# Patient Record
Sex: Female | Born: 1950
Health system: Southern US, Community
[De-identification: ages and names within clinical notes are randomized; demographics above are authoritative.]

## PROBLEM LIST (undated history)

## (undated) DIAGNOSIS — I4891 Unspecified atrial fibrillation: Secondary | ICD-10-CM

## (undated) DIAGNOSIS — I639 Cerebral infarction, unspecified: Secondary | ICD-10-CM

## (undated) DIAGNOSIS — R011 Cardiac murmur, unspecified: Secondary | ICD-10-CM

## (undated) DIAGNOSIS — E538 Deficiency of other specified B group vitamins: Secondary | ICD-10-CM

## (undated) DIAGNOSIS — M1711 Unilateral primary osteoarthritis, right knee: Secondary | ICD-10-CM

## (undated) DIAGNOSIS — E785 Hyperlipidemia, unspecified: Secondary | ICD-10-CM

## (undated) DIAGNOSIS — Z860100 Personal history of colon polyps, unspecified: Secondary | ICD-10-CM

## (undated) DIAGNOSIS — E611 Iron deficiency: Secondary | ICD-10-CM

## (undated) DIAGNOSIS — R42 Dizziness and giddiness: Secondary | ICD-10-CM

## (undated) DIAGNOSIS — D649 Anemia, unspecified: Secondary | ICD-10-CM

## (undated) DIAGNOSIS — K219 Gastro-esophageal reflux disease without esophagitis: Secondary | ICD-10-CM

## (undated) DIAGNOSIS — I10 Essential (primary) hypertension: Secondary | ICD-10-CM

## (undated) DIAGNOSIS — I6529 Occlusion and stenosis of unspecified carotid artery: Secondary | ICD-10-CM

## (undated) DIAGNOSIS — M1990 Unspecified osteoarthritis, unspecified site: Secondary | ICD-10-CM

## (undated) HISTORY — PX: TUBAL LIGATION: SHX77

## (undated) HISTORY — DX: Iron deficiency: E61.1

## (undated) HISTORY — DX: Unspecified atrial fibrillation: I48.91

## (undated) HISTORY — DX: Cerebral infarction, unspecified: I63.9

## (undated) HISTORY — PX: CAROTID ENDARTERECTOMY: SUR193

## (undated) HISTORY — DX: Hyperlipidemia, unspecified: E78.5

## (undated) HISTORY — DX: Occlusion and stenosis of unspecified carotid artery: I65.29

## (undated) HISTORY — PX: DILATION AND CURETTAGE OF UTERUS: SHX78

## (undated) HISTORY — PX: ESOPHAGEAL DILATION: SHX303

---

## 2005-01-15 ENCOUNTER — Ambulatory Visit: Payer: Self-pay | Admitting: Family Medicine

## 2005-10-10 ENCOUNTER — Ambulatory Visit: Payer: Self-pay | Admitting: Family Medicine

## 2005-10-25 ENCOUNTER — Ambulatory Visit: Payer: Self-pay | Admitting: Unknown Physician Specialty

## 2006-11-12 ENCOUNTER — Ambulatory Visit: Payer: Self-pay | Admitting: Unknown Physician Specialty

## 2007-11-13 ENCOUNTER — Ambulatory Visit: Payer: Self-pay | Admitting: Unknown Physician Specialty

## 2008-11-14 ENCOUNTER — Ambulatory Visit: Payer: Self-pay | Admitting: Internal Medicine

## 2009-02-03 ENCOUNTER — Ambulatory Visit: Payer: Self-pay | Admitting: Family Medicine

## 2010-04-25 ENCOUNTER — Ambulatory Visit: Payer: Self-pay | Admitting: Unknown Physician Specialty

## 2010-05-17 ENCOUNTER — Ambulatory Visit: Payer: Self-pay | Admitting: Unknown Physician Specialty

## 2011-08-21 ENCOUNTER — Ambulatory Visit: Payer: Self-pay | Admitting: Unknown Physician Specialty

## 2012-04-11 ENCOUNTER — Ambulatory Visit: Payer: Self-pay | Admitting: Otolaryngology

## 2012-05-02 ENCOUNTER — Ambulatory Visit: Payer: Self-pay | Admitting: Gastroenterology

## 2012-05-30 ENCOUNTER — Ambulatory Visit: Payer: Self-pay | Admitting: Gastroenterology

## 2012-08-18 ENCOUNTER — Ambulatory Visit: Payer: Self-pay | Admitting: Gastroenterology

## 2012-08-18 HISTORY — PX: UPPER GI ENDOSCOPY: SHX6162

## 2012-08-18 HISTORY — PX: COLONOSCOPY: SHX174

## 2012-08-19 LAB — PATHOLOGY REPORT

## 2012-12-31 ENCOUNTER — Emergency Department: Payer: Self-pay | Admitting: Emergency Medicine

## 2012-12-31 ENCOUNTER — Inpatient Hospital Stay (HOSPITAL_COMMUNITY): Payer: BC Managed Care – PPO

## 2012-12-31 ENCOUNTER — Encounter (HOSPITAL_COMMUNITY): Payer: Self-pay | Admitting: *Deleted

## 2012-12-31 ENCOUNTER — Inpatient Hospital Stay (HOSPITAL_COMMUNITY)
Admission: AD | Admit: 2012-12-31 | Discharge: 2013-01-02 | DRG: 014 | Disposition: A | Discharge status: Home / Self Care | Payer: BC Managed Care – PPO | Source: Other Acute Inpatient Hospital | Attending: Internal Medicine | Admitting: Internal Medicine

## 2012-12-31 DIAGNOSIS — I6930 Unspecified sequelae of cerebral infarction: Secondary | ICD-10-CM | POA: Insufficient documentation

## 2012-12-31 DIAGNOSIS — I6521 Occlusion and stenosis of right carotid artery: Secondary | ICD-10-CM

## 2012-12-31 DIAGNOSIS — I6350 Cerebral infarction due to unspecified occlusion or stenosis of unspecified cerebral artery: Secondary | ICD-10-CM | POA: Insufficient documentation

## 2012-12-31 DIAGNOSIS — I6529 Occlusion and stenosis of unspecified carotid artery: Secondary | ICD-10-CM | POA: Diagnosis present

## 2012-12-31 DIAGNOSIS — I10 Essential (primary) hypertension: Secondary | ICD-10-CM | POA: Insufficient documentation

## 2012-12-31 DIAGNOSIS — K219 Gastro-esophageal reflux disease without esophagitis: Secondary | ICD-10-CM | POA: Insufficient documentation

## 2012-12-31 DIAGNOSIS — Z823 Family history of stroke: Secondary | ICD-10-CM

## 2012-12-31 DIAGNOSIS — I639 Cerebral infarction, unspecified: Secondary | ICD-10-CM | POA: Insufficient documentation

## 2012-12-31 DIAGNOSIS — E785 Hyperlipidemia, unspecified: Secondary | ICD-10-CM | POA: Diagnosis present

## 2012-12-31 HISTORY — DX: Cerebral infarction, unspecified: I63.9

## 2012-12-31 HISTORY — DX: Cardiac murmur, unspecified: R01.1

## 2012-12-31 HISTORY — DX: Gastro-esophageal reflux disease without esophagitis: K21.9

## 2012-12-31 HISTORY — DX: Essential (primary) hypertension: I10

## 2012-12-31 HISTORY — DX: Unspecified osteoarthritis, unspecified site: M19.90

## 2012-12-31 LAB — CBC WITH DIFFERENTIAL/PLATELET
Eosinophils Absolute: 0.1 10*3/uL (ref 0.0–0.7)
Eosinophils Relative: 2 % (ref 0–5)
Hemoglobin: 12.3 g/dL (ref 12.0–15.0)
Lymphs Abs: 1.8 10*3/uL (ref 0.7–4.0)
MCH: 29.3 pg (ref 26.0–34.0)
MCV: 87.4 fL (ref 78.0–100.0)
Monocytes Relative: 7 % (ref 3–12)
Neutrophils Relative %: 64 % (ref 43–77)
RBC: 4.2 MIL/uL (ref 3.87–5.11)

## 2012-12-31 LAB — COMPREHENSIVE METABOLIC PANEL
Anion Gap: 6 — ABNORMAL LOW (ref 7–16)
Chloride: 105 mmol/L (ref 98–107)
Co2: 28 mmol/L (ref 21–32)
Creatinine: 0.79 mg/dL (ref 0.60–1.30)
EGFR (African American): >60
EGFR (Non-African Amer.): >60
Glucose: 93 mg/dL (ref 65–99)
SGPT (ALT): 19 U/L (ref 12–78)

## 2012-12-31 LAB — PROTIME-INR
INR: 1
Prothrombin Time: 13.2 secs (ref 11.5–14.7)

## 2012-12-31 LAB — CBC
HGB: 12.6 g/dL (ref 12.0–16.0)
MCHC: 33.7 g/dL (ref 32.0–36.0)
RBC: 4.28 10*6/uL (ref 3.80–5.20)

## 2012-12-31 MED ORDER — SENNOSIDES-DOCUSATE SODIUM 8.6-50 MG PO TABS: 1.0000 | ORAL_TABLET | Freq: Every evening | ORAL | Status: DC | PRN

## 2012-12-31 MED ORDER — SODIUM CHLORIDE 0.9 % IV SOLN
INTRAVENOUS | Status: DC
  Administered 2013-01-01: via INTRAVENOUS

## 2012-12-31 MED ORDER — ACETAMINOPHEN 325 MG PO TABS
650.0000 mg | ORAL_TABLET | ORAL | Status: DC | PRN
  Administered 2012-12-31: 650 mg via ORAL
  Filled 2012-12-31: qty 2

## 2012-12-31 MED ORDER — TRAMADOL HCL 50 MG PO TABS: 50.0000 mg | ORAL_TABLET | Freq: Three times a day (TID) | ORAL | Status: DC | PRN

## 2012-12-31 MED ORDER — CLOPIDOGREL BISULFATE 75 MG PO TABS
75.0000 mg | ORAL_TABLET | Freq: Every day | ORAL | Status: DC
  Administered 2013-01-01 – 2013-01-02 (×2): 75 mg via ORAL
  Filled 2012-12-31 (×4): qty 1

## 2012-12-31 MED ORDER — HYDRALAZINE HCL 20 MG/ML IJ SOLN: 10.0000 mg | INTRAMUSCULAR | Status: DC | PRN

## 2012-12-31 NOTE — Consult Note (Addendum)
Referring Physician: Toniann Fail    Chief Complaint: Intermittent left sided symptoms  HPI: Cumi Juhasz is an 62 y.o. female who reports that she awakened today normal.  When she went to the bathroom this morning after getting out of bed noted that she was numb on the left.  This lasted for a few minutes and resolved spontaneously. The patient went about her day and while at work at about 12 noon noted that she was unable to use her left hand normally.  This as well resolved after a few minutes.  At 2:30 she noted that she was dragging her left foot.  This lasted a little longer than the previous events.  She estimates that it lasted about 5 minutes but resolved as well.  The patient was unable to reach her PCP and presented to the ED at [facility] for evaluation.  Work up there included a MRI of the brain that showed an acute infarct.  Patient was transferred here for further evaluation.   Patient reports that for the past two days has had a dull frontal headache.  There has bee no associated nausea, vomiting, photophobia or phonophobia.    Date last known well: Date: 12/31/2012 Time last known well: Time: 14:30 tPA Given: No: Symptoms resolved  Past Medical History  Diagnosis Date  . Hypertension   . Heart murmur   . GERD (gastroesophageal reflux disease)   . Arthritis     right knee    Past Surgical History  Procedure Laterality Date  . Tubal ligation      Family History  Problem Relation Age of Onset  . Stroke Mother   . Heart failure Father    Social History:  reports that she has never smoked. She does not have any smokeless tobacco history on file. She reports that she does not use illicit drugs. Her alcohol history is not on file.  Allergies: No Known Allergies  Medications: Calcium, Losartan, Protonix, Vitamin D  ROS: History obtained from the patient  General ROS: negative for - chills, fatigue, fever, night sweats, weight gain or weight loss Psychological ROS:  negative for - behavioral disorder, hallucinations, memory difficulties, mood swings or suicidal ideation Ophthalmic ROS: negative for - blurry vision, double vision, eye pain or loss of vision ENT ROS: negative for - epistaxis, nasal discharge, oral lesions, sore throat, tinnitus or vertigo Allergy and Immunology ROS: negative for - hives or itchy/watery eyes Hematological and Lymphatic ROS: negative for - bleeding problems, bruising or swollen lymph nodes Endocrine ROS: negative for - galactorrhea, hair pattern changes, polydipsia/polyuria or temperature intolerance Respiratory ROS: excessive snoring Cardiovascular ROS: negative for - chest pain, dyspnea on exertion, edema or irregular heartbeat Gastrointestinal ROS: negative for - abdominal pain, diarrhea, hematemesis, nausea/vomiting or stool incontinence Genito-Urinary ROS: negative for - dysuria, hematuria, incontinence or urinary frequency/urgency Musculoskeletal ROS: joint pain Neurological ROS: as noted in HPI Dermatological ROS: negative for rash and skin lesion changes  Physical Examination: Blood pressure 151/56, pulse 72, temperature 98.6 F (37 C), temperature source Oral, resp. rate 18, SpO2 99.00%.  Neurologic Examination: Mental Status: Alert, oriented, thought content appropriate.  Speech fluent without evidence of aphasia.  Able to follow 3 step commands without difficulty. Cranial Nerves: II: Discs flat bilaterally; Visual fields grossly normal, pupils equal, round, reactive to light and accommodation III,IV, VI: ptosis not present, extra-ocular motions intact bilaterally V,VII: smile symmetric, facial light touch sensation normal bilaterally VIII: hearing normal bilaterally IX,X: gag reflex present XI: bilateral shoulder shrug XII:  midline tongue extension Motor: Right : Upper extremity   5/5    Left:     Upper extremity   5/5  Lower extremity   5/5     Lower extremity   5/5 Tone and bulk:normal tone throughout;  no atrophy noted Sensory: Pinprick and light touch intact throughout, bilaterally Deep Tendon Reflexes: 2+ in the upper extremities, absent at the knees and at the left ankle.  1+at the right ankle.   Plantars: Right: downgoing   Left: downgoing Cerebellar: normal finger-to-nose and normal heel-to-shin test Gait: normal gait and station CV: pulses palpable throughout     Laboratory Studies:  Lab work reviewed from [employer] and unremarkable.     Imaging: Head CT (12/31/2012) - No acute changes.  Hyperdensity seen in the 4th ventricle, likely calcification.  MRI of the brain (12/31/2012) - Right posterior parietal acute, nonhemorrhagic infarcts.  No evidence of hemorrhage.    Assessment: 62 y.o. female RH female with intermittent left sided symptoms today that have resolved.  MRI of the brain performed at [facility] and reviewed.  Acute infarcts noted in the right posterior parietal lobe.  With head CT there was some question raised of forth ventricular hemorrhage but after review this likely represents calcification.  Patient has a history of HTN but no other risk factors.  She is on no antiplatelet therapy at home.    Stroke Risk Factors - hypertension  Plan: 1. HgbA1c, fasting lipid panel 2. Repeat head CT on 7/17 3. PT consult, OT consult evaluation 4. Echocardiogram 5. Carotid dopplers 6. Prophylactic therapy-Antiplatelet med: Plavix - dose 75mg  daily 7. Risk factor modification 8. Telemetry monitoring 9. Frequent neuro checks 10. Ultram prn for headache   Thana Farr, MD [facility] [phone] 12/31/2012, 10:39 PM

## 2012-12-31 NOTE — H&P (Signed)
[facility] History and Physical  Rosslyn Daddario WUJ:[id number] DOB: 09-Aug-1950 DOA: 12/31/2012  Referring physician: Patient was transferred from [facility]. PCP: Default, Provider, MD   Chief Complaint: Left-sided weakness.  HPI: Ellen Baker is a 62 y.o. female history of hypertension and GERD started experiencing left lower extremity numbness around 7 AM after waking up which lasted for around half an hour and resolved. Then about 2 hours later patient started having weakness in the left upper extremity which lasted for a few minutes and resolved. Then around 2:30 PM patient started developing foot drop-like feeling in the left foot which made her drag her foot when walking. Around 4:30 PM patient had gone to [facility] and CT head done there was showing possibility of hemorrhagic in the fourth ventricle. MRI of the brain was done which was showing acute punctate infarcts involving the right posterior parietal lobe. Patient has been transferred to call for further management after discussing with neurologist as patient may need a repeat CT head to make sure there was no bleed and in case neurosurgery is necessary. Over the last 2 days patient has been having some frontal headaches but denies any visual symptoms, difficulty swallowing or speaking and presently patient is able to walk without any difficulty and has good strength in all extremities. Denies any chest pain shortness of breath nausea vomiting abdominal pain. Patient states she has frequent bowel movements for many years.  Review of Systems: As presented in the history of presenting illness, rest negative.  Past Medical History  Diagnosis Date  . Hypertension   . Heart murmur   . GERD (gastroesophageal reflux disease)   . Arthritis     right knee   Past Surgical History  Procedure Laterality Date  . Tubal ligation     Social History:  reports that she has never smoked. She does not have any  smokeless tobacco history on file. She reports that she does not use illicit drugs. Her alcohol history is not on file. Home. where does patient live-- Can do ADLs. Can patient participate in ADLs?  No Known Allergies  Family History  Problem Relation Age of Onset  . Stroke Mother   . Heart failure Father       Prior to Admission medications   Not on File   Physical Exam: Filed Vitals:   12/31/12 2117  BP: 151/56  Pulse: 72  Temp: 98.6 F (37 C)  TempSrc: Oral  Resp: 18  SpO2: 99%     General:  Well-developed and nourished.  Eyes: Anicteric no pallor.  ENT: No discharge from the ears eyes nose mouth.  Neck: No mass felt.  Cardiovascular: S1-S2 heard.   Respiratory: No rhonchi or crepitations.  Abdomen: Soft nontender bowel sounds present.  Skin: No rash.  Musculoskeletal: No edema.  Psychiatric: Appears normal.  Neurologic: Alert awake oriented to time place and person. Moves all extremities 5 x 5. No facial asymmetry. No tongue deviation. PERRLA positive.  Labs on Admission:  Basic Metabolic Panel: No results found for this basename: NA, K, CL, CO2, GLUCOSE, BUN, CREATININE, CALCIUM, MG, PHOS,  in the last 168 hours Liver Function Tests: No results found for this basename: AST, ALT, ALKPHOS, BILITOT, PROT, ALBUMIN,  in the last 168 hours No results found for this basename: LIPASE, AMYLASE,  in the last 168 hours No results found for this basename: AMMONIA,  in the last 168 hours CBC: No results found for this basename: WBC, NEUTROABS, HGB, HCT,  MCV, PLT,  in the last 168 hours Cardiac Enzymes: No results found for this basename: CKTOTAL, CKMB, CKMBINDEX, TROPONINI,  in the last 168 hours  BNP (last 3 results) No results found for this basename: PROBNP,  in the last 8760 hours CBG: No results found for this basename: GLUCAP,  in the last 168 hours  Radiological Exams on Admission: No results found.   Assessment/Plan Principal Problem:   CVA  (cerebral infarction) Active Problems:   HTN (hypertension)   GERD (gastroesophageal reflux disease)   1. CVA - I did discuss with on-call neurologist Dr. Thad Ranger. At this time patient has been placed on neurochecks swallow evaluation and 2-D echo and carotid Doppler. Repeat neurological exams per neurologist. Antiplatelet agents as per neurologist. 2. Hypertension - continue present medications. 3. GERD - continue present medications.  Repeat labs chest x-ray and EKG are pending. Monitor shows sinus rhythm. Labs done at [facility] were unremarkable.    Code Status: Full code.  Family Communication: Family at the bedside.  Disposition Plan: Admit to inpatient.    KAKRAKANDY,ARSHAD N. [facility] Pager [phone].  If 7PM-7AM, please contact night-coverage [email or web address] Password [facility] 12/31/2012, 10:12 PM

## 2013-01-01 ENCOUNTER — Inpatient Hospital Stay (HOSPITAL_COMMUNITY): Payer: BC Managed Care – PPO

## 2013-01-01 ENCOUNTER — Encounter (HOSPITAL_COMMUNITY): Payer: Self-pay | Admitting: Radiology

## 2013-01-01 DIAGNOSIS — I6350 Cerebral infarction due to unspecified occlusion or stenosis of unspecified cerebral artery: Principal | ICD-10-CM

## 2013-01-01 LAB — COMPREHENSIVE METABOLIC PANEL
AST: 17 U/L (ref 0–37)
Albumin: 3.1 g/dL — ABNORMAL LOW (ref 3.5–5.2)
Calcium: 9 mg/dL (ref 8.4–10.5)
Creatinine, Ser: 0.69 mg/dL (ref 0.50–1.10)

## 2013-01-01 LAB — LIPID PANEL
Cholesterol: 201 mg/dL — ABNORMAL HIGH (ref 0–200)
HDL: 56 mg/dL (ref >39)
LDL Cholesterol: 126 mg/dL — ABNORMAL HIGH (ref 0–99)
Total CHOL/HDL Ratio: 3.6 RATIO
Triglycerides: 94 mg/dL (ref <150)
VLDL: 19 mg/dL (ref 0–40)

## 2013-01-01 LAB — CBC WITH DIFFERENTIAL/PLATELET
Eosinophils Absolute: 0.1 10*3/uL (ref 0.0–0.7)
Eosinophils Relative: 3 % (ref 0–5)
HCT: 33.8 % — ABNORMAL LOW (ref 36.0–46.0)
Hemoglobin: 11.5 g/dL — ABNORMAL LOW (ref 12.0–15.0)
Lymphs Abs: 1.7 10*3/uL (ref 0.7–4.0)
MCH: 29.6 pg (ref 26.0–34.0)
MCV: 87.1 fL (ref 78.0–100.0)
Monocytes Absolute: 0.4 10*3/uL (ref 0.1–1.0)
Monocytes Relative: 9 % (ref 3–12)
Platelets: 191 10*3/uL (ref 150–400)
RBC: 3.88 MIL/uL (ref 3.87–5.11)

## 2013-01-01 LAB — BASIC METABOLIC PANEL
CO2: 25 mEq/L (ref 19–32)
Calcium: 8.7 mg/dL (ref 8.4–10.5)
Chloride: 107 mEq/L (ref 96–112)
Glucose, Bld: 92 mg/dL (ref 70–99)
Sodium: 141 mEq/L (ref 135–145)

## 2013-01-01 MED ORDER — SIMVASTATIN 10 MG PO TABS
10.0000 mg | ORAL_TABLET | Freq: Every day | ORAL | Status: DC
  Administered 2013-01-01: 10 mg via ORAL
  Filled 2013-01-01 (×2): qty 1

## 2013-01-01 MED ORDER — IOHEXOL 350 MG/ML SOLN
50.0000 mL | Freq: Once | INTRAVENOUS | Status: AC | PRN
  Administered 2013-01-01: 50 mL via INTRAVENOUS

## 2013-01-01 NOTE — Evaluation (Signed)
Physical Therapy Evaluation Patient Details Name: Ellen Baker MRN: [id number] DOB: 10/18/1950 Today's Date: 01/01/2013 Time: 5621-3086 PT Time Calculation (min): 29 min  PT Assessment / Plan / Recommendation History of Present Illness  62 y.o. female hx HTN and GERD started experiencing left LE numbness around 7 AM after waking up which lasted for around half an hour and resolved. Then about 2 hours later patient started having weakness in the left upper extremity which lasted for a few minutes and resolved. Then around 2:30 PM patient started developing foot drop-like feeling in the left foot which made her drag her foot when walking. Around 4:30 PM patient had gone to [facility] and CT  showing possibility of hemorrhagic in the fourth ventricle. MRI  showing acute punctate infarcts involving the right posterior parietal lobe. Transfered to [facility]. Patient reports that for the past two days has had a dull frontal headache  Clinical Impression  Presents to PT today back to baseline with minimal c/o feeling "wierd." No difficulty with mobility other than baseline arthritic knee (right) causing some antalgia with gait. Long discussion about how to develop and exercise program. Education provided on aquatics to limit pain in her knee. Education also provided on sxs of CVA and importance of diet and exercise as a lifestyle change to prevent future strokes. No further PT needs at this time.     PT Assessment  Patent does not need any further PT services    Follow Up Recommendations  No PT follow up    Does the patient have the potential to tolerate intense rehabilitation      Barriers to Discharge        Equipment Recommendations  None recommended by PT    Recommendations for Other Services     Frequency      Precautions / Restrictions Precautions Precautions: None Restrictions Weight Bearing Restrictions: No   Pertinent Vitals/Pain Denies pain      Mobility  Bed Mobility Bed Mobility: Not  assessed Transfers Transfers: Sit to Stand;Stand to Sit Sit to Stand: 7: Independent Stand to Sit: 7: Independent Ambulation/Gait Ambulation/Gait Assistance: 7: Independent Ambulation Distance (Feet): 600 Feet Assistive device: None Gait Pattern: Antalgic General Gait Details: left knee arthritis causing some antalgia Stairs: Yes Stairs Assistance: 6: Modified independent (Device/Increase time) Stair Management Technique: One rail Right Number of Stairs: 2 Modified Rankin (Stroke Patients Only) Pre-Morbid Rankin Score: No symptoms Modified Rankin: No significant disability        PT Goals(Current goals can be found in the care plan section) Acute Rehab PT Goals PT Goal Formulation: No goals set, d/c therapy  Visit Information  Last PT Received On: 01/01/13 History of Present Illness: 62 y.o. female hx HTN and GERD started experiencing left LE numbness around 7 AM after waking up which lasted for around half an hour and resolved. Then about 2 hours later patient started having weakness in the left upper extremity which lasted for a few minutes and resolved. Then around 2:30 PM patient started developing foot drop-like feeling in the left foot which made her drag her foot when walking. Around 4:30 PM patient had gone to [facility] and CT  showing possibility of hemorrhagic in the fourth ventricle. MRI  showing acute punctate infarcts involving the right posterior parietal lobe. Transfered to [facility]. Patient reports that for the past two days has had a dull frontal headache       Prior Functioning  Home Living Family/patient expects to be discharged to:: Private residence Living Arrangements:  Alone Available Help at Discharge: Family;Available PRN/intermittently Type of Home: House Home Access: Stairs to enter [employer] of Steps: 2 Entrance Stairs-Rails: Right Home Layout: One level Home Equipment: None Prior Function Level of Independence:  Independent Communication Communication: No difficulties    Cognition  Cognition Arousal/Alertness: Awake/alert Behavior During Therapy: WFL for tasks assessed/performed Overall Cognitive Status: Within Functional Limits for tasks assessed    Extremity/Trunk Assessment Upper Extremity Assessment Upper Extremity Assessment: Overall WFL for tasks assessed Lower Extremity Assessment Lower Extremity Assessment: Overall WFL for tasks assessed;LLE deficits/detail LLE Deficits / Details: arthritic knee LLE: Unable to fully assess due to pain   Balance Standardized Balance Assessment Standardized Balance Assessment: Dynamic Gait Index Dynamic Gait Index Level Surface: Normal Change in Gait Speed: Mild Impairment Gait with Horizontal Head Turns: Normal Gait with Vertical Head Turns: Normal Gait and Pivot Turn: Normal Step Over Obstacle: Mild Impairment Step Around Obstacles: Normal Steps: Mild Impairment Total Score: 21  End of Session PT - End of Session Equipment Utilized During Treatment: Gait belt Activity Tolerance: Patient tolerated treatment well Patient left: in chair;with call bell/phone within reach Nurse Communication: Mobility status  GP     [facility] HELEN 01/01/2013, 9:38 AM

## 2013-01-01 NOTE — Progress Notes (Signed)
Echocardiogram 2D Echocardiogram has been performed.  Ellen Baker 01/01/2013, 1:26 PM

## 2013-01-01 NOTE — Progress Notes (Signed)
CT called to report results. Paged Dr. Imogene Burn with no response.

## 2013-01-01 NOTE — Progress Notes (Signed)
SLP Cancellation Note  Patient Details Name: Abbeygail Igoe MRN: [id number] DOB: [birth year]   Cancelled treatment:       Reason Eval/Treat Not Completed: Other (comment) (screen). Pt observed working with PT, participating in high level cognitive linguistic tasks without difficulty. Will defer full eval and sign off.    Harlon Ditty, MA CCC-SLP [phone]  Claudine Mouton 01/01/2013, 11:06 AM

## 2013-01-01 NOTE — Progress Notes (Signed)
VASCULAR LAB PRELIMINARY  PRELIMINARY  PRELIMINARY  PRELIMINARY  Carotid duplex  completed.    Preliminary report:  Right:  Greater than 80% internal carotid artery stenosis.  Left:  Less than 39% ICA stenosis.  Bilateral:  Vertebral artery flow is antegrade.     Joyce Leckey, RVT 01/01/2013, 12:31 PM

## 2013-01-01 NOTE — Progress Notes (Signed)
Stroke Team Progress Note  HISTORY HPI: Newell Mathison is an 62 y.o. female who reports that she awakened today normal. When she went to the bathroom this morning after getting out of bed noted that she was numb on the left. This lasted for a few minutes and resolved spontaneously. The patient went about her day and while at work at about 12 noon noted that she was unable to use her left hand normally. This as well resolved after a few minutes. At 2:30 she noted that she was dragging her left foot. This lasted a little longer than the previous events. She estimates that it lasted about 5 minutes but resolved as well. The patient was unable to reach her PCP and presented to the ED at [facility] for evaluation. Work up there included a MRI of the brain that showed an acute infarct. Patient was transferred here for further evaluation.   Patient reports that for the past two days has had a dull frontal headache. There has bee no associated nausea, vomiting, photophobia or phonophobia.   Date last known well: Date: 12/31/2012  Time last known well: Time: 14:30  tPA Given: No: Symptoms resolved   SUBJECTIVE  Patient lying in bed. Says that she is still weak, but has improved overnight.    OBJECTIVE Most recent Vital Signs: Filed Vitals:   12/31/12 2345 01/01/13 0116 01/01/13 0312 01/01/13 0527  BP: 143/52 125/50 117/60 121/55  Pulse: 60 55 55 56  Temp: 97.8 F (36.6 C)  97.9 F (36.6 C) 97.6 F (36.4 C)  TempSrc: Oral  Oral Oral  Resp: 20 20 18 18   Height:      Weight:      SpO2: 98% 98% 97% 100%   CBG (last 3)  No results found for this basename: GLUCAP,  in the last 72 hours  IV Fluid Intake:     MEDICATIONS  . clopidogrel  75 mg Oral Q breakfast  . simvastatin  10 mg Oral q1800   PRN:  acetaminophen, hydrALAZINE, senna-docusate, traMADol  Diet:  Cardiac thin liquids Activity:  Out of bed DVT Prophylaxis:  SCD  CLINICALLY SIGNIFICANT STUDIES Basic Metabolic Panel:  Recent  Labs Lab 12/31/12 2255 01/01/13 0605  NA 142 141  K 3.5 3.7  CL 106 107  CO2 29 25  GLUCOSE 108* 92  BUN 9 10  CREATININE 0.69 0.72  CALCIUM 9.0 8.7   Liver Function Tests:  Recent Labs Lab 12/31/12 2255  AST 17  ALT 12  ALKPHOS 72  BILITOT 0.6  PROT 6.8  ALBUMIN 3.1*   CBC:  Recent Labs Lab 12/31/12 2255 01/01/13 0605  WBC 6.7 4.5  NEUTROABS 4.3 2.2  HGB 12.3 11.5*  HCT 36.7 33.8*  MCV 87.4 87.1  PLT 221 191   Coagulation: No results found for this basename: LABPROT, INR,  in the last 168 hours Cardiac Enzymes:  Recent Labs Lab 12/31/12 2255  TROPONINI <0.30   Urinalysis: No results found for this basename: COLORURINE, APPERANCEUR, LABSPEC, PHURINE, GLUCOSEU, HGBUR, BILIRUBINUR, KETONESUR, PROTEINUR, UROBILINOGEN, NITRITE, LEUKOCYTESUR,  in the last 168 hours Lipid Panel    Component Value Date/Time   CHOL 201* 01/01/2013 0605   TRIG 94 01/01/2013 0605   HDL 56 01/01/2013 0605   CHOLHDL 3.6 01/01/2013 0605   VLDL 19 01/01/2013 0605   LDLCALC 126* 01/01/2013 0605   HgbA1C 5.3  Urine Drug Screen:   No results found for this basename: labopia, cocainscrnur, labbenz, amphetmu, thcu, labbarb  Alcohol Level: No results found for this basename: ETH,  in the last 168 hours  Dg Chest 2 View 01/01/2013 Minimal left basilar airspace opacity likely reflects atelectasis; lungs otherwise clear.   CT of the brain   MRI of the brain  Reviewed by Dr. Thad Ranger from [facility] showed acute infarcts of right posterior parietal lobe.  MRA of the brain    2D Echocardiogram    Carotid Doppler Right: Greater than 80% internal carotid artery stenosis. Left: Less than 39% ICA stenosis. Bilateral: Vertebral artery flow is antegrade  EKG    Therapy Recommendations no PT/OT followup  Physical Exam    Neurologic Examination:  Mental Status:  Alert, oriented, thought content appropriate. Speech fluent without evidence of aphasia. Able to follow 3 step commands without  difficulty.  Cranial Nerves:  II: Discs flat bilaterally; Visual fields grossly normal, pupils equal, round, reactive to light and accommodation  III,IV, VI: ptosis not present, extra-ocular motions intact bilaterally  V,VII: smile symmetric, facial light touch sensation normal bilaterally  VIII: hearing normal bilaterally  IX,X: gag reflex present  XI: bilateral shoulder shrug  XII: midline tongue extension  Motor:  Right : Upper extremity 5/5 Left: Upper extremity 5/5  Lower extremity 5/5 Lower extremity 5/5  Tone and bulk:normal tone throughout; no atrophy noted  Sensory: Pinprick and light touch intact throughout, bilaterally  Plantars:  Right: downgoing Left: downgoing  Cerebellar:  normal finger-to-nose and normal heel-to-shin test  Gait: normal gait and station  CV: pulses palpable throughout     ASSESSMENT Ms. Synethia Endicott is a 62 y.o. female presenting with intermittent left hemiparesis. Imaging confirms acute infarcts of right posterior parietal lobe in watershed distribution. Infarct felt to be embolic secondary to unknown source.  On no antithrombotics prior to admission. Now on clopidogrel 75 mg orally every day for secondary stroke prevention. Patient with resultant left hemiparesis. Work up underway.   Hypertension  Hyperlipidemia, LDL 126, goal < 100 in nondiabetic patients, started on zocor  Family history of stroke  Long term medication use  Right internal carotid artery stenosis, symptomatic  Hospital day # 1  TREATMENT/PLAN  Continue clopidogrel 75 mg orally every day for secondary stroke prevention.  hgb a1c pending  2D Echo pending  Vascular consult, I have called Dr. Imogene Burn.  Avoid protonix with plavix use as it decreases effectiveness. Zantac as option.  Gwendolyn Lima. Manson Passey, PAC, MBA, MHA [facility] Pager: [phone] 01/01/2013 10:00 AM  I have personally obtained a history, examined the patient, evaluated imaging results, and  formulated the assessment and plan of care. I agree with the above. Delia Heady, MD

## 2013-01-01 NOTE — Progress Notes (Signed)
[facility] PROGRESS NOTE  Ellen Baker AVW:[id number] DOB: May 02, 1951 DOA: 12/31/2012 PCP: Default, Provider, MD  Assessment/Plan: 1. CVA-R parietal infarcts-noted on MRI at [place] -with Calcification vs Hemorrhage in fourth Ventricle -for repeat CT head today  -2D ECHO/Carotid duplex pending -continue plavix -LDL 124: start low dose statin -hbaic pending  2. HTN:  -stable, losartan on hold  3. GERD -continue protonix  Code Status:FULL Family Communication: none at bedside Disposition Plan: home later today or in am   Consultants:  Neurology    HPI/Subjective: Feels ok, headache better  Objective: Filed Vitals:   12/31/12 2345 01/01/13 0116 01/01/13 0312 01/01/13 0527  BP: 143/52 125/50 117/60 121/55  Pulse: 60 55 55 56  Temp: 97.8 F (36.6 C)  97.9 F (36.6 C) 97.6 F (36.4 C)  TempSrc: Oral  Oral Oral  Resp: 20 20 18 18   Height:      Weight:      SpO2: 98% 98% 97% 100%   No intake or output data in the 24 hours ending 01/01/13 0824 Filed Weights   12/31/12 2300  Weight: 97.523 kg (215 lb)    Exam:   General:  AAOx3  Cardiovascular: S1S2/RRR, diastolic murmur  Respiratory: CTAB  Abdomen: soft, NT,. BS present  Musculoskeletal: no edema c/c  NEuro: non focal   Data Reviewed: Basic Metabolic Panel:  Recent Labs Lab 12/31/12 2255 01/01/13 0605  NA 142 141  K 3.5 3.7  CL 106 107  CO2 29 25  GLUCOSE 108* 92  BUN 9 10  CREATININE 0.69 0.72  CALCIUM 9.0 8.7   Liver Function Tests:  Recent Labs Lab 12/31/12 2255  AST 17  ALT 12  ALKPHOS 72  BILITOT 0.6  PROT 6.8  ALBUMIN 3.1*   No results found for this basename: LIPASE, AMYLASE,  in the last 168 hours No results found for this basename: AMMONIA,  in the last 168 hours CBC:  Recent Labs Lab 12/31/12 2255 01/01/13 0605  WBC 6.7 4.5  NEUTROABS 4.3 2.2  HGB 12.3 11.5*  HCT 36.7 33.8*  MCV 87.4 87.1  PLT 221 191   Cardiac Enzymes:  Recent Labs Lab  12/31/12 2255  TROPONINI <0.30   BNP (last 3 results) No results found for this basename: PROBNP,  in the last 8760 hours CBG: No results found for this basename: GLUCAP,  in the last 168 hours  No results found for this or any previous visit (from the past 240 hour(s)).   Studies: Dg Chest 2 View  01/01/2013   *RADIOLOGY REPORT*  Clinical Data: Shortness of breath; CVA.  CHEST - 2 VIEW  Comparison: None.  Findings: The lungs are well-aerated.  Minimal left basilar opacity likely reflects atelectasis.  There is no evidence of pleural effusion or pneumothorax.  The heart is normal in size; the mediastinal contour is within normal limits.  No acute osseous abnormalities are seen.  IMPRESSION: Minimal left basilar airspace opacity likely reflects atelectasis; lungs otherwise clear.   Original Report Authenticated By: Tonia Ghent, M.D.    Scheduled Meds: . clopidogrel  75 mg Oral Q breakfast  . simvastatin  10 mg Oral q1800   Continuous Infusions:   Principal Problem:   CVA (cerebral infarction) Active Problems:   HTN (hypertension)   GERD (gastroesophageal reflux disease)    Time spent:    [facility]  [facility] Pager [phone]. If 7PM-7AM, please contact night-coverage at [email or web address], password [facility] 01/01/2013, 8:24 AM  LOS: 1 day

## 2013-01-01 NOTE — Progress Notes (Signed)
   CARE MANAGEMENT NOTE 01/01/2013  Patient:  Ellen Baker,Ellen Baker   Account Number:  [id number]  Date Initiated:  01/01/2013  Documentation initiated by:  Jiles Crocker  Subjective/Objective Assessment:   ADMITTED WITH LEFT SIDED WEAKNESS     Action/Plan:   LIVES AT HOME ALON; STROKE WORKUP IN PROGRESS; CM FOLLOWING FOR DCP   Anticipated DC Date:  01/07/2013   Anticipated DC Plan:  HOME W HOME HEALTH SERVICES      DC Planning Services  CM consult          Status of service:  In process, will continue to follow Medicare Important Message given?  NA - LOS <3 / Initial given by admissions (If response is "NO", the following Medicare IM given date fields will be blank)  Per UR Regulation:  Reviewed for med. necessity/level of care/duration of stay  Comments:  01/01/2013- B Aws Shere RN,BSN,MHA

## 2013-01-01 NOTE — Progress Notes (Addendum)
Occupational Therapy Discharge Patient Details Name: Ellen Baker MRN: [id number] DOB: 05-12-1951 Today's Date: 01/01/2013 Time:  -     Patient discharged from OT services secondary to goals met and no further OT needs identified.  Please see latest therapy progress note for current level of functioning and progress toward goals.    Progress and discharge plan discussed with patient and/or caregiver: Patient/Caregiver agrees with plan   Pt reading paper, ambulating in the room and demonstrates fine motor turning pages to newspaper. Pt is at or near baseline.  Pt asking if she can take a tub bath and ensured it is okay to do so upon d/c     Lucile Shutters Pager: [phone]  01/01/2013, 10:01 AM

## 2013-01-01 NOTE — Consult Note (Signed)
[facility] [place]  New Carotid Patient  Referred by:  [facility]  Reason for referral: R internal carotid stenosis  History of Present Illness  Ellen Baker is a 62 y.o. ([birth year]) female who presents with chief complaint: stroke.  The patient was transferred from [facility] with known R parietal stroke on MRI.  Concerns for a 4th ventricle hemorrhage were also raised, leading to transfer for neurosurgical evaluation.  The details a history consistent with TIA consistent of numbness in LUE x 2 and LLE with development of motor weakness in LLE with foot drag.  The patient has never had amaurosis fugax or monocular blindness.  The patient has never had facial drooping or hemiplegia.  The patient has never had receptive or expressive aphasia.   The patient's previous neurologic deficits have resolved.  The patient's risks factors for carotid disease include: HTN, family history of stroke.  Past Medical History  Diagnosis Date  . Hypertension   . Heart murmur   . GERD (gastroesophageal reflux disease)   . Arthritis     right knee    Past Surgical History  Procedure Laterality Date  . Tubal ligation      History   Social History  . Marital Status: Unknown    Spouse Name: N/A    Number of Children: N/A  . Years of Education: N/A   Occupational History  . Not on file.   Social History Main Topics  . Smoking status: Never Smoker   . Smokeless tobacco: Not on file  . Alcohol Use: Not on file  . Drug Use: No  . Sexually Active: Not on file   Other Topics Concern  . Not on file   Social History Narrative  . No narrative on file    Family History  Problem Relation Age of Onset  . Stroke Mother   . Heart failure Father    Med 1. Tylenol 2. Plavix 3. Hydralazine 4. Senokot 5. Zocor 6. Ultram  No Known Allergies  ROS: [x]  Positive  [ ]  Denies    General: [ ]  Weight loss, [ ]  Fever, [ ]  chills  Neurologic: [ ]  Dizziness,  [x]  Blackouts, [ ]  Seizure, [ ]  Stroke, [x]  "Mini stroke", [ ]  Slurred speech, [ ]  Temporary blindness; [ ]  weakness in arms or legs, [ ]  Hoarseness  Cardiac: [ ]  Chest pain/pressure, [ ]  Shortness of breath at rest [ ]  Shortness of breath with exertion, [ ]  Atrial fibrillation or irregular heartbeat  Vascular: [ ]  Pain in legs with walking, [ ]  Pain in legs at rest, [ ]  Pain in legs at night,  [ ]  Non-healing ulcer, [ ]  Blood clot in vein/DVT,    Pulmonary: [ ]  Home oxygen, [ ]  Productive cough, [ ]  Coughing up blood, [ ]  Asthma, [ ]  Wheezing  Musculoskeletal:  [x]  Arthritis, [ ]  Low back pain, [ ]  Joint pain  Hematologic: [ ]  Easy Bruising, [ ]  Anemia; [ ]  Hepatitis  Gastrointestinal: [ ]  Blood in stool, [ ]  Gastroesophageal Reflux/heartburn, [x]  Trouble swallowing due to esophageal stricture  Urinary: [ ]  chronic Kidney disease, [ ]  on HD - [ ]  MWF or [ ]  TTHS, [ ]  Burning with urination, [ ]  Difficulty urinating  Skin: [ ]  Rashes, [ ]  Wounds  Psychological: [ ]  Anxiety, [ ]  Depression  For VQI Use Only  PRE-ADM LIVING: Home  AMB STATUS: Ambulatory  CAD Sx: None  PRIOR CHF: None  STRESS TEST: [x]   No, [ ]  Normal, [ ]  + ischemia, [ ]  + MI, [ ]  Both  Physical Examination  Filed Vitals:   01/01/13 0312 01/01/13 0527 01/01/13 1010 01/01/13 1409  BP: 117/60 121/55 142/67 156/66  Pulse: 55 56 65 65  Temp: 97.9 F (36.6 C) 97.6 F (36.4 C) 97.9 F (36.6 C) 97.6 F (36.4 C)  TempSrc: Oral Oral Oral Oral  Resp: 18 18 18 20   Height:      Weight:      SpO2: 97% 100% 98% 100%   Body mass index is 36.89 kg/(m^2).  General: A&O x 3, WD, Obese  Head: [place]/AT  Ear/Nose/Throat: Hearing grossly intact, nares w/o erythema or drainage, oropharynx w/o Erythema/Exudate, Mallampati score: 3  Eyes: PERRLA, EOMI  Neck: Supple, no nuchal rigidity, no palpable LAD  Pulmonary: Sym exp, good air movt, CTAB, no rales, rhonchi, & wheezing  Cardiac: RRR, Nl S1, S2, no Murmurs, rubs or  gallops  Vascular: Vessel Right Left  Radial Palpable Palpable  Brachial Palpable Palpable  Carotid Palpable, without bruit Palpable, without bruit  Aorta Not palpable N/A  Femoral Palpable Palpable  Popliteal Not palpable Not palpable  PT Palpable Palpable  DP Palpable Palpable   Gastrointestinal: soft, NTND, -G/R, - HSM, - masses, - CVAT B  Musculoskeletal: M/S 5/5 throughout , Extremities without ischemic changes   Neurologic: CN 2-12 intact , Pain and light touch intact in extremities , Motor exam as listed above  Psychiatric: Judgment intact, Mood & affect appropriate for pt's clinical situation  Dermatologic: See M/S exam for extremity exam, no rashes otherwise noted  Lymph : No Cervical, Axillary, or Inguinal lymphadenopathy   Laboratory: CBC:    Component Value Date/Time   WBC 4.5 01/01/2013 0605   RBC 3.88 01/01/2013 0605   HGB 11.5* 01/01/2013 0605   HCT 33.8* 01/01/2013 0605   PLT 191 01/01/2013 0605   MCV 87.1 01/01/2013 0605   MCH 29.6 01/01/2013 0605   MCHC 34.0 01/01/2013 0605   RDW 13.0 01/01/2013 0605   LYMPHSABS 1.7 01/01/2013 0605   MONOABS 0.4 01/01/2013 0605   EOSABS 0.1 01/01/2013 0605   BASOSABS 0.0 01/01/2013 0605    BMP:    Component Value Date/Time   NA 141 01/01/2013 0605   K 3.7 01/01/2013 0605   CL 107 01/01/2013 0605   CO2 25 01/01/2013 0605   GLUCOSE 92 01/01/2013 0605   BUN 10 01/01/2013 0605   CREATININE 0.72 01/01/2013 0605   CALCIUM 8.7 01/01/2013 0605   GFRNONAA >90 01/01/2013 0605   GFRAA >90 01/01/2013 0605    Coagulation: No results found for this basename: INR, PROTIME   No results found for this basename: PTT    Radiology: Dg Chest 2 View  01/01/2013   *RADIOLOGY REPORT*  Clinical Data: Shortness of breath; CVA.  CHEST - 2 VIEW  Comparison: None.  Findings: The lungs are well-aerated.  Minimal left basilar opacity likely reflects atelectasis.  There is no evidence of pleural effusion or pneumothorax.  The heart is normal in size;  the mediastinal contour is within normal limits.  No acute osseous abnormalities are seen.  IMPRESSION: Minimal left basilar airspace opacity likely reflects atelectasis; lungs otherwise clear.   Original Report Authenticated By: Tonia Ghent, M.D.   Ct Head Wo Contrast  01/01/2013   *RADIOLOGY REPORT*  Clinical Data: Episodic left-sided numbness.  CT HEAD WITHOUT CONTRAST  Technique:  Contiguous axial images were obtained from the base of the skull through the vertex without  contrast.  Comparison: [facility] brain 12/31/2012.  Findings:  No intracranial hemorrhage or mass lesion.  Asymmetric hypodensity affects the right parietal cortex (image 20 series 2) consistent with early cytotoxic edema.  Other punctate foci of acute infarction noted on MR not well seen.  Mild atrophy and chronic microvascular ischemic change.  Remote periventricular lacunar infarct left centrum semiovale.   Calvarium intact.  Negative sinuses and mastoids.  Vascular calcification affects the carotid siphons.  No CT signs of proximal vascular thrombosis.  IMPRESSION: Asymmetric hypodensity affects the right parietal cortex consistent with early cytotoxic edema.  No associated hemorrhage.   Original Report Authenticated By: Davonna Belling, M.D.    Non-Invasive Vascular Imaging  CAROTID DUPLEX(Date: 01/01/2013):   R ICA stenosis: 80-99%  R VA: patent and antegrade  L ICA stenosis: 1-39%  L VA: patent and antegrade  Report finalized by myself.  Medical Decision Making  Ciara Kagan is a 62 y.o. female who presents with: R side acute CVA , possible R ICA stenosis >80%   I would recommend confirmation of the R ICA stenosis with a CTA neck, which I will order.  If the CTA confirms R ICA stenosis > 70%, I recommend a R CEA in 2-4 weeks.  While the patient is admit, I would obtain cardiology consultation for risk stratification and preoperative optimization.  I discussed in depth with the patient the nature of  atherosclerosis, and emphasized the importance of maximal medical management including strict control of blood pressure, blood glucose, and lipid levels, obtaining regular exercise, antiplatelet agents, and cessation of smoking.    The patient is currently on an antiplatelet: Plavix .    The patient is currently on a statin: Zocor.    The patient is aware that without maximal medical management the underlying atherosclerotic disease process will progress, limiting the benefit of any interventions.  Thank you for allowing [place] to participate in this patient's care.  Leonides Sake, MD [facility] [place] Office: [phone] Pager: [phone]  01/01/2013, 5:59 PM

## 2013-01-02 ENCOUNTER — Telehealth: Payer: Self-pay | Admitting: *Deleted

## 2013-01-02 ENCOUNTER — Telehealth: Payer: Self-pay | Admitting: Vascular Surgery

## 2013-01-02 DIAGNOSIS — Z01818 Encounter for other preprocedural examination: Secondary | ICD-10-CM

## 2013-01-02 DIAGNOSIS — I6529 Occlusion and stenosis of unspecified carotid artery: Secondary | ICD-10-CM

## 2013-01-02 DIAGNOSIS — Z01810 Encounter for preprocedural cardiovascular examination: Secondary | ICD-10-CM

## 2013-01-02 MED ORDER — CLOPIDOGREL BISULFATE 75 MG PO TABS: 75.0000 mg | ORAL_TABLET | Freq: Every day | ORAL | Status: DC

## 2013-01-02 MED ORDER — UNABLE TO FIND: Status: DC

## 2013-01-02 MED ORDER — SIMVASTATIN 10 MG PO TABS: 10.0000 mg | ORAL_TABLET | Freq: Every day | ORAL | Status: DC

## 2013-01-02 MED ORDER — FAMOTIDINE 40 MG PO TABS: 40.0000 mg | ORAL_TABLET | Freq: Every day | ORAL | Status: DC

## 2013-01-02 NOTE — Telephone Encounter (Addendum)
Message copied by Rosalyn Charters on Fri Jan 02, 2013 10:37 AM ------      Message from: Melene Plan      Created: Fri Jan 02, 2013  9:53 AM                   ----- Message -----         From: Fransisco Hertz, MD         Sent: 01/02/2013   7:58 AM           To: Melene Plan, RN            Akaya Certain      [phone]      10/07/1950            Pt needs follow up on AUG 1st and schedule for R CEA on Aug 4th ------  notified pt. of fu appt. on 01-16-13 at 11:15 with dr. Imogene Burn

## 2013-01-02 NOTE — Progress Notes (Addendum)
[facility] [place]  Daily Progress Note  Assessment/Planning:  Sx R ICA stenosis > 70%: per NASCET indications for R CEA, would time procedure in 2-4 weeks to avoid extending extent of infarction in brain with hypotension associated with surgery  Would obtain Cardiology evaluation while admitted to expedite preop work-up.  I do not take patient to OR for CEA without preop cardiology work-up  I briefly discussed the procedure with the patient.  She will follow up in 2 weeks for preop counseling.  Maximal medical mgmt already in process  Subjective    No events overnight  Objective Filed Vitals:   01/01/13 0312 01/01/13 0527 01/01/13 1010 01/01/13 1409  BP: 117/60 121/55 142/67 156/66  Pulse: 55 56 65 65  Temp: 97.9 F (36.6 C) 97.6 F (36.4 C) 97.9 F (36.6 C) 97.6 F (36.4 C)  TempSrc: Oral Oral Oral Oral  Resp: 18 18 18 20   Height:      Weight:      SpO2: 97% 100% 98% 100%   No intake or output data in the 24 hours ending 01/02/13 0716  PULM  CTAB CV  RRR GI  soft, NTND NEURO exam unchg  Laboratory CBC    Component Value Date/Time   WBC 4.5 01/01/2013 0605   HGB 11.5* 01/01/2013 0605   HCT 33.8* 01/01/2013 0605   PLT 191 01/01/2013 0605    BMET    Component Value Date/Time   NA 141 01/01/2013 0605   K 3.7 01/01/2013 0605   CL 107 01/01/2013 0605   CO2 25 01/01/2013 0605   GLUCOSE 92 01/01/2013 0605   BUN 10 01/01/2013 0605   CREATININE 0.72 01/01/2013 0605   CALCIUM 8.7 01/01/2013 0605   GFRNONAA >90 01/01/2013 0605   GFRAA >90 01/01/2013 8295    Leonides Sake, MD [facility] [place] Office: [phone] Pager: [phone]  01/02/2013, 7:16 AM   Per Dr. Imogene Burn - pt may return to work next week from vascular standpoint. F/U August 1st at our office/followed by surgery the following week if cleared by card [facility] to see today for pre-op clearance

## 2013-01-02 NOTE — Discharge Summary (Signed)
Physician Discharge Summary  Ellen Baker AOZ:[id number] DOB: 11-25-1950 DOA: 12/31/2012  PCP: Default, Provider, MD  Admit date: 12/31/2012 Discharge date: 01/02/2013  Time spent: 50 minutes  Recommendations for Outpatient Follow-up:  1. Myoview, [place] [facility] on 7/22 2. [facility] Dr.CHen in 2 weeks 3. Dr.Sethi in 1 month  Discharge Diagnoses:  Principal Problem:   CVA (cerebral infarction) Active Problems:   HTN (hypertension)   GERD (gastroesophageal reflux disease)   90% R Carotid artery stenosis    Dyslipidemia   Discharge Condition: stable  Diet recommendation: heart healthy  Filed Weights   12/31/12 2300  Weight: 97.523 kg (215 lb)    History of present illness:  Ellen Baker is a 62 y.o. female history of hypertension and GERD started experiencing left lower extremity numbness around 7 AM after waking up which lasted for around half an hour and resolved. Then about 2 hours later patient started having weakness in the left upper extremity which lasted for a few minutes and resolved. Then around 2:30 PM patient started developing foot drop-like feeling in the left foot which made her drag her foot when walking. Around 4:30 PM patient had gone to [facility] and CT head done there was showing possibility of hemorrhagic in the fourth ventricle. MRI of the brain was done which was showing acute punctate infarcts involving the right posterior parietal lobe. Patient has been transferred to call for further management after discussing with neurologist as patient may need a repeat CT head to make sure there was no bleed and in case neurosurgery is necessary. Over the last 2 days patient has been having some frontal headaches but denies any visual symptoms, difficulty swallowing or speaking and presently patient is able to walk without any difficulty and has good strength in all extremities. Denies any chest pain shortness of breath nausea vomiting abdominal pain. Patient  states she has frequent bowel movements for many years   Hospital Course: Ellen Baker is a 62 y.o. ([birth year]) female who presented to ER with L arm and leg numbess, further workup noted R pareital CVA. Rest of workup as noted below in detail. She was started on Plavix and Statin. As part of CVA workup noted to have >80% R ICA stenosis, was seen by [facility] Dr.CHen and had CTA which confrimed 90% ICA stenosis. She is scheduled for Fu with Dr.Chen in 2 weeks for CEA. Per [facility] recommendation, seen by Dr.Nahser for Pre-op eval and is set up for Myoview at [facility] cardiology next week.  Lipid Panel    Component  Value  Date/Time    CHOL  201*  01/01/2013 0605    TRIG  94  01/01/2013 0605    HDL  56  01/01/2013 0605    CHOLHDL  3.6  01/01/2013 0605    VLDL  19  01/01/2013 0605    LDLCALC  126*  01/01/2013 0605    HgbA1C 5.3  Urine Drug Screen:  No results found for this basename: labopia, cocainscrnur, labbenz, amphetmu, thcu, labbarb    Alcohol Level: No results found for this basename: ETH, in the last 168 hours  Dg Chest 2 View  01/01/2013 Minimal left basilar airspace opacity likely reflects atelectasis; lungs otherwise clear.  CT of the brain  MRI of the brain Reviewed by Dr. Thad Ranger from [facility] showed acute infarcts of right posterior parietal lobe.  MRA of the brain  2D Echocardiogram EF 50%, wall motion normal, no cardiac source of emboli was identified.  Carotid Doppler Right: Greater  than 80% internal carotid artery stenosis. Left: Less than 39% ICA stenosis. Bilateral: Vertebral artery flow is antegrade      Procedures:    Consultations:  [facility] Dr.Chen  [facility] Dr.Sethi  Cards Dr.Nahser  Discharge Exam: Filed Vitals:   01/01/13 1409 01/02/13 0718 01/02/13 1000 01/02/13 1400  BP: 156/66 154/57 153/64 136/76  Pulse: 65 56 70 66  Temp: 97.6 F (36.4 C) 98.7 F (37.1 C) 97.8 F (36.6 C) 98 F (36.7 C)  TempSrc: Oral Oral Oral Oral  Resp: 20 20 18 17   Height:       Weight:      SpO2: 100% 99% 100% 100%    General: AAOx3 Cardiovascular: S!S2/RRR Respiratory: CTAB  Discharge Instructions  Discharge Orders   Future Appointments Provider Department Dept Phone   01/06/2013 7:15 AM [facility]) [facility] SITE 3 NUCLEAR MED [phone]   01/16/2013 11:15 AM Fransisco Hertz, MD [facility] [phone]   Future Orders Complete By Expires     Diet - low sodium heart healthy  As directed     Diet - low sodium heart healthy  As directed     Discharge instructions  As directed     Comments:      Ok to Return to work on Monday 7/21    Increase activity slowly  As directed         Medication List    STOP taking these medications       pantoprazole 40 MG tablet  Commonly known as:  PROTONIX      TAKE these medications       CHEWABLE CALCIUM/D PO  Take 1 tablet by mouth daily.     clopidogrel 75 MG tablet  Commonly known as:  PLAVIX  Take 1 tablet (75 mg total) by mouth daily with breakfast.     famotidine 40 MG tablet  Commonly known as:  PEPCID  Take 1 tablet (40 mg total) by mouth daily.     ICY HOT EX  Apply 1 application topically 2 (two) times daily as needed (knee pain).     losartan 50 MG tablet  Commonly known as:  COZAAR  Take 50 mg by mouth daily.     simvastatin 10 MG tablet  Commonly known as:  ZOCOR  Take 1 tablet (10 mg total) by mouth daily at 6 PM.     UNABLE TO FIND  Ellen Baker was admitted to [facility] from 7/16-7/18 and will be able to return to work from Monday 7/21     VITAMIN D PO  Take 1 tablet by mouth daily.       No Known Allergies     Follow-up Information   Follow up with Nilda Simmer, MD On 01/16/2013.   Contact information:   [address] [place] [address] [phone]       Follow up with [facility] On 01/06/2013. (at 7am)    Contact information:   [address] [place] [address]         The results of significant diagnostics from this hospitalization (including imaging, microbiology, ancillary and laboratory) are listed below for reference.    Significant Diagnostic Studies: Dg Chest 2 View  01/01/2013   *RADIOLOGY REPORT*  Clinical Data: Shortness of breath; CVA.  CHEST - 2 VIEW  Comparison: None.  Findings: The lungs are well-aerated.  Minimal left basilar opacity likely reflects atelectasis.  There is no  evidence of pleural effusion or pneumothorax.  The heart is normal in size; the mediastinal contour is within normal limits.  No acute osseous abnormalities are seen.  IMPRESSION: Minimal left basilar airspace opacity likely reflects atelectasis; lungs otherwise clear.   Original Report Authenticated By: Tonia Ghent, M.D.   Ct Head Wo Contrast  01/01/2013   *RADIOLOGY REPORT*  Clinical Data: Episodic left-sided numbness.  CT HEAD WITHOUT CONTRAST  Technique:  Contiguous axial images were obtained from the base of the skull through the vertex without contrast.  Comparison: [facility] brain 12/31/2012.  Findings:  No intracranial hemorrhage or mass lesion.  Asymmetric hypodensity affects the right parietal cortex (image 20 series 2) consistent with early cytotoxic edema.  Other punctate foci of acute infarction noted on MR not well seen.  Mild atrophy and chronic microvascular ischemic change.  Remote periventricular lacunar infarct left centrum semiovale.   Calvarium intact.  Negative sinuses and mastoids.  Vascular calcification affects the carotid siphons.  No CT signs of proximal vascular thrombosis.  IMPRESSION: Asymmetric hypodensity affects the right parietal cortex consistent with early cytotoxic edema.  No associated hemorrhage.   Original Report Authenticated By: Davonna Belling, M.D.   Ct Angio Neck W/cm &/or Wo/cm  01/01/2013   *RADIOLOGY REPORT*  Clinical Data:  ABNORMAL DOPPLER RIGHT INTERNAL CAROTID ARTERY. LEFT SIDED NUMBNESS.  LEFT-SIDED WEAKNESS.  CT  ANGIOGRAPHY NECK  Technique:  Multidetector CT imaging of the neck was performed using the standard protocol during bolus administration of intravenous contrast.  Multiplanar CT image reconstructions including MIPs were obtained to evaluate the vascular anatomy. Carotid stenosis measurements (when applicable) are obtained utilizing NASCET criteria, using the distal internal carotid diameter as the denominator.  Contrast: 50mL OMNIPAQUE IOHEXOL 350 MG/ML SOLN  Comparison:  [facility] 02/01/2013 head CT.  Outside brain MR 12/31/2012.  Findings:  90% narrowing of the proximal right internal carotid artery (best appreciated on coronal reconstructed images 35 and 36).  Left carotid bifurcation plaque.  Less than 50% diameter narrowing proximal left internal carotid artery.  Left vertebral artery is dominant.  No significant stenosis of the vertebral arteries.  Normal configuration of the origin of the great vessels from the aortic arch.  Atherosclerotic type changes of the aortic arch.  No significant narrowing of the origin of the great vessels.  Asymmetric appearance of thyroid gland with the right thyroid gland larger than the left.  No dominant discrete mass identified.  Scattered normal sized lymph nodes without adenopathy.  Visualized upper lung zones without worrisome mass identified.  Visualized intracranial structures unremarkable.  No bony destructive lesion.   Review of the MIP images confirms the above findings.  IMPRESSION: 90% narrowing of the proximal right internal carotid artery (best appreciated on coronal reconstructed images 35 and 36).  Left carotid bifurcation plaque.  Less than 50% diameter narrowing proximal left internal carotid artery.  Left vertebral artery is dominant.  No significant stenosis of the vertebral arteries.  Critical Value/emergent results were called by telephone at the time of interpretation on 01/01/2013 at 10:35 p.m. to [facility] the patient's nurse, who verbally acknowledged these  results.   Original Report Authenticated By: Lacy Duverney, M.D.    Microbiology: No results found for this or any previous visit (from the past 240 hour(s)).   Labs: Basic Metabolic Panel:  Recent Labs Lab 12/31/12 2255 01/01/13 0605  NA 142 141  K 3.5 3.7  CL 106 107  CO2 29 25  GLUCOSE 108* 92  BUN 9 10  CREATININE 0.69 0.72  CALCIUM 9.0 8.7   Liver Function Tests:  Recent Labs Lab 12/31/12 2255  AST 17  ALT 12  ALKPHOS 72  BILITOT 0.6  PROT 6.8  ALBUMIN 3.1*   No results found for this basename: LIPASE, AMYLASE,  in the last 168 hours No results found for this basename: AMMONIA,  in the last 168 hours CBC:  Recent Labs Lab 12/31/12 2255 01/01/13 0605  WBC 6.7 4.5  NEUTROABS 4.3 2.2  HGB 12.3 11.5*  HCT 36.7 33.8*  MCV 87.4 87.1  PLT 221 191   Cardiac Enzymes:  Recent Labs Lab 12/31/12 2255  TROPONINI <0.30   BNP: BNP (last 3 results) No results found for this basename: PROBNP,  in the last 8760 hours CBG: No results found for this basename: GLUCAP,  in the last 168 hours     Signed:  Nyliah Nierenberg  [facility] 01/02/2013, 9:45 PM

## 2013-01-02 NOTE — Consult Note (Signed)
CARDIOLOGY CONSULT NOTE    Patient ID: Ellen Baker MRN: [id number] DOB/AGE: 62/10/1950 62 y.o.  Admit date: 12/31/2012  Primary Physician   Default, Provider, MD Primary Cardiologist   New to Ellen Baker Reason for Consultation  Pre-operative clearance for Right Carotid Endarterectomy  HPI: Ms. Ellen Baker is a 62 y.o. y/o female w/ PMHx of hypertension and GERD, presented to the ED on 12/31/12 w/ complaints of left sided numbness and weakness of her extremities. MRI showed acute punctate lesions in her R. posterior parietal lobe. A carotid doppler revealed 80-99% stenosis on her R. Internal carotid.  Patient was seen at bedside. She has no complaints at this time. She has no clinical risk factors other than her recent stroke-like symptoms. She denies a history of CAD, DM, or renal issues. Recent 2D ECHO showed normal LV function. She denies any recent history of chest pain, SOB, LOC, dizziness, lightheadedness, fever, chills, nausea, vomiting, or abdominal pain. She reports no previous issues with anesthesia and has a good functional capacity. She is able to perform normal ADL's and is very independent.   Past Medical History  Diagnosis Date  . Hypertension   . Heart murmur   . GERD (gastroesophageal reflux disease)   . Arthritis     right knee     Past Surgical History  Procedure Laterality Date  . Tubal ligation      No Known Allergies  I have reviewed the patient's current medications  . clopidogrel  75 mg Oral Q breakfast  . simvastatin  10 mg Oral q1800     acetaminophen, hydrALAZINE, senna-docusate, traMADol  Prior to Admission medications   Medication Sig Start Date End Date Taking? Authorizing Provider  Calcium Carb-Ergocalciferol (CHEWABLE CALCIUM/D PO) Take 1 tablet by mouth daily.   Yes Historical Provider, MD  Cholecalciferol (VITAMIN D PO) Take 1 tablet by mouth daily.   Yes Historical Provider, MD  clopidogrel (PLAVIX) 75 MG tablet Take 1 tablet (75 mg total)  by mouth daily with breakfast. 01/02/13   Zannie Cove, MD  famotidine (PEPCID) 40 MG tablet Take 1 tablet (40 mg total) by mouth daily. 01/02/13   Zannie Cove, MD  losartan (COZAAR) 50 MG tablet Take 50 mg by mouth daily.   Yes Historical Provider, MD  Menthol, Topical Analgesic, (ICY HOT EX) Apply 1 application topically 2 (two) times daily as needed (knee pain).   Yes Historical Provider, MD  simvastatin (ZOCOR) 10 MG tablet Take 1 tablet (10 mg total) by mouth daily at 6 PM. 01/02/13   Zannie Cove, MD     History   Social History  . Marital Status: Unknown    Spouse Name: N/A    Number of Children: N/A  . Years of Education: N/A   Occupational History  . Not on file.   Social History Main Topics  . Smoking status: Never Smoker   . Smokeless tobacco: Not on file  . Alcohol Use: Not on file  . Drug Use: No  . Sexually Active: Not on file   Other Topics Concern  . Not on file   Social History Narrative  . No narrative on file    No family status information on file.   Family History  Problem Relation Age of Onset  . Stroke Mother   . Heart failure Father      ROS:   General: Denies fever, chills, diaphoresis, appetite change and fatigue.  HEENT: Denies change in vision, congestion, sore throat, rhinorrhea, trouble swallowing, neck  pain or neck stiffness.   Respiratory: Denies SOB, DOE, cough, chest tightness, and wheezing.   Cardiovascular: Denies chest pain, palpitations and leg swelling.  Gastrointestinal: Denies nausea, vomiting, abdominal pain, diarrhea, constipation, blood in stool and abdominal distention.  Endocrine: Denies sweats, polyuria, polydipsia. Musculoskeletal: Denies myalgias, back pain, joint swelling, arthralgias or gait problem.  Skin: Denies pallor, rash and wounds.  Neurological: Recent history of weakness and numbness in her left upper and lower extremities. Denies dizziness, seizures, syncope, lightheadedness, and headaches.    Hematological: Denies adenopathy, easy bruising, personal or family bleeding history.  Psychiatric/Behavioral: Denies mood changes, confusion, nervousness, sleep disturbance and agitation.  Physical Exam: Blood pressure 153/64, pulse 70, temperature 97.8 F (36.6 C), temperature source Oral, resp. rate 18, height 5\' 4"  (1.626 m), weight 215 lb (97.523 kg), SpO2 100.00%.  General: Vital signs reviewed.  Patient is a well-developed and well-nourished, in no acute distress and cooperative with exam. Alert and oriented x3.  Head: Normocephalic and atraumatic. Mouth: No erythema, exudates, sores, or ulcerations. Moist mucus membranes. Eyes: PERRL, EOMI, conjunctivae normal, No scleral icterus.  Neck: Supple, trachea midline, normal ROM, No JVD, masses, thyromegaly, or carotid bruit present.  Cardiovascular: RRR, S1 normal, S2 normal, no murmurs, gallops, or rubs. Pulmonary/Chest: Normal respiratory effort, CTAB, no wheezes, rales, or rhonchi. Abdominal: Soft. Non-tender, non-distended, bowel sounds are normal, no masses, organomegaly, or guarding present.  Musculoskeletal: No joint deformities, erythema, or stiffness, ROM full and no nontender. Extremities: Trace LE edema.  Pulses symmetric and intact bilaterally. No cyanosis or clubbing. Hematology: no cervical, inginal, or axillary adenopathy.  Neurological: A&O x3, Strength is normal and symmetric bilaterally, cranial nerve II-XII are grossly intact, no focal motor deficit, sensory intact to light touch bilaterally.  Skin: Warm, dry and intact. No rashes or erythema. Psychiatric: Normal mood and affect. speech and behavior is normal. Cognition and memory are normal.  Labs:   Lab Results  Component Value Date   WBC 4.5 01/01/2013   HGB 11.5* 01/01/2013   HCT 33.8* 01/01/2013   MCV 87.1 01/01/2013   PLT 191 01/01/2013   No results found for this basename: INR,  in the last 72 hours  Recent Labs Lab 12/31/12 2255 01/01/13 0605  NA 142 141   K 3.5 3.7  CL 106 107  CO2 29 25  BUN 9 10  CREATININE 0.69 0.72  CALCIUM 9.0 8.7  PROT 6.8  --   BILITOT 0.6  --   ALKPHOS 72  --   ALT 12  --   AST 17  --   GLUCOSE 108* 92   No results found for this basename: mg    Recent Labs  12/31/12 2255  TROPONINI <0.30   No results found for this basename: TROPIPOC,  in the last 72 hours No results found for this basename: probnp   Lab Results  Component Value Date   CHOL 201* 01/01/2013   HDL 56 01/01/2013   LDLCALC 126* 01/01/2013   TRIG 94 01/01/2013   No results found for this basename: DDIMER   No results found for this basename: Lipase, amylase   TSH  Date/Time Value Range Status  12/31/2012 10:55 PM 2.570  0.350 - 4.500 uIU/mL Final   No results found for this basename: VitaminB12, Folate, Ferritin, TIBC, Iron, reticctpct    Echo: 01/01/13  Study Conclusions: Left ventricle: The cavity size was normal. Systolic function was normal. The estimated ejection fraction was in the range of 50% to 55%. Wall motion  was normal; there were no regional wall motion abnormalities. Pulmonary arteries: Systolic pressure was mildly increased. PA peak pressure: 31mm Hg (S).  Impressions: No cardiac source of emboli was indentified.  ECG: No ECG  Radiology:  Dg Chest 2 View  01/01/2013   *RADIOLOGY REPORT*  Clinical Data: Shortness of breath; CVA.  CHEST - 2 VIEW  Comparison: None.  Findings: The lungs are well-aerated.  Minimal left basilar opacity likely reflects atelectasis.  There is no evidence of pleural effusion or pneumothorax.  The heart is normal in size; the mediastinal contour is within normal limits.  No acute osseous abnormalities are seen.  IMPRESSION: Minimal left basilar airspace opacity likely reflects atelectasis; lungs otherwise clear.   Original Report Authenticated By: Tonia Ghent, M.D.   Ct Head Wo Contrast  01/01/2013   *RADIOLOGY REPORT*  Clinical Data: Episodic left-sided numbness.  CT HEAD WITHOUT  CONTRAST  Technique:  Contiguous axial images were obtained from the base of the skull through the vertex without contrast.  Comparison:  [facility] MRI brain 12/31/2012.  Findings:  No intracranial hemorrhage or mass lesion.  Asymmetric hypodensity affects the right parietal cortex (image 20 series 2) consistent with early cytotoxic edema.  Other punctate foci of acute infarction noted on MR not well seen.  Mild atrophy and chronic microvascular ischemic change.  Remote periventricular lacunar infarct left centrum semiovale.   Calvarium intact.  Negative sinuses and mastoids.  Vascular calcification affects the carotid siphons.  No CT signs of proximal vascular thrombosis.  IMPRESSION: Asymmetric hypodensity affects the right parietal cortex consistent with early cytotoxic edema.  No associated hemorrhage.   Original Report Authenticated By: Davonna Belling, M.D.   Ct Angio Neck W/cm &/or Wo/cm  01/01/2013   *RADIOLOGY REPORT*  Clinical Data:  ABNORMAL DOPPLER RIGHT INTERNAL CAROTID ARTERY. LEFT SIDED NUMBNESS.  LEFT-SIDED WEAKNESS.  CT ANGIOGRAPHY NECK  Technique:  Multidetector CT imaging of the neck was performed using the standard protocol during bolus administration of intravenous contrast.  Multiplanar CT image reconstructions including MIPs were obtained to evaluate the vascular anatomy. Carotid stenosis measurements (when applicable) are obtained utilizing NASCET criteria, using the distal internal carotid diameter as the denominator.  Contrast: 50mL OMNIPAQUE IOHEXOL 350 MG/ML SOLN  Comparison:  [facility] 02/01/2013 head CT.  Outside brain MR 12/31/2012.  Findings:  90% narrowing of the proximal right internal carotid artery (best appreciated on coronal reconstructed images 35 and 36).  Left carotid bifurcation plaque.  Less than 50% diameter narrowing proximal left internal carotid artery.  Left vertebral artery is dominant.  No significant stenosis of the vertebral arteries.  Normal configuration of the  origin of the great vessels from the aortic arch.  Atherosclerotic type changes of the aortic arch.  No significant narrowing of the origin of the great vessels.  Asymmetric appearance of thyroid gland with the right thyroid gland larger than the left.  No dominant discrete mass identified.  Scattered normal sized lymph nodes without adenopathy.  Visualized upper lung zones without worrisome mass identified.  Visualized intracranial structures unremarkable.  No bony destructive lesion.   Review of the MIP images confirms the above findings.  IMPRESSION: 90% narrowing of the proximal right internal carotid artery (best appreciated on coronal reconstructed images 35 and 36).  Left carotid bifurcation plaque.  Less than 50% diameter narrowing proximal left internal carotid artery.  Left vertebral artery is dominant.  No significant stenosis of the vertebral arteries.  Critical Value/emergent results were called by telephone at the time of  interpretation on 01/01/2013 at 10:35 p.m. to [facility] the patient's nurse, who verbally acknowledged these results.   Original Report Authenticated By: Lacy Duverney, M.D.    ASSESSMENT AND PLAN:    Ms. Ellen Baker is a 62 y.o. y/o female w/ PMHx of hypertension and GERD, presented to the ED on 12/31/12 w/ complaints of left sided numbness and weakness of her extremities. Please obtain an ECG prior to surgery.  Also, due to the fact that she is having an invasive vascular surgery, the patient should have a Lexiscan Myoview study to rule out any cardiac issue. Otherwise, based on her PMHx, no presence of known cardiac disease w/ a normal ECHO, and normal functional capacity, the patient should be clear for surgery from a cardiac standpoint at this time, given a normal ECG and stress test.  Signed: Lars Masson, MD 01/02/2013 12:56 PM  Attending Note:   The patient was seen and examined.  Agree with assessment and plan as noted above.  Changes made to the above note as  needed.  Ms. Ellen Baker is a pleasant female with TIA symptoms- found to have a tight right carotid.   No CP.  She does have some DOE climbing stairs.  Exam is as above.  She is very stable and can go home.   We will arrange for her to have a Lexiscan myoview study as OP next week in the office.   If the myoview is negative, she should be at low risk for her upcoming right CEA.   Vesta Mixer, Montez Hageman., MD, [facility] 01/02/2013, 2:18 PM

## 2013-01-02 NOTE — Progress Notes (Signed)
Stroke Team Progress Note  HISTORY HPI: Ellen Baker is an 62 y.o. female who reports that she awakened today normal. When she went to the bathroom this morning after getting out of bed noted that she was numb on the left. This lasted for a few minutes and resolved spontaneously. The patient went about her day and while at work at about 12 noon noted that she was unable to use her left hand normally. This as well resolved after a few minutes. At 2:30 she noted that she was dragging her left foot. This lasted a little longer than the previous events. She estimates that it lasted about 5 minutes but resolved as well. The patient was unable to reach her PCP and presented to the ED at [facility] for evaluation. Work up there included a MRI of the brain that showed an acute infarct. Patient was transferred here for further evaluation.   Patient reports that for the past two days has had a dull frontal headache. There has bee no associated nausea, vomiting, photophobia or phonophobia.   Date last known well: Date: 12/31/2012  Time last known well: Time: 14:30  tPA Given: No: Symptoms resolved   SUBJECTIVE  Patient sitting in chair. Does feel better. Family in room.    OBJECTIVE Most recent Vital Signs: Filed Vitals:   01/01/13 0527 01/01/13 1010 01/01/13 1409 01/02/13 0718  BP: 121/55 142/67 156/66 154/57  Pulse: 56 65 65 56  Temp: 97.6 F (36.4 C) 97.9 F (36.6 C) 97.6 F (36.4 C) 98.7 F (37.1 C)  TempSrc: Oral Oral Oral Oral  Resp: 18 18 20 20   Height:      Weight:      SpO2: 100% 98% 100% 99%   CBG (last 3)  No results found for this basename: GLUCAP,  in the last 72 hours  IV Fluid Intake:     MEDICATIONS  . clopidogrel  75 mg Oral Q breakfast  . simvastatin  10 mg Oral q1800   PRN:  acetaminophen, hydrALAZINE, senna-docusate, traMADol  Diet:  Cardiac thin liquids Activity:  Out of bed DVT Prophylaxis:  SCD  CLINICALLY SIGNIFICANT STUDIES Basic Metabolic Panel:    Recent Labs Lab 12/31/12 2255 01/01/13 0605  NA 142 141  K 3.5 3.7  CL 106 107  CO2 29 25  GLUCOSE 108* 92  BUN 9 10  CREATININE 0.69 0.72  CALCIUM 9.0 8.7   Liver Function Tests:   Recent Labs Lab 12/31/12 2255  AST 17  ALT 12  ALKPHOS 72  BILITOT 0.6  PROT 6.8  ALBUMIN 3.1*   CBC:   Recent Labs Lab 12/31/12 2255 01/01/13 0605  WBC 6.7 4.5  NEUTROABS 4.3 2.2  HGB 12.3 11.5*  HCT 36.7 33.8*  MCV 87.4 87.1  PLT 221 191   Coagulation: No results found for this basename: LABPROT, INR,  in the last 168 hours Cardiac Enzymes:   Recent Labs Lab 12/31/12 2255  TROPONINI <0.30   Urinalysis: No results found for this basename: COLORURINE, APPERANCEUR, LABSPEC, PHURINE, GLUCOSEU, HGBUR, BILIRUBINUR, KETONESUR, PROTEINUR, UROBILINOGEN, NITRITE, LEUKOCYTESUR,  in the last 168 hours Lipid Panel    Component Value Date/Time   CHOL 201* 01/01/2013 0605   TRIG 94 01/01/2013 0605   HDL 56 01/01/2013 0605   CHOLHDL 3.6 01/01/2013 0605   VLDL 19 01/01/2013 0605   LDLCALC 126* 01/01/2013 0605   HgbA1C 5.3  Urine Drug Screen:   No results found for this basename: labopia,  cocainscrnur,  labbenz,  amphetmu,  thcu,  labbarb    Alcohol Level: No results found for this basename: ETH,  in the last 168 hours  Dg Chest 2 View 01/01/2013 Minimal left basilar airspace opacity likely reflects atelectasis; lungs otherwise clear.   CT of the brain   MRI of the brain  Reviewed by Dr. Thad Ranger from [facility] showed acute infarcts of right posterior parietal lobe.  MRA of the brain    2D Echocardiogram  EF 50%, wall motion normal, no cardiac source of emboli was identified.  Carotid Doppler Right: Greater than 80% internal carotid artery stenosis. Left: Less than 39% ICA stenosis. Bilateral: Vertebral artery flow is antegrade  EKG    Therapy Recommendations no PT/OT followup  Physical Exam    Neurologic Examination:  Mental Status:  Alert, oriented, thought content  appropriate. Speech fluent without evidence of aphasia. Able to follow 3 step commands without difficulty.  Cranial Nerves:  II: Discs flat bilaterally; Visual fields grossly normal, pupils equal, round, reactive to light and accommodation  III,IV, VI: ptosis not present, extra-ocular motions intact bilaterally  V,VII: smile symmetric, facial light touch sensation normal bilaterally  VIII: hearing normal bilaterally  IX,X: gag reflex present  XI: bilateral shoulder shrug  XII: midline tongue extension  Motor:  Right : Upper extremity 5/5 Left: Upper extremity 5/5  Lower extremity 5/5 Lower extremity 5/5  Tone and bulk:normal tone throughout; no atrophy noted  Sensory: Pinprick and light touch intact throughout, bilaterally  Plantars:  Right: downgoing Left: downgoing  Cerebellar:  normal finger-to-nose and normal heel-to-shin test  Gait: normal gait and station  CV: pulses palpable throughout     ASSESSMENT Ms. Ellen Baker is a 62 y.o. female presenting with intermittent left hemiparesis. Imaging confirms acute infarcts of right posterior parietal lobe in watershed distribution. Infarct felt to be embolic secondary to unknown source.  On no antithrombotics prior to admission. Now on clopidogrel 75 mg orally every day for secondary stroke prevention. Patient with resultant left hemiparesis. Work up underway.   Hypertension  Hyperlipidemia, LDL 126, goal < 100 in nondiabetic patients, started on zocor  Family history of stroke  Long term medication use  Right internal carotid artery stenosis, symptomatic  Hospital day # 2  TREATMENT/PLAN  Continue clopidogrel 75 mg orally every day for secondary stroke prevention.  Vascular consult, Dr. Imogene Burn recommends procedure in 2-4 weeks to avoid extending extent of infarction. Also to obtain cardiology consult for clearance. She is to follow up with him 2 weeks for preop counseling.  Follow up in 2 months with Dr. Pearlean Brownie.  Neurology  will sign off.  Avoid protonix with plavix use as it decreases effectiveness. Zantac as option.  Gwendolyn Lima. Manson Passey, [facility], MBA, MHA [facility] Pager: [phone] 01/02/2013 8:46 AM  I have personally obtained a history, examined the patient, evaluated imaging results, and formulated the assessment and plan of care. I agree with the above.  Delia Heady, MD

## 2013-01-02 NOTE — Telephone Encounter (Signed)
Per call from Dr. Elease Hashimoto pt needs Ellen Baker. This is for pre-op clearance for right carotid endarterectomy. She is currently in hospital and plan is to discharge her home today.  Pt's cell phone is [phone]. Pt currently in hospital. I have this scheduled for January 06, 2013 at 7:15. Will contact pt when discharged from hospital. (results of myoview will need to go to office DOD for review as Dr. Elease Hashimoto not in office next week)

## 2013-01-05 NOTE — Telephone Encounter (Signed)
Spoke with patient to inform her of Lexiscan appointment tomorrow.  Patient verbalized understanding and I reviewed our location with her.  I am routing message to DODs for Tuesday and Wednesday, Drs. Shirlee Latch and [place] and to [employer], RN per her request.

## 2013-01-06 ENCOUNTER — Ambulatory Visit (HOSPITAL_COMMUNITY): Payer: BC Managed Care – PPO | Attending: Cardiology | Admitting: Radiology

## 2013-01-06 ENCOUNTER — Other Ambulatory Visit: Payer: Self-pay | Admitting: *Deleted

## 2013-01-06 VITALS — BP 133/80 | Ht 65.0 in | Wt 209.0 lb

## 2013-01-06 DIAGNOSIS — R0989 Other specified symptoms and signs involving the circulatory and respiratory systems: Secondary | ICD-10-CM | POA: Insufficient documentation

## 2013-01-06 DIAGNOSIS — I6529 Occlusion and stenosis of unspecified carotid artery: Secondary | ICD-10-CM | POA: Insufficient documentation

## 2013-01-06 DIAGNOSIS — Z8673 Personal history of transient ischemic attack (TIA), and cerebral infarction without residual deficits: Secondary | ICD-10-CM | POA: Insufficient documentation

## 2013-01-06 DIAGNOSIS — R0609 Other forms of dyspnea: Secondary | ICD-10-CM | POA: Insufficient documentation

## 2013-01-06 DIAGNOSIS — I10 Essential (primary) hypertension: Secondary | ICD-10-CM | POA: Insufficient documentation

## 2013-01-06 DIAGNOSIS — Z01818 Encounter for other preprocedural examination: Secondary | ICD-10-CM

## 2013-01-06 MED ORDER — TECHNETIUM TC 99M SESTAMIBI GENERIC - CARDIOLITE
10.0000 | Freq: Once | INTRAVENOUS | Status: AC | PRN
  Administered 2013-01-06: 10 via INTRAVENOUS

## 2013-01-06 MED ORDER — TECHNETIUM TC 99M SESTAMIBI GENERIC - CARDIOLITE
30.0000 | Freq: Once | INTRAVENOUS | Status: AC | PRN
  Administered 2013-01-06: 30 via INTRAVENOUS

## 2013-01-06 MED ORDER — REGADENOSON 0.4 MG/5ML IV SOLN
0.4000 mg | Freq: Once | INTRAVENOUS | Status: AC
  Administered 2013-01-06: 0.4 mg via INTRAVENOUS

## 2013-01-06 NOTE — Progress Notes (Signed)
[facility] SITE 3 NUCLEAR MED [address] [place], [place] [address] [phone]    Cardiology Nuclear Med Study  Ellen Baker is a 62 y.o. female     MRN : [id number]     DOB: 10/20/1950  Procedure Date: 01/06/2013  Nuclear Med Background Indication for Stress Test:  Evaluation for Ischemia, Patient seen in hospital on 12-31-12 for CVA,  Enzymes negative, and Pending Surgical Clearance for  (R) CEA  by Dr. Leonides Sake History:  01/01/13 ECHO: 50-50%mild TR Cardiac Risk Factors: Carotid Disease, CVA, Hypertension, Lipids and TIA  Symptoms:  DOE   Nuclear Pre-Procedure Caffeine/Decaff Intake:  None> 12 hrs NPO After: 7:30pm   Lungs:  clear O2 Sat: 98% on room air. IV 0.9% NS with Angio Cath:  22g  IV Site: R Hand x 1, tolerated well IV Started by:  Irean Hong, RN  Chest Size (in):  44 Cup Size: DDD  Height: 5\' 5"  (1.651 m)  Weight:  209 lb (94.802 kg)  BMI:  Body mass index is 34.78 kg/(m^2). Tech Comments:  Losartan this am    Nuclear Med Study 1 or 2 day study: 1 day  Stress Test Type:  Lexiscan  Reading MD: Olga Millers, MD  Order Authorizing Provider:  Marca Ancona, MD, and Leonides Sake, MD  Resting Radionuclide: Technetium 47m Sestamibi  Resting Radionuclide Dose: 11.0 mCi   Stress Radionuclide:  Technetium 2m Sestamibi  Stress Radionuclide Dose: 33.0 mCi           Stress Protocol Rest HR: 58 Stress HR: 86  Rest BP: 133/80 Stress BP: 139/72  Exercise Time (min): n/a METS: n/a   Predicted Max HR: 159 bpm % Max HR: 54.09 bpm Rate Pressure Product: 69629   Dose of Adenosine (mg):  n/a Dose of Lexiscan: 0.4 mg  Dose of Atropine (mg): n/a Dose of Dobutamine: n/a mcg/kg/min (at max HR)  Stress Test Technologist: Milana Na, EMT-P  Nuclear Technologist:  Domenic Polite, CNMT     Rest Procedure:  Myocardial perfusion imaging was performed at rest 45 minutes following the intravenous administration of Technetium 74m Sestamibi. Rest ECG: NSR,  anterior MI, nonspecific ST changes.  Stress Procedure:  The patient received IV Lexiscan 0.4 mg over 15-seconds.  Technetium 108m Sestamibi injected at 30-seconds. This patient was warm, had throat tightness, and abdominal discomfort with the Lexiscan injection. Quantitative spect images were obtained after a 45 minute delay. Stress ECG: No significant ST segment change suggestive of ischemia.  QPS Raw Data Images:  Acquisition technically good; normal left ventricular size. Stress Images:  There is decreased uptake in the anterior wall and inferolateral wall. Rest Images:  There is decreased uptake in the anterior wall. Subtraction (SDS):  These findings are consistent with soft tissue attenuation in the anterior wall and ischemia in the inferolateral wall. Transient Ischemic Dilatation (Normal <1.22):  n/a Lung/Heart Ratio (Normal <0.45):  0.33  Quantitative Gated Spect Images QGS EDV:  822 ml QGS ESV:  27 ml  Impression Exercise Capacity:  Lexiscan with no exercise. BP Response:  Normal blood pressure response. Clinical Symptoms:  There is throat tightness. ECG Impression:  No significant ST segment change suggestive of ischemia. Comparison with Prior Nuclear Study: No images to compare  Overall Impression:  Low risk stress nuclear study with a small, medium intensity, fixed anterior defect consistent with soft tissue attenuation; there is also a moderate size, medium intensity, reversible inferolateral defect consistent with mild ischemia.   LV Ejection Fraction:  67%.  LV Wall Motion:  NL LV Function; NL Wall Motion  Olga Millers

## 2013-01-07 ENCOUNTER — Encounter (HOSPITAL_COMMUNITY): Payer: BC Managed Care – PPO

## 2013-01-07 NOTE — Telephone Encounter (Signed)
Pt was admitted before test could be done.

## 2013-01-09 ENCOUNTER — Encounter (HOSPITAL_COMMUNITY): Payer: Self-pay | Admitting: Pharmacy Technician

## 2013-01-14 NOTE — Telephone Encounter (Signed)
Follow up  Pt is returning your call regarding her stress test results.

## 2013-01-15 ENCOUNTER — Encounter: Payer: Self-pay | Admitting: Vascular Surgery

## 2013-01-16 ENCOUNTER — Encounter (HOSPITAL_COMMUNITY)
Admission: RE | Admit: 2013-01-16 | Discharge: 2013-01-16 | Disposition: A | Discharge status: Home / Self Care | Payer: BC Managed Care – PPO | Source: Ambulatory Visit | Attending: Vascular Surgery | Admitting: Vascular Surgery

## 2013-01-16 ENCOUNTER — Encounter: Payer: Self-pay | Admitting: Vascular Surgery

## 2013-01-16 ENCOUNTER — Encounter (HOSPITAL_COMMUNITY): Payer: Self-pay

## 2013-01-16 ENCOUNTER — Ambulatory Visit (INDEPENDENT_AMBULATORY_CARE_PROVIDER_SITE_OTHER): Payer: BC Managed Care – PPO | Admitting: Vascular Surgery

## 2013-01-16 DIAGNOSIS — Z01810 Encounter for preprocedural cardiovascular examination: Secondary | ICD-10-CM | POA: Insufficient documentation

## 2013-01-16 DIAGNOSIS — I6529 Occlusion and stenosis of unspecified carotid artery: Secondary | ICD-10-CM | POA: Insufficient documentation

## 2013-01-16 LAB — COMPREHENSIVE METABOLIC PANEL
Alkaline Phosphatase: 92 U/L (ref 39–117)
BUN: 11 mg/dL (ref 6–23)
Chloride: 103 mEq/L (ref 96–112)
Creatinine, Ser: 0.74 mg/dL (ref 0.50–1.10)
GFR calc Af Amer: >90 mL/min (ref >90)
Glucose, Bld: 100 mg/dL — ABNORMAL HIGH (ref 70–99)
Potassium: 4.2 mEq/L (ref 3.5–5.1)
Total Bilirubin: 0.6 mg/dL (ref 0.3–1.2)

## 2013-01-16 LAB — PROTIME-INR: Prothrombin Time: 13.7 seconds (ref 11.6–15.2)

## 2013-01-16 LAB — CBC
HCT: 41.4 % (ref 36.0–46.0)
Hemoglobin: 14.3 g/dL (ref 12.0–15.0)
MCHC: 34.5 g/dL (ref 30.0–36.0)
MCV: 87.5 fL (ref 78.0–100.0)
WBC: 6.1 10*3/uL (ref 4.0–10.5)

## 2013-01-16 LAB — URINALYSIS, ROUTINE W REFLEX MICROSCOPIC
Bilirubin Urine: NEGATIVE
Glucose, UA: NEGATIVE mg/dL
Hgb urine dipstick: NEGATIVE
Nitrite: NEGATIVE
Specific Gravity, Urine: 1.02 (ref 1.005–1.030)
pH: 5.5 (ref 5.0–8.0)

## 2013-01-16 LAB — APTT: aPTT: 31 seconds (ref 24–37)

## 2013-01-16 LAB — URINE MICROSCOPIC-ADD ON

## 2013-01-16 LAB — ABO/RH: ABO/RH(D): A POS

## 2013-01-16 NOTE — Progress Notes (Signed)
[facility] [place]  Established Carotid Patient  History of Present Illness  Ellen Baker is a 62 y.o. (10-31-1950) female who presents with chief complaint: pre-operative follow-up.  Previous CTA demonstrated: RICA 90% stenosis, LICA <50% stenosis.  Patient has known history of R brain based TIA  Symptoms (01/01/13).  The patient has never had amaurosis fugax or monocular blindness.  The patient has no had facial drooping or hemiplegia.  The patient has never had receptive or expressive aphasia.  The patient's previous neurologic deficits have resolved: numbness in LUE x 2 and LLE with development of motor weakness in LLE with foot drag.  The patient was seen by Dr. Elease Hashimoto while admitted and cleared for surgery.  The patient returns for preoperative counseling.  Past Medical History  Diagnosis Date  . Hypertension   . Heart murmur   . GERD (gastroesophageal reflux disease)   . Arthritis     right knee  . Stroke 12-31-12    Past Surgical History  Procedure Laterality Date  . Tubal ligation    . Colonoscopy  08-18-12  . Upper gi endoscopy  08-18-12    History   Social History  . Marital Status: Widowed    Spouse Name: N/A    Number of Children: N/A  . Years of Education: N/A   Occupational History  . Not on file.   Social History Main Topics  . Smoking status: Never Smoker   . Smokeless tobacco: Never Used  . Alcohol Use: No  . Drug Use: No  . Sexually Active: Not on file   Other Topics Concern  . Not on file   Social History Narrative  . No narrative on file    Family History  Problem Relation Age of Onset  . Stroke Mother   . Deep vein thrombosis Mother   . Heart disease Mother     Amputation-  . Hypertension Mother   . Alzheimer's disease Mother   . Heart failure Father   . Heart disease Father   . Hypertension Father     Current Outpatient Prescriptions on File Prior to Visit  Medication Sig Dispense Refill  . Calcium  Carb-Ergocalciferol (CHEWABLE CALCIUM/D PO) Take 1 tablet by mouth daily.      . Cholecalciferol (VITAMIN D PO) Take 1 tablet by mouth daily.      . clopidogrel (PLAVIX) 75 MG tablet Take 1 tablet (75 mg total) by mouth daily with breakfast.  30 tablet  0  . famotidine (PEPCID) 40 MG tablet Take 1 tablet (40 mg total) by mouth daily.  30 tablet  0  . losartan (COZAAR) 50 MG tablet Take 50 mg by mouth daily.      . Menthol, Topical Analgesic, (ICY HOT EX) Apply 1 application topically 2 (two) times daily as needed (knee pain).      . simvastatin (ZOCOR) 10 MG tablet Take 1 tablet (10 mg total) by mouth daily at 6 PM.  30 tablet  0  . UNABLE TO FIND Ellen Baker was admitted to [facility] from 7/16-7/18 and will be able to return to work from Monday 7/21  1 %  0   No current facility-administered medications on file prior to visit.    Allergies  Allergen Reactions  . Adhesive (Tape) Rash    ROS: [x]  Positive [ ]  Denies  General: [ ]  Weight loss, [ ]  Fever, [ ]  chills  Neurologic: [ ]  Dizziness, [x]  Blackouts, [ ]  Seizure, [ ]  Stroke, [x]  "  Mini stroke", [ ]  Slurred speech, [ ]  Temporary blindness; [ ]  weakness in arms or legs, [ ]  Hoarseness  Cardiac: [ ]  Chest pain/pressure, [ ]  Shortness of breath at rest [ ]  Shortness of breath with exertion, [ ]  Atrial fibrillation or irregular heartbeat  Vascular: [ ]  Pain in legs with walking, [ ]  Pain in legs at rest, [ ]  Pain in legs at night, [ ]  Non-healing ulcer, [ ]  Blood clot in vein/DVT,  Pulmonary: [ ]  Home oxygen, [ ]  Productive cough, [ ]  Coughing up blood, [ ]  Asthma, [ ]  Wheezing  Musculoskeletal: [x]  Arthritis, [ ]  Low back pain, [ ]  Joint pain  Hematologic: [ ]  Easy Bruising, [ ]  Anemia; [ ]  Hepatitis  Gastrointestinal: [ ]  Blood in stool, [ ]  Gastroesophageal Reflux/heartburn, [x]  Trouble swallowing due to esophageal stricture  Urinary: [ ]  chronic Kidney disease, [ ]  on HD - [ ]  MWF or [ ]  TTHS, [ ]  Burning with urination, [ ]  Difficulty  urinating  Skin: [ ]  Rashes, [ ]  Wounds  Psychological: [ ]  Anxiety, [ ]  Depression   For VQI Use Only  PRE-ADM LIVING: Home  AMB STATUS: Ambulatory  CAD Sx: None  PRIOR CHF: None  STRESS TEST: [x]  No, [ ]  Normal, [ ]  + ischemia, [ ]  + MI, [ ]  Both  Physical Examination  Filed Vitals:   01/16/13 1147 01/16/13 1150  BP: 155/74 151/70  Pulse: 61 59  Resp: 16   Height: 5' 4.5" (1.638 m)   Weight: 210 lb (95.255 kg)   SpO2: 100% 100%   Body mass index is 35.5 kg/(m^2).  General: A&O x 3, WD, Obese   Eyes: PERRLA, EOMI   Neck: Supple, no nuchal rigidity, no palpable LAD   Pulmonary: Sym exp, good air movt, CTAB, no rales, rhonchi, & wheezing   Cardiac: RRR, Nl S1, S2, no Murmurs, rubs or gallops   Vascular:  Vessel  Right  Left   Radial  Palpable  Palpable   Brachial  Palpable  Palpable   Carotid  Palpable, without bruit  Palpable, without bruit   Aorta  Not palpable  N/A   Femoral  Palpable  Palpable   Popliteal  Not palpable  Not palpable   PT  Palpable  Palpable   DP  Palpable  Palpable    Gastrointestinal: soft, NTND, -G/R, - HSM, - masses, - CVAT B   Musculoskeletal: M/S 5/5 throughout , Extremities without ischemic changes   Neurologic: CN 2-12 intact , Pain and light touch intact in extremities , Motor exam as listed above   Medical Decision Making  Ellen Baker is a 62 y.o. female who presents with: R sx ICA stenosis 90%.   Based on the patient's vascular studies and examination, I have offered the patient: R CEA. I discussed with the patient the risks, benefits, and alternatives to carotid endarterectomy.   The patient is aware that the risks of carotid endarterectomy include but are not limited to: bleeding, infection, stroke, myocardial infarction, death, cranial nerve injuries both temporary and permanent, neck hematoma, possible airway compromise, labile blood pressure post-operatively, cerebral hyperperfusion syndrome, and possible need for  additional interventions in the future.  The patient is aware of the risks and agrees to proceed forward with the procedure.  I discussed in depth with the patient the nature of atherosclerosis, and emphasized the importance of maximal medical management including strict control of blood pressure, blood glucose, and lipid levels, antiplatelet agents, obtaining regular  exercise, and cessation of smoking.    The patient is aware that without maximal medical management the underlying atherosclerotic disease process will progress, limiting the benefit of any interventions. The patient is currently on a statin: Zocor.    The patient is currently on an anti-platelet: Plavix.    Thank you for allowing [place] to participate in this patient's care.  Leonides Sake, MD [facility] [place] Office: [phone] Pager: [phone]  01/16/2013, 12:24 PM

## 2013-01-16 NOTE — Pre-Procedure Instructions (Signed)
Ellen Baker  01/16/2013   Your procedure is scheduled on: Monday, January 19, 2013  Report to [facility] at 5:30 AM.  Call this number if you have problems the morning of surgery: [phone]   Remember:   Do not eat food or drink liquids after midnight.   Take these medicines the morning of surgery with A SIP OF WATER:  famotidine (PEPCID) 40 MG tablet  Stop taking Aspirin, Plavix, and herbal medications. Do not take any NSAIDs ie: Ibuprofen, Advil, Naproxen or any  medication containing Aspirin.   Do not wear jewelry, make-up or nail polish.  Do not wear lotions, powders, or perfumes. You may wear deodorant.  Do not shave 48 hours prior to surgery.   Do not bring valuables to the hospital.  [facility] is not responsible for any belongings or valuables.  Contacts, dentures or bridgework may not be worn into surgery.  Leave suitcase in the car. After surgery it may be brought to your room.  For patients admitted to the hospital, checkout time is 11:00 AM the day of discharge.   Patients discharged the day of surgery will not be allowed to drive home.  Name and phone number of your driver:   Special Instructions: Shower using CHG 2 nights before surgery and the night before surgery.  If you shower the day of surgery use CHG.  Use special wash - you have one bottle of CHG for all showers.  You should use approximately 1/3 of the bottle for each shower.   Please read over the following fact sheets that you were given: Pain Booklet, Coughing and Deep Breathing, Blood Transfusion Information, MRSA Information and Surgical Site Infection Prevention

## 2013-01-18 MED ORDER — DEXTROSE 5 % IV SOLN
1.5000 g | INTRAVENOUS | Status: AC
  Administered 2013-01-19: 1.5 g via INTRAVENOUS
  Filled 2013-01-18: qty 1.5

## 2013-01-18 MED ORDER — SODIUM CHLORIDE 0.9 % IV SOLN: INTRAVENOUS | Status: DC

## 2013-01-19 ENCOUNTER — Encounter (HOSPITAL_COMMUNITY): Payer: Self-pay | Admitting: *Deleted

## 2013-01-19 ENCOUNTER — Inpatient Hospital Stay (HOSPITAL_COMMUNITY): Payer: BC Managed Care – PPO | Admitting: Anesthesiology

## 2013-01-19 ENCOUNTER — Encounter (HOSPITAL_COMMUNITY)
Admission: RE | Disposition: A | Discharge status: Home / Self Care | Payer: Self-pay | Source: Ambulatory Visit | Attending: Vascular Surgery

## 2013-01-19 ENCOUNTER — Telehealth: Payer: Self-pay | Admitting: Vascular Surgery

## 2013-01-19 ENCOUNTER — Inpatient Hospital Stay (HOSPITAL_COMMUNITY)
Admission: RE | Admit: 2013-01-19 | Discharge: 2013-01-20 | DRG: 838 | Disposition: A | Discharge status: Home / Self Care | Payer: BC Managed Care – PPO | Source: Ambulatory Visit | Attending: Vascular Surgery | Admitting: Vascular Surgery

## 2013-01-19 ENCOUNTER — Encounter (HOSPITAL_COMMUNITY): Payer: Self-pay | Admitting: Anesthesiology

## 2013-01-19 DIAGNOSIS — Z8673 Personal history of transient ischemic attack (TIA), and cerebral infarction without residual deficits: Secondary | ICD-10-CM

## 2013-01-19 DIAGNOSIS — K219 Gastro-esophageal reflux disease without esophagitis: Secondary | ICD-10-CM | POA: Diagnosis present

## 2013-01-19 DIAGNOSIS — I6529 Occlusion and stenosis of unspecified carotid artery: Principal | ICD-10-CM | POA: Diagnosis present

## 2013-01-19 DIAGNOSIS — M1710 Unilateral primary osteoarthritis, unspecified knee: Secondary | ICD-10-CM | POA: Diagnosis present

## 2013-01-19 DIAGNOSIS — I10 Essential (primary) hypertension: Secondary | ICD-10-CM | POA: Diagnosis present

## 2013-01-19 DIAGNOSIS — Z8249 Family history of ischemic heart disease and other diseases of the circulatory system: Secondary | ICD-10-CM

## 2013-01-19 DIAGNOSIS — Z01812 Encounter for preprocedural laboratory examination: Secondary | ICD-10-CM

## 2013-01-19 DIAGNOSIS — E876 Hypokalemia: Secondary | ICD-10-CM | POA: Diagnosis present

## 2013-01-19 DIAGNOSIS — Z9851 Tubal ligation status: Secondary | ICD-10-CM

## 2013-01-19 DIAGNOSIS — Z823 Family history of stroke: Secondary | ICD-10-CM

## 2013-01-19 HISTORY — PX: ENDARTERECTOMY: SHX5162

## 2013-01-19 LAB — CBC
Hemoglobin: 11.1 g/dL — ABNORMAL LOW (ref 12.0–15.0)
MCH: 29.4 pg (ref 26.0–34.0)
MCHC: 33.4 g/dL (ref 30.0–36.0)
MCV: 88.1 fL (ref 78.0–100.0)

## 2013-01-19 LAB — CREATININE, SERUM: Creatinine, Ser: 0.72 mg/dL (ref 0.50–1.10)

## 2013-01-19 SURGERY — ENDARTERECTOMY, CAROTID
Anesthesia: General | Site: Neck | Laterality: Right | Wound class: Clean

## 2013-01-19 MED ORDER — LIDOCAINE HCL 4 % MT SOLN
OROMUCOSAL | Status: DC | PRN
  Administered 2013-01-19: 4 mL via TOPICAL

## 2013-01-19 MED ORDER — HYDRALAZINE HCL 20 MG/ML IJ SOLN: 10.0000 mg | INTRAMUSCULAR | Status: DC | PRN

## 2013-01-19 MED ORDER — ACETAMINOPHEN 325 MG PO TABS: 325.0000 mg | ORAL_TABLET | ORAL | Status: DC | PRN

## 2013-01-19 MED ORDER — MORPHINE SULFATE 2 MG/ML IJ SOLN: 2.0000 mg | INTRAMUSCULAR | Status: DC | PRN

## 2013-01-19 MED ORDER — PANTOPRAZOLE SODIUM 40 MG PO TBEC
40.0000 mg | DELAYED_RELEASE_TABLET | Freq: Every day | ORAL | Status: DC
  Administered 2013-01-19 – 2013-01-20 (×2): 40 mg via ORAL
  Filled 2013-01-19 (×2): qty 1

## 2013-01-19 MED ORDER — SODIUM CHLORIDE 0.9 % IR SOLN
Status: DC | PRN
  Administered 2013-01-19: 08:00:00

## 2013-01-19 MED ORDER — FAMOTIDINE 40 MG PO TABS
40.0000 mg | ORAL_TABLET | Freq: Every day | ORAL | Status: DC
  Filled 2013-01-19: qty 1

## 2013-01-19 MED ORDER — ASPIRIN EC 325 MG PO TBEC
325.0000 mg | DELAYED_RELEASE_TABLET | Freq: Every day | ORAL | Status: DC
  Administered 2013-01-19 – 2013-01-20 (×2): 325 mg via ORAL
  Filled 2013-01-19 (×2): qty 1

## 2013-01-19 MED ORDER — DEXTRAN 40 IN SALINE 10-0.9 % IV SOLN
INTRAVENOUS | Status: DC | PRN
  Administered 2013-01-19: 500 mL

## 2013-01-19 MED ORDER — SIMVASTATIN 10 MG PO TABS
10.0000 mg | ORAL_TABLET | Freq: Every day | ORAL | Status: DC
  Administered 2013-01-19: 10 mg via ORAL
  Filled 2013-01-19 (×2): qty 1

## 2013-01-19 MED ORDER — FENTANYL CITRATE 0.05 MG/ML IJ SOLN
INTRAMUSCULAR | Status: DC | PRN
  Administered 2013-01-19: 75 ug via INTRAVENOUS

## 2013-01-19 MED ORDER — DEXTRAN 40 IN SALINE 10-0.9 % IV SOLN
INTRAVENOUS | Status: AC
  Filled 2013-01-19: qty 500

## 2013-01-19 MED ORDER — ACETAMINOPHEN 650 MG RE SUPP: 325.0000 mg | RECTAL | Status: DC | PRN

## 2013-01-19 MED ORDER — ONDANSETRON HCL 4 MG/2ML IJ SOLN
INTRAMUSCULAR | Status: DC | PRN
  Administered 2013-01-19: 4 mg via INTRAVENOUS

## 2013-01-19 MED ORDER — ALUM & MAG HYDROXIDE-SIMETH 200-200-20 MG/5ML PO SUSP: 15.0000 mL | ORAL | Status: DC | PRN

## 2013-01-19 MED ORDER — METOPROLOL TARTRATE 1 MG/ML IV SOLN: 2.0000 mg | INTRAVENOUS | Status: DC | PRN

## 2013-01-19 MED ORDER — PHENOL 1.4 % MT LIQD: 1.0000 | OROMUCOSAL | Status: DC | PRN

## 2013-01-19 MED ORDER — THROMBIN 20000 UNITS EX SOLR
CUTANEOUS | Status: AC
  Filled 2013-01-19: qty 20000

## 2013-01-19 MED ORDER — PROPOFOL 10 MG/ML IV BOLUS
INTRAVENOUS | Status: DC | PRN
  Administered 2013-01-19: 150 mg via INTRAVENOUS
  Administered 2013-01-19: 50 mg via INTRAVENOUS

## 2013-01-19 MED ORDER — GLYCOPYRROLATE 0.2 MG/ML IJ SOLN
INTRAMUSCULAR | Status: DC | PRN
  Administered 2013-01-19: 0.4 mg via INTRAVENOUS

## 2013-01-19 MED ORDER — LABETALOL HCL 5 MG/ML IV SOLN: 10.0000 mg | INTRAVENOUS | Status: DC | PRN

## 2013-01-19 MED ORDER — PHENYLEPHRINE HCL 10 MG/ML IJ SOLN
20.0000 mg | INTRAVENOUS | Status: DC | PRN
  Administered 2013-01-19: 50 ug/min via INTRAVENOUS

## 2013-01-19 MED ORDER — ENOXAPARIN SODIUM 40 MG/0.4ML ~~LOC~~ SOLN
40.0000 mg | SUBCUTANEOUS | Status: DC
  Filled 2013-01-19: qty 0.4

## 2013-01-19 MED ORDER — LOSARTAN POTASSIUM 50 MG PO TABS
50.0000 mg | ORAL_TABLET | Freq: Every day | ORAL | Status: DC
Start: 2013-01-20 — End: 2013-01-20
  Administered 2013-01-20: 50 mg via ORAL
  Filled 2013-01-19: qty 1

## 2013-01-19 MED ORDER — ROCURONIUM BROMIDE 100 MG/10ML IV SOLN
INTRAVENOUS | Status: DC | PRN
  Administered 2013-01-19: 50 mg via INTRAVENOUS

## 2013-01-19 MED ORDER — ONDANSETRON HCL 4 MG/2ML IJ SOLN: 4.0000 mg | Freq: Once | INTRAMUSCULAR | Status: DC | PRN

## 2013-01-19 MED ORDER — ARTIFICIAL TEARS OP OINT
TOPICAL_OINTMENT | OPHTHALMIC | Status: DC | PRN
  Administered 2013-01-19: 1 via OPHTHALMIC

## 2013-01-19 MED ORDER — LACTATED RINGERS IV SOLN
INTRAVENOUS | Status: DC | PRN
  Administered 2013-01-19: 07:00:00 via INTRAVENOUS

## 2013-01-19 MED ORDER — NEOSTIGMINE METHYLSULFATE 1 MG/ML IJ SOLN
INTRAMUSCULAR | Status: DC | PRN
  Administered 2013-01-19: 3 mg via INTRAVENOUS

## 2013-01-19 MED ORDER — ZOLPIDEM TARTRATE 5 MG PO TABS: 5.0000 mg | ORAL_TABLET | Freq: Every evening | ORAL | Status: DC | PRN

## 2013-01-19 MED ORDER — DOCUSATE SODIUM 100 MG PO CAPS
100.0000 mg | ORAL_CAPSULE | Freq: Every day | ORAL | Status: DC
  Administered 2013-01-20: 100 mg via ORAL
  Filled 2013-01-19: qty 1

## 2013-01-19 MED ORDER — PANTOPRAZOLE SODIUM 40 MG PO TBEC: 40.0000 mg | DELAYED_RELEASE_TABLET | Freq: Every day | ORAL | Status: DC

## 2013-01-19 MED ORDER — SODIUM CHLORIDE 0.9 % IV SOLN: 500.0000 mL | Freq: Once | INTRAVENOUS | Status: AC | PRN

## 2013-01-19 MED ORDER — SODIUM CHLORIDE 0.9 % IV SOLN
INTRAVENOUS | Status: DC
  Administered 2013-01-19: 75 mL/h via INTRAVENOUS

## 2013-01-19 MED ORDER — ONDANSETRON HCL 4 MG/2ML IJ SOLN
4.0000 mg | Freq: Four times a day (QID) | INTRAMUSCULAR | Status: DC | PRN
  Filled 2013-01-19: qty 2

## 2013-01-19 MED ORDER — PHENYLEPHRINE HCL 10 MG/ML IJ SOLN
INTRAMUSCULAR | Status: DC | PRN
  Administered 2013-01-19 (×2): 50 ug via INTRAVENOUS

## 2013-01-19 MED ORDER — POTASSIUM CHLORIDE CRYS ER 20 MEQ PO TBCR: 20.0000 meq | EXTENDED_RELEASE_TABLET | Freq: Once | ORAL | Status: AC | PRN

## 2013-01-19 MED ORDER — HEPARIN SODIUM (PORCINE) 1000 UNIT/ML IJ SOLN
INTRAMUSCULAR | Status: DC | PRN
  Administered 2013-01-19: 8 mL via INTRAVENOUS

## 2013-01-19 MED ORDER — OXYCODONE HCL 5 MG PO TABS: 5.0000 mg | ORAL_TABLET | Freq: Four times a day (QID) | ORAL | Status: DC | PRN

## 2013-01-19 MED ORDER — THROMBIN 20000 UNITS EX KIT
PACK | CUTANEOUS | Status: DC | PRN
  Administered 2013-01-19: 09:00:00 via TOPICAL

## 2013-01-19 MED ORDER — DEXTROSE 5 % IV SOLN
1.5000 g | Freq: Two times a day (BID) | INTRAVENOUS | Status: AC
  Administered 2013-01-19 – 2013-01-20 (×2): 1.5 g via INTRAVENOUS
  Filled 2013-01-19 (×3): qty 1.5

## 2013-01-19 MED ORDER — OXYCODONE HCL 5 MG PO TABS: 5.0000 mg | ORAL_TABLET | ORAL | Status: DC | PRN

## 2013-01-19 MED ORDER — LIDOCAINE HCL (PF) 1 % IJ SOLN
INTRAMUSCULAR | Status: AC
  Filled 2013-01-19: qty 30

## 2013-01-19 MED ORDER — EPHEDRINE SULFATE 50 MG/ML IJ SOLN
INTRAMUSCULAR | Status: DC | PRN
  Administered 2013-01-19 (×2): 10 mg via INTRAVENOUS

## 2013-01-19 MED ORDER — 0.9 % SODIUM CHLORIDE (POUR BTL) OPTIME
TOPICAL | Status: DC | PRN
  Administered 2013-01-19: 1000 mL

## 2013-01-19 MED ORDER — GUAIFENESIN-DM 100-10 MG/5ML PO SYRP: 15.0000 mL | ORAL_SOLUTION | ORAL | Status: DC | PRN

## 2013-01-19 MED ORDER — LIDOCAINE HCL (CARDIAC) 20 MG/ML IV SOLN
INTRAVENOUS | Status: DC | PRN
  Administered 2013-01-19: 100 mg via INTRAVENOUS
  Administered 2013-01-19: 50 mg via INTRAVENOUS

## 2013-01-19 MED ORDER — DOPAMINE-DEXTROSE 3.2-5 MG/ML-% IV SOLN: 3.0000 ug/kg/min | INTRAVENOUS | Status: DC

## 2013-01-19 MED ORDER — LORATADINE 10 MG PO TABS
10.0000 mg | ORAL_TABLET | Freq: Every day | ORAL | Status: DC | PRN
  Filled 2013-01-19: qty 1

## 2013-01-19 MED ORDER — PROTAMINE SULFATE 10 MG/ML IV SOLN
INTRAVENOUS | Status: DC | PRN
  Administered 2013-01-19: 20 mg via INTRAVENOUS
  Administered 2013-01-19: 10 mg via INTRAVENOUS

## 2013-01-19 MED ORDER — HYDROMORPHONE HCL PF 1 MG/ML IJ SOLN: 0.2500 mg | INTRAMUSCULAR | Status: DC | PRN

## 2013-01-19 MED ORDER — CLOPIDOGREL BISULFATE 75 MG PO TABS
75.0000 mg | ORAL_TABLET | Freq: Every day | ORAL | Status: DC
  Administered 2013-01-20: 75 mg via ORAL
  Filled 2013-01-19 (×2): qty 1

## 2013-01-19 SURGICAL SUPPLY — 52 items
BAG DECANTER FOR FLEXI CONT (MISCELLANEOUS) ×2 IMPLANT
CANISTER SUCTION 2500CC (MISCELLANEOUS) ×2 IMPLANT
CATH ROBINSON RED A/P 18FR (CATHETERS) ×2 IMPLANT
CATH SUCT 10FR WHISTLE TIP (CATHETERS) ×2 IMPLANT
CLIP TI MEDIUM 24 (CLIP) ×2 IMPLANT
CLIP TI WIDE RED SMALL 24 (CLIP) ×2 IMPLANT
CLOTH BEACON ORANGE TIMEOUT ST (SAFETY) ×2 IMPLANT
COVER PROBE W GEL 5X96 (DRAPES) ×2 IMPLANT
COVER SURGICAL LIGHT HANDLE (MISCELLANEOUS) ×2 IMPLANT
CRADLE DONUT ADULT HEAD (MISCELLANEOUS) ×2 IMPLANT
DERMABOND ADHESIVE PROPEN (GAUZE/BANDAGES/DRESSINGS) ×1
DERMABOND ADVANCED (GAUZE/BANDAGES/DRESSINGS) ×1
DERMABOND ADVANCED .7 DNX12 (GAUZE/BANDAGES/DRESSINGS) ×1 IMPLANT
DERMABOND ADVANCED .7 DNX6 (GAUZE/BANDAGES/DRESSINGS) ×1 IMPLANT
DRAPE WARM FLUID 44X44 (DRAPE) ×2 IMPLANT
ELECT REM PT RETURN 9FT ADLT (ELECTROSURGICAL) ×2
ELECTRODE REM PT RTRN 9FT ADLT (ELECTROSURGICAL) ×1 IMPLANT
GLOVE BIO SURGEON STRL SZ 6.5 (GLOVE) ×4 IMPLANT
GLOVE BIO SURGEON STRL SZ7 (GLOVE) ×2 IMPLANT
GLOVE BIOGEL PI IND STRL 7.5 (GLOVE) ×1 IMPLANT
GLOVE BIOGEL PI INDICATOR 7.5 (GLOVE) ×1
GLOVE ECLIPSE 6.5 STRL STRAW (GLOVE) ×2 IMPLANT
GLOVE SURG SS PI 7.0 STRL IVOR (GLOVE) ×2 IMPLANT
GOWN STRL NON-REIN LRG LVL3 (GOWN DISPOSABLE) IMPLANT
KIT BASIN OR (CUSTOM PROCEDURE TRAY) ×2 IMPLANT
KIT ROOM TURNOVER OR (KITS) ×2 IMPLANT
NS IRRIG 1000ML POUR BTL (IV SOLUTION) ×4 IMPLANT
PACK CAROTID (CUSTOM PROCEDURE TRAY) ×2 IMPLANT
PAD ARMBOARD 7.5X6 YLW CONV (MISCELLANEOUS) ×4 IMPLANT
PATCH VASCULAR VASCU GUARD 1X6 (Vascular Products) ×2 IMPLANT
SET COLLECT BLD 21X3/4 12 (NEEDLE) ×2 IMPLANT
SET COLLECT BLD 21X3/4 12 PB (MISCELLANEOUS) ×2 IMPLANT
SHUNT CAROTID BYPASS 10 (VASCULAR PRODUCTS) IMPLANT
SHUNT CAROTID BYPASS 12FRX15.5 (VASCULAR PRODUCTS) IMPLANT
SPONGE SURGIFOAM ABS GEL 100 (HEMOSTASIS) ×2 IMPLANT
STOPCOCK 4 WAY LG BORE MALE ST (IV SETS) ×2 IMPLANT
SUT ETHILON 3 0 PS 1 (SUTURE) IMPLANT
SUT MNCRL AB 4-0 PS2 18 (SUTURE) ×2 IMPLANT
SUT PROLENE 6 0 BV (SUTURE) ×4 IMPLANT
SUT PROLENE 7 0 BV 1 (SUTURE) IMPLANT
SUT SILK 3 0 TIES 17X18 (SUTURE)
SUT SILK 3-0 18XBRD TIE BLK (SUTURE) IMPLANT
SUT VIC AB 3-0 SH 27 (SUTURE) ×1
SUT VIC AB 3-0 SH 27X BRD (SUTURE) ×1 IMPLANT
SYR TB 1ML LUER SLIP (SYRINGE) IMPLANT
SYSTEM CHEST DRAIN TLS 7FR (DRAIN) IMPLANT
TOWEL OR 17X24 6PK STRL BLUE (TOWEL DISPOSABLE) ×2 IMPLANT
TOWEL OR 17X26 10 PK STRL BLUE (TOWEL DISPOSABLE) ×2 IMPLANT
TRAY FOLEY CATH 16FRSI W/METER (SET/KITS/TRAYS/PACK) IMPLANT
TUBING ART PRESS 48 MALE/FEM (TUBING) IMPLANT
TUBING EXTENTION W/L.L. (IV SETS) IMPLANT
WATER STERILE IRR 1000ML POUR (IV SOLUTION) ×2 IMPLANT

## 2013-01-19 NOTE — Addendum Note (Signed)
Addendum created 01/19/13 1127 by Tyrone Nine, CRNA   Modules edited: Anesthesia Medication Administration

## 2013-01-19 NOTE — Telephone Encounter (Addendum)
Message copied by Rosalyn Charters on Mon Jan 19, 2013 10:34 AM ------      Message from: Lorin Mercy K      Created: Mon Jan 19, 2013  9:49 AM      Regarding: schedule                   ----- Message -----         From: Dara Lords, PA-C         Sent: 01/19/2013   9:46 AM           To: Sharee Pimple, CMA            S/p right CEA 01/19/13.  F/u with Dr. Imogene Burn in 2 weeks.            Thanks,      Lelon Mast ------  notified patient of fu appt. with blc on 02-06-13 8:45

## 2013-01-19 NOTE — Transfer of Care (Signed)
Immediate Anesthesia Transfer of Care Note  Patient: Ellen Baker  Procedure(s) Performed: Procedure(s): ENDARTERECTOMY CAROTID with patch angioplasty (Right)  Patient Location: PACU  Anesthesia Type:General  Level of Consciousness: awake, alert , oriented and patient cooperative  Airway & Oxygen Therapy: Patient Spontanous Breathing and Patient connected to nasal cannula oxygen  Post-op Assessment: Report given to PACU RN and Post -op Vital signs reviewed and stable  Post vital signs: Reviewed and stable  Complications: No apparent anesthesia complications

## 2013-01-19 NOTE — Preoperative (Signed)
Beta Blockers   Reason not to administer Beta Blockers:Not Applicable 

## 2013-01-19 NOTE — Progress Notes (Signed)
Patient received to room 3304 from PACU alert and oriented. Vitals obtained and stable. Family at bedside. Patient instructed to call for assistance when getting out of bed.

## 2013-01-19 NOTE — Anesthesia Preprocedure Evaluation (Addendum)
Anesthesia Evaluation  Patient identified by MRN, date of birth, ID band Patient awake    Reviewed: Allergy & Precautions, H&P , NPO status , Patient's Chart, lab work & pertinent test results  Airway Mallampati: III TM Distance: <3 FB Neck ROM: Full  Mouth opening: Limited Mouth Opening  Dental  (+) Teeth Intact and Dental Advisory Given   Pulmonary neg pulmonary ROS,  7-1The heart is normal in size; the mediastinal contour is within normal limits.  No acute osseous abnormalities are seen.   IMPRESSION: Minimal left basilar airspace opacity likely reflects atelectasis; lungs otherwise clear.  6-14 Chest X-ray    Pulmonary exam normal       Cardiovascular Exercise Tolerance: Good hypertension, Pt. on medications + Peripheral Vascular Disease Rhythm:Regular Rate:Normal  16-Jan-2013 14:03:20 [facility] ROUTINE RECORD Sinus bradycardia with sinus arrhythmia Left axis deviation Low voltage QRS Anterior infarct , age undetermined Abnormal ECG No significant change since last tracing  2 D ECHO  7------------------------------------------------------------ Study Conclusions  - Left ventricle: The cavity size was normal. Systolic   function was normal. The estimated ejection fraction was   in the range of 50% to 55%. Wall motion was normal; there   were no regional wall motion abnormalities. - Pulmonary arteries: Systolic pressure was mildly   increased. PA peak pressure: 31mm Hg (S). Impressions: -17-14     Neuro/Psych December 31, 2012 Left sided weakness,  Resolved 90% Right Carotid Stenosis TIANo Residual Symptoms negative psych ROS   GI/Hepatic negative GI ROS, Neg liver ROS, GERD-  Medicated and Controlled,  Endo/Other  negative endocrine ROS  Renal/GU negative Renal ROS  negative genitourinary   Musculoskeletal  (+) Arthritis -, Osteoarthritis,    Abdominal Normal abdominal exam  (+)    Peds negative pediatric ROS (+)  Hematology negative hematology ROS (+)   Anesthesia Other Findings Narrow palate  Reproductive/Obstetrics                      Anesthesia Physical Anesthesia Plan  ASA: III  Anesthesia Plan:    Post-op Pain Management:    Induction:   Airway Management Planned:   Additional Equipment:   Intra-op Plan:   Post-operative Plan:   Informed Consent:   Plan Discussed with:   Anesthesia Plan Comments:         Anesthesia Quick Evaluation

## 2013-01-19 NOTE — Interval H&P Note (Signed)
[facility] [place]  History and Physical Update  The patient was interviewed and re-examined.  The patient's previous History and Physical has been reviewed and is unchanged. There is no change in the plan of care: R CEA.  Leonides Sake, MD [facility] [place] Office: [phone] Pager: [phone]  01/19/2013, 7:26 AM

## 2013-01-19 NOTE — Consult Note (Signed)
ANTIBIOTIC CONSULT NOTE - INITIAL  Pharmacy Consult for : Zinacef Indication: Post op antibiotic prophylaxsis  Allergies Adhesive  CrCl: Estimated Creatinine Clearance: 84.1 ml/min (by C-G formula based on Cr of 0.72).  Antibiotics:   cefUROXime (ZINACEF)  IV 1.5 g Intravenous Q12H    Assessment:  62 y/o female s/p R-CEA placed on Zinacef x 2 doses post-op.  CrCl 84 ml/min.  Goal of Therapy:   Renal Adjustment of Antibiotics  Plan:   Continue Zinacef 1.5 gm IV q 12 hours x 2 doses.  No dose adjustments required.  Pharmacy will sign off at this time, please call for further questions  Laurena Bering,  Pharm.D.  01/19/2013 4:57 PM

## 2013-01-19 NOTE — H&P (View-Only) (Signed)
[facility] [place]  Established Carotid Patient  History of Present Illness  Ellen Baker is a 62 y.o. (08/21/1950) female who presents with chief complaint: pre-operative follow-up.  Previous CTA demonstrated: RICA 90% stenosis, LICA <50% stenosis.  Patient has known history of R brain based TIA  Symptoms (01/01/13).  The patient has never had amaurosis fugax or monocular blindness.  The patient has no had facial drooping or hemiplegia.  The patient has never had receptive or expressive aphasia.  The patient's previous neurologic deficits have resolved: numbness in LUE x 2 and LLE with development of motor weakness in LLE with foot drag.  The patient was seen by Dr. Nahser while admitted and cleared for surgery.  The patient returns for preoperative counseling.  Past Medical History  Diagnosis Date  . Hypertension   . Heart murmur   . GERD (gastroesophageal reflux disease)   . Arthritis     right knee  . Stroke 12-31-12    Past Surgical History  Procedure Laterality Date  . Tubal ligation    . Colonoscopy  08-18-12  . Upper gi endoscopy  08-18-12    History   Social History  . Marital Status: Widowed    Spouse Name: N/A    Number of Children: N/A  . Years of Education: N/A   Occupational History  . Not on file.   Social History Main Topics  . Smoking status: Never Smoker   . Smokeless tobacco: Never Used  . Alcohol Use: No  . Drug Use: No  . Sexually Active: Not on file   Other Topics Concern  . Not on file   Social History Narrative  . No narrative on file    Family History  Problem Relation Age of Onset  . Stroke Mother   . Deep vein thrombosis Mother   . Heart disease Mother     Amputation-  . Hypertension Mother   . Alzheimer's disease Mother   . Heart failure Father   . Heart disease Father   . Hypertension Father     Current Outpatient Prescriptions on File Prior to Visit  Medication Sig Dispense Refill  . Calcium  Carb-Ergocalciferol (CHEWABLE CALCIUM/D PO) Take 1 tablet by mouth daily.      . Cholecalciferol (VITAMIN D PO) Take 1 tablet by mouth daily.      . clopidogrel (PLAVIX) 75 MG tablet Take 1 tablet (75 mg total) by mouth daily with breakfast.  30 tablet  0  . famotidine (PEPCID) 40 MG tablet Take 1 tablet (40 mg total) by mouth daily.  30 tablet  0  . losartan (COZAAR) 50 MG tablet Take 50 mg by mouth daily.      . Menthol, Topical Analgesic, (ICY HOT EX) Apply 1 application topically 2 (two) times daily as needed (knee pain).      . simvastatin (ZOCOR) 10 MG tablet Take 1 tablet (10 mg total) by mouth daily at 6 PM.  30 tablet  0  . UNABLE TO FIND Ms.Mclean was admitted to [facility] from 7/16-7/18 and will be able to return to work from Monday 7/21  1 %  0   No current facility-administered medications on file prior to visit.    Allergies  Allergen Reactions  . Adhesive (Tape) Rash    ROS: [x] Positive [ ] Denies  General: [ ] Weight loss, [ ] Fever, [ ] chills  Neurologic: [ ] Dizziness, [x] Blackouts, [ ] Seizure, [ ] Stroke, [x] "  Mini stroke", [ ] Slurred speech, [ ] Temporary blindness; [ ] weakness in arms or legs, [ ] Hoarseness  Cardiac: [ ] Chest pain/pressure, [ ] Shortness of breath at rest [ ] Shortness of breath with exertion, [ ] Atrial fibrillation or irregular heartbeat  Vascular: [ ] Pain in legs with walking, [ ] Pain in legs at rest, [ ] Pain in legs at night, [ ] Non-healing ulcer, [ ] Blood clot in vein/DVT,  Pulmonary: [ ] Home oxygen, [ ] Productive cough, [ ] Coughing up blood, [ ] Asthma, [ ] Wheezing  Musculoskeletal: [x] Arthritis, [ ] Low back pain, [ ] Joint pain  Hematologic: [ ] Easy Bruising, [ ] Anemia; [ ] Hepatitis  Gastrointestinal: [ ] Blood in stool, [ ] Gastroesophageal Reflux/heartburn, [x] Trouble swallowing due to esophageal stricture  Urinary: [ ] chronic Kidney disease, [ ] on HD - [ ] MWF or [ ] TTHS, [ ] Burning with urination, [ ] Difficulty  urinating  Skin: [ ] Rashes, [ ] Wounds  Psychological: [ ] Anxiety, [ ] Depression   For VQI Use Only  PRE-ADM LIVING: Home  AMB STATUS: Ambulatory  CAD Sx: None  PRIOR CHF: None  STRESS TEST: [x] No, [ ] Normal, [ ] + ischemia, [ ] + MI, [ ] Both  Physical Examination  Filed Vitals:   01/16/13 1147 01/16/13 1150  BP: 155/74 151/70  Pulse: 61 59  Resp: 16   Height: 5' 4.5" (1.638 m)   Weight: 210 lb (95.255 kg)   SpO2: 100% 100%   Body mass index is 35.5 kg/(m^2).  General: A&O x 3, WD, Obese   Eyes: PERRLA, EOMI   Neck: Supple, no nuchal rigidity, no palpable LAD   Pulmonary: Sym exp, good air movt, CTAB, no rales, rhonchi, & wheezing   Cardiac: RRR, Nl S1, S2, no Murmurs, rubs or gallops   Vascular:  Vessel  Right  Left   Radial  Palpable  Palpable   Brachial  Palpable  Palpable   Carotid  Palpable, without bruit  Palpable, without bruit   Aorta  Not palpable  N/A   Femoral  Palpable  Palpable   Popliteal  Not palpable  Not palpable   PT  Palpable  Palpable   DP  Palpable  Palpable    Gastrointestinal: soft, NTND, -G/R, - HSM, - masses, - CVAT B   Musculoskeletal: M/S 5/5 throughout , Extremities without ischemic changes   Neurologic: CN 2-12 intact , Pain and light touch intact in extremities , Motor exam as listed above   Medical Decision Making  Ellen Baker is a 62 y.o. female who presents with: R sx ICA stenosis 90%.   Based on the patient's vascular studies and examination, I have offered the patient: R CEA. I discussed with the patient the risks, benefits, and alternatives to carotid endarterectomy.   The patient is aware that the risks of carotid endarterectomy include but are not limited to: bleeding, infection, stroke, myocardial infarction, death, cranial nerve injuries both temporary and permanent, neck hematoma, possible airway compromise, labile blood pressure post-operatively, cerebral hyperperfusion syndrome, and possible need for  additional interventions in the future.  The patient is aware of the risks and agrees to proceed forward with the procedure.  I discussed in depth with the patient the nature of atherosclerosis, and emphasized the importance of maximal medical management including strict control of blood pressure, blood glucose, and lipid levels, antiplatelet agents, obtaining regular   exercise, and cessation of smoking.    The patient is aware that without maximal medical management the underlying atherosclerotic disease process will progress, limiting the benefit of any interventions. The patient is currently on a statin: Zocor.    The patient is currently on an anti-platelet: Plavix.    Thank you for allowing us to participate in this patient's care.  Sharea Guinther, MD [facility] [place] Office: [phone] Pager: [phone]  01/16/2013, 12:24 PM     

## 2013-01-19 NOTE — Anesthesia Postprocedure Evaluation (Signed)
  Anesthesia Post-op Note  Patient: Teaching laboratory technician  Procedure(s) Performed: Procedure(s): ENDARTERECTOMY CAROTID with patch angioplasty (Right)  Patient Location: PACU  Anesthesia Type:General  Level of Consciousness: awake, alert , oriented and patient cooperative  Airway and Oxygen Therapy: Patient Spontanous Breathing and Patient connected to nasal cannula oxygen  Post-op Pain: mild  Post-op Assessment: Post-op Vital signs reviewed, Patient's Cardiovascular Status Stable, Respiratory Function Stable, Patent Airway, No signs of Nausea or vomiting and Pain level controlled  Post-op Vital Signs: stable  Complications: No apparent anesthesia complications

## 2013-01-19 NOTE — Op Note (Signed)
OPERATIVE NOTE  PROCEDURE:   1.  right carotid endarterectomy with bovine patch angioplasty 2.  right intraoperative carotid ultrasound  PRE-OPERATIVE DIAGNOSIS: right symptomatic carotid stenosis ~90%  POST-OPERATIVE DIAGNOSIS: same as above   SURGEON: Leonides Sake, MD  ASSISTANT(S): Doreatha Massed, PAC   ANESTHESIA: general  ESTIMATED BLOOD LOSS: 50 cc  FINDING(S): 1.  Continuous doppler audible flow signatures are appropriate for each carotid artery. 2.  No evidence of intimal flap visualized on transverse or longitudinal ultrasonography. 3.  Non-ulcerated, near occluding carotid plaque.  SPECIMEN(S):  Carotid plaque (sent to Pathology)  INDICATIONS:   Ellen Baker is a 62 y.o. female who presents with right symptomatic carotid stenosis ~90%.  I discussed with the patient the risks, benefits, and alternatives to carotid endarterectomy.  I discussed the procedural details of carotid endarterectomy with the patient.  The patient is aware that the risks of carotid endarterectomy include but are not limited to: bleeding, infection, stroke, myocardial infarction, death, cranial nerve injuries both temporary and permanent, neck hematoma, possible airway compromise, labile blood pressure post-operatively, cerebral hyperperfusion syndrome, and possible need for additional interventions in the future. The patient is aware of the risks and agrees to proceed forward with the procedure.  DESCRIPTION: After full informed written consent was obtained from the patient, the patient was brought back to the operating room and placed supine upon the operating table.  Prior to induction, the patient received IV antibiotics.  After obtaining adequate anesthesia, the patient was placed into semi-Fowler position with a shoulder roll in place and the patient's neck slightly hyperextended and rotated away from the surgical site.  The patient was prepped in the standard fashion for a right carotid  endarterectomy.  I made an incision anterior to the sternocleidomastoid muscle and dissected down through the subcutaneous tissue.  The platysmas was opened with electrocautery.  Then I dissected down to the internal jugular vein.  This was dissected posteriorly until I obtained visualization of the common carotid artery.  This was dissected out and then an umbilical tape was placed around the common carotid artery and I loosely applied a Rumel tourniquet.  I then dissected in a periadventitial fashion along the common carotid artery up to the bifurcation.  I then identified the external carotid artery and the superior thyroid artery.  A 2-0 silk tie was looped around the superior thyroid artery, and I also dissected out the external carotid artery and placed a vessel loop around it.  In continuing the dissection to the internal carotid artery, I identified the facial vein.  This was ligated and then transected, giving me improved exposure of the internal carotid artery.  In the process of this dissection, the hypoglossal nerve was identified.  I then dissected out the internal carotid artery until I identified an area of soft tissue in the internal carotid artery.  Due to the high carotid bifurcation, I did not think a Rumel tourniquet could be applied to the internal carotid artery.   I dissected slightly distal to relatively disease free portion of the internal carotid artery and placed a vessel loop around the artery.  At this point, we gave the patient a therapeutic bolus of Heparin intravenously (roughly 80 units/kg).  After waiting 3 minutes, then I clamped the external carotid artery and then the common carotid artery.  Using a butterfly needle connected to the arterial pressure circuit, I cannulated the common carotid artery distal to the clamp.  The stump pressure was measured at: 80 mm Hg.  Based on this measurement, I felt no shunt was needed.  I then made an arteriotomy in the common carotid artery with  a 11 blade, and extended the arteriotomy with a Potts scissor down into the common carotid artery, then I carried the arteriotomy through the bifurcation into the internal carotid artery until I reached an area that was not diseased.  At this point, I started the endarterectomy in the common carotid artery with a Cytogeneticist and carried this dissection down into the common carotid artery circumferentially.  Then I transected the plaque at a segment where it was adherent.  I then carried this dissection up into the external carotid artery.  The plaque was extracted by unclamping the external carotid artery and everting the artery.  The dissection was then carried into the internal carotid artery, extracting the remaining portion of the carotid plaque.  I passed the plaque off the field as a specimen.  I then spent the next 30 minutes removing intimal flaps and loose debris.  Eventually I reached the point where the residual plaque was densely adherent and any further dissection would compromise the integrity of the wall.  After verifying that there was no more loose intimal flaps or debris, I re-interrogated the entirety of this carotid artery.  At this point, I was satisfied that the minimal remaining disease was densely adherent to the wall and wall integrity was intact.  At this point, I then fashioned a bovine pericardial patch for the geometry of this artery and sewed it in place with two running stitch of 6-0 Prolene, one from each end.  Prior to completing this patch angioplasty, I backbled the external carotid artery and then reclamped it.  Back bleeding was: excellent.   I then backbled the internal carotid artery and then reclamped it.  Back bleeding was: excellent.   Finally, I backbled the common carotid artery and then reclamped it.  Back bleeding was: excellent.   I completed the patch angioplasty in the usual fashion.  First, I released the clamp on the external carotid artery, then I released  it on the common carotid artery.  After waiting a few seconds, I then released it on the internal carotid artery.  I then interrogated this patient's arteries with the continuous Doppler.  The audible waveforms in each artery were consistent with the expected characteristics for each artery.  The Sonosite probe was then sterilely draped and used to interrogate the carotid artery in both longitudinal and transverse views.  At this point, I washed out the wound, and placed thrombin and Gelfoam throughout.  I also gave the patient 30 mg of protamine to reverse his anticoagulation.   After waiting a few minutes, I removed the thrombin and Gelfoam and washed out the wound.  There was no more active bleeding in the surgical site.   I then reapproximated the platysma muscle with a running stitch of 3-0 Vicryl.  The skin was then reapproximated with a running subcuticular 4-0 Monocryl stitch.  The skin was then cleaned, dried and Dermabond was used to reinforce the skin closure.  The patient woke without any problems, neurologically intact.     COMPLICATIONS: none  CONDITION: stable  Leonides Sake, MD [facility] [place] Office: [phone] Pager: [phone]  01/19/2013, 9:42 AM

## 2013-01-19 NOTE — Anesthesia Procedure Notes (Signed)
Procedure Name: Intubation Date/Time: 01/19/2013 7:34 AM Performed by: Tyrone Nine Pre-anesthesia Checklist: Patient identified, Timeout performed, Emergency Drugs available, Suction available and Patient being monitored Patient Re-evaluated:Patient Re-evaluated prior to inductionOxygen Delivery Method: Circle system utilized Preoxygenation: Pre-oxygenation with 100% oxygen Intubation Type: IV induction Ventilation: Mask ventilation without difficulty Laryngoscope Size: Mac and 3 Grade View: Grade I Tube type: Oral Tube size: 7.5 mm Number of attempts: 1 Airway Equipment and Method: Stylet Placement Confirmation: positive ETCO2,  ETT inserted through vocal cords under direct vision and breath sounds checked- equal and bilateral Secured at: 21 cm Tube secured with: Tape Dental Injury: Teeth and Oropharynx as per pre-operative assessment

## 2013-01-20 ENCOUNTER — Encounter (HOSPITAL_COMMUNITY): Payer: Self-pay | Admitting: Vascular Surgery

## 2013-01-20 LAB — CBC
HCT: 31.9 % — ABNORMAL LOW (ref 36.0–46.0)
Hemoglobin: 10.7 g/dL — ABNORMAL LOW (ref 12.0–15.0)
MCH: 30.1 pg (ref 26.0–34.0)
MCV: 89.6 fL (ref 78.0–100.0)
Platelets: 154 10*3/uL (ref 150–400)
RBC: 3.56 MIL/uL — ABNORMAL LOW (ref 3.87–5.11)
WBC: 5.5 10*3/uL (ref 4.0–10.5)

## 2013-01-20 LAB — BASIC METABOLIC PANEL
BUN: 5 mg/dL — ABNORMAL LOW (ref 6–23)
CO2: 28 mEq/L (ref 19–32)
Calcium: 8.2 mg/dL — ABNORMAL LOW (ref 8.4–10.5)
Glucose, Bld: 98 mg/dL (ref 70–99)
Sodium: 141 mEq/L (ref 135–145)

## 2013-01-20 MED ORDER — POTASSIUM CHLORIDE ER 10 MEQ PO TBCR
10.0000 meq | EXTENDED_RELEASE_TABLET | Freq: Once | ORAL | Status: AC
  Administered 2013-01-20: 10 meq via ORAL
  Filled 2013-01-20: qty 1

## 2013-01-20 NOTE — Discharge Summary (Signed)
[facility] Discharge Summary  Ellen Baker [birth year] 62 y.o. female  [id number]  Admission Date: 01/19/2013  Discharge Date: 01/20/13/  Physician: Fransisco Hertz, MD  Admission Diagnosis: RIGHT ICA STENOSIS,CVA   HPI:   This is a 62 y.o. female who presents with chief complaint: pre-operative follow-up. Previous CTA demonstrated: RICA 90% stenosis, LICA <50% stenosis. Patient has known history of R brain based TIA Symptoms (01/01/13). The patient has never had amaurosis fugax or monocular blindness. The patient has no had facial drooping or hemiplegia. The patient has never had receptive or expressive aphasia. The patient's previous neurologic deficits have resolved: numbness in LUE x 2 and LLE with development of motor weakness in LLE with foot drag. The patient was seen by Dr. Elease Hashimoto while admitted and cleared for surgery. The patient returns for preoperative counseling.  Hospital Course:  The patient was admitted to the hospital and taken to the operating room on 01/19/2013 and underwent right carotid endarterectomy.  The pt tolerated the procedure well and was transported to the PACU in good condition.   By POD 1, the pt neuro status was in tact.  She was doing well and was discharged home.  The remainder of the hospital course consisted of increasing mobilization and increasing intake of solids without difficulty.    Recent Labs  01/20/13 0345  NA 141  K 3.6  CL 107  CO2 28  GLUCOSE 98  BUN 5*  CALCIUM 8.2*    Recent Labs  01/19/13 1045 01/20/13 0345  WBC 5.6 5.5  HGB 11.1* 10.7*  HCT 33.2* 31.9*  PLT 158 154   No results found for this basename: INR,  in the last 72 hours  Discharge Instructions:   The patient is discharged to home with extensive instructions on wound care and progressive ambulation.  They are instructed not to drive or perform any heavy lifting until returning to see the physician in his office.  Discharge Orders   Future  Appointments Provider Department Dept Phone   02/06/2013 8:45 AM Fransisco Hertz, MD [facility] [phone]   Future Orders Complete By Expires     CAROTID Sugery: Call MD for difficulty swallowing or speaking; weakness in arms or legs that is a new symtom; severe headache.  If you have increased swelling in the neck and/or  are having difficulty breathing, CALL [phone]  As directed     Call MD for:  redness, tenderness, or signs of infection (pain, swelling, bleeding, redness, odor or green/yellow discharge around incision site)  As directed     Call MD for:  severe or increased pain, loss or decreased feeling  in affected limb(s)  As directed     Call MD for:  temperature >100.5  As directed     Discharge wound care:  As directed     Comments:      Shower daily with soap and water starting 01/21/13    Driving Restrictions  As directed     Comments:      No driving for 2 weeks    Lifting restrictions  As directed     Comments:      No lifting for 2 weeks    Nursing communication  As directed     Scheduling Instructions:      Please give paper Rx to patient at discharge.  It is located on the paper chart.    Resume previous diet  As directed  Discharge Diagnosis:  RIGHT ICA STENOSIS,CVA  Secondary Diagnosis: Patient Active Problem List   Diagnosis Date Noted  . Occlusion and stenosis of carotid artery without mention of cerebral infarction 01/16/2013  . Pre-operative cardiovascular examination 01/16/2013  . Carotid artery stenosis, symptomatic 01/02/2013  . CVA (cerebral infarction) 12/31/2012  . HTN (hypertension) 12/31/2012  . GERD (gastroesophageal reflux disease) 12/31/2012   Past Medical History  Diagnosis Date  . Hypertension   . Heart murmur   . GERD (gastroesophageal reflux disease)   . Arthritis     right knee  . Stroke 12-31-12    no residual effects      Medication List         ALEVE PO  Take by mouth continuous as needed.      CHEWABLE CALCIUM/D PO  Take 1 tablet by mouth daily.     clopidogrel 75 MG tablet  Commonly known as:  PLAVIX  Take 1 tablet (75 mg total) by mouth daily with breakfast.     famotidine 40 MG tablet  Commonly known as:  PEPCID  Take 1 tablet (40 mg total) by mouth daily.     ICY HOT EX  Apply 1 application topically 2 (two) times daily as needed (knee pain).     loratadine 10 MG tablet  Commonly known as:  CLARITIN  Take 10 mg by mouth continuous as needed for allergies.     losartan 50 MG tablet  Commonly known as:  COZAAR  Take 50 mg by mouth daily.     oxyCODONE 5 MG immediate release tablet  Commonly known as:  ROXICODONE  Take 1 tablet (5 mg total) by mouth every 6 (six) hours as needed for pain.     simvastatin 10 MG tablet  Commonly known as:  ZOCOR  Take 1 tablet (10 mg total) by mouth daily at 6 PM.     UNABLE TO FIND  Ms.Haddox was admitted to [facility] from 7/16-7/18 and will be able to return to work from Monday 7/21     VITAMIN D PO  Take 1 tablet by mouth daily.        Roxicodone #30 No Refill  Disposition: home  Patient's condition: is Good  Follow up: 1. Dr.  Imogene Burn in 2 weeks.   Doreatha Massed, PA-C [facility] [phone]  Addendum  I have independently interviewed and examined the patient, and I agree with the physician assistant's discharge summary.  The patient underwent an uneventful right carotid endarterectomy for high grade right internal carotid artery stenosis >80%.  Post-operatively, her hospital course was uneventful.  On discharge, she is neurologically intact without any sign of neck hematoma.  Hemodynamics have been stable this morning and patient will be discharged with follow up in the office in two weeks.  Leonides Sake, MD [facility] [place] Office: [phone] Pager: [phone]  01/22/2013, 3:24 PM    --- For VQI Registry use --- Instructions: Press F2 to tab through  selections.  Delete question if not applicable.   Modified Rankin score at D/C (0-6): 0  IV medication needed for:  1. Hypertension: No 2. Hypotension: No  Post-op Complications: No  1. Post-op CVA or TIA: No  If yes: Event classification (right eye, left eye, right cortical, left cortical, verterobasilar, other): n/a  If yes: Timing of event (intra-op, <6 hrs post-op, >=6 hrs post-op, unknown): n/a  2. CN injury: No  If yes: CN n/a injuried   3. Myocardial infarction:  No  If yes: Dx by (EKG or clinical, Troponin): n/a  4.  CHF: No  5.  Dysrhythmia (new): No  6. Wound infection: No  7. Reperfusion symptoms: No  8. Return to OR: No  If yes: return to OR for (bleeding, neurologic, other CEA incision, other): n/a  Discharge medications: Statin use:  Yes If No: [ ]  For Medical reasons, [ ]  Non-compliant, [ ]  Not-indicated ASA use:  No  If No: [ ]  For Medical reasons, [ ]  Non-compliant, [ ]  Not-indicated Beta blocker use:  No If No: [ ]  For Medical reasons, [ ]  Non-compliant, [ ]  Not-indicated ACE-Inhibitor use:  No-she is on ARBIf No: [ ]  For Medical reasons, [ ]  Non-compliant, [ ]  Not-indicated P2Y12 Antagonist use: yes, [ x] Plavix, [ ]  Plasugrel, [ ]  Ticlopinine, [ ]  Ticagrelor, [ ]  Other, [ ]  No for medical reason, [ ]  Non-compliant, [ ]  Not-indicated Anti-coagulant use:  no, [ ]  Warfarin, [ ]  Rivaroxaban, [ ]  Dabigatran, [ ]  Other, [ ]  No for medical reason, [ ]  Non-compliant, [ ]  Not-indicated

## 2013-01-20 NOTE — Plan of Care (Signed)
Problem: Consults Goal: Diagnosis CEA/CES/AAA Stent Carotid Endarterectomy (CEA)     

## 2013-01-20 NOTE — Progress Notes (Signed)
Patient has been discharged home today. IV's have been D/C. Have reviewed all discharge instructions with the patient and daughter. All questions and concerns have been voiced.

## 2013-01-20 NOTE — Progress Notes (Addendum)
[facility] Progress Note  01/20/2013 7:23 AM 1 Day Post-Op  Subjective:  No complaints.  Ready to go home and sleep.  Tm 99 HR 50's-60's regular 100-120's systolic 96% RA  Filed Vitals:   01/20/13 0320  BP: 126/70  Pulse: 58  Temp: 99 F (37.2 C)  Resp: 14     Physical Exam: Neuro:  In tact Incision:  C/d/i without hematoma  CBC    Component Value Date/Time   WBC 5.5 01/20/2013 0345   RBC 3.56* 01/20/2013 0345   HGB 10.7* 01/20/2013 0345   HCT 31.9* 01/20/2013 0345   PLT 154 01/20/2013 0345   MCV 89.6 01/20/2013 0345   MCH 30.1 01/20/2013 0345   MCHC 33.5 01/20/2013 0345   RDW 13.2 01/20/2013 0345   LYMPHSABS 1.7 01/01/2013 0605   MONOABS 0.4 01/01/2013 0605   EOSABS 0.1 01/01/2013 0605   BASOSABS 0.0 01/01/2013 0605    BMET    Component Value Date/Time   NA 141 01/20/2013 0345   K 3.6 01/20/2013 0345   CL 107 01/20/2013 0345   CO2 28 01/20/2013 0345   GLUCOSE 98 01/20/2013 0345   BUN 5* 01/20/2013 0345   CREATININE 0.64 01/20/2013 0345   CALCIUM 8.2* 01/20/2013 0345   GFRNONAA >90 01/20/2013 0345   GFRAA >90 01/20/2013 0345     Intake/Output Summary (Last 24 hours) at 01/20/13 0723 Last data filed at 01/20/13 0400  Gross per 24 hour  Intake 3506.25 ml  Output   1150 ml  Net 2356.25 ml      Assessment/Plan:  This is a 62 y.o. female who is s/p  1. right carotid endarterectomy with bovine patch angioplasty  2. right intraoperative carotid ultrasound  1 Day Post-Op  -pt is doing well this am. -pt neuro exam is in tact -pt has ambulated -mild hypokalemia-will supplement -discharge pt home.  -she will f/u with Dr. Imogene Burn in 2 weeks.   Doreatha Massed, PA-C [facility] [phone]  Addendum  I have independently interviewed and examined the patient, and I agree with the physician assistant's findings.  No Neck hematoma, intact neuro, BP ok with ok HR.  No signs of cerebral hyperperfusion. Ok to D/C  Leonides Sake, MD [facility] [place] Office: [phone] Pager: [phone]  01/20/2013, 7:50 AM

## 2013-01-28 ENCOUNTER — Encounter: Payer: Self-pay | Admitting: Vascular Surgery

## 2013-01-29 ENCOUNTER — Encounter: Payer: Self-pay | Admitting: Vascular Surgery

## 2013-01-29 ENCOUNTER — Ambulatory Visit (INDEPENDENT_AMBULATORY_CARE_PROVIDER_SITE_OTHER): Payer: BC Managed Care – PPO | Admitting: Vascular Surgery

## 2013-01-29 DIAGNOSIS — I6529 Occlusion and stenosis of unspecified carotid artery: Secondary | ICD-10-CM

## 2013-01-29 NOTE — Progress Notes (Signed)
[facility] [place]  Postoperative Visit  History of Present Illness  Ellen Baker is a 62 y.o. female who presents for postoperative follow-up for: R CEA (Date: 01/19/13).  The patient's neck incision is healed.  The patient has had no stroke or TIA symptoms.  For VQI Use Only  PRE-ADM LIVING: Home  AMB STATUS: Ambulatory  History   Social History  . Marital Status: Widowed    Spouse Name: N/A    Number of Children: N/A  . Years of Education: N/A   Occupational History  . Not on file.   Social History Main Topics  . Smoking status: Never Smoker   . Smokeless tobacco: Never Used  . Alcohol Use: No  . Drug Use: No  . Sexual Activity: Not on file   Other Topics Concern  . Not on file   Social History Narrative  . No narrative on file    Current Outpatient Prescriptions on File Prior to Visit  Medication Sig Dispense Refill  . Calcium Carb-Ergocalciferol (CHEWABLE CALCIUM/D PO) Take 1 tablet by mouth daily.      . Cholecalciferol (VITAMIN D PO) Take 1 tablet by mouth daily.      . clopidogrel (PLAVIX) 75 MG tablet Take 1 tablet (75 mg total) by mouth daily with breakfast.  30 tablet  0  . famotidine (PEPCID) 40 MG tablet Take 1 tablet (40 mg total) by mouth daily.  30 tablet  0  . loratadine (CLARITIN) 10 MG tablet Take 10 mg by mouth continuous as needed for allergies.      Marland Kitchen losartan (COZAAR) 50 MG tablet Take 50 mg by mouth daily.      . Menthol, Topical Analgesic, (ICY HOT EX) Apply 1 application topically 2 (two) times daily as needed (knee pain).      . Naproxen Sodium (ALEVE PO) Take by mouth continuous as needed.      . simvastatin (ZOCOR) 10 MG tablet Take 1 tablet (10 mg total) by mouth daily at 6 PM.  30 tablet  0  . UNABLE TO FIND Ms.Goodlow was admitted to [facility] from 7/16-7/18 and will be able to return to work from Monday 7/21  1 %  0  . oxyCODONE (ROXICODONE) 5 MG immediate release tablet Take 1 tablet (5 mg total) by mouth every  6 (six) hours as needed for pain.  30 tablet  0   No current facility-administered medications on file prior to visit.    Physical Examination  Filed Vitals:   01/29/13 1637  BP: 134/64  Pulse: 68  Resp:     R Neck: Incision is healed Neuro: CN 2-12 are intact , Motor strength is 5/5 bilaterally, sensation is grossly intact  Medical Decision Making  Lorice Knotek is a 62 y.o. female who presents s/p R CEA for sx R ICA >80%, L ICA stenosis <50%  The patient's neck incision is healing with no stroke symptoms. I discussed in depth with the patient the nature of atherosclerosis, and emphasized the importance of maximal medical management including strict control of blood pressure, blood glucose, and lipid levels, obtaining regular exercise, anti-platelet use and cessation of smoking.   The patient is currently on an antiplatelet: Plavix. The patient is currently on a statin: Zocor. The patient is aware that without maximal medical management the underlying atherosclerotic disease process will progress, limiting the benefit of any interventions. The patient's surveillance will included routine carotid duplex studies which will be completed in: 6 months,  at which time the patient will be re-evaluated.   I emphasized the importance of routine surveillance of the carotid arteries as recurrence of stenosis is possible, especially with proper management of underlying atherosclerotic disease. The patient agrees to participate in their maximal medical care and routine surveillance.  Thank you for allowing [place] to participate in this patient's care.  Leonides Sake, MD [facility] [place] Office: [phone] Pager: [phone]

## 2013-01-30 ENCOUNTER — Other Ambulatory Visit: Payer: Self-pay | Admitting: *Deleted

## 2013-01-30 ENCOUNTER — Ambulatory Visit: Payer: BC Managed Care – PPO | Admitting: Vascular Surgery

## 2013-01-30 DIAGNOSIS — Z48812 Encounter for surgical aftercare following surgery on the circulatory system: Secondary | ICD-10-CM

## 2013-02-06 ENCOUNTER — Ambulatory Visit: Payer: BC Managed Care – PPO | Admitting: Vascular Surgery

## 2013-02-11 DIAGNOSIS — Z0279 Encounter for issue of other medical certificate: Secondary | ICD-10-CM

## 2013-04-23 ENCOUNTER — Other Ambulatory Visit: Payer: Self-pay

## 2013-05-09 ENCOUNTER — Encounter: Payer: Self-pay | Admitting: Vascular Surgery

## 2013-07-31 ENCOUNTER — Encounter: Payer: Self-pay | Admitting: Family

## 2013-08-03 ENCOUNTER — Ambulatory Visit (INDEPENDENT_AMBULATORY_CARE_PROVIDER_SITE_OTHER): Payer: BC Managed Care – PPO | Admitting: Family

## 2013-08-03 ENCOUNTER — Ambulatory Visit (HOSPITAL_COMMUNITY)
Admission: RE | Admit: 2013-08-03 | Discharge: 2013-08-03 | Disposition: A | Discharge status: Home / Self Care | Payer: BC Managed Care – PPO | Source: Ambulatory Visit | Attending: Family | Admitting: Family

## 2013-08-03 ENCOUNTER — Encounter: Payer: Self-pay | Admitting: Family

## 2013-08-03 ENCOUNTER — Other Ambulatory Visit: Payer: Self-pay | Admitting: Family

## 2013-08-03 VITALS — BP 143/68 | HR 61 | Resp 16 | Ht 64.5 in | Wt 204.0 lb

## 2013-08-03 DIAGNOSIS — I658 Occlusion and stenosis of other precerebral arteries: Secondary | ICD-10-CM | POA: Insufficient documentation

## 2013-08-03 DIAGNOSIS — Z9889 Other specified postprocedural states: Secondary | ICD-10-CM | POA: Insufficient documentation

## 2013-08-03 DIAGNOSIS — Z48812 Encounter for surgical aftercare following surgery on the circulatory system: Secondary | ICD-10-CM | POA: Insufficient documentation

## 2013-08-03 DIAGNOSIS — I6529 Occlusion and stenosis of unspecified carotid artery: Secondary | ICD-10-CM

## 2013-08-03 NOTE — Progress Notes (Signed)
Established Carotid Patient   History of Present Illness  Ellen Baker is a 63 y.o. female patient of Dr. Bridgett Larsson who is s/p R CEA (Date: 01/19/13).   She returns today for routine surveillance. Had a TIA last year, as manifested by transient left side diminished sensation and left hemiparesis, was evaluated at [facility], then had the left CEA. She denies any further stroke activity. She states her blood pressure is usually about 120/70.  The patient denies amaurosis fugax or monocular blindness.  The patient  denies facial drooping.  The patient denies receptive or expressive aphasia.   She denies any residual neurological deficits.  Patient  reports New Medical or Surgical History: cortisone injection in right knee for OA.  Pt Diabetic: No Pt smoker: non-smoker  Pt meds include: Statin : Yes ASA: No Other anticoagulants/antiplatelets: Plavix, taking since TIA diagnosed   Past Medical History  Diagnosis Date  . Hypertension   . Heart murmur   . GERD (gastroesophageal reflux disease)   . Arthritis     right knee  . Stroke 12-31-12    no residual effects    Social History History  Substance Use Topics  . Smoking status: Never Smoker   . Smokeless tobacco: Never Used  . Alcohol Use: No    Family History Family History  Problem Relation Age of Onset  . Stroke Mother   . Deep vein thrombosis Mother   . Heart disease Mother     Amputation-  . Hypertension Mother   . Alzheimer's disease Mother   . Heart failure Father   . Heart disease Father   . Hypertension Father     Surgical History Past Surgical History  Procedure Laterality Date  . Tubal ligation    . Colonoscopy  08-18-12  . Upper gi endoscopy  08-18-12  . Esophageal dilation    . Endarterectomy Right 01/19/2013    Procedure: ENDARTERECTOMY CAROTID with patch angioplasty;  Surgeon: Conrad Sedgwick, MD;  Location: [facility] OR;  Service: Vascular;  Laterality: Right;  . Carotid endarterectomy      RIGHT  01/19/13     Allergies  Allergen Reactions  . Adhesive [Tape] Rash    Current Outpatient Prescriptions  Medication Sig Dispense Refill  . Calcium Carb-Ergocalciferol (CHEWABLE CALCIUM/D PO) Take 1 tablet by mouth daily.      . Cholecalciferol (VITAMIN D PO) Take 1 tablet by mouth daily.      . clopidogrel (PLAVIX) 75 MG tablet Take 1 tablet (75 mg total) by mouth daily with breakfast.  30 tablet  0  . famotidine (PEPCID) 40 MG tablet Take 1 tablet (40 mg total) by mouth daily.  30 tablet  0  . loratadine (CLARITIN) 10 MG tablet Take 10 mg by mouth continuous as needed for allergies.      Marland Kitchen losartan (COZAAR) 50 MG tablet Take 50 mg by mouth daily.      . Menthol, Topical Analgesic, (ICY HOT EX) Apply 1 application topically 2 (two) times daily as needed (knee pain).      . Naproxen Sodium (ALEVE PO) Take by mouth continuous as needed.      Marland Kitchen oxyCODONE (ROXICODONE) 5 MG immediate release tablet Take 1 tablet (5 mg total) by mouth every 6 (six) hours as needed for pain.  30 tablet  0  . simvastatin (ZOCOR) 10 MG tablet Take 1 tablet (10 mg total) by mouth daily at 6 PM.  30 tablet  0  . UNABLE TO FIND Ms.Devito was admitted  to [facility] from 7/16-7/18 and will be able to return to work from Monday 7/21  1 %  0   No current facility-administered medications for this visit.    Review of Systems : See HPI for pertinent positives and negatives.  Physical Examination  Filed Vitals:   08/03/13 1246  BP: 143/68  Pulse: 61  Resp: 16   Filed Weights   08/03/13 1246  Weight: 204 lb (92.534 kg)   Body mass index is 34.49 kg/(m^2).  General: WDWN female in NAD GAIT: normal Eyes: PERRLA Pulmonary:  Non-labored, CTAB, Negative  Rales, Negative rhonchi, & Negative wheezing.  Cardiac: regular Rhythm ,  Negative detected murmur.  VASCULAR EXAM Carotid Bruits Left Right   Negative Negative   Radial pulses are 2+ palpable and equal.                                                                                                                             LE Pulses LEFT RIGHT       POPLITEAL  not palpable   not palpable       POSTERIOR TIBIAL   palpable    palpable        DORSALIS PEDIS      ANTERIOR TIBIAL  palpable   palpable     Gastrointestinal: soft, nontender, BS WNL, no r/g,  negative masses.  Musculoskeletal: Negative muscle atrophy/wasting. M/S 5/5 throughout, Extremities without ischemic changes.  Neurologic: A&O X 3; Appropriate Affect ; SENSATION ;normal;  Speech is normal CN 2-12 intact, Pain and light touch intact in extremities, Motor exam as listed above.   Non-Invasive Vascular Imaging CAROTID DUPLEX 08/03/2013   CEREBROVASCULAR DUPLEX EVALUATION    INDICATION: Follow-up carotid disease     PREVIOUS INTERVENTION(S): Right carotid endarterectomy 01/19/2013    DUPLEX EXAM:     RIGHT  LEFT  Peak Systolic Velocities (cm/s) End Diastolic Velocities (cm/s) Plaque LOCATION Peak Systolic Velocities (cm/s) End Diastolic Velocities (cm/s) Plaque  126 28  CCA PROXIMAL 94 22   87 21  CCA MID 93 29   99 30  CCA DISTAL 87 25   172 25  ECA 118 17   119 23  ICA PROXIMAL 94 31   82 25  ICA MID 111 39   109 39  ICA DISTAL 104 33     NA ICA / CCA Ratio (PSV) 1.1  Antegrade  Vertebral Flow Antegrade   242 Brachial Systolic Pressure (mmHg) 683  Within normal limits  Brachial Artery Waveforms Within normal limits     Plaque Morphology:  HM = Homogeneous, HT = Heterogeneous, CP = Calcific Plaque, SP = Smooth Plaque, IP = Irregular Plaque     ADDITIONAL FINDINGS:     IMPRESSION: 1. Patent right carotid endarterectomy with evidence mild (<40%) plaque at the proximal patch site. 2. Evidence of <40% stenosis of the left internal carotid artery. 3. Bilateral vertebral artery is antegrade.    Compared  to the previous exam:  No previous exam at this facility for comparison.      Assessment: Ellen Baker is a 63 y.o. female who presents with asymptomatic minimal  bilateral ICA stenosis. The right ICA stenosis is  Improved from about 90% stenosis since the right CEA.  Since she has evidence of mild plaque at the proximal patch site, and has a history of TIA, consider intensive statin therapy which is associated with a greater reduction in CVD risk and improved endothelial function , will defer to PCP.  Plan: Follow-up in 6 months with Carotid Duplex scan.   I discussed in depth with the patient the nature of atherosclerosis, and emphasized the importance of maximal medical management including strict control of blood pressure, blood glucose, and lipid levels, obtaining regular exercise, and continued cessation of smoking.  The patient is aware that without maximal medical management the underlying atherosclerotic disease process will progress, limiting the benefit of any interventions. The patient was given information about stroke prevention and what symptoms should prompt the patient to seek immediate medical care. Thank you for allowing [place] to participate in this patient's care.  Clemon Chambers, RN, MSN, FNP-C [facility] [place] Office: [facility] Physician: Trula Slade  08/03/2013 10:01 AM

## 2013-08-03 NOTE — Patient Instructions (Signed)

## 2014-02-01 ENCOUNTER — Ambulatory Visit: Payer: BC Managed Care – PPO | Admitting: Family

## 2014-02-01 ENCOUNTER — Other Ambulatory Visit (HOSPITAL_COMMUNITY): Payer: BC Managed Care – PPO

## 2014-02-11 ENCOUNTER — Encounter: Payer: Self-pay | Admitting: Family

## 2014-02-12 ENCOUNTER — Ambulatory Visit (INDEPENDENT_AMBULATORY_CARE_PROVIDER_SITE_OTHER): Payer: 59 | Admitting: Family

## 2014-02-12 ENCOUNTER — Encounter: Payer: Self-pay | Admitting: Family

## 2014-02-12 ENCOUNTER — Ambulatory Visit (HOSPITAL_COMMUNITY)
Admission: RE | Admit: 2014-02-12 | Discharge: 2014-02-12 | Disposition: A | Discharge status: Home / Self Care | Payer: 59 | Source: Ambulatory Visit | Attending: Family | Admitting: Family

## 2014-02-12 VITALS — BP 140/82 | HR 67 | Resp 16 | Ht 65.0 in | Wt 200.0 lb

## 2014-02-12 DIAGNOSIS — Z48812 Encounter for surgical aftercare following surgery on the circulatory system: Secondary | ICD-10-CM | POA: Insufficient documentation

## 2014-02-12 DIAGNOSIS — I6529 Occlusion and stenosis of unspecified carotid artery: Secondary | ICD-10-CM | POA: Insufficient documentation

## 2014-02-12 NOTE — Patient Instructions (Signed)
Stroke Prevention Some medical conditions and behaviors are associated with an increased chance of having a stroke. You may prevent a stroke by making healthy choices and managing medical conditions. HOW CAN I REDUCE MY RISK OF HAVING A STROKE?   Stay physically active. Get at least 30 minutes of activity on most or all days.  Do not smoke. It may also be helpful to avoid exposure to secondhand smoke.  Limit alcohol use. Moderate alcohol use is considered to be:  No more than 2 drinks per day for men.  No more than 1 drink per day for nonpregnant women.  Eat healthy foods. This involves:  Eating 5 or more servings of fruits and vegetables a day.  Making dietary changes that address high blood pressure (hypertension), high cholesterol, diabetes, or obesity.  Manage your cholesterol levels.  Making food choices that are high in fiber and low in saturated fat, trans fat, and cholesterol may control cholesterol levels.  Take any prescribed medicines to control cholesterol as directed by your health care provider.  Manage your diabetes.  Controlling your carbohydrate and sugar intake is recommended to manage diabetes.  Take any prescribed medicines to control diabetes as directed by your health care provider.  Control your hypertension.  Making food choices that are low in salt (sodium), saturated fat, trans fat, and cholesterol is recommended to manage hypertension.  Take any prescribed medicines to control hypertension as directed by your health care provider.  Maintain a healthy weight.  Reducing calorie intake and making food choices that are low in sodium, saturated fat, trans fat, and cholesterol are recommended to manage weight.  Stop drug abuse.  Avoid taking birth control pills.  Talk to your health care provider about the risks of taking birth control pills if you are over 35 years old, smoke, get migraines, or have ever had a blood clot.  Get evaluated for sleep  disorders (sleep apnea).  Talk to your health care provider about getting a sleep evaluation if you snore a lot or have excessive sleepiness.  Take medicines only as directed by your health care provider.  For some people, aspirin or blood thinners (anticoagulants) are helpful in reducing the risk of forming abnormal blood clots that can lead to stroke. If you have the irregular heart rhythm of atrial fibrillation, you should be on a blood thinner unless there is a good reason you cannot take them.  Understand all your medicine instructions.  Make sure that other conditions (such as anemia or atherosclerosis) are addressed. SEEK IMMEDIATE MEDICAL CARE IF:   You have sudden weakness or numbness of the face, arm, or leg, especially on one side of the body.  Your face or eyelid droops to one side.  You have sudden confusion.  You have trouble speaking (aphasia) or understanding.  You have sudden trouble seeing in one or both eyes.  You have sudden trouble walking.  You have dizziness.  You have a loss of balance or coordination.  You have a sudden, severe headache with no known cause.  You have new chest pain or an irregular heartbeat. Any of these symptoms may represent a serious problem that is an emergency. Do not wait to see if the symptoms will go away. Get medical help at once. Call your local emergency services (911 in [place] Do not drive yourself to the hospital. Document Released: 07/12/2004 Document Revised: 10/19/2013 Document Reviewed: 12/05/2012 Exit[employer] Patient Information 2015 [employer]. This information is not intended to replace advice given   to you by your health care provider. Make sure you discuss any questions you have with your health care provider.  

## 2014-02-12 NOTE — Progress Notes (Signed)
Established Carotid Patient   History of Present Illness  Ellen Baker is a 63 y.o. female patient of Dr. Bridgett Larsson who is s/p R CEA (Date: 01/19/13).  She returns today for routine surveillance.  She had a TIA July, 2014, as manifested by transient left side diminished sensation and left hemiparesis, was evaluated at [facility], then had the left CEA.  She denies any further stroke activity.  She states her blood pressure is usually about 120/70.  The patient denies amaurosis fugax or monocular blindness. The patient denies facial drooping.  The patient denies receptive or expressive aphasia.  She denies any residual neurological deficits.  Patient denies New Medical or Surgical History. She denies claudication symptoms with walking.  Pt Diabetic: No  Pt smoker: non-smoker   Pt meds include:  Statin : Yes  ASA: No  Other anticoagulants/antiplatelets: Plavix, taking since TIA diagnosed   Past Medical History  Diagnosis Date  . Hypertension   . Heart murmur   . GERD (gastroesophageal reflux disease)   . Arthritis     right knee  . Stroke 12-31-12    no residual effects  . Atrial fibrillation   . Carotid artery occlusion     Social History History  Substance Use Topics  . Smoking status: Never Smoker   . Smokeless tobacco: Never Used  . Alcohol Use: No    Family History Family History  Problem Relation Age of Onset  . Stroke Mother   . Deep vein thrombosis Mother   . Heart disease Mother     Amputation-  . Hypertension Mother   . Alzheimer's disease Mother   . Heart failure Father   . Heart disease Father     CF  . Hypertension Father   . Varicose Veins Father   . Parkinson's disease Father     Surgical History Past Surgical History  Procedure Laterality Date  . Tubal ligation    . Colonoscopy  08-18-12  . Upper gi endoscopy  08-18-12  . Esophageal dilation    . Endarterectomy Right 01/19/2013    Procedure: ENDARTERECTOMY CAROTID with patch angioplasty;  Surgeon:  Conrad Sparta, MD;  Location: [facility] OR;  Service: Vascular;  Laterality: Right;  . Carotid endarterectomy      RIGHT  01/19/13    Allergies  Allergen Reactions  . Adhesive [Tape] Rash    Skin became RAW    Current Outpatient Prescriptions  Medication Sig Dispense Refill  . Calcium Carb-Ergocalciferol (CHEWABLE CALCIUM/D PO) Take 1 tablet by mouth daily.      . Cholecalciferol (VITAMIN D PO) Take 1 tablet by mouth daily.      . clopidogrel (PLAVIX) 75 MG tablet Take 1 tablet (75 mg total) by mouth daily with breakfast.  30 tablet  0  . loratadine (CLARITIN) 10 MG tablet Take 10 mg by mouth continuous as needed for allergies.      Marland Kitchen losartan (COZAAR) 50 MG tablet Take 50 mg by mouth daily.      . Menthol, Topical Analgesic, (ICY HOT EX) Apply 1 application topically 2 (two) times daily as needed (knee pain).      . Naproxen Sodium (ALEVE PO) Take by mouth continuous as needed.      . simvastatin (ZOCOR) 10 MG tablet Take 20 mg by mouth daily at 6 PM.      . UNABLE TO FIND Ms.Schutter was admitted to [facility] from 7/16-7/18 and will be able to return to work from Monday 7/21  1 %  0  . famotidine (PEPCID) 40 MG tablet Take 1 tablet (40 mg total) by mouth daily.  30 tablet  0  . oxyCODONE (ROXICODONE) 5 MG immediate release tablet Take 1 tablet (5 mg total) by mouth every 6 (six) hours as needed for pain.  30 tablet  0   No current facility-administered medications for this visit.    Review of Systems : See HPI for pertinent positives and negatives.  Physical Examination  Filed Vitals:   02/12/14 1533 02/12/14 1539  BP: 157/76 140/82  Pulse: 63 67  Resp:  16  Height:  5\' 5"  (1.651 m)  Weight:  200 lb (90.719 kg)  SpO2:  97%   Body mass index is 33.28 kg/(m^2).  General: WDWN obese female in NAD  GAIT: normal  Eyes: PERRLA  Pulmonary: Non-labored, CTAB, Negative Rales, Negative rhonchi, & Negative wheezing.  Cardiac: regular Rhythm , positive murmur.   VASCULAR EXAM  Carotid  Bruits  Left  Right    Negative  Negative   Radial pulses are 2+ palpable and equal.   LE Pulses  LEFT  RIGHT   POPLITEAL  not palpable  not palpable   POSTERIOR TIBIAL  palpable  palpable   DORSALIS PEDIS  ANTERIOR TIBIAL  palpable  palpable    Gastrointestinal: soft, nontender, BS WNL, no r/g, negative masses.  Musculoskeletal: Negative muscle atrophy/wasting. M/S 5/5 throughout, Extremities without ischemic changes.  Neurologic: A&O X 3; Appropriate Affect ; SENSATION ;normal;  Speech is normal  CN 2-12 intact, Pain and light touch intact in extremities, Motor exam as listed above.   Non-Invasive Vascular Imaging CAROTID DUPLEX 02/12/2014   CEREBROVASCULAR DUPLEX EVALUATION    INDICATION: Follow-up carotid disease     PREVIOUS INTERVENTION(S): Right carotid endarterectomy 01/19/2013    DUPLEX EXAM:     RIGHT  LEFT  Peak Systolic Velocities (cm/s) End Diastolic Velocities (cm/s) Plaque LOCATION Peak Systolic Velocities (cm/s) End Diastolic Velocities (cm/s) Plaque  95 24  CCA PROXIMAL 97 19   79 21  CCA MID 83 28   81 24  CCA DISTAL 79 27   122 19  ECA 87 12   90 26  ICA PROXIMAL 106 41 HT  80 31  ICA MID 129 47   108 31  ICA DISTAL 117 43     NA ICA / CCA Ratio (PSV) 1.6  Antegrade  Vertebral Flow Antegrade    Brachial Systolic Pressure (mmHg)   Within normal limits  Brachial Artery Waveforms Within normal limits     Plaque Morphology:  HM = Homogeneous, HT = Heterogeneous, CP = Calcific Plaque, SP = Smooth Plaque, IP = Irregular Plaque     ADDITIONAL FINDINGS:     IMPRESSION: 1. Patent right carotid endarterectomy with evidence of minimal (<40%) plaque at the proximal patch. 2. Velocities suggest 40%-59% left internal carotid artery stenosis; however, this appears to be primarily due to tortuosity as no significant plaque is observed. 3. Bilateral vertebral artery is within normal limits.    Compared to the previous exam:  Velocity increase of left internal  carotid artery without significant plaque formation.      Assessment: Ellen Baker is a 63 y.o. female who presents with asymptomatic patent right carotid endarterectomy with evidence of minimal (<40%) plaque at the proximal patch;  40%-59% left internal carotid artery stenosis; however, this appears to be primarily due to tortuosity as no significant plaque is observed. Velocity increase of left internal carotid artery without significant plaque  formation.  Plan: Follow-up in 1 year with Carotid Duplex scan.   I discussed in depth with the patient the nature of atherosclerosis, and emphasized the importance of maximal medical management including strict control of blood pressure, blood glucose, and lipid levels, obtaining regular exercise, and continued cessation of smoking.  The patient is aware that without maximal medical management the underlying atherosclerotic disease process will progress, limiting the benefit of any interventions. The patient was given information about stroke prevention and what symptoms should prompt the patient to seek immediate medical care. Thank you for allowing [place] to participate in this patient's care.  Clemon Chambers, RN, MSN, FNP-C [facility] [place] Office: [phone]  Clinic Physician: Bridgett Larsson  02/12/2014 3:48 PM

## 2014-10-15 ENCOUNTER — Encounter: Payer: Self-pay | Admitting: Cardiology

## 2014-12-03 ENCOUNTER — Encounter: Payer: Self-pay | Admitting: Family Medicine

## 2014-12-03 ENCOUNTER — Ambulatory Visit (INDEPENDENT_AMBULATORY_CARE_PROVIDER_SITE_OTHER): Payer: 59 | Admitting: Family Medicine

## 2014-12-03 VITALS — BP 120/80 | HR 72 | Ht 65.0 in | Wt 180.0 lb

## 2014-12-03 DIAGNOSIS — E785 Hyperlipidemia, unspecified: Secondary | ICD-10-CM | POA: Diagnosis not present

## 2014-12-03 DIAGNOSIS — I10 Essential (primary) hypertension: Secondary | ICD-10-CM

## 2014-12-03 DIAGNOSIS — I6523 Occlusion and stenosis of bilateral carotid arteries: Secondary | ICD-10-CM | POA: Diagnosis not present

## 2014-12-03 MED ORDER — CLOPIDOGREL BISULFATE 75 MG PO TABS: 75.0000 mg | ORAL_TABLET | Freq: Every day | ORAL | Status: DC

## 2014-12-03 MED ORDER — LOSARTAN POTASSIUM 50 MG PO TABS: 50.0000 mg | ORAL_TABLET | Freq: Every day | ORAL | Status: DC

## 2014-12-03 MED ORDER — SIMVASTATIN 10 MG PO TABS: 20.0000 mg | ORAL_TABLET | Freq: Every day | ORAL | Status: DC

## 2014-12-03 NOTE — Progress Notes (Signed)
Name: Ellen Baker   MRN: [id number]    DOB: May 26, 1951   Date:12/03/2014       Progress Note  Subjective  Chief Complaint  Chief Complaint  Patient presents with  . Hypertension  . Hyperlipidemia  . Coronary Artery Disease    takes Plavix for stroke    Hypertension This is a recurrent problem. The current episode started more than 1 year ago. The problem has been gradually improving since onset. Pertinent negatives include no anxiety, blurred vision, chest pain, headaches, malaise/fatigue, neck pain, orthopnea, palpitations, peripheral edema, PND, shortness of breath or sweats. There are no associated agents to hypertension. There are no known risk factors for coronary artery disease. Past treatments include angiotensin blockers. The current treatment provides no improvement. There are no compliance problems.  Hypertensive end-organ damage includes CVA. There is no history of kidney disease, CAD/MI, heart failure, left ventricular hypertrophy, PVD or retinopathy. There is no history of chronic renal disease.  Hyperlipidemia This is a recurrent problem. The current episode started more than 1 year ago. The problem is uncontrolled. Recent lipid tests were reviewed and are normal. She has no history of chronic renal disease. Pertinent negatives include no chest pain or shortness of breath. She is currently on no antihyperlipidemic treatment. The current treatment provides no improvement of lipids. There are no compliance problems.  There are no known risk factors for coronary artery disease.  Coronary Artery Disease Presents for follow-up visit. Pertinent negatives include no chest pain, chest pressure, chest tightness, dizziness, palpitations or shortness of breath. Risk factors include hyperlipidemia and hypertension. Past treatments include statins, antiplatelet agents and ACE inhibitors. The treatment provided moderate relief. Compliance with prior treatments has been good.    No  problem-specific assessment & plan notes found for this encounter.   Past Medical History  Diagnosis Date  . Hypertension   . Heart murmur   . GERD (gastroesophageal reflux disease)   . Arthritis     right knee  . Stroke 12-31-12    no residual effects  . Carotid artery occlusion     Past Surgical History  Procedure Laterality Date  . Tubal ligation    . Colonoscopy  08-18-12  . Upper gi endoscopy  08-18-12  . Esophageal dilation    . Endarterectomy Right 01/19/2013    Procedure: ENDARTERECTOMY CAROTID with patch angioplasty;  Surgeon: Conrad , MD;  Location: [facility] OR;  Service: Vascular;  Laterality: Right;  . Carotid endarterectomy      RIGHT  01/19/13    Family History  Problem Relation Age of Onset  . Stroke Mother   . Deep vein thrombosis Mother   . Heart disease Mother     Amputation-  . Hypertension Mother   . Alzheimer's disease Mother   . Heart failure Father   . Heart disease Father     CF  . Hypertension Father   . Varicose Veins Father   . Parkinson's disease Father     History   Social History  . Marital Status: Widowed    Spouse Name: N/A  . Number of Children: N/A  . Years of Education: N/A   Occupational History  . Not on file.   Social History Main Topics  . Smoking status: Never Smoker   . Smokeless tobacco: Never Used  . Alcohol Use: No  . Drug Use: No  . Sexual Activity: No   Other Topics Concern  . Not on file   Social History Narrative  Allergies  Allergen Reactions  . Adhesive [Tape] Rash    Skin became RAW     Review of Systems  Constitutional: Negative for fever, chills and malaise/fatigue.  HENT: Negative for congestion.   Eyes: Negative for blurred vision.  Respiratory: Negative for chest tightness and shortness of breath.   Cardiovascular: Negative for chest pain, palpitations, orthopnea and PND.  Gastrointestinal: Negative for heartburn, nausea, diarrhea and constipation.  Genitourinary: Negative for dysuria,  urgency, frequency and hematuria.  Musculoskeletal: Negative for falls and neck pain.  Skin: Negative for rash.  Neurological: Negative for dizziness, sensory change, loss of consciousness and headaches.  Endo/Heme/Allergies: Negative for environmental allergies. Does not bruise/bleed easily.  Psychiatric/Behavioral: Negative for depression and suicidal ideas. The patient is not nervous/anxious.      Objective  Filed Vitals:   12/03/14 1342  BP: 120/80  Pulse: 72  Height: 5\' 5"  (1.651 m)  Weight: 180 lb (81.647 kg)    Physical Exam  Constitutional: She is well-developed, well-nourished, and in no distress. No distress.  HENT:  Head: Normocephalic and atraumatic.  Right Ear: External ear normal.  Left Ear: External ear normal.  Nose: Nose normal.  Mouth/Throat: Oropharynx is clear and moist.  Eyes: Conjunctivae and EOM are normal. Pupils are equal, round, and reactive to light. Right eye exhibits no discharge. Left eye exhibits no discharge.  Neck: Normal range of motion. Neck supple. No JVD present. No thyromegaly present.  Cardiovascular: Normal rate, regular rhythm, normal heart sounds and intact distal pulses.  Exam reveals no gallop and no friction rub.   No murmur heard. Pulmonary/Chest: Effort normal and breath sounds normal.  Abdominal: Soft. Bowel sounds are normal. She exhibits no mass. There is no tenderness. There is no guarding.  Musculoskeletal: Normal range of motion. She exhibits no edema.  Lymphadenopathy:    She has no cervical adenopathy.  Neurological: She is alert. She has normal reflexes.  Skin: Skin is warm and dry. She is not diaphoretic.  Psychiatric: Mood and affect normal.      Assessment & Plan  Problem List Items Addressed This Visit      Cardiovascular and Mediastinum   HTN (hypertension)   Relevant Medications   losartan (COZAAR) 50 MG tablet   simvastatin (ZOCOR) 10 MG tablet   Other Relevant Orders   Renal Function Panel   Carotid  artery stenosis, symptomatic - Primary   Relevant Medications   clopidogrel (PLAVIX) 75 MG tablet   losartan (COZAAR) 50 MG tablet   simvastatin (ZOCOR) 10 MG tablet    Other Visit Diagnoses    Hyperlipidemia        Relevant Medications    losartan (COZAAR) 50 MG tablet    simvastatin (ZOCOR) 10 MG tablet    Other Relevant Orders    Lipid Profile         Dr. Otilio Miu [facility]  12/03/2014

## 2014-12-04 LAB — RENAL FUNCTION PANEL
Albumin: 4.2 g/dL (ref 3.6–4.8)
BUN/Creatinine Ratio: 10 — ABNORMAL LOW (ref 11–26)
BUN: 7 mg/dL — ABNORMAL LOW (ref 8–27)
CALCIUM: 9.5 mg/dL (ref 8.7–10.3)
CHLORIDE: 99 mmol/L (ref 97–108)
CO2: 27 mmol/L (ref 18–29)
CREATININE: 0.7 mg/dL (ref 0.57–1.00)
GFR, EST AFRICAN AMERICAN: 107 mL/min/{1.73_m2} (ref >59)
GFR, EST NON AFRICAN AMERICAN: 93 mL/min/{1.73_m2} (ref >59)
Glucose: 93 mg/dL (ref 65–99)
PHOSPHORUS: 3.9 mg/dL (ref 2.5–4.5)
POTASSIUM: 4 mmol/L (ref 3.5–5.2)
Sodium: 140 mmol/L (ref 134–144)

## 2014-12-04 LAB — LIPID PANEL
CHOLESTEROL TOTAL: 157 mg/dL (ref 100–199)
Chol/HDL Ratio: 2.8 ratio units (ref 0.0–4.4)
HDL: 57 mg/dL (ref >39)
LDL CALC: 79 mg/dL (ref 0–99)
TRIGLYCERIDES: 107 mg/dL (ref 0–149)
VLDL Cholesterol Cal: 21 mg/dL (ref 5–40)

## 2015-01-06 ENCOUNTER — Other Ambulatory Visit: Payer: Self-pay

## 2015-01-06 DIAGNOSIS — I6523 Occlusion and stenosis of bilateral carotid arteries: Secondary | ICD-10-CM

## 2015-01-06 DIAGNOSIS — I10 Essential (primary) hypertension: Secondary | ICD-10-CM

## 2015-01-06 MED ORDER — CLOPIDOGREL BISULFATE 75 MG PO TABS: 75.0000 mg | ORAL_TABLET | Freq: Every day | ORAL | Status: DC

## 2015-01-06 MED ORDER — LOSARTAN POTASSIUM 50 MG PO TABS: 50.0000 mg | ORAL_TABLET | Freq: Every day | ORAL | Status: DC

## 2015-01-26 ENCOUNTER — Other Ambulatory Visit: Payer: Self-pay | Admitting: Family Medicine

## 2015-01-26 DIAGNOSIS — E785 Hyperlipidemia, unspecified: Secondary | ICD-10-CM

## 2015-02-16 ENCOUNTER — Other Ambulatory Visit: Payer: Self-pay | Admitting: Gastroenterology

## 2015-02-16 DIAGNOSIS — R1310 Dysphagia, unspecified: Secondary | ICD-10-CM

## 2015-02-17 ENCOUNTER — Encounter: Payer: Self-pay | Admitting: Family

## 2015-02-18 ENCOUNTER — Encounter: Payer: Self-pay | Admitting: Family

## 2015-02-18 ENCOUNTER — Ambulatory Visit (INDEPENDENT_AMBULATORY_CARE_PROVIDER_SITE_OTHER): Payer: 59 | Admitting: Family

## 2015-02-18 ENCOUNTER — Ambulatory Visit (HOSPITAL_COMMUNITY)
Admission: RE | Admit: 2015-02-18 | Discharge: 2015-02-18 | Disposition: A | Discharge status: Home / Self Care | Payer: 59 | Source: Ambulatory Visit | Attending: Family | Admitting: Family

## 2015-02-18 VITALS — BP 140/73 | HR 68 | Temp 97.9°F | Resp 14 | Ht 64.0 in | Wt 173.0 lb

## 2015-02-18 DIAGNOSIS — I10 Essential (primary) hypertension: Secondary | ICD-10-CM | POA: Diagnosis not present

## 2015-02-18 DIAGNOSIS — Z48812 Encounter for surgical aftercare following surgery on the circulatory system: Secondary | ICD-10-CM | POA: Diagnosis not present

## 2015-02-18 DIAGNOSIS — I6523 Occlusion and stenosis of bilateral carotid arteries: Secondary | ICD-10-CM

## 2015-02-18 DIAGNOSIS — Z9889 Other specified postprocedural states: Secondary | ICD-10-CM | POA: Diagnosis not present

## 2015-02-18 NOTE — Progress Notes (Signed)
Established Carotid Patient   History of Present Illness  Ellen Baker is a 64 y.o. female patient of Dr. Bridgett Larsson who is s/p R CEA (Date: 01/19/13).  She returns today for routine surveillance.  She had a TIA July, 2014, as manifested by transient left side diminished sensation and left hemiparesis, was evaluated at [facility], then had the left CEA.  She denies any further stroke or TIA activity.  She states her blood pressure is usually about 120/70.  The patient denies amaurosis fugax or monocular blindness. The patient denies unilateral facial drooping.  The patient denies receptive or expressive aphasia.  She denies any residual neurological deficits.  Patient denies New Medical or Surgical History. She denies claudication symptoms with walking.  Pt Diabetic: No  Pt smoker: non-smoker   Pt meds include:  Statin : Yes  ASA: No  Other anticoagulants/antiplatelets: Plavix, taking since TIA diagnosed    Past Medical History  Diagnosis Date  . Hypertension   . Heart murmur   . GERD (gastroesophageal reflux disease)   . Arthritis     right knee  . Stroke 12-31-12    no residual effects  . Carotid artery occlusion     Social History Social History  Substance Use Topics  . Smoking status: Never Smoker   . Smokeless tobacco: Never Used  . Alcohol Use: No    Family History Family History  Problem Relation Age of Onset  . Stroke Mother   . Deep vein thrombosis Mother   . Heart disease Mother     Amputation-  . Hypertension Mother   . Alzheimer's disease Mother   . Heart failure Father   . Heart disease Father     CF  . Hypertension Father   . Varicose Veins Father   . Parkinson's disease Father     Surgical History Past Surgical History  Procedure Laterality Date  . Tubal ligation    . Colonoscopy  08-18-12  . Upper gi endoscopy  08-18-12  . Esophageal dilation    . Endarterectomy Right 01/19/2013    Procedure: ENDARTERECTOMY CAROTID with patch angioplasty;   Surgeon: Conrad [place], MD;  Location: [facility] OR;  Service: Vascular;  Laterality: Right;  . Carotid endarterectomy      RIGHT  01/19/13    Allergies  Allergen Reactions  . Adhesive [Tape] Rash    Skin became RAW    Current Outpatient Prescriptions  Medication Sig Dispense Refill  . Calcium Carb-Ergocalciferol (CHEWABLE CALCIUM/D PO) Take 1 tablet by mouth daily.    . Cholecalciferol (VITAMIN D PO) Take 1 tablet by mouth daily.    . clopidogrel (PLAVIX) 75 MG tablet Take 1 tablet (75 mg total) by mouth daily with breakfast. 30 tablet 2  . losartan (COZAAR) 50 MG tablet Take 1 tablet (50 mg total) by mouth daily. 30 tablet 3  . Menthol, Topical Analgesic, (ICY HOT EX) Apply 1 application topically 2 (two) times daily as needed (knee pain).    . Naproxen Sodium (ALEVE PO) Take by mouth continuous as needed.    . Omeprazole (PRILOSEC PO) Take by mouth. Patient is not sure of doseage    . simvastatin (ZOCOR) 20 MG tablet TAKE 1 TABLET BY MOUTH EVERY DAY 30 tablet 0  . loratadine (CLARITIN) 10 MG tablet Take 10 mg by mouth continuous as needed for allergies.    . simvastatin (ZOCOR) 10 MG tablet Take 2 tablets (20 mg total) by mouth daily at 6 PM. (Patient not taking: Reported  on 02/18/2015) 30 tablet 6   No current facility-administered medications for this visit.    Review of Systems : See HPI for pertinent positives and negatives.  Physical Examination  Filed Vitals:   02/18/15 1048 02/18/15 1050  BP: 134/66 140/73  Pulse: 68 68  Temp: 97.9 F (36.6 C)   Resp: 14   Height: 5\' 4"  (1.626 m)   Weight: 173 lb (78.472 kg)    Body mass index is 29.68 kg/(m^2).  General: WDWN obese female in NAD  GAIT: normal  Eyes: PERRLA  Pulmonary: Non-labored, CTAB, Negative Rales, Negative rhonchi, & Negative wheezing.  Cardiac: regular Rhythm, + murmur.   VASCULAR EXAM  Carotid Bruits  Left  Right    Negative  Negative   Radial pulses are 2+ palpable and equal.   LE Pulses   LEFT  RIGHT   POPLITEAL  not palpable  not palpable   POSTERIOR TIBIAL  palpable  palpable   DORSALIS PEDIS  ANTERIOR TIBIAL  palpable  palpable    Gastrointestinal: soft, nontender, BS WNL, no r/g, no palpable masses.  Musculoskeletal: Negative muscle atrophy/wasting. M/S 5/5 throughout, Extremities without ischemic changes.  Neurologic: A&O X 3; Appropriate Affect, Speech is normal  CN 2-12 intact, Pain and light touch intact in extremities, Motor exam as listed above.          Non-Invasive Vascular Imaging CAROTID DUPLEX 02/18/2015   CEREBROVASCULAR DUPLEX EVALUATION    INDICATION: Carotid artery disease     PREVIOUS INTERVENTION(S): Right carotid endarterectomy 01/19/2013    DUPLEX EXAM:     RIGHT  LEFT  Peak Systolic Velocities (cm/s) End Diastolic Velocities (cm/s) Plaque LOCATION Peak Systolic Velocities (cm/s) End Diastolic Velocities (cm/s) Plaque  127 34  CCA PROXIMAL 104 20   114 25  CCA MID 76 24   109 30  CCA DISTAL 66 23   81 13  ECA 122 17   150 32  ICA PROXIMAL 127 33 HT  104 34  ICA MID 89 36   107 37  ICA DISTAL 85 31     NA ICA / CCA Ratio (PSV) 1.67  Antegrade  Vertebral Flow Antegrade    Brachial Systolic Pressure (mmHg)   Multiphasic (Subclavian artery) Brachial Artery Waveforms Multiphasic (Subclavian artery)    Plaque Morphology:  HM = Homogeneous, HT = Heterogeneous, CP = Calcific Plaque, SP = Smooth Plaque, IP = Irregular Plaque  ADDITIONAL FINDINGS:     IMPRESSION: Patent right carotid endarterectomy site with no evidence of hyperplasia or restenosis.  Left internal carotid artery velocities suggest a <40% stenosis.     Compared to the previous exam:  No significant change in comparison to the last exam on 02/12/2014.      Assessment: Ellen Baker is a 64 y.o. female who is s/p R CEA (Date: 01/19/13).  She had a TIA July, 2014, as manifested by transient left side diminished sensation and left hemiparesis, was evaluated  at [facility], then had the left CEA.  She denies any further stroke or TIA activity.  Today's carotid Duplex suggests a patent right carotid endarterectomy site with no evidence of hyperplasia or restenosis and <40% left ICA stenosis. No significant change in comparison to the last exam on 02/12/2014.  She takes Plavix and a statin.   Plan: Follow-up in 1 year with Carotid Duplex.   I discussed in depth with the patient the nature of atherosclerosis, and emphasized the importance of maximal medical management including strict control of blood  pressure, blood glucose, and lipid levels, obtaining regular exercise, and continued cessation of smoking.  The patient is aware that without maximal medical management the underlying atherosclerotic disease process will progress, limiting the benefit of any interventions. The patient was given information about stroke prevention and what symptoms should prompt the patient to seek immediate medical care. Thank you for allowing [place] to participate in this patient's care.  Clemon Chambers, RN, MSN, FNP-C [facility] Office: [phone]  Clinic Physician: Scot Dock on call  02/18/2015 11:12 AM

## 2015-02-18 NOTE — Patient Instructions (Signed)
Stroke Prevention Some medical conditions and behaviors are associated with an increased chance of having a stroke. You may prevent a stroke by making healthy choices and managing medical conditions. HOW CAN I REDUCE MY RISK OF HAVING A STROKE?   Stay physically active. Get at least 30 minutes of activity on most or all days.  Do not smoke. It may also be helpful to avoid exposure to secondhand smoke.  Limit alcohol use. Moderate alcohol use is considered to be:  No more than 2 drinks per day for men.  No more than 1 drink per day for nonpregnant women.  Eat healthy foods. This involves:  Eating 5 or more servings of fruits and vegetables a day.  Making dietary changes that address high blood pressure (hypertension), high cholesterol, diabetes, or obesity.  Manage your cholesterol levels.  Making food choices that are high in fiber and low in saturated fat, trans fat, and cholesterol may control cholesterol levels.  Take any prescribed medicines to control cholesterol as directed by your health care provider.  Manage your diabetes.  Controlling your carbohydrate and sugar intake is recommended to manage diabetes.  Take any prescribed medicines to control diabetes as directed by your health care provider.  Control your hypertension.  Making food choices that are low in salt (sodium), saturated fat, trans fat, and cholesterol is recommended to manage hypertension.  Take any prescribed medicines to control hypertension as directed by your health care provider.  Maintain a healthy weight.  Reducing calorie intake and making food choices that are low in sodium, saturated fat, trans fat, and cholesterol are recommended to manage weight.  Stop drug abuse.  Avoid taking birth control pills.  Talk to your health care provider about the risks of taking birth control pills if you are over 35 years old, smoke, get migraines, or have ever had a blood clot.  Get evaluated for sleep  disorders (sleep apnea).  Talk to your health care provider about getting a sleep evaluation if you snore a lot or have excessive sleepiness.  Take medicines only as directed by your health care provider.  For some people, aspirin or blood thinners (anticoagulants) are helpful in reducing the risk of forming abnormal blood clots that can lead to stroke. If you have the irregular heart rhythm of atrial fibrillation, you should be on a blood thinner unless there is a good reason you cannot take them.  Understand all your medicine instructions.  Make sure that other conditions (such as anemia or atherosclerosis) are addressed. SEEK IMMEDIATE MEDICAL CARE IF:   You have sudden weakness or numbness of the face, arm, or leg, especially on one side of the body.  Your face or eyelid droops to one side.  You have sudden confusion.  You have trouble speaking (aphasia) or understanding.  You have sudden trouble seeing in one or both eyes.  You have sudden trouble walking.  You have dizziness.  You have a loss of balance or coordination.  You have a sudden, severe headache with no known cause.  You have new chest pain or an irregular heartbeat. Any of these symptoms may represent a serious problem that is an emergency. Do not wait to see if the symptoms will go away. Get medical help at once. Call your local emergency services (911 in [place] Do not drive yourself to the hospital. Document Released: 07/12/2004 Document Revised: 10/19/2013 Document Reviewed: 12/05/2012 Exit[employer] Patient Information 2015 [employer]. This information is not intended to replace advice given   to you by your health care provider. Make sure you discuss any questions you have with your health care provider.  

## 2015-02-22 NOTE — Addendum Note (Signed)
Addended by: Dorthula Rue L on: 02/22/2015 01:31 PM   Modules accepted: Orders

## 2015-02-25 ENCOUNTER — Other Ambulatory Visit: Payer: Self-pay | Admitting: Family Medicine

## 2015-02-25 DIAGNOSIS — E785 Hyperlipidemia, unspecified: Secondary | ICD-10-CM

## 2015-03-07 ENCOUNTER — Ambulatory Visit
Admission: RE | Admit: 2015-03-07 | Discharge: 2015-03-07 | Disposition: A | Discharge status: Home / Self Care | Payer: 59 | Source: Ambulatory Visit | Attending: Gastroenterology | Admitting: Gastroenterology

## 2015-03-07 DIAGNOSIS — K449 Diaphragmatic hernia without obstruction or gangrene: Secondary | ICD-10-CM | POA: Insufficient documentation

## 2015-03-07 DIAGNOSIS — K222 Esophageal obstruction: Secondary | ICD-10-CM | POA: Diagnosis not present

## 2015-03-07 DIAGNOSIS — R1310 Dysphagia, unspecified: Secondary | ICD-10-CM | POA: Insufficient documentation

## 2015-03-24 ENCOUNTER — Other Ambulatory Visit: Payer: Self-pay | Admitting: Family Medicine

## 2015-04-05 ENCOUNTER — Other Ambulatory Visit: Payer: Self-pay | Admitting: Family Medicine

## 2015-05-05 ENCOUNTER — Other Ambulatory Visit: Payer: Self-pay | Admitting: Family Medicine

## 2015-05-06 ENCOUNTER — Other Ambulatory Visit: Payer: Self-pay

## 2015-06-03 ENCOUNTER — Other Ambulatory Visit: Payer: Self-pay | Admitting: Family Medicine

## 2015-06-06 ENCOUNTER — Ambulatory Visit (INDEPENDENT_AMBULATORY_CARE_PROVIDER_SITE_OTHER): Payer: 59 | Admitting: Family Medicine

## 2015-06-06 ENCOUNTER — Encounter: Payer: Self-pay | Admitting: Family Medicine

## 2015-06-06 VITALS — BP 120/64 | HR 64 | Ht 64.0 in | Wt 180.0 lb

## 2015-06-06 DIAGNOSIS — I679 Cerebrovascular disease, unspecified: Secondary | ICD-10-CM | POA: Diagnosis not present

## 2015-06-06 DIAGNOSIS — K219 Gastro-esophageal reflux disease without esophagitis: Secondary | ICD-10-CM | POA: Diagnosis not present

## 2015-06-06 DIAGNOSIS — I10 Essential (primary) hypertension: Secondary | ICD-10-CM | POA: Diagnosis not present

## 2015-06-06 DIAGNOSIS — R69 Illness, unspecified: Secondary | ICD-10-CM

## 2015-06-06 DIAGNOSIS — E785 Hyperlipidemia, unspecified: Secondary | ICD-10-CM | POA: Diagnosis not present

## 2015-06-06 MED ORDER — LOSARTAN POTASSIUM 50 MG PO TABS: ORAL_TABLET | ORAL | Status: DC

## 2015-06-06 MED ORDER — CLOPIDOGREL BISULFATE 75 MG PO TABS: ORAL_TABLET | ORAL | Status: DC

## 2015-06-06 MED ORDER — SIMVASTATIN 20 MG PO TABS: 20.0000 mg | ORAL_TABLET | Freq: Every day | ORAL | Status: DC

## 2015-06-06 NOTE — Progress Notes (Signed)
Name: Ellen Baker   MRN: [id number]    DOB: [birth year]   Date:06/06/2015       Progress Note  Subjective  Chief Complaint  Chief Complaint  Patient presents with  . Hypertension  . Hyperlipidemia  . Cerebrovascular Accident    ministroke- takes Plavix     Hypertension This is a chronic problem. The current episode started more than 1 year ago. The problem has been gradually improving since onset. The problem is controlled. Pertinent negatives include no anxiety, blurred vision, chest pain, headaches, malaise/fatigue, neck pain, orthopnea, palpitations, peripheral edema, PND, shortness of breath or sweats. There are no associated agents to hypertension. There are no known risk factors for coronary artery disease. Past treatments include angiotensin blockers. The current treatment provides moderate improvement. There are no compliance problems.  Hypertensive end-organ damage includes CVA. There is no history of angina, kidney disease, CAD/MI, heart failure, left ventricular hypertrophy, PVD, renovascular disease or retinopathy. There is no history of chronic renal disease or a hypertension causing med.  Hyperlipidemia This is a chronic problem. The current episode started more than 1 year ago. The problem is controlled. Recent lipid tests were reviewed and are normal. She has no history of chronic renal disease, diabetes, hypothyroidism, liver disease, obesity or nephrotic syndrome. There are no known factors aggravating her hyperlipidemia. Pertinent negatives include no chest pain, focal sensory loss, focal weakness, leg pain, myalgias or shortness of breath. Current antihyperlipidemic treatment includes statins. The current treatment provides moderate improvement of lipids. There are no compliance problems.  Risk factors for coronary artery disease include dyslipidemia and hypertension.  Cerebrovascular Accident This is a chronic problem. The current episode started more than 1 year ago. The  problem has been resolved. Pertinent negatives include no abdominal pain, chest pain, chills, coughing, fever, headaches, myalgias, nausea, neck pain, rash or sore throat. The treatment provided mild relief.    No problem-specific assessment & plan notes found for this encounter.   Past Medical History  Diagnosis Date  . Hypertension   . Heart murmur   . GERD (gastroesophageal reflux disease)   . Arthritis     right knee  . Stroke ([facility]) 12-31-12    no residual effects  . Carotid artery occlusion   . Hyperlipidemia     Past Surgical History  Procedure Laterality Date  . Tubal ligation    . Colonoscopy  08-18-12  . Upper gi endoscopy  08-18-12  . Esophageal dilation    . Endarterectomy Right 01/19/2013    Procedure: ENDARTERECTOMY CAROTID with patch angioplasty;  Surgeon: Conrad [place], MD;  Location: [facility] OR;  Service: Vascular;  Laterality: Right;  . Carotid endarterectomy      RIGHT  01/19/13    Family History  Problem Relation Age of Onset  . Stroke Mother   . Deep vein thrombosis Mother   . Heart disease Mother     Amputation-  . Hypertension Mother   . Alzheimer's disease Mother   . Heart failure Father   . Heart disease Father     CF  . Hypertension Father   . Varicose Veins Father   . Parkinson's disease Father     Social History   Social History  . Marital Status: Widowed    Spouse Name: N/A  . Number of Children: N/A  . Years of Education: N/A   Occupational History  . Not on file.   Social History Main Topics  . Smoking status: Never Smoker   .  Smokeless tobacco: Never Used  . Alcohol Use: No  . Drug Use: No  . Sexual Activity: No   Other Topics Concern  . Not on file   Social History Narrative    Allergies  Allergen Reactions  . Adhesive [Tape] Rash    Skin became RAW     Review of Systems  Constitutional: Negative for fever, chills, weight loss and malaise/fatigue.  HENT: Negative for ear discharge, ear pain and sore throat.   Eyes:  Negative for blurred vision.  Respiratory: Negative for cough, sputum production, shortness of breath and wheezing.   Cardiovascular: Negative for chest pain, palpitations, orthopnea, leg swelling and PND.  Gastrointestinal: Negative for heartburn, nausea, abdominal pain, diarrhea, constipation, blood in stool and melena.  Genitourinary: Negative for dysuria, urgency, frequency and hematuria.  Musculoskeletal: Negative for myalgias, back pain, joint pain and neck pain.  Skin: Negative for rash.  Neurological: Negative for dizziness, tingling, sensory change, focal weakness and headaches.  Endo/Heme/Allergies: Negative for environmental allergies and polydipsia. Does not bruise/bleed easily.  Psychiatric/Behavioral: Negative for depression and suicidal ideas. The patient is not nervous/anxious and does not have insomnia.      Objective  Filed Vitals:   06/06/15 0912  BP: 120/64  Pulse: 64  Height: 5\' 4"  (1.626 m)  Weight: 180 lb (81.647 kg)    Physical Exam  Constitutional: She is well-developed, well-nourished, and in no distress. No distress.  HENT:  Head: Normocephalic and atraumatic.  Right Ear: External ear normal.  Left Ear: External ear normal.  Nose: Nose normal.  Mouth/Throat: Oropharynx is clear and moist.  Eyes: Conjunctivae and EOM are normal. Pupils are equal, round, and reactive to light. Right eye exhibits no discharge. Left eye exhibits no discharge.  Neck: Normal range of motion. Neck supple. No JVD present. No thyromegaly present.  Cardiovascular: Normal rate, regular rhythm, normal heart sounds and intact distal pulses.  Exam reveals no gallop and no friction rub.   No murmur heard. Pulmonary/Chest: Effort normal and breath sounds normal.  Abdominal: Soft. Bowel sounds are normal. She exhibits no mass. There is no tenderness. There is no guarding.  Musculoskeletal: Normal range of motion. She exhibits no edema.  Lymphadenopathy:    She has no cervical  adenopathy.  Neurological: She is alert. She has normal reflexes.  Skin: Skin is warm and dry. She is not diaphoretic.  Psychiatric: Mood and affect normal.      Assessment & Plan  Problem List Items Addressed This Visit      Cardiovascular and Mediastinum   HTN (hypertension) - Primary   Relevant Medications   losartan (COZAAR) 50 MG tablet   simvastatin (ZOCOR) 20 MG tablet   Other Relevant Orders   Renal Function Panel   Cerebral vascular insufficiency   Relevant Medications   clopidogrel (PLAVIX) 75 MG tablet   losartan (COZAAR) 50 MG tablet   simvastatin (ZOCOR) 20 MG tablet     Digestive   GERD (gastroesophageal reflux disease)     Other   Dyslipidemia   Relevant Medications   simvastatin (ZOCOR) 20 MG tablet   Other Relevant Orders   Lipid Profile    Other Visit Diagnoses    Taking multiple medications for chronic disease        Relevant Orders    CBC w/Diff/Platelet         Dr. Otilio Miu [facility]  06/06/2015

## 2015-06-07 LAB — LIPID PANEL
CHOLESTEROL TOTAL: 179 mg/dL (ref 100–199)
Chol/HDL Ratio: 2.7 ratio units (ref 0.0–4.4)
HDL: 67 mg/dL (ref >39)
LDL Calculated: 95 mg/dL (ref 0–99)
Triglycerides: 83 mg/dL (ref 0–149)
VLDL Cholesterol Cal: 17 mg/dL (ref 5–40)

## 2015-06-07 LAB — CBC WITH DIFFERENTIAL/PLATELET
BASOS ABS: 0 10*3/uL (ref 0.0–0.2)
Basos: 0 %
EOS (ABSOLUTE): 0.2 10*3/uL (ref 0.0–0.4)
Eos: 4 %
HEMOGLOBIN: 12.9 g/dL (ref 11.1–15.9)
Hematocrit: 39.4 % (ref 34.0–46.6)
Immature Grans (Abs): 0 10*3/uL (ref 0.0–0.1)
Immature Granulocytes: 0 %
LYMPHS ABS: 1.4 10*3/uL (ref 0.7–3.1)
LYMPHS: 26 %
MCH: 29.6 pg (ref 26.6–33.0)
MCHC: 32.7 g/dL (ref 31.5–35.7)
MCV: 90 fL (ref 79–97)
MONOCYTES: 8 %
Monocytes Absolute: 0.4 10*3/uL (ref 0.1–0.9)
Neutrophils Absolute: 3.2 10*3/uL (ref 1.4–7.0)
Neutrophils: 62 %
Platelets: 237 10*3/uL (ref 150–379)
RBC: 4.36 x10E6/uL (ref 3.77–5.28)
RDW: 13.2 % (ref 12.3–15.4)
WBC: 5.2 10*3/uL (ref 3.4–10.8)

## 2015-06-07 LAB — RENAL FUNCTION PANEL
ALBUMIN: 3.9 g/dL (ref 3.6–4.8)
BUN / CREAT RATIO: 16 (ref 11–26)
BUN: 12 mg/dL (ref 8–27)
CALCIUM: 9.4 mg/dL (ref 8.7–10.3)
CHLORIDE: 101 mmol/L (ref 96–106)
CO2: 29 mmol/L (ref 18–29)
Creatinine, Ser: 0.73 mg/dL (ref 0.57–1.00)
GFR calc Af Amer: 101 mL/min/{1.73_m2} (ref >59)
GFR calc non Af Amer: 87 mL/min/{1.73_m2} (ref >59)
Glucose: 78 mg/dL (ref 65–99)
PHOSPHORUS: 3.9 mg/dL (ref 2.5–4.5)
Potassium: 4.5 mmol/L (ref 3.5–5.2)
Sodium: 143 mmol/L (ref 134–144)

## 2015-11-21 ENCOUNTER — Encounter: Payer: Self-pay | Admitting: *Deleted

## 2015-11-21 ENCOUNTER — Ambulatory Visit
Admission: EM | Admit: 2015-11-21 | Discharge: 2015-11-21 | Disposition: A | Discharge status: Home / Self Care | Payer: BLUE CROSS/BLUE SHIELD | Attending: Emergency Medicine | Admitting: Emergency Medicine

## 2015-11-21 DIAGNOSIS — T148 Other injury of unspecified body region: Secondary | ICD-10-CM | POA: Diagnosis not present

## 2015-11-21 DIAGNOSIS — W57XXXA Bitten or stung by nonvenomous insect and other nonvenomous arthropods, initial encounter: Secondary | ICD-10-CM | POA: Diagnosis not present

## 2015-11-21 MED ORDER — MUPIROCIN 2 % EX OINT: TOPICAL_OINTMENT | CUTANEOUS | Status: DC

## 2015-11-21 NOTE — ED Provider Notes (Signed)
[facility]  ____________________________________________  Time seen: Approximately 5:26 PM  I have reviewed the triage vital signs and the nursing notes.   HISTORY  Chief Complaint Tick Removal   HPI Ellen Baker is a 65 y.o. female presents for complaints of tick removal. Patient reports she is not an outdoor person and not outside very often, but reports this past Saturday she did walk with her cousin in her cousin's backyard. Patient reports she thinks she picked up a tick when she was outside on Saturday. States today she noticed left upper inner leg a black mark and thinks its a tick. States she can not see that area too well, and cant see to remove the tick. Denies rash or fever. Denies other tick or insect bite.   Denies recent sickness. Denies fevers, weakness, rash, chest pain, shortness of breath, abdominal pain, myalgias, headaches, or other complaints. States she feels fine but wants the tick removed.   PCP: Ronnald Ramp   Past Medical History  Diagnosis Date  . Hypertension   . Heart murmur   . GERD (gastroesophageal reflux disease)   . Arthritis     right knee  . Stroke ([place]) 12-31-12    no residual effects  . Carotid artery occlusion   . Hyperlipidemia     Patient Active Problem List   Diagnosis Date Noted  . Cerebral vascular insufficiency 06/06/2015  . Dyslipidemia 06/06/2015  . Aftercare following surgery of the circulatory system, [place] 08/03/2013  . Occlusion and stenosis of carotid artery without mention of cerebral infarction 01/16/2013  . Pre-operative cardiovascular examination 01/16/2013  . Carotid artery stenosis, symptomatic 01/02/2013  . CVA (cerebral infarction) 12/31/2012  . HTN (hypertension) 12/31/2012  . GERD (gastroesophageal reflux disease) 12/31/2012    Past Surgical History  Procedure Laterality Date  . Tubal ligation    . Colonoscopy  08-18-12  . Upper gi endoscopy  08-18-12  . Esophageal dilation    . Endarterectomy Right  01/19/2013    Procedure: ENDARTERECTOMY CAROTID with patch angioplasty;  Surgeon: Conrad , MD;  Location: [facility] OR;  Service: Vascular;  Laterality: Right;  . Carotid endarterectomy      RIGHT  01/19/13    Current Outpatient Rx  Name  Route  Sig  Dispense  Refill  . Biotin w/ Vitamins C & E (HAIR SKIN & NAILS GUMMIES PO)   Oral   Take 2 each by mouth daily.         . Calcium Carb-Ergocalciferol (CHEWABLE CALCIUM/D PO)   Oral   Take 1 tablet by mouth daily.         . clopidogrel (PLAVIX) 75 MG tablet      TAKE 1 TABLET(75 MG) BY MOUTH DAILY WITH BREAKFAST   30 tablet   6     Needs refill appt   . loratadine (CLARITIN) 10 MG tablet   Oral   Take 10 mg by mouth continuous as needed for allergies.         Marland Kitchen losartan (COZAAR) 50 MG tablet      TAKE 1 TABLET(50 MG) BY MOUTH DAILY   30 tablet   6     Needs refill appt   . Menthol, Topical Analgesic, (ICY HOT EX)   Apply externally   Apply 1 application topically 2 (two) times daily as needed (knee pain).         . Naproxen Sodium (ALEVE PO)   Oral   Take by mouth continuous as needed.         Marland Kitchen  Omeprazole (PRILOSEC PO)   Oral   Take by mouth. Patient is not sure of doseage- 20mg  qday otc         . simvastatin (ZOCOR) 20 MG tablet   Oral   Take 1 tablet (20 mg total) by mouth daily.   30 tablet   6     Needs refill appt     Allergies Adhesive  Family History  Problem Relation Age of Onset  . Stroke Mother   . Deep vein thrombosis Mother   . Heart disease Mother     Amputation-  . Hypertension Mother   . Alzheimer's disease Mother   . Heart failure Father   . Heart disease Father     CF  . Hypertension Father   . Varicose Veins Father   . Parkinson's disease Father     Social History Social History  Substance Use Topics  . Smoking status: Never Smoker   . Smokeless tobacco: Never Used  . Alcohol Use: No    Review of Systems Constitutional: No fever/chills Eyes: No visual  changes. ENT: No sore throat. Cardiovascular: Denies chest pain. Respiratory: Denies shortness of breath. Gastrointestinal: No abdominal pain.  No nausea, no vomiting.  No diarrhea.  No constipation. Genitourinary: Negative for dysuria. Musculoskeletal: Negative for back pain. Skin: Negative for rash. As above.  Neurological: Negative for headaches, focal weakness or numbness.  10-point ROS otherwise negative.  ____________________________________________   PHYSICAL EXAM:  VITAL SIGNS: ED Triage Vitals  Enc Vitals Group     BP 11/21/15 1705 172/98 mmHg     Pulse Rate 11/21/15 1705 67     Resp 11/21/15 1705 16     Temp 11/21/15 1705 97.6 F (36.4 C)     Temp Source 11/21/15 1705 Oral     SpO2 11/21/15 1705 96 %     Weight 11/21/15 1705 190 lb (86.183 kg)     Height 11/21/15 1705 5\' 6"  (1.676 m)     Head Cir --      Peak Flow --      Pain Score --      Pain Loc --      Pain Edu? --      Excl. in [place]? --    Today's Vitals   11/21/15 1705 11/21/15 1733  BP: 172/98 154/88  Pulse: 67   Temp: 97.6 F (36.4 C)   TempSrc: Oral   Resp: 16   Height: 5\' 6"  (1.676 m)   Weight: 190 lb (86.183 kg)   SpO2: 96%   States she felt initial blood pressure was higher as she was frustrated with her boss as she had to leave work to come to urgent care.   Constitutional: Alert and oriented. Well appearing and in no acute distress. Eyes: Conjunctivae are normal. PERRL. EOMI. Head: Atraumatic.  Mouth/Throat: Mucous membranes are moist.   Neck: No stridor.  No cervical spine tenderness to palpation. Cardiovascular: Normal rate, regular rhythm. Grossly normal heart sounds.  Good peripheral circulation. Respiratory: Normal respiratory effort.  No retractions. Lungs CTAB. Gastrointestinal: Soft and nontender.  Musculoskeletal: No lower or upper extremity tenderness nor edema.  No lower extremity tenderness or swelling. No calf tenderness bilaterally. Bilateral pedal pulses equal and easily  palpated.  Neurologic:  Normal speech and language. No gross focal neurologic deficits are appreciated. No gait instability. Skin:  Skin is warm, dry and intact. No rash noted. Except: Left upper inner thigh small black tick attached, minimal induration at attachment site, no  swelling, nontender. No surrounding erythema. No surrounding erythema, rash, induration or fluctuance. Post tick removal, no residual foreign bodies, tenderness, or erythema.  Psychiatric: Mood and affect are normal. Speech and behavior are normal.  ____________________________________________   LABS (all labs ordered are listed, but only abnormal results are displayed)  Labs Reviewed - No data to display  RADIOLOGY  No results found. ____________________________________________   PROCEDURES  Procedure(s) performed:  Procedure explained and verbal consent obtained. Left inner thigh tick removal with sterile forceps, no remaining foreign bodies visualized, tick head intact. Patient tolerated well.  ____________________________________________   INITIAL IMPRESSION / ASSESSMENT AND PLAN / ED COURSE  Pertinent labs & imaging results that were available during my care of the patient were reviewed by me and considered in my medical decision making (see chart for details).  Very well appearing. No acute distress. Presents for left upper leg tick removal. Denies other complaints. No rash. Tick present and removed. Discussed with patient, monitor area and as no rash or other complaints will defer oral antibiotics at this time. Will treat with oral topical antibiotic bactroban and monitoring. Discussed indication, risks and benefits of medications with patient.  Discussed follow up with Primary care physician this week. Discussed follow up and return parameters including redness, rash, swelling, drainage, weakness, headaches, fevers, no resolution or any worsening concerns. Patient verbalized understanding and agreed to  plan.   ____________________________________________   FINAL CLINICAL IMPRESSION(S) / ED DIAGNOSES  Final diagnoses:  Tick bite with subsequent removal of tick     New Prescriptions   MUPIROCIN OINTMENT (BACTROBAN) 2 %    Apply three times a day for 5 days.    Note: This dictation was prepared with Dragon dictation along with smaller phrase technology. Any transcriptional errors that result from this process are unintentional.       Marylene Land, NP 11/21/15 [place], NP 11/21/15 1759

## 2015-11-21 NOTE — ED Notes (Signed)
Tick removal from groin region. Pt thinks the tick has been there since Saturday.

## 2015-11-21 NOTE — Discharge Instructions (Signed)
Use medication as prescribed. Rest. Drink plenty of fluids. Monitor area closely.   Follow up with your primary care physician this week as needed. Return to Urgent care or ER for redness, rash, swelling, drainage, headaches, fevers, new or worsening concerns.    Tick Bite Information Ticks are insects that attach themselves to the skin and draw blood for food. There are various types of ticks. Common types include wood ticks and deer ticks. Most ticks live in shrubs and grassy areas. Ticks can climb onto your body when you make contact with leaves or grass where the tick is waiting. The most common places on the body for ticks to attach themselves are the scalp, neck, armpits, waist, and groin. Most tick bites are harmless, but sometimes ticks carry germs that cause diseases. These germs can be spread to a person during the tick's feeding process. The chance of a disease spreading through a tick bite depends on:   The type of tick.  Time of year.   How long the tick is attached.   Geographic location.  HOW CAN YOU PREVENT TICK BITES? Take these steps to help prevent tick bites when you are outdoors:  Wear protective clothing. Long sleeves and long pants are best.   Wear white clothes so you can see ticks more easily.  Tuck your pant legs into your socks.   If walking on a trail, stay in the middle of the trail to avoid brushing against bushes.  Avoid walking through areas with long grass.  Put insect repellent on all exposed skin and along boot tops, pant legs, and sleeve cuffs.   Check clothing, hair, and skin repeatedly and before going inside.   Brush off any ticks that are not attached.  Take a shower or bath as soon as possible after being outdoors.  WHAT IS THE PROPER WAY TO REMOVE A TICK? Ticks should be removed as soon as possible to help prevent diseases caused by tick bites. 1. If latex gloves are available, put them on before trying to remove a tick.   2. Using fine-point tweezers, grasp the tick as close to the skin as possible. You may also use curved forceps or a tick removal tool. Grasp the tick as close to its head as possible. Avoid grasping the tick on its body. 3. Pull gently with steady upward pressure until the tick lets go. Do not twist the tick or jerk it suddenly. This may break off the tick's head or mouth parts. 4. Do not squeeze or crush the tick's body. This could force disease-carrying fluids from the tick into your body.  5. After the tick is removed, wash the bite area and your hands with soap and water or other disinfectant such as alcohol. 6. Apply a small amount of antiseptic cream or ointment to the bite site.  7. Wash and disinfect any instruments that were used.  Do not try to remove a tick by applying a hot match, petroleum jelly, or fingernail polish to the tick. These methods do not work and may increase the chances of disease being spread from the tick bite.  WHEN SHOULD YOU SEEK MEDICAL CARE? Contact your health care provider if you are unable to remove a tick from your skin or if a part of the tick breaks off and is stuck in the skin.  After a tick bite, you need to be aware of signs and symptoms that could be related to diseases spread by ticks. Contact your health care  provider if you develop any of the following in the days or weeks after the tick bite:  Unexplained fever.  Rash. A circular rash that appears days or weeks after the tick bite may indicate the possibility of Lyme disease. The rash may resemble a target with a bull's-eye and may occur at a different part of your body than the tick bite.  Redness and swelling in the area of the tick bite.   Tender, swollen lymph glands.   Diarrhea.   Weight loss.   Cough.   Fatigue.   Muscle, joint, or bone pain.   Abdominal pain.   Headache.   Lethargy or a change in your level of consciousness.  Difficulty walking or moving your  legs.   Numbness in the legs.   Paralysis.  Shortness of breath.   Confusion.   Repeated vomiting.    This information is not intended to replace advice given to you by your health care provider. Make sure you discuss any questions you have with your health care provider.   Document Released: 06/01/2000 Document Revised: 06/25/2014 Document Reviewed: 11/12/2012 Elsevier Interactive Patient Education [employer].

## 2015-12-05 ENCOUNTER — Ambulatory Visit
Admission: RE | Admit: 2015-12-05 | Discharge: 2015-12-05 | Disposition: A | Discharge status: Home / Self Care | Payer: BLUE CROSS/BLUE SHIELD | Source: Ambulatory Visit | Attending: Family Medicine | Admitting: Family Medicine

## 2015-12-05 ENCOUNTER — Ambulatory Visit (INDEPENDENT_AMBULATORY_CARE_PROVIDER_SITE_OTHER): Payer: BLUE CROSS/BLUE SHIELD | Admitting: Family Medicine

## 2015-12-05 ENCOUNTER — Encounter: Payer: Self-pay | Admitting: Family Medicine

## 2015-12-05 VITALS — BP 128/80 | HR 82 | Ht 66.0 in | Wt 194.0 lb

## 2015-12-05 DIAGNOSIS — I679 Cerebrovascular disease, unspecified: Secondary | ICD-10-CM

## 2015-12-05 DIAGNOSIS — M5137 Other intervertebral disc degeneration, lumbosacral region: Secondary | ICD-10-CM | POA: Insufficient documentation

## 2015-12-05 DIAGNOSIS — K219 Gastro-esophageal reflux disease without esophagitis: Secondary | ICD-10-CM

## 2015-12-05 DIAGNOSIS — S39012A Strain of muscle, fascia and tendon of lower back, initial encounter: Secondary | ICD-10-CM

## 2015-12-05 DIAGNOSIS — H8309 Labyrinthitis, unspecified ear: Secondary | ICD-10-CM

## 2015-12-05 DIAGNOSIS — X58XXXA Exposure to other specified factors, initial encounter: Secondary | ICD-10-CM | POA: Diagnosis not present

## 2015-12-05 DIAGNOSIS — I10 Essential (primary) hypertension: Secondary | ICD-10-CM | POA: Diagnosis not present

## 2015-12-05 DIAGNOSIS — E785 Hyperlipidemia, unspecified: Secondary | ICD-10-CM

## 2015-12-05 DIAGNOSIS — S338XXA Sprain of other parts of lumbar spine and pelvis, initial encounter: Secondary | ICD-10-CM | POA: Insufficient documentation

## 2015-12-05 MED ORDER — SIMVASTATIN 20 MG PO TABS: 20.0000 mg | ORAL_TABLET | Freq: Every day | ORAL | Status: DC

## 2015-12-05 MED ORDER — MECLIZINE HCL 25 MG PO TABS: 25.0000 mg | ORAL_TABLET | Freq: Three times a day (TID) | ORAL | Status: DC | PRN

## 2015-12-05 MED ORDER — AZITHROMYCIN 250 MG PO TABS: ORAL_TABLET | ORAL | Status: DC

## 2015-12-05 MED ORDER — CLOPIDOGREL BISULFATE 75 MG PO TABS: ORAL_TABLET | ORAL | Status: DC

## 2015-12-05 MED ORDER — LOSARTAN POTASSIUM 50 MG PO TABS
ORAL_TABLET | ORAL | Status: DC
Start: 2015-12-05 — End: 2016-06-05

## 2015-12-05 NOTE — Progress Notes (Signed)
Name: Ellen Baker   MRN: [id number]    DOB: [birth year]   Date:12/05/2015       Progress Note  Subjective  Chief Complaint  Chief Complaint  Patient presents with  . Hypertension  . Hyperlipidemia  . Transient Ischemic Attack    takes Plavix    Hypertension This is a chronic problem. The current episode started more than 1 year ago. The problem has been gradually improving since onset. The problem is controlled. Pertinent negatives include no anxiety, blurred vision, chest pain, headaches, malaise/fatigue, neck pain, orthopnea, palpitations, peripheral edema, PND, shortness of breath or sweats. There are no associated agents to hypertension. Past treatments include angiotensin blockers. The current treatment provides moderate improvement. There are no compliance problems.  There is no history of angina, kidney disease, CAD/MI, CVA, heart failure, left ventricular hypertrophy, PVD, renovascular disease or retinopathy. There is no history of chronic renal disease or a hypertension causing med.  Hyperlipidemia This is a chronic problem. The current episode started more than 1 year ago. The problem is controlled. She has no history of chronic renal disease, diabetes, hypothyroidism, liver disease, obesity or nephrotic syndrome. Pertinent negatives include no chest pain or shortness of breath. The current treatment provides mild improvement of lipids. There are no compliance problems.   Other This is a chronic (past hx tia) problem. The current episode started more than 1 year ago. The problem has been gradually improving. Associated symptoms include nausea and vertigo. Pertinent negatives include no chest pain, congestion, headaches, neck pain, numbness, visual change or weakness. She has tried NSAIDs for the symptoms. The treatment provided mild relief.  Dizziness This is a new problem. The current episode started yesterday. The problem occurs intermittently. The problem has been gradually  improving. Associated symptoms include nausea and vertigo. Pertinent negatives include no chest pain, congestion, headaches, neck pain, numbness, visual change or weakness. Exacerbated by: extension of head. She has tried nothing for the symptoms. The treatment provided mild relief.  Back Pain This is a chronic problem. The current episode started in the past 7 days. The problem occurs daily. Pertinent negatives include no chest pain, headaches, numbness or weakness.    No problem-specific assessment & plan notes found for this encounter.   Past Medical History  Diagnosis Date  . Hypertension   . Heart murmur   . GERD (gastroesophageal reflux disease)   . Arthritis     right knee  . Stroke ([place]) 12-31-12    no residual effects  . Carotid artery occlusion   . Hyperlipidemia     Past Surgical History  Procedure Laterality Date  . Tubal ligation    . Colonoscopy  08-18-12  . Upper gi endoscopy  08-18-12  . Esophageal dilation    . Endarterectomy Right 01/19/2013    Procedure: ENDARTERECTOMY CAROTID with patch angioplasty;  Surgeon: Conrad Ferron, MD;  Location: [facility] OR;  Service: Vascular;  Laterality: Right;  . Carotid endarterectomy      RIGHT  01/19/13    Family History  Problem Relation Age of Onset  . Stroke Mother   . Deep vein thrombosis Mother   . Heart disease Mother     Amputation-  . Hypertension Mother   . Alzheimer's disease Mother   . Heart failure Father   . Heart disease Father     CF  . Hypertension Father   . Varicose Veins Father   . Parkinson's disease Father     Social History   Social  History  . Marital Status: Widowed    Spouse Name: N/A  . Number of Children: N/A  . Years of Education: N/A   Occupational History  . Not on file.   Social History Main Topics  . Smoking status: Never Smoker   . Smokeless tobacco: Never Used  . Alcohol Use: No  . Drug Use: No  . Sexual Activity: No   Other Topics Concern  . Not on file   Social History  Narrative    Allergies  Allergen Reactions  . Adhesive [Tape] Rash    Skin became RAW     Review of Systems  Constitutional: Negative for malaise/fatigue.  HENT: Negative for congestion.   Eyes: Negative for blurred vision.  Respiratory: Negative for shortness of breath.   Cardiovascular: Negative for chest pain, palpitations, orthopnea and PND.  Gastrointestinal: Positive for nausea.  Musculoskeletal: Positive for back pain. Negative for neck pain.  Neurological: Positive for dizziness and vertigo. Negative for weakness, numbness and headaches.     Objective  Filed Vitals:   12/05/15 0809  BP: 128/80  Pulse: 82  Height: 5\' 6"  (1.676 m)  Weight: 194 lb (87.998 kg)    Physical Exam  Constitutional: She is well-developed, well-nourished, and in no distress. No distress.  HENT:  Head: Normocephalic and atraumatic.  Right Ear: External ear and ear canal normal. A middle ear effusion is present.  Left Ear: External ear and ear canal normal. A middle ear effusion is present.  Nose: Nose normal.  Mouth/Throat: Oropharynx is clear and moist.  Eyes: Conjunctivae and EOM are normal. Pupils are equal, round, and reactive to light. Right eye exhibits no discharge. Left eye exhibits no discharge.  Neck: Normal range of motion. Neck supple. No JVD present. No thyromegaly present.  Cardiovascular: Normal rate, regular rhythm, normal heart sounds and intact distal pulses.  Exam reveals no gallop and no friction rub.   No murmur heard. Pulmonary/Chest: Effort normal and breath sounds normal.  Abdominal: Soft. Bowel sounds are normal. She exhibits no mass. There is no tenderness. There is no guarding.  Musculoskeletal: Normal range of motion. She exhibits no edema.  Lymphadenopathy:    She has no cervical adenopathy.  Neurological: She is alert. She has normal motor skills, normal sensation, normal strength, normal reflexes and intact cranial nerves. She has a normal Straight Leg Raise  Test.  Skin: Skin is warm and dry. She is not diaphoretic.  Psychiatric: Mood and affect normal.  Nursing note and vitals reviewed.     Assessment & Plan  Problem List Items Addressed This Visit      Cardiovascular and Mediastinum   HTN (hypertension) - Primary   Relevant Medications   simvastatin (ZOCOR) 20 MG tablet   losartan (COZAAR) 50 MG tablet   Other Relevant Orders   Renal Function Panel   Cerebral vascular insufficiency   Relevant Medications   clopidogrel (PLAVIX) 75 MG tablet   simvastatin (ZOCOR) 20 MG tablet   losartan (COZAAR) 50 MG tablet   Other Relevant Orders   CBC     Digestive   GERD (gastroesophageal reflux disease)   Relevant Medications   meclizine (ANTIVERT) 25 MG tablet     Other   Dyslipidemia   Relevant Medications   simvastatin (ZOCOR) 20 MG tablet   Other Relevant Orders   Lipid Profile    Other Visit Diagnoses    Labyrinthitis, acute, unspecified laterality        Relevant Medications    meclizine (  ANTIVERT) 25 MG tablet    azithromycin (ZITHROMAX) 250 MG tablet    Lumbosacral strain, initial encounter        tylenol    Relevant Orders    DG Lumbar Spine Complete         Dr. Otilio Miu [facility]  12/05/2015

## 2015-12-06 LAB — RENAL FUNCTION PANEL
Albumin: 3.9 g/dL (ref 3.6–4.8)
BUN / CREAT RATIO: 17 (ref 12–28)
BUN: 12 mg/dL (ref 8–27)
CALCIUM: 9.1 mg/dL (ref 8.7–10.3)
CHLORIDE: 100 mmol/L (ref 96–106)
CO2: 23 mmol/L (ref 18–29)
CREATININE: 0.7 mg/dL (ref 0.57–1.00)
GFR calc Af Amer: 106 mL/min/{1.73_m2} (ref >59)
GFR calc non Af Amer: 92 mL/min/{1.73_m2} (ref >59)
GLUCOSE: 79 mg/dL (ref 65–99)
PHOSPHORUS: 3.9 mg/dL (ref 2.5–4.5)
POTASSIUM: 4.5 mmol/L (ref 3.5–5.2)
SODIUM: 145 mmol/L — AB (ref 134–144)

## 2015-12-06 LAB — CBC
HEMATOCRIT: 41.1 % (ref 34.0–46.6)
HEMOGLOBIN: 13.4 g/dL (ref 11.1–15.9)
MCH: 29.7 pg (ref 26.6–33.0)
MCHC: 32.6 g/dL (ref 31.5–35.7)
MCV: 91 fL (ref 79–97)
Platelets: 256 10*3/uL (ref 150–379)
RBC: 4.51 x10E6/uL (ref 3.77–5.28)
RDW: 13.5 % (ref 12.3–15.4)
WBC: 5.8 10*3/uL (ref 3.4–10.8)

## 2015-12-06 LAB — LIPID PANEL
CHOLESTEROL TOTAL: 179 mg/dL (ref 100–199)
Chol/HDL Ratio: 2.8 ratio units (ref 0.0–4.4)
HDL: 64 mg/dL (ref >39)
LDL CALC: 97 mg/dL (ref 0–99)
Triglycerides: 91 mg/dL (ref 0–149)
VLDL Cholesterol Cal: 18 mg/dL (ref 5–40)

## 2016-02-14 ENCOUNTER — Encounter: Payer: Self-pay | Admitting: Family

## 2016-02-24 ENCOUNTER — Ambulatory Visit (HOSPITAL_COMMUNITY)
Admission: RE | Admit: 2016-02-24 | Discharge: 2016-02-24 | Disposition: A | Discharge status: Home / Self Care | Payer: PPO | Source: Ambulatory Visit | Attending: Family | Admitting: Family

## 2016-02-24 ENCOUNTER — Encounter: Payer: Self-pay | Admitting: Family

## 2016-02-24 ENCOUNTER — Ambulatory Visit (INDEPENDENT_AMBULATORY_CARE_PROVIDER_SITE_OTHER): Payer: PPO | Admitting: Family

## 2016-02-24 VITALS — BP 196/103 | HR 64 | Ht 66.0 in | Wt 200.6 lb

## 2016-02-24 DIAGNOSIS — Z48812 Encounter for surgical aftercare following surgery on the circulatory system: Secondary | ICD-10-CM | POA: Diagnosis not present

## 2016-02-24 DIAGNOSIS — R030 Elevated blood-pressure reading, without diagnosis of hypertension: Secondary | ICD-10-CM

## 2016-02-24 DIAGNOSIS — Z9889 Other specified postprocedural states: Secondary | ICD-10-CM

## 2016-02-24 DIAGNOSIS — I6521 Occlusion and stenosis of right carotid artery: Secondary | ICD-10-CM | POA: Diagnosis not present

## 2016-02-24 DIAGNOSIS — IMO0001 Reserved for inherently not codable concepts without codable children: Secondary | ICD-10-CM

## 2016-02-24 DIAGNOSIS — I10 Essential (primary) hypertension: Secondary | ICD-10-CM

## 2016-02-24 LAB — VAS US CAROTID
LCCADDIAS: -25 cm/s
LEFT ECA DIAS: -7 cm/s
Left CCA dist sys: -80 cm/s
Left CCA prox dias: 19 cm/s
Left CCA prox sys: 62 cm/s
Left ICA dist dias: -36 cm/s
Left ICA dist sys: -109 cm/s
Left ICA prox dias: -35 cm/s
Left ICA prox sys: -122 cm/s
RCCADSYS: -108 cm/s
RCCAPDIAS: 24 cm/s
RCCAPSYS: 106 cm/s
RIGHT CCA MID DIAS: -26 cm/s
RIGHT ECA DIAS: -9 cm/s

## 2016-02-24 NOTE — Patient Instructions (Signed)
Stroke Prevention Some medical conditions and behaviors are associated with an increased chance of having a stroke. You may prevent a stroke by making healthy choices and managing medical conditions. HOW CAN I REDUCE MY RISK OF HAVING A STROKE?   Stay physically active. Get at least 30 minutes of activity on most or all days.  Do not smoke. It may also be helpful to avoid exposure to secondhand smoke.  Limit alcohol use. Moderate alcohol use is considered to be:  No more than 2 drinks per day for men.  No more than 1 drink per day for nonpregnant women.  Eat healthy foods. This involves:  Eating 5 or more servings of fruits and vegetables a day.  Making dietary changes that address high blood pressure (hypertension), high cholesterol, diabetes, or obesity.  Manage your cholesterol levels.  Making food choices that are high in fiber and low in saturated fat, trans fat, and cholesterol may control cholesterol levels.  Take any prescribed medicines to control cholesterol as directed by your health care provider.  Manage your diabetes.  Controlling your carbohydrate and sugar intake is recommended to manage diabetes.  Take any prescribed medicines to control diabetes as directed by your health care provider.  Control your hypertension.  Making food choices that are low in salt (sodium), saturated fat, trans fat, and cholesterol is recommended to manage hypertension.  Ask your health care provider if you need treatment to lower your blood pressure. Take any prescribed medicines to control hypertension as directed by your health care provider.  If you are 18-39 years of age, have your blood pressure checked every 3-5 years. If you are 40 years of age or older, have your blood pressure checked every year.  Maintain a healthy weight.  Reducing calorie intake and making food choices that are low in sodium, saturated fat, trans fat, and cholesterol are recommended to manage  weight.  Stop drug abuse.  Avoid taking birth control pills.  Talk to your health care provider about the risks of taking birth control pills if you are over 35 years old, smoke, get migraines, or have ever had a blood clot.  Get evaluated for sleep disorders (sleep apnea).  Talk to your health care provider about getting a sleep evaluation if you snore a lot or have excessive sleepiness.  Take medicines only as directed by your health care provider.  For some people, aspirin or blood thinners (anticoagulants) are helpful in reducing the risk of forming abnormal blood clots that can lead to stroke. If you have the irregular heart rhythm of atrial fibrillation, you should be on a blood thinner unless there is a good reason you cannot take them.  Understand all your medicine instructions.  Make sure that other conditions (such as anemia or atherosclerosis) are addressed. SEEK IMMEDIATE MEDICAL CARE IF:   You have sudden weakness or numbness of the face, arm, or leg, especially on one side of the body.  Your face or eyelid droops to one side.  You have sudden confusion.  You have trouble speaking (aphasia) or understanding.  You have sudden trouble seeing in one or both eyes.  You have sudden trouble walking.  You have dizziness.  You have a loss of balance or coordination.  You have a sudden, severe headache with no known cause.  You have new chest pain or an irregular heartbeat. Any of these symptoms may represent a serious problem that is an emergency. Do not wait to see if the symptoms will   go away. Get medical help at once. Call your local emergency services (911 in [place] Do not drive yourself to the hospital.   This information is not intended to replace advice given to you by your health care provider. Make sure you discuss any questions you have with your health care provider.   Document Released: 07/12/2004 Document Revised: 06/25/2014 Document Reviewed:  12/05/2012 Elsevier Interactive Patient Education 2016 Elsevier [employer]  

## 2016-02-24 NOTE — Progress Notes (Signed)
Chief Complaint: Follow up Extracranial Carotid Artery Stenosis   History of Present Illness  Ellen Baker is a 65 y.o. female patient of Dr. Bridgett Larsson who is s/p R CEA (Date: 01/19/13).  She returns today for routine surveillance.  She had a TIA July, 2014, as manifested by transient left side diminished sensation and left hemiparesis, was evaluated at [facility], then had the left CEA.  She denies any further stroke or TIA activity.  She states her blood pressure is usually about 120/70.  The patient denies amaurosis fugax or monocular blindness. The patient denies unilateral facial drooping.  The patient denies receptive or expressive aphasia.  She denies any residual neurological deficits.  Patient denies New Medical or Surgical History. She denies claudication symptoms with walking.  Pt states she has a hx of irregular heart rhythm, states her last echocardiogram was in 2010 for evaluation of syncope, states was normal. Pt states she has not informed her PCP of this hx, I advised her to inform her PCP. Her cardiac rhythm has been regular on my assessments.   Pt Diabetic: No  Pt smoker: non-smoker   Pt meds include:  Statin : Yes  ASA: No  Other anticoagulants/antiplatelets: Plavix, taking since TIA diagnosed   Past Medical History:  Diagnosis Date  . Arthritis    right knee  . Carotid artery occlusion   . GERD (gastroesophageal reflux disease)   . Heart murmur   . Hyperlipidemia   . Hypertension   . Stroke [facility]) 12-31-12   no residual effects    Social History Social History  Substance Use Topics  . Smoking status: Never Smoker  . Smokeless tobacco: Never Used  . Alcohol use No    Family History Family History  Problem Relation Age of Onset  . Stroke Mother   . Deep vein thrombosis Mother   . Heart disease Mother     Amputation-  . Hypertension Mother   . Alzheimer's disease Mother   . Heart failure Father   . Heart disease Father     CF  .  Hypertension Father   . Varicose Veins Father   . Parkinson's disease Father     Surgical History Past Surgical History:  Procedure Laterality Date  . CAROTID ENDARTERECTOMY     RIGHT  01/19/13  . COLONOSCOPY  08-18-12  . ENDARTERECTOMY Right 01/19/2013   Procedure: ENDARTERECTOMY CAROTID with patch angioplasty;  Surgeon: Conrad Angier, MD;  Location: [place];  Service: Vascular;  Laterality: Right;  . ESOPHAGEAL DILATION    . TUBAL LIGATION    . UPPER GI ENDOSCOPY  08-18-12    Allergies  Allergen Reactions  . Adhesive [Tape] Rash    Skin became RAW    Current Outpatient Prescriptions  Medication Sig Dispense Refill  . azithromycin (ZITHROMAX) 250 MG tablet 2 today then 1 a day for 4 days 6 tablet 0  . Biotin w/ Vitamins C & E (HAIR SKIN & NAILS GUMMIES PO) Take 2 each by mouth daily.    . Calcium Carb-Ergocalciferol (CHEWABLE CALCIUM/D PO) Take 1 tablet by mouth daily.    . clopidogrel (PLAVIX) 75 MG tablet TAKE 1 TABLET(75 MG) BY MOUTH DAILY WITH BREAKFAST 30 tablet 6  . loratadine (CLARITIN) 10 MG tablet Take 10 mg by mouth continuous as needed for allergies.    Marland Kitchen losartan (COZAAR) 50 MG tablet TAKE 1 TABLET(50 MG) BY MOUTH DAILY 30 tablet 6  . meclizine (ANTIVERT) 25 MG tablet Take 1 tablet (25  mg total) by mouth 3 (three) times daily as needed for dizziness. 30 tablet 0  . Menthol, Topical Analgesic, (ICY HOT EX) Apply 1 application topically 2 (two) times daily as needed (knee pain).    . mupirocin ointment (BACTROBAN) 2 % Apply three times a day for 5 days. 22 g 0  . Naproxen Sodium (ALEVE PO) Take by mouth continuous as needed.    . Omeprazole (PRILOSEC PO) Take by mouth. Patient is not sure of doseage- 20mg  qday otc    . simvastatin (ZOCOR) 20 MG tablet Take 1 tablet (20 mg total) by mouth daily. 30 tablet 6   No current facility-administered medications for this visit.     Review of Systems : See HPI for pertinent positives and negatives.  Physical Examination  Vitals:    02/24/16 1112 02/24/16 1113  BP: (!) 188/84 (!) 181/82  Pulse: 64   SpO2: 100%   Weight: 200 lb 9.6 oz (91 kg)   Height: 5\' 6"  (1.676 m)    Body mass index is 32.38 kg/m.  GGeneral: WDWN obese female in NAD  GAIT: normal  Eyes: PERRLA  Pulmonary: Respirations are non-labored, CTAB, good air movement in all fields. Cardiac: regular rhythm, +murmur.   VASCULAR EXAM  Carotid Bruits  Left  Right    Negative  Negative    Radial pulses are 2+ palpable and equal.   LE Pulses  LEFT  RIGHT   POPLITEAL  not palpable  not palpable   POSTERIOR TIBIAL  palpable  palpable   DORSALIS PEDIS  ANTERIOR TIBIAL  palpable  palpable    Gastrointestinal: soft, nontender, BS WNL, no r/g, no palpable masses.  Musculoskeletal: Nomuscle atrophy/wasting. M/S 5/5 throughout, Extremities without ischemic changes.  Neurologic: A&O X 3; Appropriate Affect, Speech is normal  CN 2-12 intact, Pain and light touch intact in extremities, Motor exam as listed above.    Assessment: Ellen Baker is a 65 y.o. female who is s/p R CEA (Date: 01/19/13).  She had a TIA July, 2014, as manifested by transient left side diminished sensation and left hemiparesis, was evaluated at [facility], then had the right CEA.  She denies any further stroke or TIA activity.  She takes Plavix and a statin.   She denies headache, denies feeling light headed, denies chest pain, denies dyspnea.  Her blood pressure is elevated now, repeat blood pressure check on left arm with large manual cuff is 190/94. I advised pt to go to her PCP's office now, Dr. Otilio Miu in [place], and ask that office to address her elevated blood pressure.   DATA Today's carotid Duplex suggests a patent right carotid endarterectomy site with no evidence of hyperplasia or restenosis and <40% left ICA stenosis. No significant change in comparison to the last exam on 02/18/15.    Plan: Follow-up in 1 year with Carotid Duplex  scan.   I discussed in depth with the patient the nature of atherosclerosis, and emphasized the importance of maximal medical management including strict control of blood pressure, blood glucose, and lipid levels, obtaining regular exercise, and continued cessation of smoking.  The patient is aware that without maximal medical management the underlying atherosclerotic disease process will progress, limiting the benefit of any interventions. The patient was given information about stroke prevention and what symptoms should prompt the patient to seek immediate medical care. Thank you for allowing [place] to participate in this patient's care.  Vinnie Level Camera Krienke, RN, MSN, FNP-C [facility]:  [phone]  Clinic Physician: Bridgett Larsson  02/24/16 11:28 AM

## 2016-03-30 ENCOUNTER — Encounter: Payer: Self-pay | Admitting: Family

## 2016-04-04 DIAGNOSIS — E739 Lactose intolerance, unspecified: Secondary | ICD-10-CM | POA: Diagnosis not present

## 2016-04-04 DIAGNOSIS — K210 Gastro-esophageal reflux disease with esophagitis: Secondary | ICD-10-CM | POA: Diagnosis not present

## 2016-04-10 DIAGNOSIS — H2513 Age-related nuclear cataract, bilateral: Secondary | ICD-10-CM | POA: Diagnosis not present

## 2016-05-22 NOTE — Addendum Note (Signed)
Addended by: Lianne Cure A on: 05/22/2016 09:59 AM   Modules accepted: Orders

## 2016-05-25 DIAGNOSIS — M25562 Pain in left knee: Secondary | ICD-10-CM | POA: Diagnosis not present

## 2016-05-25 DIAGNOSIS — M1712 Unilateral primary osteoarthritis, left knee: Secondary | ICD-10-CM | POA: Diagnosis not present

## 2016-05-25 DIAGNOSIS — M1711 Unilateral primary osteoarthritis, right knee: Secondary | ICD-10-CM | POA: Diagnosis not present

## 2016-06-05 ENCOUNTER — Ambulatory Visit (INDEPENDENT_AMBULATORY_CARE_PROVIDER_SITE_OTHER): Payer: PPO | Admitting: Family Medicine

## 2016-06-05 ENCOUNTER — Encounter: Payer: Self-pay | Admitting: Family Medicine

## 2016-06-05 VITALS — BP 130/88 | HR 60 | Ht 66.0 in | Wt 204.0 lb

## 2016-06-05 DIAGNOSIS — K219 Gastro-esophageal reflux disease without esophagitis: Secondary | ICD-10-CM

## 2016-06-05 DIAGNOSIS — Z23 Encounter for immunization: Secondary | ICD-10-CM

## 2016-06-05 DIAGNOSIS — I679 Cerebrovascular disease, unspecified: Secondary | ICD-10-CM | POA: Diagnosis not present

## 2016-06-05 DIAGNOSIS — I10 Essential (primary) hypertension: Secondary | ICD-10-CM | POA: Diagnosis not present

## 2016-06-05 DIAGNOSIS — E785 Hyperlipidemia, unspecified: Secondary | ICD-10-CM

## 2016-06-05 MED ORDER — LOSARTAN POTASSIUM 50 MG PO TABS: ORAL_TABLET | ORAL | 6 refills | Status: DC

## 2016-06-05 MED ORDER — CLOPIDOGREL BISULFATE 75 MG PO TABS: ORAL_TABLET | ORAL | 6 refills | Status: DC

## 2016-06-05 MED ORDER — SIMVASTATIN 20 MG PO TABS: 20.0000 mg | ORAL_TABLET | Freq: Every day | ORAL | 6 refills | Status: DC

## 2016-06-05 NOTE — Progress Notes (Signed)
Name: Ellen Baker   MRN: [id number]    DOB: September 17, 1950   Date:06/05/2016       Progress Note  Subjective  Chief Complaint  Chief Complaint  Patient presents with  . Hypertension  . Hyperlipidemia  . Cerebrovascular Accident    takes Plavix    Hypertension  This is a chronic problem. The current episode started more than 1 year ago. The problem has been waxing and waning since onset. The problem is controlled. Pertinent negatives include no anxiety, blurred vision, chest pain, headaches, malaise/fatigue, neck pain, orthopnea, palpitations, peripheral edema, PND, shortness of breath or sweats. There are no associated agents to hypertension. Risk factors for coronary artery disease include dyslipidemia, obesity and post-menopausal state. Past treatments include angiotensin blockers. The current treatment provides moderate improvement. There are no compliance problems.  There is no history of angina, kidney disease, CAD/MI, CVA, heart failure, left ventricular hypertrophy, PVD or retinopathy. There is no history of chronic renal disease, a hypertension causing med or pheochromocytoma.  Hyperlipidemia  This is a chronic problem. The current episode started more than 1 year ago. The problem is controlled. Recent lipid tests were reviewed and are normal. Exacerbating diseases include obesity. She has no history of chronic renal disease, diabetes, hypothyroidism, liver disease or nephrotic syndrome. Pertinent negatives include no chest pain, focal sensory loss, focal weakness, leg pain, myalgias or shortness of breath. Current antihyperlipidemic treatment includes statins. The current treatment provides moderate improvement of lipids. There are no compliance problems.   Cerebrovascular Accident  This is a chronic problem. The current episode started more than 1 year ago. The problem occurs intermittently. The problem has been gradually improving. Pertinent negatives include no abdominal pain, anorexia,  arthralgias, change in bowel habit, chest pain, chills, congestion, coughing, diaphoresis, fatigue, fever, headaches, joint swelling, myalgias, nausea, neck pain, numbness, rash, sore throat, swollen glands, urinary symptoms, vertigo, visual change, vomiting or weakness. Nothing aggravates the symptoms. She has tried nothing for the symptoms. The treatment provided moderate relief.    No problem-specific Assessment & Plan notes found for this encounter.   Past Medical History:  Diagnosis Date  . Arthritis    right knee  . Carotid artery occlusion   . GERD (gastroesophageal reflux disease)   . Heart murmur   . Hyperlipidemia   . Hypertension   . Stroke [employer]) 12-31-12   no residual effects    Past Surgical History:  Procedure Laterality Date  . CAROTID ENDARTERECTOMY     RIGHT  01/19/13  . COLONOSCOPY  08-18-12  . ENDARTERECTOMY Right 01/19/2013   Procedure: ENDARTERECTOMY CAROTID with patch angioplasty;  Surgeon: Conrad Baxter, MD;  Location: [place];  Service: Vascular;  Laterality: Right;  . ESOPHAGEAL DILATION    . TUBAL LIGATION    . UPPER GI ENDOSCOPY  08-18-12    Family History  Problem Relation Age of Onset  . Stroke Mother   . Deep vein thrombosis Mother   . Heart disease Mother     Amputation-  . Hypertension Mother   . Alzheimer's disease Mother   . Heart failure Father   . Heart disease Father     CF  . Hypertension Father   . Varicose Veins Father   . Parkinson's disease Father     Social History   Social History  . Marital status: Widowed    Spouse name: N/A  . Number of children: N/A  . Years of education: N/A   Occupational History  . Not  on file.   Social History Main Topics  . Smoking status: Never Smoker  . Smokeless tobacco: Never Used  . Alcohol use No  . Drug use: No  . Sexual activity: No   Other Topics Concern  . Not on file   Social History Narrative  . No narrative on file    Allergies  Allergen Reactions  . Adhesive [Tape] Rash     Skin became RAW     Review of Systems  Constitutional: Negative for chills, diaphoresis, fatigue, fever, malaise/fatigue and weight loss.  HENT: Negative for congestion, ear discharge, ear pain and sore throat.   Eyes: Negative for blurred vision.  Respiratory: Negative for cough, sputum production, shortness of breath and wheezing.   Cardiovascular: Negative for chest pain, palpitations, orthopnea, leg swelling and PND.  Gastrointestinal: Negative for abdominal pain, anorexia, blood in stool, change in bowel habit, constipation, diarrhea, heartburn, melena, nausea and vomiting.  Genitourinary: Negative for dysuria, frequency, hematuria and urgency.  Musculoskeletal: Negative for arthralgias, back pain, joint pain, joint swelling, myalgias and neck pain.  Skin: Negative for rash.  Neurological: Negative for dizziness, vertigo, tingling, sensory change, focal weakness, weakness, numbness and headaches.  Endo/Heme/Allergies: Negative for environmental allergies and polydipsia. Does not bruise/bleed easily.  Psychiatric/Behavioral: Negative for depression and suicidal ideas. The patient is not nervous/anxious and does not have insomnia.      Objective  Vitals:   06/05/16 0810  BP: 130/88  Pulse: 60  Weight: 204 lb (92.5 kg)  Height: 5\' 6"  (1.676 m)    Physical Exam  Constitutional: She is well-developed, well-nourished, and in no distress. No distress.  HENT:  Head: Normocephalic and atraumatic.  Right Ear: External ear normal.  Left Ear: External ear normal.  Nose: Nose normal.  Mouth/Throat: Oropharynx is clear and moist.  Eyes: Conjunctivae and EOM are normal. Pupils are equal, round, and reactive to light. Right eye exhibits no discharge. Left eye exhibits no discharge.  Neck: Normal range of motion. Neck supple. No JVD present. No thyromegaly present.  Cardiovascular: Normal rate, regular rhythm, normal heart sounds and intact distal pulses.  Exam reveals no gallop and  no friction rub.   No murmur heard. Pulmonary/Chest: Effort normal and breath sounds normal. She has no wheezes. She has no rales.  Abdominal: Soft. Bowel sounds are normal. She exhibits no mass. There is no tenderness. There is no guarding.  Musculoskeletal: Normal range of motion. She exhibits no edema.  Lymphadenopathy:    She has no cervical adenopathy.  Neurological: She is alert. She has normal motor skills, normal sensation, normal strength, normal reflexes and intact cranial nerves.  Skin: Skin is warm and dry. She is not diaphoretic.  Psychiatric: Mood and affect normal.  Nursing note and vitals reviewed.     Assessment & Plan  Problem List Items Addressed This Visit      Cardiovascular and Mediastinum   HTN (hypertension) - Primary   Relevant Medications   simvastatin (ZOCOR) 20 MG tablet   losartan (COZAAR) 50 MG tablet   Other Relevant Orders   Renal Function Panel   Cerebral vascular insufficiency   Relevant Medications   clopidogrel (PLAVIX) 75 MG tablet   simvastatin (ZOCOR) 20 MG tablet   losartan (COZAAR) 50 MG tablet     Digestive   GERD (gastroesophageal reflux disease)     Other   Dyslipidemia   Relevant Medications   simvastatin (ZOCOR) 20 MG tablet   Other Relevant Orders   Lipid Profile  Other Visit Diagnoses    Cerebral vascular disease       Relevant Medications   simvastatin (ZOCOR) 20 MG tablet   losartan (COZAAR) 50 MG tablet   Need for pneumococcal vaccination       Relevant Orders   Pneumococcal conjugate vaccine 13-valent (Completed)        Dr. Macon [facility]  06/05/16

## 2016-06-06 LAB — LIPID PANEL
Chol/HDL Ratio: 2.7 ratio units (ref 0.0–4.4)
Cholesterol, Total: 180 mg/dL (ref 100–199)
HDL: 66 mg/dL (ref >39)
LDL CALC: 95 mg/dL (ref 0–99)
Triglycerides: 94 mg/dL (ref 0–149)
VLDL CHOLESTEROL CAL: 19 mg/dL (ref 5–40)

## 2016-06-06 LAB — RENAL FUNCTION PANEL
Albumin: 4.1 g/dL (ref 3.6–4.8)
BUN / CREAT RATIO: 13 (ref 12–28)
BUN: 9 mg/dL (ref 8–27)
CHLORIDE: 101 mmol/L (ref 96–106)
CO2: 26 mmol/L (ref 18–29)
Calcium: 9.1 mg/dL (ref 8.7–10.3)
Creatinine, Ser: 0.71 mg/dL (ref 0.57–1.00)
GFR calc non Af Amer: 90 mL/min/{1.73_m2} (ref >59)
GFR, EST AFRICAN AMERICAN: 103 mL/min/{1.73_m2} (ref >59)
Glucose: 93 mg/dL (ref 65–99)
Phosphorus: 3.8 mg/dL (ref 2.5–4.5)
Potassium: 4.2 mmol/L (ref 3.5–5.2)
Sodium: 143 mmol/L (ref 134–144)

## 2016-08-29 ENCOUNTER — Ambulatory Visit: Payer: BLUE CROSS/BLUE SHIELD | Admitting: Family

## 2016-08-29 ENCOUNTER — Encounter (HOSPITAL_COMMUNITY): Payer: BLUE CROSS/BLUE SHIELD

## 2016-09-24 DIAGNOSIS — M170 Bilateral primary osteoarthritis of knee: Secondary | ICD-10-CM | POA: Diagnosis not present

## 2016-10-01 DIAGNOSIS — M170 Bilateral primary osteoarthritis of knee: Secondary | ICD-10-CM | POA: Diagnosis not present

## 2016-10-08 DIAGNOSIS — M170 Bilateral primary osteoarthritis of knee: Secondary | ICD-10-CM | POA: Diagnosis not present

## 2016-11-21 ENCOUNTER — Encounter: Payer: Self-pay | Admitting: Family

## 2016-11-27 ENCOUNTER — Ambulatory Visit (INDEPENDENT_AMBULATORY_CARE_PROVIDER_SITE_OTHER): Payer: PPO | Admitting: Family

## 2016-11-27 ENCOUNTER — Ambulatory Visit (HOSPITAL_COMMUNITY)
Admission: RE | Admit: 2016-11-27 | Discharge: 2016-11-27 | Disposition: A | Discharge status: Home / Self Care | Payer: PPO | Source: Ambulatory Visit | Attending: Family | Admitting: Family

## 2016-11-27 ENCOUNTER — Encounter: Payer: Self-pay | Admitting: Family

## 2016-11-27 VITALS — BP 135/90 | HR 65 | Temp 97.3°F | Resp 20 | Ht 66.0 in | Wt 208.0 lb

## 2016-11-27 DIAGNOSIS — I6521 Occlusion and stenosis of right carotid artery: Secondary | ICD-10-CM

## 2016-11-27 DIAGNOSIS — I6522 Occlusion and stenosis of left carotid artery: Secondary | ICD-10-CM | POA: Insufficient documentation

## 2016-11-27 DIAGNOSIS — Z9889 Other specified postprocedural states: Secondary | ICD-10-CM | POA: Diagnosis not present

## 2016-11-27 NOTE — Progress Notes (Signed)
Chief Complaint: Follow up Extracranial Carotid Artery Stenosis   History of Present Illness  Ellen Baker is a 66 y.o. female patient of Dr. Bridgett Larsson who is s/p R CEA (Date: 01/19/13).  She returns today for routine surveillance.  She had a TIA July, 2014, as manifested by transient left side diminished sensation and left hemiparesis, was evaluated at [facility], then had the left CEA.  She denies any further stroke or TIA activity.  She states her blood pressure is usually about 120/70.  The patient denies amaurosis fugax or monocular blindness. The patient denies unilateral facial drooping.  The patient denies receptive or expressive aphasia.  She denies any residual neurological deficits.   She denies claudication symptoms with walking.  She had Synvisc injected on both knees in April 2018, states her knees are feeling better.   Pt states she has a hx of irregular heart rhythm, states her last echocardiogram was in 2010 for evaluation of syncope, states was normal. Pt states she has not informed her PCP of this hx, I advised her to inform her PCP. Her cardiac rhythm has been regular on my assessments.   Pt Diabetic: No Pt smoker: non-smoker  Pt meds include:  Statin : Yes  ASA: No  Other anticoagulants/antiplatelets: Plavix, taking since TIA diagnosed   Past Medical History:  Diagnosis Date  . Arthritis    right knee  . Carotid artery occlusion   . GERD (gastroesophageal reflux disease)   . Heart murmur   . Hyperlipidemia   . Hypertension   . Stroke [facility]) 12-31-12   no residual effects    Social History Social History  Substance Use Topics  . Smoking status: Never Smoker  . Smokeless tobacco: Never Used  . Alcohol use No    Family History Family History  Problem Relation Age of Onset  . Stroke Mother   . Deep vein thrombosis Mother   . Heart disease Mother        Amputation-  . Hypertension Mother   . Alzheimer's disease Mother   . Heart failure  Father   . Heart disease Father        CF  . Hypertension Father   . Varicose Veins Father   . Parkinson's disease Father     Surgical History Past Surgical History:  Procedure Laterality Date  . CAROTID ENDARTERECTOMY     RIGHT  01/19/13  . COLONOSCOPY  08-18-12  . ENDARTERECTOMY Right 01/19/2013   Procedure: ENDARTERECTOMY CAROTID with patch angioplasty;  Surgeon: Conrad Snook, MD;  Location: [place];  Service: Vascular;  Laterality: Right;  . ESOPHAGEAL DILATION    . TUBAL LIGATION    . UPPER GI ENDOSCOPY  08-18-12    Allergies  Allergen Reactions  . Adhesive [Tape] Rash    Skin became RAW    Current Outpatient Prescriptions  Medication Sig Dispense Refill  . Biotin w/ Vitamins C & E (HAIR SKIN & NAILS GUMMIES PO) Take 2 each by mouth daily.    . Calcium Carb-Ergocalciferol (CHEWABLE CALCIUM/D PO) Take 1 tablet by mouth daily.    . clopidogrel (PLAVIX) 75 MG tablet TAKE 1 TABLET(75 MG) BY MOUTH DAILY WITH BREAKFAST 30 tablet 6  . loratadine (CLARITIN) 10 MG tablet Take 10 mg by mouth continuous as needed for allergies.    Marland Kitchen losartan (COZAAR) 50 MG tablet TAKE 1 TABLET(50 MG) BY MOUTH DAILY 30 tablet 6  . meclizine (ANTIVERT) 25 MG tablet Take 1 tablet (25 mg total) by  mouth 3 (three) times daily as needed for dizziness. 30 tablet 0  . Naproxen Sodium (ALEVE PO) Take by mouth continuous as needed.    . Omeprazole (PRILOSEC PO) Take by mouth. Patient is not sure of doseage- 20mg  qday otc    . simvastatin (ZOCOR) 20 MG tablet Take 1 tablet (20 mg total) by mouth daily. 30 tablet 6  . mupirocin ointment (BACTROBAN) 2 % Apply three times a day for 5 days. (Patient not taking: Reported on 11/27/2016) 22 g 0   No current facility-administered medications for this visit.     Review of Systems : See HPI for pertinent positives and negatives.  Physical Examination  Vitals:   11/27/16 1052 11/27/16 1108  BP: 140/89 135/90  Pulse: 65   Resp: 20   Temp: 97.3 F (36.3 C)   TempSrc:  Oral   SpO2: 99%   Weight: 208 lb (94.3 kg)   Height: 5\' 6"  (1.676 m)    Body mass index is 33.57 kg/m.  General: WDWN obese female in NAD  GAIT:normal  Eyes: PERRLA  Pulmonary: Respirations are non-labored, CTAB, good air movement in all fields. Cardiac: regular rhythm, +murmur.   VASCULAR EXAM Carotid Bruits Left Right   Negative  Negative    Radial pulses are 2+ palpable and equal.   LE Pulses  LEFT  RIGHT   POPLITEAL  not palpable  not palpable  POSTERIOR TIBIAL  palpable  palpable   DORSALIS PEDIS ANTERIOR TIBIAL  palpable  palpable    Gastrointestinal: soft, nontender, BS WNL, no r/g, no palpable masses.  Musculoskeletal: Nomuscle atrophy/wasting. M/S 5/5 throughout, Extremities without ischemic changes.  Neurologic: A&O X 3; Appropriate Affect, Speech is normal  CN 2-12 intact, Pain and light touch intact in extremities, Motor exam as listed above.    Assessment: Ellen Baker is a 66 y.o. female who is s/p R CEA (Date: 01/19/13).  She had a TIA July, 2014, as manifested by transient left side diminished sensation and left hemiparesis, was evaluated at [facility], then had the right CEA.  She denies any further stroke or TIA activity.  Fortunately she does not carry a diagnosis of DM and has never used tobacco.  She takes Plavix and a statin.    DATA Carotid Duplex (11/27/16): Patent right carotid endarterectomy site with no evidence of hyperplasia or restenosis, and 40-59% left ICA stenosis. Bilateral vertebral artery flow is antegrade.  Bilateral subclavian artery waveforms are normal.  Increased stenosis in the left ICA compared to the last exam on 02-24-16.    Plan: Follow-up in 1 year with Carotid Duplex scan.    I discussed in depth with the patient the nature of atherosclerosis, and emphasized the importance of maximal medical management including strict control of blood pressure, blood glucose, and lipid levels,  obtaining regular exercise, and continued cessation of smoking.  The patient is aware that without maximal medical management the underlying atherosclerotic disease process will progress, limiting the benefit of any interventions. The patient was given information about stroke prevention and what symptoms should prompt the patient to seek immediate medical care. Thank you for allowing [place] to participate in this patient's care.  Clemon Chambers, RN, MSN, FNP-C [facility] [place] Office: [phone]  Clinic Physician: Early  11/27/16 11:09 AM

## 2016-11-27 NOTE — Patient Instructions (Signed)
Stroke Prevention Some medical conditions and behaviors are associated with an increased chance of having a stroke. You may prevent a stroke by making healthy choices and managing medical conditions. How can I reduce my risk of having a stroke?  Stay physically active. Get at least 30 minutes of activity on most or all days.  Do not smoke. It may also be helpful to avoid exposure to secondhand smoke.  Limit alcohol use. Moderate alcohol use is considered to be: ? No more than 2 drinks per day for men. ? No more than 1 drink per day for nonpregnant women.  Eat healthy foods. This involves: ? Eating 5 or more servings of fruits and vegetables a day. ? Making dietary changes that address high blood pressure (hypertension), high cholesterol, diabetes, or obesity.  Manage your cholesterol levels. ? Making food choices that are high in fiber and low in saturated fat, trans fat, and cholesterol may control cholesterol levels. ? Take any prescribed medicines to control cholesterol as directed by your health care provider.  Manage your diabetes. ? Controlling your carbohydrate and sugar intake is recommended to manage diabetes. ? Take any prescribed medicines to control diabetes as directed by your health care provider.  Control your hypertension. ? Making food choices that are low in salt (sodium), saturated fat, trans fat, and cholesterol is recommended to manage hypertension. ? Ask your health care provider if you need treatment to lower your blood pressure. Take any prescribed medicines to control hypertension as directed by your health care provider. ? If you are 18-39 years of age, have your blood pressure checked every 3-5 years. If you are 40 years of age or older, have your blood pressure checked every year.  Maintain a healthy weight. ? Reducing calorie intake and making food choices that are low in sodium, saturated fat, trans fat, and cholesterol are recommended to manage  weight.  Stop drug abuse.  Avoid taking birth control pills. ? Talk to your health care provider about the risks of taking birth control pills if you are over 35 years old, smoke, get migraines, or have ever had a blood clot.  Get evaluated for sleep disorders (sleep apnea). ? Talk to your health care provider about getting a sleep evaluation if you snore a lot or have excessive sleepiness.  Take medicines only as directed by your health care provider. ? For some people, aspirin or blood thinners (anticoagulants) are helpful in reducing the risk of forming abnormal blood clots that can lead to stroke. If you have the irregular heart rhythm of atrial fibrillation, you should be on a blood thinner unless there is a good reason you cannot take them. ? Understand all your medicine instructions.  Make sure that other conditions (such as anemia or atherosclerosis) are addressed. Get help right away if:  You have sudden weakness or numbness of the face, arm, or leg, especially on one side of the body.  Your face or eyelid droops to one side.  You have sudden confusion.  You have trouble speaking (aphasia) or understanding.  You have sudden trouble seeing in one or both eyes.  You have sudden trouble walking.  You have dizziness.  You have a loss of balance or coordination.  You have a sudden, severe headache with no known cause.  You have new chest pain or an irregular heartbeat. Any of these symptoms may represent a serious problem that is an emergency. Do not wait to see if the symptoms will go away.   Get medical help at once. Call your local emergency services (911 in [place] Do not drive yourself to the hospital. This information is not intended to replace advice given to you by your health care provider. Make sure you discuss any questions you have with your health care provider. Document Released: 07/12/2004 Document Revised: 11/10/2015 Document Reviewed: 12/05/2012 Elsevier  Interactive Patient Education  2017 Elsevier Inc.     Preventing Cerebrovascular Disease Arteries are blood vessels that carry blood that contains oxygen from the heart to all parts of the body. Cerebrovascular disease affects arteries that supply the brain. Any condition that blocks or disrupts blood flow to the brain can cause cerebrovascular disease. Brain cells that lose blood supply start to die within minutes (stroke). Stroke is the main danger of cerebrovascular disease. Atherosclerosis and high blood pressure are common causes of cerebrovascular disease. Atherosclerosis is narrowing and hardening of an artery that results when fat, cholesterol, calcium, or other substances (plaque) build up inside an artery. Plaque reduces blood flow through the artery. High blood pressure increases the risk of bleeding inside the brain. Making diet and lifestyle changes to prevent atherosclerosis and high blood pressure lowers your risk of cerebrovascular disease. What nutrition changes can be made?  Eat more fruits, vegetables, and whole grains.  Reduce how much saturated fat you eat. To do this, eat less red meat and fewer full-fat dairy products.  Eat healthy proteins instead of red meat. Healthy proteins include: ? Fish. Eat fish that contains heart-healthy omega-3 fatty acids, twice a week. Examples include salmon, albacore tuna, mackerel, and herring. ? Chicken. ? Nuts. ? Low-fat or nonfat yogurt.  Avoid processed meats, like bacon and lunchmeat.  Avoid foods that contain: ? A lot of sugar, such as sweets and drinks with added sugar. ? A lot of salt (sodium). Avoid adding extra salt to your food, as told by your health care provider. ? Trans fats, such as margarine and baked goods. Trans fats may be listed as "partially hydrogenated oils" on food labels.  Check food labels to see how much sodium, sugar, and trans fats are in foods.  Use vegetable oils that contain low amounts of  saturated fat, such as olive oil or canola oil. What lifestyle changes can be made?  Drink alcohol in moderation. This means no more than 1 drink a day for nonpregnant women and 2 drinks a day for men. One drink equals 12 oz of beer, 5 oz of wine, or 1 oz of hard liquor.  If you are overweight, ask your health care provider to recommend a weight-loss plan for you. Losing 5-10 lb (2.2-4.5 kg) can reduce your risk of diabetes, atherosclerosis, and high blood pressure.  Exercise for 30?60 minutes on most days, or as much as told by your health care provider. ? Do moderate-intensity exercise, such as brisk walking, bicycling, and water aerobics. Ask your health care provider which activities are safe for you.  Do not use any products that contain nicotine or tobacco, such as cigarettes and e-cigarettes. If you need help quitting, ask your health care provider. Why are these changes important? Making these changes lowers your risk of many diseases that can cause cerebrovascular disease and stroke. Stroke is a leading cause of death and disability. Making these changes also improves your overall health and quality of life. What can I do to lower my risk? The following factors make you more likely to develop cerebrovascular disease:  Being overweight.  Smoking.  Being physically inactive.    Eating a high-fat diet.  Having certain health conditions, such as: ? Diabetes. ? High blood pressure. ? Heart disease. ? Atherosclerosis. ? High cholesterol. ? Sickle cell disease.  Talk with your health care provider about your risk for cerebrovascular disease. Work with your health care provider to control diseases that you have that may contribute to cerebrovascular disease. Your health care provider may prescribe medicines to help prevent major causes of cerebrovascular disease. Where to find more information: Learn more about preventing cerebrovascular disease from:  National Heart, Lung, and  Blood Institute: www.nhlbi.nih.gov/health/health-topics/topics/stroke  Centers for Disease Control and Prevention: cdc.gov/stroke/about.htm  Summary  Cerebrovascular disease can lead to a stroke.  Atherosclerosis and high blood pressure are major causes of cerebrovascular disease.  Making diet and lifestyle changes can reduce your risk of cerebrovascular disease.  Work with your health care provider to get your risk factors under control to reduce your risk of cerebrovascular disease. This information is not intended to replace advice given to you by your health care provider. Make sure you discuss any questions you have with your health care provider. Document Released: 06/19/2015 Document Revised: 12/23/2015 Document Reviewed: 06/19/2015 Elsevier Interactive Patient Education  2018 Elsevier Inc.  

## 2016-12-04 ENCOUNTER — Ambulatory Visit (INDEPENDENT_AMBULATORY_CARE_PROVIDER_SITE_OTHER): Payer: PPO | Admitting: Family Medicine

## 2016-12-04 ENCOUNTER — Encounter: Payer: Self-pay | Admitting: Family Medicine

## 2016-12-04 VITALS — BP 112/70 | HR 66 | Ht 66.0 in | Wt 208.0 lb

## 2016-12-04 DIAGNOSIS — I499 Cardiac arrhythmia, unspecified: Secondary | ICD-10-CM

## 2016-12-04 DIAGNOSIS — I679 Cerebrovascular disease, unspecified: Secondary | ICD-10-CM | POA: Diagnosis not present

## 2016-12-04 DIAGNOSIS — I10 Essential (primary) hypertension: Secondary | ICD-10-CM

## 2016-12-04 DIAGNOSIS — E785 Hyperlipidemia, unspecified: Secondary | ICD-10-CM | POA: Diagnosis not present

## 2016-12-04 MED ORDER — LOSARTAN POTASSIUM 50 MG PO TABS: ORAL_TABLET | ORAL | 1 refills | Status: DC

## 2016-12-04 MED ORDER — CLOPIDOGREL BISULFATE 75 MG PO TABS: ORAL_TABLET | ORAL | 1 refills | Status: DC

## 2016-12-04 MED ORDER — SIMVASTATIN 20 MG PO TABS: 20.0000 mg | ORAL_TABLET | Freq: Every day | ORAL | 1 refills | Status: DC

## 2016-12-04 NOTE — Progress Notes (Signed)
Name: Ellen Baker   MRN: [id number]    DOB: Oct 29, 1950   Date:12/04/2016       Progress Note  Subjective  Chief Complaint  Chief Complaint  Patient presents with  . Hypertension  . Hyperlipidemia  . Cerebrovascular Accident    takes plavix    Hypertension  This is a chronic problem. The current episode started more than 1 year ago. The problem is unchanged. The problem is controlled. Pertinent negatives include no anxiety, blurred vision, chest pain, headaches, malaise/fatigue, neck pain, orthopnea, palpitations, peripheral edema, PND, shortness of breath or sweats. There are no associated agents to hypertension. Risk factors for coronary artery disease include obesity. Past treatments include angiotensin blockers. The current treatment provides moderate improvement. There is no history of angina, kidney disease, CAD/MI, CVA, heart failure, left ventricular hypertrophy, PVD or retinopathy. There is no history of chronic renal disease, a hypertension causing med or renovascular disease.  Hyperlipidemia  This is a chronic problem. The problem is controlled. Recent lipid tests were reviewed and are normal. She has no history of chronic renal disease. Pertinent negatives include no chest pain, focal sensory loss, focal weakness, myalgias or shortness of breath. Current antihyperlipidemic treatment includes statins. The current treatment provides moderate improvement of lipids. There are no compliance problems.  Risk factors for coronary artery disease include hypertension and dyslipidemia.  Neurologic Problem  The patient's pertinent negatives include no focal sensory loss or focal weakness. Primary symptoms comment: cva 2014. The current episode started more than 1 year ago. The neurological problem developed insidiously. The problem has been gradually improving since onset. There was left-sided, upper extremity and lower extremity focality noted. Pertinent negatives include no abdominal pain, back  pain, chest pain, dizziness, fever, headaches, light-headedness, nausea, neck pain, palpitations or shortness of breath. Past treatments include medication (plavix). The treatment provided moderate relief.    No problem-specific Assessment & Plan notes found for this encounter.   Past Medical History:  Diagnosis Date  . Arthritis    right knee  . Carotid artery occlusion   . GERD (gastroesophageal reflux disease)   . Heart murmur   . Hyperlipidemia   . Hypertension   . Stroke [facility]) 12-31-12   no residual effects    Past Surgical History:  Procedure Laterality Date  . CAROTID ENDARTERECTOMY     RIGHT  01/19/13  . COLONOSCOPY  08-18-12  . ENDARTERECTOMY Right 01/19/2013   Procedure: ENDARTERECTOMY CAROTID with patch angioplasty;  Surgeon: Conrad Arapaho, MD;  Location: [place];  Service: Vascular;  Laterality: Right;  . ESOPHAGEAL DILATION    . TUBAL LIGATION    . UPPER GI ENDOSCOPY  08-18-12    Family History  Problem Relation Age of Onset  . Stroke Mother   . Deep vein thrombosis Mother   . Heart disease Mother        Amputation-  . Hypertension Mother   . Alzheimer's disease Mother   . Heart failure Father   . Heart disease Father        CF  . Hypertension Father   . Varicose Veins Father   . Parkinson's disease Father     Social History   Social History  . Marital status: Widowed    Spouse name: N/A  . Number of children: N/A  . Years of education: N/A   Occupational History  . Not on file.   Social History Main Topics  . Smoking status: Never Smoker  . Smokeless tobacco: Never Used  .  Alcohol use No  . Drug use: No  . Sexual activity: No   Other Topics Concern  . Not on file   Social History Narrative  . No narrative on file    Allergies  Allergen Reactions  . Adhesive [Tape] Rash    Skin became RAW    Outpatient Medications Prior to Visit  Medication Sig Dispense Refill  . Biotin w/ Vitamins C & E (HAIR SKIN & NAILS GUMMIES PO) Take 2 each by  mouth daily.    . Calcium Carb-Ergocalciferol (CHEWABLE CALCIUM/D PO) Take 1 tablet by mouth daily.    Marland Kitchen loratadine (CLARITIN) 10 MG tablet Take 10 mg by mouth continuous as needed for allergies.    Marland Kitchen meclizine (ANTIVERT) 25 MG tablet Take 1 tablet (25 mg total) by mouth 3 (three) times daily as needed for dizziness. 30 tablet 0  . Naproxen Sodium (ALEVE PO) Take by mouth continuous as needed.    . Omeprazole (PRILOSEC PO) Take by mouth. Patient is not sure of doseage- 20mg  qday otc    . clopidogrel (PLAVIX) 75 MG tablet TAKE 1 TABLET(75 MG) BY MOUTH DAILY WITH BREAKFAST 30 tablet 6  . losartan (COZAAR) 50 MG tablet TAKE 1 TABLET(50 MG) BY MOUTH DAILY 30 tablet 6  . simvastatin (ZOCOR) 20 MG tablet Take 1 tablet (20 mg total) by mouth daily. 30 tablet 6  . mupirocin ointment (BACTROBAN) 2 % Apply three times a day for 5 days. (Patient not taking: Reported on 11/27/2016) 22 g 0   No facility-administered medications prior to visit.     Review of Systems  Constitutional: Negative for chills, fever, malaise/fatigue and weight loss.  HENT: Negative for ear discharge, ear pain and sore throat.   Eyes: Negative for blurred vision.  Respiratory: Negative for cough, sputum production, shortness of breath and wheezing.   Cardiovascular: Negative for chest pain, palpitations, orthopnea, leg swelling and PND.  Gastrointestinal: Negative for abdominal pain, blood in stool, constipation, diarrhea, heartburn, melena and nausea.  Genitourinary: Negative for dysuria, frequency, hematuria and urgency.  Musculoskeletal: Negative for back pain, joint pain, myalgias and neck pain.  Skin: Negative for rash.  Neurological: Negative for dizziness, tingling, sensory change, focal weakness, light-headedness and headaches.  Endo/Heme/Allergies: Negative for environmental allergies and polydipsia. Does not bruise/bleed easily.  Psychiatric/Behavioral: Negative for depression and suicidal ideas. The patient is not  nervous/anxious and does not have insomnia.      Objective  Vitals:   12/04/16 0811  BP: 112/70  Pulse: 66  Weight: 208 lb (94.3 kg)  Height: 5\' 6"  (1.676 m)    Physical Exam  Constitutional: She is well-developed, well-nourished, and in no distress. No distress.  HENT:  Head: Normocephalic and atraumatic.  Right Ear: External ear normal.  Left Ear: External ear normal.  Nose: Nose normal.  Mouth/Throat: Oropharynx is clear and moist.  Eyes: Conjunctivae and EOM are normal. Pupils are equal, round, and reactive to light. Right eye exhibits no discharge. Left eye exhibits no discharge.  Neck: Normal range of motion. Neck supple. No JVD present. No thyromegaly present.  Cardiovascular: Normal rate, normal heart sounds and intact distal pulses.  Exam reveals no gallop and no friction rub.   No murmur heard. Pulmonary/Chest: Effort normal and breath sounds normal. She has no wheezes. She has no rales.  Abdominal: Soft. Bowel sounds are normal. She exhibits no mass. There is no tenderness. There is no guarding.  Musculoskeletal: Normal range of motion. She exhibits no edema.  Lymphadenopathy:  She has no cervical adenopathy.  Neurological: She is alert. She has normal reflexes.  Skin: Skin is warm and dry. She is not diaphoretic.  Psychiatric: Mood and affect normal.  Nursing note and vitals reviewed.     Assessment & Plan  Problem List Items Addressed This Visit      Cardiovascular and Mediastinum   HTN (hypertension) - Primary   Relevant Medications   losartan (COZAAR) 50 MG tablet   simvastatin (ZOCOR) 20 MG tablet   Other Relevant Orders   Renal Function Panel   Cerebral vascular insufficiency   Relevant Medications   losartan (COZAAR) 50 MG tablet   simvastatin (ZOCOR) 20 MG tablet   clopidogrel (PLAVIX) 75 MG tablet     Other   Dyslipidemia   Relevant Medications   simvastatin (ZOCOR) 20 MG tablet   Other Relevant Orders   Lipid Profile    Other Visit  Diagnoses    Irregularly irregular pulse rhythm       Relevant Orders   EKG 12-Lead (Completed)      Meds ordered this encounter  Medications  . losartan (COZAAR) 50 MG tablet    Sig: TAKE 1 TABLET(50 MG) BY MOUTH DAILY    Dispense:  90 tablet    Refill:  1    Needs refill appt  . simvastatin (ZOCOR) 20 MG tablet    Sig: Take 1 tablet (20 mg total) by mouth daily.    Dispense:  90 tablet    Refill:  1    Needs refill appt  . clopidogrel (PLAVIX) 75 MG tablet    Sig: TAKE 1 TABLET(75 MG) BY MOUTH DAILY WITH BREAKFAST    Dispense:  90 tablet    Refill:  1    Needs refill appt      Dr. Otilio Miu [facility]  12/04/16

## 2016-12-05 LAB — RENAL FUNCTION PANEL
Albumin: 3.9 g/dL (ref 3.6–4.8)
BUN/Creatinine Ratio: 12 (ref 12–28)
BUN: 9 mg/dL (ref 8–27)
CHLORIDE: 102 mmol/L (ref 96–106)
CO2: 24 mmol/L (ref 20–29)
Calcium: 9.3 mg/dL (ref 8.7–10.3)
Creatinine, Ser: 0.75 mg/dL (ref 0.57–1.00)
GFR, EST AFRICAN AMERICAN: 97 mL/min/{1.73_m2} (ref >59)
GFR, EST NON AFRICAN AMERICAN: 84 mL/min/{1.73_m2} (ref >59)
GLUCOSE: 93 mg/dL (ref 65–99)
PHOSPHORUS: 3.8 mg/dL (ref 2.5–4.5)
POTASSIUM: 4.4 mmol/L (ref 3.5–5.2)
SODIUM: 142 mmol/L (ref 134–144)

## 2016-12-05 LAB — LIPID PANEL
CHOL/HDL RATIO: 2.6 ratio (ref 0.0–4.4)
Cholesterol, Total: 162 mg/dL (ref 100–199)
HDL: 62 mg/dL (ref >39)
LDL Calculated: 77 mg/dL (ref 0–99)
TRIGLYCERIDES: 113 mg/dL (ref 0–149)
VLDL Cholesterol Cal: 23 mg/dL (ref 5–40)

## 2017-01-18 ENCOUNTER — Other Ambulatory Visit: Payer: Self-pay | Admitting: Orthopedic Surgery

## 2017-01-18 DIAGNOSIS — M2392 Unspecified internal derangement of left knee: Secondary | ICD-10-CM

## 2017-01-18 DIAGNOSIS — E669 Obesity, unspecified: Secondary | ICD-10-CM | POA: Insufficient documentation

## 2017-01-18 DIAGNOSIS — M25562 Pain in left knee: Secondary | ICD-10-CM | POA: Diagnosis not present

## 2017-01-18 DIAGNOSIS — M1712 Unilateral primary osteoarthritis, left knee: Secondary | ICD-10-CM

## 2017-01-18 DIAGNOSIS — M1711 Unilateral primary osteoarthritis, right knee: Secondary | ICD-10-CM | POA: Diagnosis not present

## 2017-01-18 DIAGNOSIS — G8929 Other chronic pain: Secondary | ICD-10-CM

## 2017-01-24 ENCOUNTER — Ambulatory Visit
Admission: RE | Admit: 2017-01-24 | Discharge: 2017-01-24 | Disposition: A | Discharge status: Home / Self Care | Payer: PPO | Source: Ambulatory Visit | Attending: Orthopedic Surgery | Admitting: Orthopedic Surgery

## 2017-01-24 DIAGNOSIS — M25562 Pain in left knee: Secondary | ICD-10-CM | POA: Diagnosis not present

## 2017-01-24 DIAGNOSIS — G8929 Other chronic pain: Secondary | ICD-10-CM

## 2017-01-24 DIAGNOSIS — M1712 Unilateral primary osteoarthritis, left knee: Secondary | ICD-10-CM

## 2017-01-24 DIAGNOSIS — S83242A Other tear of medial meniscus, current injury, left knee, initial encounter: Secondary | ICD-10-CM | POA: Insufficient documentation

## 2017-01-24 DIAGNOSIS — M2392 Unspecified internal derangement of left knee: Secondary | ICD-10-CM

## 2017-01-24 DIAGNOSIS — X58XXXA Exposure to other specified factors, initial encounter: Secondary | ICD-10-CM | POA: Diagnosis not present

## 2017-01-30 DIAGNOSIS — M1712 Unilateral primary osteoarthritis, left knee: Secondary | ICD-10-CM | POA: Diagnosis not present

## 2017-01-30 DIAGNOSIS — E669 Obesity, unspecified: Secondary | ICD-10-CM | POA: Diagnosis not present

## 2017-01-31 ENCOUNTER — Other Ambulatory Visit: Payer: Self-pay

## 2017-02-01 ENCOUNTER — Ambulatory Visit (INDEPENDENT_AMBULATORY_CARE_PROVIDER_SITE_OTHER): Payer: PPO | Admitting: Family Medicine

## 2017-02-01 ENCOUNTER — Encounter: Payer: Self-pay | Admitting: Family Medicine

## 2017-02-01 VITALS — BP 120/78 | HR 84 | Ht 66.0 in | Wt 211.0 lb

## 2017-02-01 DIAGNOSIS — Z7902 Long term (current) use of antithrombotics/antiplatelets: Secondary | ICD-10-CM

## 2017-02-01 DIAGNOSIS — Z5181 Encounter for therapeutic drug level monitoring: Secondary | ICD-10-CM | POA: Diagnosis not present

## 2017-02-01 DIAGNOSIS — Z01810 Encounter for preprocedural cardiovascular examination: Secondary | ICD-10-CM | POA: Diagnosis not present

## 2017-02-01 NOTE — Progress Notes (Signed)
Name: Ellen Baker   MRN: [id number]    DOB: 07-17-1950   Date:02/01/2017       Progress Note  Subjective  Chief Complaint  Chief Complaint  Patient presents with  . Pre-op Exam    no issues such as chest pain or concerns. L) knee replacement/ needs to know how long before surg to stop plavix.    Patient present for medical clearance for upcoming knee surgery.    No problem-specific Assessment & Plan notes found for this encounter.   Past Medical History:  Diagnosis Date  . Arthritis    right knee  . Carotid artery occlusion   . GERD (gastroesophageal reflux disease)   . Heart murmur   . Hyperlipidemia   . Hypertension   . Stroke [facility]) 12-31-12   no residual effects    Past Surgical History:  Procedure Laterality Date  . CAROTID ENDARTERECTOMY     RIGHT  01/19/13  . COLONOSCOPY  08-18-12  . ENDARTERECTOMY Right 01/19/2013   Procedure: ENDARTERECTOMY CAROTID with patch angioplasty;  Surgeon: Conrad Elgin, MD;  Location: [place];  Service: Vascular;  Laterality: Right;  . ESOPHAGEAL DILATION    . TUBAL LIGATION    . UPPER GI ENDOSCOPY  08-18-12    Family History  Problem Relation Age of Onset  . Stroke Mother   . Deep vein thrombosis Mother   . Heart disease Mother        Amputation-  . Hypertension Mother   . Alzheimer's disease Mother   . Heart failure Father   . Heart disease Father        CF  . Hypertension Father   . Varicose Veins Father   . Parkinson's disease Father     Social History   Social History  . Marital status: Widowed    Spouse name: N/A  . Number of children: N/A  . Years of education: N/A   Occupational History  . Not on file.   Social History Main Topics  . Smoking status: Never Smoker  . Smokeless tobacco: Never Used  . Alcohol use No  . Drug use: No  . Sexual activity: No   Other Topics Concern  . Not on file   Social History Narrative  . No narrative on file    Allergies  Allergen Reactions  . Adhesive [Tape] Rash   Skin became RAW    Outpatient Medications Prior to Visit  Medication Sig Dispense Refill  . Biotin w/ Vitamins C & E (HAIR SKIN & NAILS GUMMIES PO) Take 2 each by mouth daily.    . Calcium Carb-Ergocalciferol (CHEWABLE CALCIUM/D PO) Take 1 tablet by mouth daily.    . clopidogrel (PLAVIX) 75 MG tablet TAKE 1 TABLET(75 MG) BY MOUTH DAILY WITH BREAKFAST 90 tablet 1  . loratadine (CLARITIN) 10 MG tablet Take 10 mg by mouth continuous as needed for allergies.    Marland Kitchen losartan (COZAAR) 50 MG tablet TAKE 1 TABLET(50 MG) BY MOUTH DAILY 90 tablet 1  . meclizine (ANTIVERT) 25 MG tablet Take 1 tablet (25 mg total) by mouth 3 (three) times daily as needed for dizziness. 30 tablet 0  . Naproxen Sodium (ALEVE PO) Take by mouth continuous as needed.    . Omeprazole (PRILOSEC PO) Take by mouth. Patient is not sure of doseage- 20mg  qday otc    . simvastatin (ZOCOR) 20 MG tablet Take 1 tablet (20 mg total) by mouth daily. 90 tablet 1  . celecoxib (CELEBREX) 200 MG capsule  Take 1 capsule by mouth daily. Reche Dixon    . mupirocin ointment (BACTROBAN) 2 % Apply three times a day for 5 days. (Patient not taking: Reported on 11/27/2016) 22 g 0   No facility-administered medications prior to visit.     Review of Systems  Constitutional: Negative for chills, fever, malaise/fatigue and weight loss.  HENT: Negative for ear discharge, ear pain and sore throat.   Eyes: Negative for blurred vision.  Respiratory: Negative for cough, sputum production, shortness of breath and wheezing.   Cardiovascular: Negative for chest pain, palpitations and leg swelling.  Gastrointestinal: Negative for abdominal pain, blood in stool, constipation, diarrhea, heartburn, melena and nausea.  Genitourinary: Negative for dysuria, frequency, hematuria and urgency.  Musculoskeletal: Negative for back pain, joint pain, myalgias and neck pain.  Skin: Negative for rash.  Neurological: Negative for dizziness, tingling, sensory change, focal  weakness and headaches.  Endo/Heme/Allergies: Negative for environmental allergies and polydipsia. Does not bruise/bleed easily.  Psychiatric/Behavioral: Negative for depression and suicidal ideas. The patient is not nervous/anxious and does not have insomnia.      Objective  Vitals:   02/01/17 1351  BP: 120/78  Pulse: 84  Weight: 211 lb (95.7 kg)  Height: 5\' 6"  (1.676 m)    Physical Exam  Constitutional: She is well-developed, well-nourished, and in no distress. No distress.  HENT:  Head: Normocephalic and atraumatic.  Right Ear: External ear normal.  Left Ear: External ear normal.  Nose: Nose normal.  Mouth/Throat: Oropharynx is clear and moist.  Eyes: Pupils are equal, round, and reactive to light. Conjunctivae and EOM are normal. Right eye exhibits no discharge. Left eye exhibits no discharge.  Neck: Normal range of motion. Neck supple. No JVD present. No thyromegaly present.  Cardiovascular: Normal rate, regular rhythm, normal heart sounds and intact distal pulses.  Exam reveals no gallop and no friction rub.   No murmur heard. Pulmonary/Chest: Effort normal and breath sounds normal. She has no wheezes. She has no rales.  Abdominal: Soft. Bowel sounds are normal. She exhibits no mass. There is no tenderness. There is no guarding.  Musculoskeletal: Normal range of motion. She exhibits no edema.  Lymphadenopathy:    She has no cervical adenopathy.  Neurological: She is alert.  Skin: Skin is warm and dry. No rash noted. She is not diaphoretic. No erythema.  Psychiatric: Mood and affect normal.  Nursing note and vitals reviewed.     Assessment & Plan  Problem List Items Addressed This Visit    None    Visit Diagnoses    Encounter for pre-operative cardiovascular clearance    -  Primary   no contraindication for surgery   Visit for monitoring Plavix therapy       suggest holding plavix 1 week prior to surgery    no contraindications to knee surgery. Proceed with  surgery  No orders of the defined types were placed in this encounter.     Dr. [facility]  02/01/17

## 2017-04-01 ENCOUNTER — Other Ambulatory Visit: Payer: Self-pay

## 2017-04-08 DIAGNOSIS — I10 Essential (primary) hypertension: Secondary | ICD-10-CM | POA: Diagnosis not present

## 2017-04-08 DIAGNOSIS — Z01818 Encounter for other preprocedural examination: Secondary | ICD-10-CM | POA: Diagnosis not present

## 2017-04-08 DIAGNOSIS — M1712 Unilateral primary osteoarthritis, left knee: Secondary | ICD-10-CM | POA: Diagnosis not present

## 2017-04-08 DIAGNOSIS — Z01812 Encounter for preprocedural laboratory examination: Secondary | ICD-10-CM | POA: Diagnosis not present

## 2017-04-08 DIAGNOSIS — R001 Bradycardia, unspecified: Secondary | ICD-10-CM | POA: Diagnosis not present

## 2017-04-15 DIAGNOSIS — R42 Dizziness and giddiness: Secondary | ICD-10-CM | POA: Diagnosis not present

## 2017-04-15 DIAGNOSIS — E669 Obesity, unspecified: Secondary | ICD-10-CM | POA: Diagnosis not present

## 2017-04-15 DIAGNOSIS — D72829 Elevated white blood cell count, unspecified: Secondary | ICD-10-CM | POA: Diagnosis not present

## 2017-04-15 DIAGNOSIS — D649 Anemia, unspecified: Secondary | ICD-10-CM | POA: Diagnosis not present

## 2017-04-15 DIAGNOSIS — Z8673 Personal history of transient ischemic attack (TIA), and cerebral infarction without residual deficits: Secondary | ICD-10-CM | POA: Diagnosis not present

## 2017-04-15 DIAGNOSIS — I69818 Other symptoms and signs involving cognitive functions following other cerebrovascular disease: Secondary | ICD-10-CM | POA: Diagnosis not present

## 2017-04-15 DIAGNOSIS — M25562 Pain in left knee: Secondary | ICD-10-CM | POA: Diagnosis not present

## 2017-04-15 DIAGNOSIS — E785 Hyperlipidemia, unspecified: Secondary | ICD-10-CM | POA: Diagnosis not present

## 2017-04-15 DIAGNOSIS — G8918 Other acute postprocedural pain: Secondary | ICD-10-CM | POA: Diagnosis not present

## 2017-04-15 DIAGNOSIS — I10 Essential (primary) hypertension: Secondary | ICD-10-CM | POA: Diagnosis not present

## 2017-04-15 DIAGNOSIS — I6521 Occlusion and stenosis of right carotid artery: Secondary | ICD-10-CM | POA: Diagnosis not present

## 2017-04-15 DIAGNOSIS — K219 Gastro-esophageal reflux disease without esophagitis: Secondary | ICD-10-CM | POA: Diagnosis not present

## 2017-04-15 DIAGNOSIS — M1712 Unilateral primary osteoarthritis, left knee: Secondary | ICD-10-CM | POA: Diagnosis not present

## 2017-04-15 DIAGNOSIS — Z6837 Body mass index (BMI) 37.0-37.9, adult: Secondary | ICD-10-CM | POA: Diagnosis not present

## 2017-04-15 HISTORY — PX: JOINT REPLACEMENT: SHX530

## 2017-04-18 DIAGNOSIS — E669 Obesity, unspecified: Secondary | ICD-10-CM | POA: Diagnosis not present

## 2017-04-18 DIAGNOSIS — I10 Essential (primary) hypertension: Secondary | ICD-10-CM | POA: Diagnosis not present

## 2017-04-18 DIAGNOSIS — Z8673 Personal history of transient ischemic attack (TIA), and cerebral infarction without residual deficits: Secondary | ICD-10-CM | POA: Diagnosis not present

## 2017-04-18 DIAGNOSIS — Z96652 Presence of left artificial knee joint: Secondary | ICD-10-CM | POA: Diagnosis not present

## 2017-04-18 DIAGNOSIS — K219 Gastro-esophageal reflux disease without esophagitis: Secondary | ICD-10-CM | POA: Diagnosis not present

## 2017-04-18 DIAGNOSIS — E785 Hyperlipidemia, unspecified: Secondary | ICD-10-CM | POA: Diagnosis not present

## 2017-04-18 DIAGNOSIS — Z471 Aftercare following joint replacement surgery: Secondary | ICD-10-CM | POA: Diagnosis not present

## 2017-04-18 DIAGNOSIS — Z79891 Long term (current) use of opiate analgesic: Secondary | ICD-10-CM | POA: Diagnosis not present

## 2017-04-18 DIAGNOSIS — Z7901 Long term (current) use of anticoagulants: Secondary | ICD-10-CM | POA: Diagnosis not present

## 2017-04-18 DIAGNOSIS — Z6837 Body mass index (BMI) 37.0-37.9, adult: Secondary | ICD-10-CM | POA: Diagnosis not present

## 2017-04-18 DIAGNOSIS — I6529 Occlusion and stenosis of unspecified carotid artery: Secondary | ICD-10-CM | POA: Diagnosis not present

## 2017-04-22 DIAGNOSIS — Z8673 Personal history of transient ischemic attack (TIA), and cerebral infarction without residual deficits: Secondary | ICD-10-CM | POA: Diagnosis not present

## 2017-04-22 DIAGNOSIS — I10 Essential (primary) hypertension: Secondary | ICD-10-CM | POA: Diagnosis not present

## 2017-04-22 DIAGNOSIS — E669 Obesity, unspecified: Secondary | ICD-10-CM | POA: Diagnosis not present

## 2017-04-22 DIAGNOSIS — Z471 Aftercare following joint replacement surgery: Secondary | ICD-10-CM | POA: Diagnosis not present

## 2017-04-22 DIAGNOSIS — Z7901 Long term (current) use of anticoagulants: Secondary | ICD-10-CM | POA: Diagnosis not present

## 2017-04-22 DIAGNOSIS — I6529 Occlusion and stenosis of unspecified carotid artery: Secondary | ICD-10-CM | POA: Diagnosis not present

## 2017-04-22 DIAGNOSIS — K219 Gastro-esophageal reflux disease without esophagitis: Secondary | ICD-10-CM | POA: Diagnosis not present

## 2017-04-22 DIAGNOSIS — Z79891 Long term (current) use of opiate analgesic: Secondary | ICD-10-CM | POA: Diagnosis not present

## 2017-04-22 DIAGNOSIS — E785 Hyperlipidemia, unspecified: Secondary | ICD-10-CM | POA: Diagnosis not present

## 2017-04-22 DIAGNOSIS — Z96652 Presence of left artificial knee joint: Secondary | ICD-10-CM | POA: Diagnosis not present

## 2017-04-22 DIAGNOSIS — Z6837 Body mass index (BMI) 37.0-37.9, adult: Secondary | ICD-10-CM | POA: Diagnosis not present

## 2017-04-23 ENCOUNTER — Other Ambulatory Visit: Payer: Self-pay

## 2017-04-23 NOTE — Patient Outreach (Signed)
[facility]) Care Management  04/23/2017  Ellen Baker 1950-11-01 [id number]   Transition of care  Referral date: 04/19/17 Referral source: discharged from inpatient admission from [facility] on 04/17/17 Insurance: [employer] advantage Attempt #1  Telephone call to patient regarding Transition of care follow up. Unable to reach patient. HIPAA compliant voice message left with call back phone number.   PLAN: RNCM will attempt 2nd telephone call to patient within 5 business days.   Quinn Plowman RN,BSN,CCM [facility] Telephonic  [phone]

## 2017-04-24 ENCOUNTER — Ambulatory Visit: Payer: Self-pay

## 2017-04-24 ENCOUNTER — Other Ambulatory Visit: Payer: Self-pay

## 2017-04-24 NOTE — Patient Outreach (Signed)
[facility]) Care Management  04/24/2017  Ellen Baker July 24, 1950 [id number]   Transition of care  Referral date: 04/19/17 Referral source: discharged from inpatient admission from [facility] hospital on 04/17/17 Insurance: Health team advantage Attempt #2  Second telephone call to patient regarding Transition of care follow up. Unable to reach patient. HIPAA compliant voice message left with call back phone number.   PLAN: RNCM will attempt 3rd telephone call to patient within 5 business days.   Quinn Plowman RN,BSN,CCM [facility] Telephonic  [phone]

## 2017-04-29 ENCOUNTER — Other Ambulatory Visit: Payer: Self-pay

## 2017-04-29 DIAGNOSIS — E785 Hyperlipidemia, unspecified: Secondary | ICD-10-CM | POA: Diagnosis not present

## 2017-04-29 DIAGNOSIS — Z7901 Long term (current) use of anticoagulants: Secondary | ICD-10-CM | POA: Diagnosis not present

## 2017-04-29 DIAGNOSIS — Z8673 Personal history of transient ischemic attack (TIA), and cerebral infarction without residual deficits: Secondary | ICD-10-CM | POA: Diagnosis not present

## 2017-04-29 DIAGNOSIS — I10 Essential (primary) hypertension: Secondary | ICD-10-CM | POA: Diagnosis not present

## 2017-04-29 DIAGNOSIS — E669 Obesity, unspecified: Secondary | ICD-10-CM | POA: Diagnosis not present

## 2017-04-29 DIAGNOSIS — I6529 Occlusion and stenosis of unspecified carotid artery: Secondary | ICD-10-CM | POA: Diagnosis not present

## 2017-04-29 DIAGNOSIS — Z6837 Body mass index (BMI) 37.0-37.9, adult: Secondary | ICD-10-CM | POA: Diagnosis not present

## 2017-04-29 DIAGNOSIS — K219 Gastro-esophageal reflux disease without esophagitis: Secondary | ICD-10-CM | POA: Diagnosis not present

## 2017-04-29 DIAGNOSIS — Z471 Aftercare following joint replacement surgery: Secondary | ICD-10-CM | POA: Diagnosis not present

## 2017-04-29 DIAGNOSIS — Z79891 Long term (current) use of opiate analgesic: Secondary | ICD-10-CM | POA: Diagnosis not present

## 2017-04-29 DIAGNOSIS — Z96652 Presence of left artificial knee joint: Secondary | ICD-10-CM | POA: Diagnosis not present

## 2017-04-29 NOTE — Patient Outreach (Signed)
[facility]) Care Management  04/29/2017  Ellen Baker 1951-06-17 [id number]  Transition of care  Referral date: 04/23/17 Referral source: discharged from an inpatient admission from [facility]  On 04/17/17 Insurance: health team advantage  Telephone call to patient regarding transition of care follow up . HIPAA verified with patient. Discussed transition of care follow up program with patient.  Patient denies need for ongoing transition of care follow up. Patient states she has good family  support. Patient states she is receiving home health physical therapy with [facility]. Patient states she has a follow up appointment with her orthopedic surgeon 05/02/17. Patient states her next follow up appointment with her primary MD is December 2018.  RNCM advised patient to call her primary MD office for post hospital follow up appointment.  RNCM reviewed signs/ symptoms of infection with patient. RNCM advised patient to notify her doctor for these symptoms. Patient verbalized understanding.  RNCM advised patient to notify MD of any changes in condition prior to scheduled appointment. RNCM verified patient aware of 911 services for urgent/ emergent needs.   PLAN: RNCM will refer patient to care management assistant to close patient due to patient refusing services. RNCM will notify patients primary MD of closure.  RNCM will send patient [facility] care management brochure.  Quinn Plowman RN,BSN,CCM [facility] Telephonic  [phone]

## 2017-05-01 DIAGNOSIS — I6529 Occlusion and stenosis of unspecified carotid artery: Secondary | ICD-10-CM | POA: Diagnosis not present

## 2017-05-01 DIAGNOSIS — Z8673 Personal history of transient ischemic attack (TIA), and cerebral infarction without residual deficits: Secondary | ICD-10-CM | POA: Diagnosis not present

## 2017-05-01 DIAGNOSIS — E785 Hyperlipidemia, unspecified: Secondary | ICD-10-CM | POA: Diagnosis not present

## 2017-05-01 DIAGNOSIS — Z79891 Long term (current) use of opiate analgesic: Secondary | ICD-10-CM | POA: Diagnosis not present

## 2017-05-01 DIAGNOSIS — Z7901 Long term (current) use of anticoagulants: Secondary | ICD-10-CM | POA: Diagnosis not present

## 2017-05-01 DIAGNOSIS — E669 Obesity, unspecified: Secondary | ICD-10-CM | POA: Diagnosis not present

## 2017-05-01 DIAGNOSIS — Z471 Aftercare following joint replacement surgery: Secondary | ICD-10-CM | POA: Diagnosis not present

## 2017-05-01 DIAGNOSIS — Z6837 Body mass index (BMI) 37.0-37.9, adult: Secondary | ICD-10-CM | POA: Diagnosis not present

## 2017-05-01 DIAGNOSIS — I10 Essential (primary) hypertension: Secondary | ICD-10-CM | POA: Diagnosis not present

## 2017-05-01 DIAGNOSIS — K219 Gastro-esophageal reflux disease without esophagitis: Secondary | ICD-10-CM | POA: Diagnosis not present

## 2017-05-01 DIAGNOSIS — Z96652 Presence of left artificial knee joint: Secondary | ICD-10-CM | POA: Diagnosis not present

## 2017-05-06 DIAGNOSIS — M6281 Muscle weakness (generalized): Secondary | ICD-10-CM | POA: Diagnosis not present

## 2017-05-06 DIAGNOSIS — M25562 Pain in left knee: Secondary | ICD-10-CM | POA: Diagnosis not present

## 2017-05-06 DIAGNOSIS — M25662 Stiffness of left knee, not elsewhere classified: Secondary | ICD-10-CM | POA: Diagnosis not present

## 2017-05-06 DIAGNOSIS — Z96652 Presence of left artificial knee joint: Secondary | ICD-10-CM | POA: Diagnosis not present

## 2017-05-07 DIAGNOSIS — M25662 Stiffness of left knee, not elsewhere classified: Secondary | ICD-10-CM | POA: Diagnosis not present

## 2017-05-07 DIAGNOSIS — M6281 Muscle weakness (generalized): Secondary | ICD-10-CM | POA: Diagnosis not present

## 2017-05-07 DIAGNOSIS — Z96652 Presence of left artificial knee joint: Secondary | ICD-10-CM | POA: Diagnosis not present

## 2017-05-07 DIAGNOSIS — M25562 Pain in left knee: Secondary | ICD-10-CM | POA: Diagnosis not present

## 2017-05-13 DIAGNOSIS — M6281 Muscle weakness (generalized): Secondary | ICD-10-CM | POA: Diagnosis not present

## 2017-05-13 DIAGNOSIS — M25662 Stiffness of left knee, not elsewhere classified: Secondary | ICD-10-CM | POA: Diagnosis not present

## 2017-05-13 DIAGNOSIS — Z96652 Presence of left artificial knee joint: Secondary | ICD-10-CM | POA: Diagnosis not present

## 2017-05-13 DIAGNOSIS — M25562 Pain in left knee: Secondary | ICD-10-CM | POA: Diagnosis not present

## 2017-05-15 DIAGNOSIS — M6281 Muscle weakness (generalized): Secondary | ICD-10-CM | POA: Diagnosis not present

## 2017-05-15 DIAGNOSIS — M25562 Pain in left knee: Secondary | ICD-10-CM | POA: Diagnosis not present

## 2017-05-15 DIAGNOSIS — M25662 Stiffness of left knee, not elsewhere classified: Secondary | ICD-10-CM | POA: Diagnosis not present

## 2017-05-15 DIAGNOSIS — Z96652 Presence of left artificial knee joint: Secondary | ICD-10-CM | POA: Diagnosis not present

## 2017-05-17 DIAGNOSIS — M6281 Muscle weakness (generalized): Secondary | ICD-10-CM | POA: Diagnosis not present

## 2017-05-17 DIAGNOSIS — M25562 Pain in left knee: Secondary | ICD-10-CM | POA: Diagnosis not present

## 2017-05-17 DIAGNOSIS — Z96652 Presence of left artificial knee joint: Secondary | ICD-10-CM | POA: Diagnosis not present

## 2017-05-17 DIAGNOSIS — M25662 Stiffness of left knee, not elsewhere classified: Secondary | ICD-10-CM | POA: Diagnosis not present

## 2017-05-20 DIAGNOSIS — Z96652 Presence of left artificial knee joint: Secondary | ICD-10-CM | POA: Diagnosis not present

## 2017-05-20 DIAGNOSIS — M25562 Pain in left knee: Secondary | ICD-10-CM | POA: Diagnosis not present

## 2017-05-20 DIAGNOSIS — M25662 Stiffness of left knee, not elsewhere classified: Secondary | ICD-10-CM | POA: Diagnosis not present

## 2017-05-20 DIAGNOSIS — M6281 Muscle weakness (generalized): Secondary | ICD-10-CM | POA: Diagnosis not present

## 2017-05-22 DIAGNOSIS — M25662 Stiffness of left knee, not elsewhere classified: Secondary | ICD-10-CM | POA: Diagnosis not present

## 2017-05-22 DIAGNOSIS — M25562 Pain in left knee: Secondary | ICD-10-CM | POA: Diagnosis not present

## 2017-05-22 DIAGNOSIS — Z96652 Presence of left artificial knee joint: Secondary | ICD-10-CM | POA: Diagnosis not present

## 2017-05-22 DIAGNOSIS — M6281 Muscle weakness (generalized): Secondary | ICD-10-CM | POA: Diagnosis not present

## 2017-05-23 DIAGNOSIS — M25662 Stiffness of left knee, not elsewhere classified: Secondary | ICD-10-CM | POA: Diagnosis not present

## 2017-05-23 DIAGNOSIS — M25562 Pain in left knee: Secondary | ICD-10-CM | POA: Diagnosis not present

## 2017-05-23 DIAGNOSIS — Z96652 Presence of left artificial knee joint: Secondary | ICD-10-CM | POA: Diagnosis not present

## 2017-05-23 DIAGNOSIS — M6281 Muscle weakness (generalized): Secondary | ICD-10-CM | POA: Diagnosis not present

## 2017-05-29 DIAGNOSIS — M25662 Stiffness of left knee, not elsewhere classified: Secondary | ICD-10-CM | POA: Diagnosis not present

## 2017-05-29 DIAGNOSIS — Z96652 Presence of left artificial knee joint: Secondary | ICD-10-CM | POA: Diagnosis not present

## 2017-05-29 DIAGNOSIS — M6281 Muscle weakness (generalized): Secondary | ICD-10-CM | POA: Diagnosis not present

## 2017-05-29 DIAGNOSIS — M25562 Pain in left knee: Secondary | ICD-10-CM | POA: Diagnosis not present

## 2017-05-31 ENCOUNTER — Other Ambulatory Visit: Payer: Self-pay | Admitting: Family Medicine

## 2017-05-31 DIAGNOSIS — I10 Essential (primary) hypertension: Secondary | ICD-10-CM

## 2017-05-31 DIAGNOSIS — I679 Cerebrovascular disease, unspecified: Secondary | ICD-10-CM

## 2017-05-31 DIAGNOSIS — E785 Hyperlipidemia, unspecified: Secondary | ICD-10-CM

## 2017-06-05 ENCOUNTER — Encounter: Payer: Self-pay | Admitting: Family Medicine

## 2017-06-05 ENCOUNTER — Ambulatory Visit: Payer: PPO | Admitting: Family Medicine

## 2017-06-05 VITALS — BP 120/70 | HR 64 | Ht 66.0 in | Wt 208.0 lb

## 2017-06-05 DIAGNOSIS — H8309 Labyrinthitis, unspecified ear: Secondary | ICD-10-CM | POA: Diagnosis not present

## 2017-06-05 DIAGNOSIS — F5101 Primary insomnia: Secondary | ICD-10-CM

## 2017-06-05 DIAGNOSIS — K219 Gastro-esophageal reflux disease without esophagitis: Secondary | ICD-10-CM | POA: Diagnosis not present

## 2017-06-05 DIAGNOSIS — E785 Hyperlipidemia, unspecified: Secondary | ICD-10-CM

## 2017-06-05 DIAGNOSIS — I10 Essential (primary) hypertension: Secondary | ICD-10-CM | POA: Diagnosis not present

## 2017-06-05 DIAGNOSIS — I679 Cerebrovascular disease, unspecified: Secondary | ICD-10-CM | POA: Diagnosis not present

## 2017-06-05 MED ORDER — TRAZODONE HCL 50 MG PO TABS: 50.0000 mg | ORAL_TABLET | Freq: Every day | ORAL | 11 refills | Status: DC

## 2017-06-05 MED ORDER — MECLIZINE HCL 25 MG PO TABS: 25.0000 mg | ORAL_TABLET | Freq: Three times a day (TID) | ORAL | 0 refills | Status: DC | PRN

## 2017-06-05 MED ORDER — SIMVASTATIN 20 MG PO TABS: 20.0000 mg | ORAL_TABLET | Freq: Every day | ORAL | 1 refills | Status: DC

## 2017-06-05 MED ORDER — LOSARTAN POTASSIUM 50 MG PO TABS: ORAL_TABLET | ORAL | 1 refills | Status: DC

## 2017-06-05 MED ORDER — CLOPIDOGREL BISULFATE 75 MG PO TABS: ORAL_TABLET | ORAL | 1 refills | Status: DC

## 2017-06-05 MED ORDER — OMEPRAZOLE 10 MG PO CPDR: 10.0000 mg | DELAYED_RELEASE_CAPSULE | Freq: Every day | ORAL | 11 refills | Status: DC

## 2017-06-05 NOTE — Progress Notes (Signed)
Name: Ellen Baker   MRN: [id number]    DOB: 05/06/1951   Date:06/05/2017       Progress Note  Subjective  Chief Complaint  Chief Complaint  Patient presents with  . Hyperlipidemia  . Hypertension  . Gastroesophageal Reflux  . Cerebrovascular Accident    takes plavix for this    Hyperlipidemia  This is a chronic problem. The current episode started more than 1 year ago. The problem is controlled. Recent lipid tests were reviewed and are normal. Exacerbating diseases include obesity. She has no history of chronic renal disease or diabetes. There are no known factors aggravating her hyperlipidemia. Pertinent negatives include no chest pain, focal sensory loss, focal weakness, leg pain, myalgias or shortness of breath. Current antihyperlipidemic treatment includes statins. The current treatment provides moderate improvement of lipids. There are no compliance problems.  Risk factors for coronary artery disease include obesity and dyslipidemia.  Hypertension  This is a chronic problem. The current episode started more than 1 year ago. The problem has been gradually improving since onset. The problem is controlled. Pertinent negatives include no anxiety, blurred vision, chest pain, headaches, malaise/fatigue, neck pain, orthopnea, palpitations, peripheral edema, PND, shortness of breath or sweats. There are no associated agents to hypertension. Risk factors for coronary artery disease include dyslipidemia. Past treatments include angiotensin blockers. The current treatment provides moderate improvement. There are no compliance problems.  There is no history of angina, kidney disease, CAD/MI, CVA, heart failure, left ventricular hypertrophy, PVD or retinopathy. There is no history of chronic renal disease, a hypertension causing med or renovascular disease.  Gastroesophageal Reflux  She reports no abdominal pain, no belching, no chest pain, no choking, no coughing, no dysphagia, no early satiety, no  globus sensation, no heartburn, no nausea, no sore throat, no stridor or no wheezing. This is a chronic problem. The current episode started more than 1 year ago. The problem has been waxing and waning. The symptoms are aggravated by certain foods. Pertinent negatives include no melena or weight loss. She has tried a PPI for the symptoms. The treatment provided moderate relief.  Cerebrovascular Accident  This is a chronic problem. The current episode started more than 1 year ago. The problem occurs rarely. The problem has been unchanged (no episodes). Pertinent negatives include no abdominal pain, chest pain, chills, coughing, fever, headaches, myalgias, nausea, neck pain, rash or sore throat. Treatments tried: plavix. The treatment provided moderate relief.  Insomnia  Primary symptoms: difficulty falling asleep, no malaise/fatigue.  The current episode started more than one year. The onset quality is gradual. The problem occurs nightly. PMH includes: no depression.    No problem-specific Assessment & Plan notes found for this encounter.   Past Medical History:  Diagnosis Date  . Arthritis    right knee  . Carotid artery occlusion   . GERD (gastroesophageal reflux disease)   . Heart murmur   . Hyperlipidemia   . Hypertension   . Stroke [facility]) 12-31-12   no residual effects    Past Surgical History:  Procedure Laterality Date  . CAROTID ENDARTERECTOMY     RIGHT  01/19/13  . COLONOSCOPY  08-18-12  . ENDARTERECTOMY Right 01/19/2013   Procedure: ENDARTERECTOMY CAROTID with patch angioplasty;  Surgeon: Conrad Euclid, MD;  Location: [facility];  Service: Vascular;  Laterality: Right;  . ESOPHAGEAL DILATION    . TUBAL LIGATION    . UPPER GI ENDOSCOPY  08-18-12    Family History  Problem Relation Age of  Onset  . Stroke Mother   . Deep vein thrombosis Mother   . Heart disease Mother        Amputation-  . Hypertension Mother   . Alzheimer's disease Mother   . Heart failure Father   . Heart  disease Father        CF  . Hypertension Father   . Varicose Veins Father   . Parkinson's disease Father     Social History   Socioeconomic History  . Marital status: Widowed    Spouse name: Not on file  . Number of children: Not on file  . Years of education: Not on file  . Highest education level: Not on file  Social Needs  . Financial resource strain: Not on file  . Food insecurity - worry: Not on file  . Food insecurity - inability: Not on file  . Transportation needs - medical: Not on file  . Transportation needs - non-medical: Not on file  Occupational History  . Not on file  Tobacco Use  . Smoking status: Never Smoker  . Smokeless tobacco: Never Used  Substance and Sexual Activity  . Alcohol use: No    Alcohol/week: 0.0 oz  . Drug use: No  . Sexual activity: No  Other Topics Concern  . Not on file  Social History Narrative  . Not on file    Allergies  Allergen Reactions  . Adhesive [Tape] Rash    Skin became RAW    Outpatient Medications Prior to Visit  Medication Sig Dispense Refill  . Biotin w/ Vitamins C & E (HAIR SKIN & NAILS GUMMIES PO) Take 2 each by mouth daily.    . Calcium Carb-Ergocalciferol (CHEWABLE CALCIUM/D PO) Take 1 tablet by mouth daily.    Marland Kitchen loratadine (CLARITIN) 10 MG tablet Take 10 mg by mouth continuous as needed for allergies.    . Naproxen Sodium (ALEVE PO) Take by mouth continuous as needed.    Marland Kitchen apixaban (ELIQUIS) 2.5 MG TABS tablet Take 2.5 mg 2 (two) times daily by mouth.    . clopidogrel (PLAVIX) 75 MG tablet TAKE 1 TABLET BY MOUTH DAILY WITH BREAKFAST 90 tablet 0  . losartan (COZAAR) 50 MG tablet TAKE 1 TABLET BY MOUTH DAILY 90 tablet 0  . Omeprazole (PRILOSEC PO) Take by mouth. Patient is not sure of doseage- 20mg  qday otc    . simvastatin (ZOCOR) 20 MG tablet TAKE 1 TABLET BY MOUTH DAILY 90 tablet 0  . meclizine (ANTIVERT) 25 MG tablet Take 1 tablet (25 mg total) by mouth 3 (three) times daily as needed for dizziness.  (Patient not taking: Reported on 04/29/2017) 30 tablet 0   No facility-administered medications prior to visit.     Review of Systems  Constitutional: Negative for chills, fever, malaise/fatigue and weight loss.  HENT: Negative for ear discharge, ear pain and sore throat.   Eyes: Negative for blurred vision.  Respiratory: Negative for cough, sputum production, choking, shortness of breath and wheezing.   Cardiovascular: Negative for chest pain, palpitations, orthopnea, leg swelling and PND.  Gastrointestinal: Negative for abdominal pain, blood in stool, constipation, diarrhea, dysphagia, heartburn, melena and nausea.  Genitourinary: Negative for dysuria, frequency, hematuria and urgency.  Musculoskeletal: Negative for back pain, joint pain, myalgias and neck pain.  Skin: Negative for rash.  Neurological: Negative for dizziness, tingling, sensory change, focal weakness and headaches.  Endo/Heme/Allergies: Negative for environmental allergies and polydipsia. Does not bruise/bleed easily.  Psychiatric/Behavioral: Negative for depression and suicidal ideas. The  patient has insomnia. The patient is not nervous/anxious.      Objective  Vitals:   06/05/17 0850  BP: 120/70  Pulse: 64  Weight: 208 lb (94.3 kg)  Height: 5\' 6"  (1.676 m)    Physical Exam  Constitutional: She is well-developed, well-nourished, and in no distress. No distress.  HENT:  Head: Normocephalic and atraumatic.  Right Ear: External ear normal.  Left Ear: External ear normal.  Nose: Nose normal.  Mouth/Throat: Oropharynx is clear and moist.  Eyes: Conjunctivae and EOM are normal. Pupils are equal, round, and reactive to light. Right eye exhibits no discharge. Left eye exhibits no discharge.  Neck: Normal range of motion. Neck supple. No JVD present. No thyromegaly present.  Cardiovascular: Normal rate, regular rhythm, normal heart sounds and intact distal pulses. Exam reveals no gallop and no friction rub.  No  murmur heard. Pulmonary/Chest: Effort normal and breath sounds normal. She has no wheezes. She has no rales.  Abdominal: Soft. Bowel sounds are normal. She exhibits no mass. There is no tenderness. There is no guarding.  Musculoskeletal: Normal range of motion. She exhibits no edema.  Lymphadenopathy:    She has no cervical adenopathy.  Neurological: She is alert. She has normal reflexes.  Skin: Skin is warm and dry. She is not diaphoretic.  Psychiatric: Mood and affect normal.  Nursing note and vitals reviewed.     Assessment & Plan  Problem List Items Addressed This Visit      Cardiovascular and Mediastinum   HTN (hypertension)   Relevant Medications   simvastatin (ZOCOR) 20 MG tablet   losartan (COZAAR) 50 MG tablet   Cerebral vascular insufficiency - Primary   Relevant Medications   clopidogrel (PLAVIX) 75 MG tablet   simvastatin (ZOCOR) 20 MG tablet   losartan (COZAAR) 50 MG tablet     Digestive   GERD (gastroesophageal reflux disease)   Relevant Medications   meclizine (ANTIVERT) 25 MG tablet   omeprazole (PRILOSEC) 10 MG capsule     Other   Dyslipidemia   Relevant Medications   simvastatin (ZOCOR) 20 MG tablet    Other Visit Diagnoses    Labyrinthitis, acute, unspecified laterality       Relevant Medications   meclizine (ANTIVERT) 25 MG tablet   Primary insomnia       Relevant Medications   traZODone (DESYREL) 50 MG tablet      Meds ordered this encounter  Medications  . clopidogrel (PLAVIX) 75 MG tablet    Sig: TAKE 1 TABLET BY MOUTH DAILY WITH BREAKFAST    Dispense:  90 tablet    Refill:  1  . simvastatin (ZOCOR) 20 MG tablet    Sig: Take 1 tablet (20 mg total) by mouth daily.    Dispense:  90 tablet    Refill:  1  . losartan (COZAAR) 50 MG tablet    Sig: TAKE 1 TABLET BY MOUTH DAILY    Dispense:  90 tablet    Refill:  1  . meclizine (ANTIVERT) 25 MG tablet    Sig: Take 1 tablet (25 mg total) by mouth 3 (three) times daily as needed for  dizziness.    Dispense:  30 tablet    Refill:  0  . omeprazole (PRILOSEC) 10 MG capsule    Sig: Take 1 capsule (10 mg total) by mouth daily. Patient is not sure of doseage- 20mg  qday otc    Dispense:  30 capsule    Refill:  11  . traZODone (DESYREL)  50 MG tablet    Sig: Take 1 tablet (50 mg total) by mouth at bedtime.    Dispense:  30 tablet    Refill:  11      Dr. Pamila Mend[facility]  06/05/17

## 2017-06-06 DIAGNOSIS — M25562 Pain in left knee: Secondary | ICD-10-CM | POA: Diagnosis not present

## 2017-06-06 DIAGNOSIS — M6281 Muscle weakness (generalized): Secondary | ICD-10-CM | POA: Diagnosis not present

## 2017-06-06 DIAGNOSIS — M25662 Stiffness of left knee, not elsewhere classified: Secondary | ICD-10-CM | POA: Diagnosis not present

## 2017-06-06 DIAGNOSIS — Z96652 Presence of left artificial knee joint: Secondary | ICD-10-CM | POA: Diagnosis not present

## 2017-06-13 DIAGNOSIS — M6281 Muscle weakness (generalized): Secondary | ICD-10-CM | POA: Diagnosis not present

## 2017-06-13 DIAGNOSIS — Z96652 Presence of left artificial knee joint: Secondary | ICD-10-CM | POA: Diagnosis not present

## 2017-06-13 DIAGNOSIS — M25662 Stiffness of left knee, not elsewhere classified: Secondary | ICD-10-CM | POA: Diagnosis not present

## 2017-06-13 DIAGNOSIS — M25562 Pain in left knee: Secondary | ICD-10-CM | POA: Diagnosis not present

## 2017-08-06 DIAGNOSIS — E669 Obesity, unspecified: Secondary | ICD-10-CM | POA: Diagnosis not present

## 2017-08-06 DIAGNOSIS — Z96652 Presence of left artificial knee joint: Secondary | ICD-10-CM | POA: Diagnosis not present

## 2017-08-30 ENCOUNTER — Other Ambulatory Visit: Payer: Self-pay | Admitting: Family Medicine

## 2017-08-30 DIAGNOSIS — I679 Cerebrovascular disease, unspecified: Secondary | ICD-10-CM

## 2017-08-30 DIAGNOSIS — I10 Essential (primary) hypertension: Secondary | ICD-10-CM

## 2017-09-23 ENCOUNTER — Telehealth: Payer: Self-pay

## 2017-09-23 NOTE — Telephone Encounter (Signed)
12/05/17 AWV needing rescheduled. Program no longer available on Thursdays. Does not appear this pt has had a previous AWV. LVM requesting returned call.

## 2017-09-25 ENCOUNTER — Telehealth: Payer: Self-pay

## 2017-09-25 NOTE — Telephone Encounter (Signed)
12/05/17 AWV rescheduled to 12/09/17.

## 2017-11-18 ENCOUNTER — Other Ambulatory Visit: Payer: Self-pay

## 2017-11-18 DIAGNOSIS — I6521 Occlusion and stenosis of right carotid artery: Secondary | ICD-10-CM

## 2017-11-18 DIAGNOSIS — Z9889 Other specified postprocedural states: Secondary | ICD-10-CM

## 2017-11-29 ENCOUNTER — Encounter: Payer: Self-pay | Admitting: Family

## 2017-11-29 ENCOUNTER — Ambulatory Visit (HOSPITAL_COMMUNITY)
Admission: RE | Admit: 2017-11-29 | Discharge: 2017-11-29 | Disposition: A | Discharge status: Home / Self Care | Payer: PPO | Source: Ambulatory Visit | Attending: Family | Admitting: Family

## 2017-11-29 ENCOUNTER — Ambulatory Visit: Payer: PPO | Admitting: Family

## 2017-11-29 VITALS — BP 162/94 | HR 62 | Temp 97.3°F | Ht 64.0 in | Wt 219.5 lb

## 2017-11-29 DIAGNOSIS — I6521 Occlusion and stenosis of right carotid artery: Secondary | ICD-10-CM | POA: Insufficient documentation

## 2017-11-29 DIAGNOSIS — Z9889 Other specified postprocedural states: Secondary | ICD-10-CM

## 2017-11-29 NOTE — Progress Notes (Signed)
Chief Complaint: Follow up Extracranial Carotid Artery Stenosis   History of Present Illness  Ellen Baker is a 67 y.o. female who is s/p R CEA (Date: 01/19/13) by Dr. Bridgett Larsson. She returns today for routine surveillance.  She had a TIA July, 2014, as manifested by transient left side diminished sensation and left hemiparesis, was evaluated at [place], then had the left CEA.  She denies any subsequent stroke or TIA activity.   She states her blood pressure this morning at home was 117/64.  She denies any history of denies amaurosis fugax or monocular blindness, unilateral facial drooping. or receptive or expressive aphasia.  She denies any residual neurological deficits.   She denies claudication type symptoms with walking. Her walking is limited by her right knee pain.   She had left knee replacement 04-15-17, states she needs the right knee replaced.   Pt states she has a hx of irregular heart rhythm, states her last echocardiogram was in 2010 for evaluation of syncope, states was normal. Pt states she has not informed her PCP of this hx, I advised her to inform her PCP. Her cardiac rhythm has been regular on my assessments.   Diabetic: No Tobacco use: non-smoker  Pt meds include:  Statin : Yes  ASA: No  Other anticoagulants/antiplatelets: Plavix, taking since TIA diagnosed    Past Medical History:  Diagnosis Date  . Arthritis    right knee  . Carotid artery occlusion   . GERD (gastroesophageal reflux disease)   . Heart murmur   . Hyperlipidemia   . Hypertension   . Stroke [facility]) 12-31-12   no residual effects    Social History Social History   Tobacco Use  . Smoking status: Never Smoker  . Smokeless tobacco: Never Used  Substance Use Topics  . Alcohol use: No    Alcohol/week: 0.0 oz  . Drug use: No    Family History Family History  Problem Relation Age of Onset  . Stroke Mother   . Deep vein thrombosis Mother   . Heart disease Mother    Amputation-  . Hypertension Mother   . Alzheimer's disease Mother   . Heart failure Father   . Heart disease Father        CF  . Hypertension Father   . Varicose Veins Father   . Parkinson's disease Father     Surgical History Past Surgical History:  Procedure Laterality Date  . CAROTID ENDARTERECTOMY     RIGHT  01/19/13  . COLONOSCOPY  08-18-12  . ENDARTERECTOMY Right 01/19/2013   Procedure: ENDARTERECTOMY CAROTID with patch angioplasty;  Surgeon: Conrad Montezuma, MD;  Location: [place];  Service: Vascular;  Laterality: Right;  . ESOPHAGEAL DILATION    . TUBAL LIGATION    . UPPER GI ENDOSCOPY  08-18-12    Allergies  Allergen Reactions  . Adhesive [Tape] Rash    Skin became RAW    Current Outpatient Medications  Medication Sig Dispense Refill  . Biotin w/ Vitamins C & E (HAIR SKIN & NAILS GUMMIES PO) Take 2 each by mouth daily.    . Calcium Carb-Ergocalciferol (CHEWABLE CALCIUM/D PO) Take 1 tablet by mouth daily.    . clopidogrel (PLAVIX) 75 MG tablet TAKE 1 TABLET BY MOUTH DAILY WITH BREAKFAST 90 tablet 1  . clopidogrel (PLAVIX) 75 MG tablet TAKE 1 TABLET BY MOUTH DAILY WITH BREAKFAST 90 tablet 0  . loratadine (CLARITIN) 10 MG tablet Take 10 mg by mouth continuous as needed for allergies.    Marland Kitchen  losartan (COZAAR) 50 MG tablet TAKE 1 TABLET BY MOUTH DAILY 90 tablet 1  . losartan (COZAAR) 50 MG tablet TAKE 1 TABLET BY MOUTH DAILY 90 tablet 0  . meclizine (ANTIVERT) 25 MG tablet Take 1 tablet (25 mg total) by mouth 3 (three) times daily as needed for dizziness. 30 tablet 0  . Naproxen Sodium (ALEVE PO) Take by mouth continuous as needed.    Marland Kitchen omeprazole (PRILOSEC) 10 MG capsule Take 1 capsule (10 mg total) by mouth daily. Patient is not sure of doseage- 20mg  qday otc 30 capsule 11  . simvastatin (ZOCOR) 20 MG tablet Take 1 tablet (20 mg total) by mouth daily. 90 tablet 1   No current facility-administered medications for this visit.     Review of Systems : See HPI for pertinent  positives and negatives.  Physical Examination  Vitals:   11/29/17 1140 11/29/17 1146  BP: (!) 175/79 (!) 162/94  Pulse: 62   Temp: (!) 97.3 F (36.3 C)   SpO2: 98%   Weight: 219 lb 8 oz (99.6 kg)   Height: 5\' 4"  (1.626 m)    Body mass index is 37.68 kg/m.  General: WDWN  obese female in NAD GAIT: circumducted Eyes: PERRLA HENT: No gross abnormalities.  Pulmonary:  Respirations are non-labored, good air movement, CTAB, no rales, rhonchi, or wheezing. Cardiac: regular rhythm with rare premature contraction, + murmur.  VASCULAR EXAM Carotid Bruits Right Left   Transmitted cardiac murmur Transmitted cardiac miurmur     Abdominal aortic pulse is not palpable. Radial pulses are 2+ palpable and equal.                                                                                                                            LE Pulses Right Left       POPLITEAL  not palpable   not palpable       POSTERIOR TIBIAL  2+ palpable   2+ palpable        DORSALIS PEDIS      ANTERIOR TIBIAL not palpable  not palpable    Gastrointestinal: soft, nontender, BS WNL, no r/g, no palpable masses. Musculoskeletal: no muscle atrophy/wasting. M/S 5/5 throughout, extremities without ischemic changes. Skin: No rashes, no ulcers, no cellulitis.   Neurologic:  A&O X 3; appropriate affect, sensation is normal; speech is normal, CN 2-12 intact, pain and light touch intact in extremities, motor exam as listed above. Psychiatric: Normal thought content, mood appropriate to clinical situation.    Assessment: Ellen Baker is a 67 y.o. female who is s/p R CEA (Date: 01/19/13).  She had a TIA July, 2014, as manifested by transient left side diminished sensation and left hemiparesis, was evaluated at [facility], then had the rightCEA.   She denies any subsequent stroke or TIA activity.   Her atherosclerotic risk factors include obesity, controlled hypertension, and dyslipidemia.  Fortunately she does not  carry a diagnosis of DM and has never used  tobacco.  She takes Plavix and a statin.   DATA Carotid Duplex (11-29-17): Right ICA: CEA site with 1-39% stenosis Left ICA:  40-59% stenosis  Bilateral vertebral artery flow is antegrade.  Bilateral subclavian artery waveforms are normal.  No change compared to the exam on 11-27-16.    Plan:  Follow-up in 1yearwith Carotid Duplex scan.  I discussed in depth with the patient the nature of atherosclerosis, and emphasized the importance of maximal medical management including strict control of blood pressure, blood glucose, and lipid levels, obtaining regular exercise, and continued cessation of smoking.  The patient is aware that without maximal medical management the underlying atherosclerotic disease process will progress, limiting the benefit of any interventions. The patient was given information about stroke prevention and what symptoms should prompt the patient to seek immediate medical care. Thank you for allowing [place] to participate in this patient's care.  Clemon Chambers, RN, MSN, FNP-C [facility] Office: [phone]  Clinic Physician: Donzetta Matters  11/29/17 12:19 PM

## 2017-11-29 NOTE — Patient Instructions (Signed)

## 2017-12-05 ENCOUNTER — Ambulatory Visit (INDEPENDENT_AMBULATORY_CARE_PROVIDER_SITE_OTHER): Payer: PPO | Admitting: Family Medicine

## 2017-12-05 ENCOUNTER — Ambulatory Visit: Payer: Self-pay

## 2017-12-05 ENCOUNTER — Encounter: Payer: Self-pay | Admitting: Family Medicine

## 2017-12-05 VITALS — BP 140/80 | HR 86 | Ht 64.0 in | Wt 220.0 lb

## 2017-12-05 DIAGNOSIS — Z23 Encounter for immunization: Secondary | ICD-10-CM | POA: Diagnosis not present

## 2017-12-05 DIAGNOSIS — I679 Cerebrovascular disease, unspecified: Secondary | ICD-10-CM

## 2017-12-05 DIAGNOSIS — I10 Essential (primary) hypertension: Secondary | ICD-10-CM | POA: Diagnosis not present

## 2017-12-05 DIAGNOSIS — E785 Hyperlipidemia, unspecified: Secondary | ICD-10-CM

## 2017-12-05 DIAGNOSIS — K219 Gastro-esophageal reflux disease without esophagitis: Secondary | ICD-10-CM

## 2017-12-05 MED ORDER — SIMVASTATIN 20 MG PO TABS: 20.0000 mg | ORAL_TABLET | Freq: Every day | ORAL | 1 refills | Status: DC

## 2017-12-05 MED ORDER — CLOPIDOGREL BISULFATE 75 MG PO TABS: ORAL_TABLET | ORAL | 1 refills | Status: DC

## 2017-12-05 MED ORDER — LOSARTAN POTASSIUM 50 MG PO TABS: ORAL_TABLET | ORAL | 1 refills | Status: DC

## 2017-12-05 MED ORDER — OMEPRAZOLE 10 MG PO CPDR: 10.0000 mg | DELAYED_RELEASE_CAPSULE | Freq: Every day | ORAL | 11 refills | Status: DC

## 2017-12-05 NOTE — Progress Notes (Signed)
Name: Ellen Baker   MRN: [id number]    DOB: [birth year]   Date:12/05/2017       Progress Note  Subjective  Chief Complaint  Chief Complaint  Patient presents with  . Hyperlipidemia  . Hypertension  . cerebral vascular insufficiency    refill plavix    Hyperlipidemia  This is a chronic problem. The current episode started more than 1 year ago. The problem is controlled. Recent lipid tests were reviewed and are normal. She has no history of chronic renal disease, diabetes, hypothyroidism, liver disease, obesity or nephrotic syndrome. There are no known factors aggravating her hyperlipidemia. Pertinent negatives include no chest pain, focal sensory loss, focal weakness, leg pain, myalgias or shortness of breath. Current antihyperlipidemic treatment includes statins. The current treatment provides moderate improvement of lipids. There are no compliance problems.  Risk factors for coronary artery disease include dyslipidemia, hypertension and obesity.  Hypertension  This is a chronic problem. The current episode started more than 1 year ago. The problem has been waxing and waning since onset. The problem is controlled. Pertinent negatives include no anxiety, blurred vision, chest pain, headaches, malaise/fatigue, neck pain, orthopnea, palpitations, peripheral edema, PND, shortness of breath or sweats. There are no associated agents to hypertension. There are no known risk factors for coronary artery disease. Past treatments include angiotensin blockers. The current treatment provides moderate improvement. Hypertensive end-organ damage includes CVA. There is no history of angina, kidney disease, CAD/MI, heart failure, left ventricular hypertrophy, PVD or retinopathy. There is no history of chronic renal disease, a hypertension causing med or renovascular disease.  Neurologic Problem  The patient's pertinent negatives include no focal sensory loss or focal weakness. Primary symptoms comment: hx of  cva/cerebral vascular disease.. This is a chronic problem. The current episode started more than 1 year ago. The problem is unchanged (stable). Pertinent negatives include no abdominal pain, back pain, chest pain, confusion, dizziness, fever, headaches, nausea, neck pain, palpitations or shortness of breath. There is no history of liver disease.    Dyslipidemia Continue simvastatin  HTN (hypertension) Continue losartan   Past Medical History:  Diagnosis Date  . Arthritis    right knee  . Carotid artery occlusion   . GERD (gastroesophageal reflux disease)   . Heart murmur   . Hyperlipidemia   . Hypertension   . Stroke [facility]) 12-31-12   no residual effects    Past Surgical History:  Procedure Laterality Date  . CAROTID ENDARTERECTOMY     RIGHT  01/19/13  . COLONOSCOPY  08-18-12  . ENDARTERECTOMY Right 01/19/2013   Procedure: ENDARTERECTOMY CAROTID with patch angioplasty;  Surgeon: Conrad Saco, MD;  Location: [place];  Service: Vascular;  Laterality: Right;  . ESOPHAGEAL DILATION    . TUBAL LIGATION    . UPPER GI ENDOSCOPY  08-18-12    Family History  Problem Relation Age of Onset  . Stroke Mother   . Deep vein thrombosis Mother   . Heart disease Mother        Amputation-  . Hypertension Mother   . Alzheimer's disease Mother   . Heart failure Father   . Heart disease Father        CF  . Hypertension Father   . Varicose Veins Father   . Parkinson's disease Father     Social History   Socioeconomic History  . Marital status: Widowed    Spouse name: Not on file  . Number of children: Not on file  . Years of  education: Not on file  . Highest education level: Not on file  Occupational History  . Not on file  Social Needs  . Financial resource strain: Not on file  . Food insecurity:    Worry: Not on file    Inability: Not on file  . Transportation needs:    Medical: Not on file    Non-medical: Not on file  Tobacco Use  . Smoking status: Never Smoker  . Smokeless  tobacco: Never Used  Substance and Sexual Activity  . Alcohol use: No    Alcohol/week: 0.0 oz  . Drug use: No  . Sexual activity: Never  Lifestyle  . Physical activity:    Days per week: Not on file    Minutes per session: Not on file  . Stress: Not on file  Relationships  . Social connections:    Talks on phone: Not on file    Gets together: Not on file    Attends religious service: Not on file    Active member of club or organization: Not on file    Attends meetings of clubs or organizations: Not on file    Relationship status: Not on file  . Intimate partner violence:    Fear of current or ex partner: Not on file    Emotionally abused: Not on file    Physically abused: Not on file    Forced sexual activity: Not on file  Other Topics Concern  . Not on file  Social History Narrative  . Not on file    Allergies  Allergen Reactions  . Adhesive [Tape] Rash    Skin became RAW    Outpatient Medications Prior to Visit  Medication Sig Dispense Refill  . Biotin w/ Vitamins C & E (HAIR SKIN & NAILS GUMMIES PO) Take 2 each by mouth daily.    . Calcium Carb-Ergocalciferol (CHEWABLE CALCIUM/D PO) Take 1 tablet by mouth daily.    Marland Kitchen loratadine (CLARITIN) 10 MG tablet Take 10 mg by mouth continuous as needed for allergies.    Marland Kitchen meclizine (ANTIVERT) 25 MG tablet Take 1 tablet (25 mg total) by mouth 3 (three) times daily as needed for dizziness. 30 tablet 0  . Naproxen Sodium (ALEVE PO) Take by mouth continuous as needed.    . clopidogrel (PLAVIX) 75 MG tablet TAKE 1 TABLET BY MOUTH DAILY WITH BREAKFAST 90 tablet 1  . losartan (COZAAR) 50 MG tablet TAKE 1 TABLET BY MOUTH DAILY 90 tablet 1  . omeprazole (PRILOSEC) 10 MG capsule Take 1 capsule (10 mg total) by mouth daily. Patient is not sure of doseage- 20mg  qday otc 30 capsule 11  . simvastatin (ZOCOR) 20 MG tablet Take 1 tablet (20 mg total) by mouth daily. 90 tablet 1  . clopidogrel (PLAVIX) 75 MG tablet TAKE 1 TABLET BY MOUTH DAILY  WITH BREAKFAST 90 tablet 0  . losartan (COZAAR) 50 MG tablet TAKE 1 TABLET BY MOUTH DAILY 90 tablet 0   No facility-administered medications prior to visit.     Review of Systems  Constitutional: Negative for chills, fever, malaise/fatigue and weight loss.  HENT: Negative for ear discharge, ear pain and sore throat.   Eyes: Negative for blurred vision.  Respiratory: Negative for cough, sputum production, shortness of breath and wheezing.   Cardiovascular: Negative for chest pain, palpitations, orthopnea, leg swelling and PND.  Gastrointestinal: Negative for abdominal pain, blood in stool, constipation, diarrhea, heartburn, melena and nausea.  Genitourinary: Negative for dysuria, frequency, hematuria and urgency.  Musculoskeletal:  Negative for back pain, joint pain, myalgias and neck pain.  Skin: Negative for rash.  Neurological: Negative for dizziness, tingling, sensory change, focal weakness and headaches.  Endo/Heme/Allergies: Negative for environmental allergies and polydipsia. Does not bruise/bleed easily.  Psychiatric/Behavioral: Negative for confusion, depression and suicidal ideas. The patient is not nervous/anxious and does not have insomnia.      Objective  Vitals:   12/05/17 0902  BP: 140/80  Pulse: 86  Weight: 220 lb (99.8 kg)  Height: 5\' 4"  (1.626 m)    Physical Exam  Constitutional: No distress.  HENT:  Head: Normocephalic and atraumatic.  Right Ear: External ear normal.  Left Ear: External ear normal.  Nose: Nose normal.  Mouth/Throat: Oropharynx is clear and moist.  Eyes: Pupils are equal, round, and reactive to light. Conjunctivae and EOM are normal. Right eye exhibits no discharge. Left eye exhibits no discharge.  Neck: Normal range of motion. Neck supple. Normal carotid pulses, no hepatojugular reflux and no JVD present. Carotid bruit is not present. No thyromegaly present.  Cardiovascular: Normal rate, regular rhythm, S1 normal, S2 normal and intact distal  pulses. PMI is not displaced. Exam reveals no gallop, no S3, no S4 and no friction rub.  Murmur heard.  Systolic murmur is present with a grade of 1/6.  No diastolic murmur is present. Pulses:      Carotid pulses are 2+ on the right side, and 2+ on the left side.      Radial pulses are 2+ on the right side, and 2+ on the left side.       Femoral pulses are 2+ on the right side, and 2+ on the left side.      Popliteal pulses are 2+ on the right side, and 2+ on the left side.       Dorsalis pedis pulses are 2+ on the right side, and 2+ on the left side.       Posterior tibial pulses are 2+ on the right side, and 2+ on the left side.  Pulmonary/Chest: Effort normal and breath sounds normal. She has no wheezes. She has no rales.  Abdominal: Soft. Bowel sounds are normal. She exhibits no mass. There is no tenderness. There is no guarding.  Musculoskeletal: Normal range of motion. She exhibits no edema.  Lymphadenopathy:    She has no cervical adenopathy.  Neurological: She is alert. She has normal strength and normal reflexes. No cranial nerve deficit or sensory deficit.  Reflex Scores:      Tricep reflexes are 2+ on the right side and 2+ on the left side.      Bicep reflexes are 2+ on the right side and 2+ on the left side.      Brachioradialis reflexes are 2+ on the right side and 2+ on the left side.      Patellar reflexes are 2+ on the right side and 2+ on the left side.      Achilles reflexes are 2+ on the right side and 2+ on the left side. Skin: Skin is warm and dry. She is not diaphoretic.      Assessment & Plan  Problem List Items Addressed This Visit      Cardiovascular and Mediastinum   Cerebral vascular insufficiency - Primary   Relevant Medications   simvastatin (ZOCOR) 20 MG tablet   losartan (COZAAR) 50 MG tablet   clopidogrel (PLAVIX) 75 MG tablet   HTN (hypertension)    Continue losartan      Relevant  Medications   simvastatin (ZOCOR) 20 MG tablet   losartan  (COZAAR) 50 MG tablet   Other Relevant Orders   Renal Function Panel     Digestive   GERD (gastroesophageal reflux disease)   Relevant Medications   omeprazole (PRILOSEC) 10 MG capsule     Other   Dyslipidemia    Continue simvastatin      Relevant Medications   simvastatin (ZOCOR) 20 MG tablet   Other Relevant Orders   Lipid panel    Other Visit Diagnoses    Need for 23-polyvalent pneumococcal polysaccharide vaccine       pneum 23 given   Relevant Orders   Pneumococcal polysaccharide vaccine 23-valent greater than or equal to 2yo subcutaneous/IM (Completed)      Meds ordered this encounter  Medications  . DISCONTD: clopidogrel (PLAVIX) 75 MG tablet    Sig: TAKE 1 TABLET BY MOUTH DAILY WITH BREAKFAST    Dispense:  90 tablet    Refill:  1  . simvastatin (ZOCOR) 20 MG tablet    Sig: Take 1 tablet (20 mg total) by mouth daily.    Dispense:  90 tablet    Refill:  1  . omeprazole (PRILOSEC) 10 MG capsule    Sig: Take 1 capsule (10 mg total) by mouth daily. Patient is not sure of doseage- 20mg  qday otc    Dispense:  30 capsule    Refill:  11  . DISCONTD: losartan (COZAAR) 50 MG tablet    Sig: TAKE 1 TABLET BY MOUTH DAILY    Dispense:  90 tablet    Refill:  1  . losartan (COZAAR) 50 MG tablet    Sig: TAKE 1 TABLET BY MOUTH DAILY    Dispense:  90 tablet    Refill:  1  . clopidogrel (PLAVIX) 75 MG tablet    Sig: TAKE 1 TABLET BY MOUTH DAILY WITH BREAKFAST    Dispense:  90 tablet    Refill:  1      Dr. Sharrod [facility]  12/05/17

## 2017-12-05 NOTE — Assessment & Plan Note (Signed)
Continue simvastatin. 

## 2017-12-05 NOTE — Assessment & Plan Note (Signed)
Continue losartan. 

## 2017-12-06 LAB — LIPID PANEL
CHOL/HDL RATIO: 2.6 ratio (ref 0.0–4.4)
CHOLESTEROL TOTAL: 165 mg/dL (ref 100–199)
HDL: 64 mg/dL (ref >39)
LDL CALC: 86 mg/dL (ref 0–99)
Triglycerides: 74 mg/dL (ref 0–149)
VLDL Cholesterol Cal: 15 mg/dL (ref 5–40)

## 2017-12-06 LAB — RENAL FUNCTION PANEL
Albumin: 4 g/dL (ref 3.6–4.8)
BUN / CREAT RATIO: 16 (ref 12–28)
BUN: 12 mg/dL (ref 8–27)
CO2: 24 mmol/L (ref 20–29)
Calcium: 9 mg/dL (ref 8.7–10.3)
Chloride: 103 mmol/L (ref 96–106)
Creatinine, Ser: 0.75 mg/dL (ref 0.57–1.00)
GFR calc Af Amer: 96 mL/min/{1.73_m2} (ref >59)
GFR, EST NON AFRICAN AMERICAN: 83 mL/min/{1.73_m2} (ref >59)
Glucose: 78 mg/dL (ref 65–99)
PHOSPHORUS: 3 mg/dL (ref 2.5–4.5)
Potassium: 4.3 mmol/L (ref 3.5–5.2)
SODIUM: 143 mmol/L (ref 134–144)

## 2017-12-09 ENCOUNTER — Ambulatory Visit (INDEPENDENT_AMBULATORY_CARE_PROVIDER_SITE_OTHER): Payer: PPO

## 2017-12-09 VITALS — BP 148/76 | HR 100 | Temp 97.9°F | Resp 12 | Ht 64.0 in | Wt 220.6 lb

## 2017-12-09 DIAGNOSIS — M810 Age-related osteoporosis without current pathological fracture: Secondary | ICD-10-CM

## 2017-12-09 DIAGNOSIS — Z1231 Encounter for screening mammogram for malignant neoplasm of breast: Secondary | ICD-10-CM

## 2017-12-09 DIAGNOSIS — E2839 Other primary ovarian failure: Secondary | ICD-10-CM | POA: Diagnosis not present

## 2017-12-09 DIAGNOSIS — Z0000 Encounter for general adult medical examination without abnormal findings: Secondary | ICD-10-CM | POA: Diagnosis not present

## 2017-12-09 DIAGNOSIS — Z1211 Encounter for screening for malignant neoplasm of colon: Secondary | ICD-10-CM

## 2017-12-09 DIAGNOSIS — Z1239 Encounter for other screening for malignant neoplasm of breast: Secondary | ICD-10-CM

## 2017-12-09 NOTE — Progress Notes (Signed)
Subjective:   Ellen Baker is a 67 y.o. female who presents for an Initial Medicare Annual Wellness Visit.  Review of Systems    N/A  Cardiac Risk Factors include: dyslipidemia;hypertension;advanced age (>25men, >53 women);obesity (BMI >30kg/m2);sedentary lifestyle     Objective:    Today's Vitals   12/09/17 0816  BP: (!) 148/76  Pulse: 100  Resp: 12  Temp: 97.9 F (36.6 C)  TempSrc: Oral  SpO2: 96%  Weight: 220 lb 9.6 oz (100.1 kg)  Height: 5\' 4"  (1.626 m)   Body mass index is 37.87 kg/m.  Advanced Directives 12/09/2017 11/29/2017 04/29/2017 11/27/2016 02/24/2016 02/18/2015 12/03/2014  Does Patient Have a Medical Advance Directive? No Yes No No No No No  Type of Advance Directive - Living will - - - - -  Does patient want to make changes to medical advance directive? Yes (MAU/Ambulatory/Procedural Areas - Information given) - - - - - -  Would patient like information on creating a medical advance directive? - - No - Patient declined - No - patient declined information No - patient declined information No - patient declined information  Pre-existing out of facility DNR order (yellow form or pink MOST form) - - - - - - -    Current Medications (verified) Outpatient Encounter Medications as of 12/09/2017  Medication Sig  . Biotin w/ Vitamins C & E (HAIR SKIN & NAILS GUMMIES PO) Take 2 each by mouth daily.  . Calcium Carb-Ergocalciferol (CHEWABLE CALCIUM/D PO) Take 1 tablet by mouth daily.  . clopidogrel (PLAVIX) 75 MG tablet TAKE 1 TABLET BY MOUTH DAILY WITH BREAKFAST  . loratadine (CLARITIN) 10 MG tablet Take 10 mg by mouth continuous as needed for allergies.  Marland Kitchen losartan (COZAAR) 50 MG tablet TAKE 1 TABLET BY MOUTH DAILY  . meclizine (ANTIVERT) 25 MG tablet Take 1 tablet (25 mg total) by mouth 3 (three) times daily as needed for dizziness.  . Naproxen Sodium (ALEVE PO) Take by mouth continuous as needed.  Marland Kitchen omeprazole (PRILOSEC) 10 MG capsule Take 1 capsule (10 mg total) by  mouth daily. Patient is not sure of doseage- 20mg  qday otc  . simvastatin (ZOCOR) 20 MG tablet Take 1 tablet (20 mg total) by mouth daily.   No facility-administered encounter medications on file as of 12/09/2017.     Allergies (verified) Adhesive [tape]   History: Past Medical History:  Diagnosis Date  . Arthritis    right knee  . Carotid artery occlusion   . GERD (gastroesophageal reflux disease)   . Heart murmur   . Hyperlipidemia   . Hypertension   . Stroke [facility]) 12-31-12   no residual effects   Past Surgical History:  Procedure Laterality Date  . CAROTID ENDARTERECTOMY     RIGHT  01/19/13  . COLONOSCOPY  08-18-12  . ENDARTERECTOMY Right 01/19/2013   Procedure: ENDARTERECTOMY CAROTID with patch angioplasty;  Surgeon: Conrad Blacksburg, MD;  Location: [place];  Service: Vascular;  Laterality: Right;  . ESOPHAGEAL DILATION    . TUBAL LIGATION    . UPPER GI ENDOSCOPY  08-18-12   Family History  Problem Relation Age of Onset  . Stroke Mother   . Deep vein thrombosis Mother   . Heart disease Mother        Amputation-  . Hypertension Mother   . Alzheimer's disease Mother   . Heart failure Father   . Heart disease Father        CHF  . Hypertension Father   .  Varicose Veins Father   . Parkinson's disease Father    Social History   Socioeconomic History  . Marital status: Widowed    Spouse name: Not on file  . Number of children: 3  . Years of education: Not on file  . Highest education level: 12th grade  Occupational History    Employer: [employer]  . Financial resource strain: Not hard at all  . Food insecurity:    Worry: Never true    Inability: Never true  . Transportation needs:    Medical: No    Non-medical: No  Tobacco Use  . Smoking status: Never Smoker  . Smokeless tobacco: Never Used  . Tobacco comment: Smoking cessation materials not required  Substance and Sexual Activity  . Alcohol use: No    Alcohol/week: 0.0 oz  . Drug use: No  .  Sexual activity: Never  Lifestyle  . Physical activity:    Days per week: 0 days    Minutes per session: 0 min  . Stress: Not at all  Relationships  . Social connections:    Talks on phone: Patient refused    Gets together: Patient refused    Attends religious service: Patient refused    Active member of club or organization: Patient refused    Attends meetings of clubs or organizations: Patient refused    Relationship status: Widowed  Other Topics Concern  . Not on file  Social History Narrative  . Not on file    Tobacco Counseling Counseling given: No Comment: Smoking cessation materials not required  Clinical Intake:  Pre-visit preparation completed: Yes  Pain : No/denies pain   BMI - recorded: 37.68 Nutritional Status: BMI > 30  Obese Nutritional Risks: None Diabetes: No  How often do you need to have someone help you when you read instructions, pamphlets, or other written materials from your doctor or pharmacy?: 1 - Never  Interpreter Needed?: No  Information entered by :: AEversole, LPN   Activities of Daily Living In your present state of health, do you have any difficulty performing the following activities: 12/09/2017  Hearing? N  Comment denies hearing aids  Vision? N  Comment wears eyeglasses  Difficulty concentrating or making decisions? N  Walking or climbing stairs? Y  Comment dyspnea, jont pain  Dressing or bathing? N  Doing errands, shopping? N  Preparing Food and eating ? N  Comment denies dentures  Using the Toilet? N  In the past six months, have you accidently leaked urine? N  Do you have problems with loss of bowel control? N  Managing your Medications? N  Managing your Finances? N  Housekeeping or managing your Housekeeping? N  Some recent data might be hidden     Immunizations and Health Maintenance Immunization History  Administered Date(s) Administered  . Pneumococcal Conjugate-13 06/05/2016  . Pneumococcal Polysaccharide-23  12/05/2017   There are no preventive care reminders to display for this patient.  Patient Care Team: Juline Patch, MD as PCP - General (Family Medicine) Lollie Sails, MD as Consulting Physician (Gastroenterology) Reche Dixon, PA-C (Orthopedic Surgery) Leanor Kail, MD as Consulting Physician (Orthopedic Surgery)  Dalton, Okey Regal, MD as Consulting Physician (Unknown Physician Specialty)  Indicate any recent Medical Services you may have received from other than [facility] providers in the past year (date may be approximate).     Assessment:   This is a routine wellness examination for Ellen Baker.  Hearing/Vision screen Vision Screening Comments: [place]  for annual eye exams  Dietary issues and exercise activities discussed: Current Exercise Habits: The patient does not participate in regular exercise at present, Exercise limited by: orthopedic condition(s)(recent knee replacement)  Goals    . DIET - INCREASE WATER INTAKE     Recommend to drink at least 6-8 8oz glasses of water per day.      Depression Screen PHQ 2/9 Scores 12/09/2017 12/05/2017 12/04/2016 12/05/2015  PHQ - 2 Score 0 0 0 0  PHQ- 9 Score 0 - - -    Fall Risk Fall Risk  12/09/2017 12/05/2017 12/04/2016 12/05/2015  Falls in the past year? No No No No  Risk for fall due to : Impaired vision;Impaired balance/gait;History of fall(s) - - -  Risk for fall due to: Comment wears eyeglasses; recent knee replacement - - -    FALL RISK PREVENTION PERTAINING TO HOME: Is your home free of loose throw rugs in walkways, pet beds, electrical cords, etc? Yes Is there adequate lighting in your home to reduce risk of falls?  Yes Are there stairs in or around your home WITH handrails? Yes  ASSISTIVE DEVICES UTILIZED TO PREVENT FALLS: Use of a cane, walker or w/c? Yes, cane Grab bars in the bathroom? No  Shower chair or a place to sit while bathing? Yes An elevated toilet seat or a handicapped toilet?  Yes  Timed Get Up and Go Performed: Yes. Pt ambulated 10 feet within 25 sec. Gait slow, steady and without the use of an assistive device. No intervention required at this time. Fall risk prevention has been discussed.  Community Resource Referral:  Pt declined my offer to send [employer] Referral to Care Guide for installation of grab bars in the shower.  Cognitive Function:     6CIT Screen 12/09/2017  What Year? 0 points  What month? 0 points  What time? 0 points  Count back from 20 0 points  Months in reverse 0 points  Repeat phrase 0 points  Total Score 0    Screening Tests Health Maintenance  Topic Date Due  . MAMMOGRAM  06/05/2018 (Originally 02/24/2001)  . COLONOSCOPY  06/05/2018 (Originally 02/24/2001)  . TETANUS/TDAP  12/10/2018 (Originally 02/24/1970)  . Hepatitis C Screening  06/01/2019 (Originally 1951/05/12)  . INFLUENZA VACCINE  01/16/2018  . DEXA SCAN  Completed  . PNA vac Low Risk Adult  Completed    Qualifies for Shingles Vaccine? Yes. Due for Shingrix. Education has been provided regarding the importance of this vaccine. Pt has been advised to call her insurance company to determine her out of pocket expense. Advised she may also receive this vaccine at her local pharmacy or Health Dept. Verbalized acceptance and understanding.  Due for Tdap vaccine. Education has been provided regarding the importance of this vaccine. Pt has been advised she may receive this vaccine at her local pharmacy or Health Dept. Also advised to provide a copy of her vaccination record if she chooses to receive this vaccine at her local pharmacy. Verbalized acceptance and understanding.  Cancer Screenings: Lung: Low Dose CT Chest recommended if Age 52-80 years, 30 pack-year currently smoking OR have quit w/in 15years. Patient does not qualify. Breast: Up to date on Mammogram? No. Completed 08/21/11. Repeat every year. Order placed today for completion. Message sent to referral coordinator  for scheduling purposes. Up to date of Bone Density/Dexa? No. Completed 04/25/10. Results indicated osteoporosis. Order placed today for completion. Message sent to referral coordinator for scheduling purposes. Colorectal: Unable to locate report. Pt  requesting to be referred to Dr. Gustavo Lah for completion. Referral placed today and message sent to referral coordinator for scheduling purposes.  Additional Screenings: Hepatitis C Screening: Requesting to complete in December along with other routine lab work.   Plan:  I have personally reviewed and addressed the Medicare Annual Wellness questionnaire and have noted the following in the patient's chart:  A. Medical and social history B. Use of alcohol, tobacco or illicit drugs  C. Current medications and supplements D. Functional ability and status E.  Nutritional status F.  Physical activity G. Advance directives H. List of other physicians I.  Hospitalizations, surgeries, and ER visits in previous 12 months J.  [place] such as hearing and vision if needed, cognitive and depression L. Referrals and appointments  In addition, I have reviewed and discussed with patient certain preventive protocols, quality metrics, and best practice recommendations. A written personalized care plan for preventive services as well as general preventive health recommendations were provided to patient.  Signed,  Aleatha Borer, LPN Nurse Health Advisor  MD Recommendations: Due for Shingrix. Education has been provided regarding the importance of this vaccine. Pt has been advised to call her insurance company to determine her out of pocket expense. Advised she may also receive this vaccine at her local pharmacy or Health Dept. Verbalized acceptance and understanding.  Due for Tdap vaccine. Education has been provided regarding the importance of this vaccine. Pt has been advised she may receive this vaccine at her local pharmacy or Health Dept. Also  advised to provide a copy of her vaccination record if she chooses to receive this vaccine at her local pharmacy. Verbalized acceptance and understanding.  Due for Mammogram: Completed 08/21/11. Repeat every year. Order placed today for completion. Message sent to referral coordinator for scheduling purposes.  Due for Bone Density/Dexa: Completed 04/25/10. Results indicated osteoporosis. Order placed today for completion. Message sent to referral coordinator for scheduling purposes.  Colorectal: Unable to locate report. Pt requesting to be referred to Dr. Gustavo Lah for completion. Referral placed today and message sent to referral coordinator for scheduling purposes.  Hepatitis C Screening: Requesting to complete in December along with other routine lab work.

## 2017-12-09 NOTE — Patient Instructions (Signed)
Ellen Baker , Thank you for taking time to come for your Medicare Wellness Visit. I appreciate your ongoing commitment to your health goals. Please review the following plan we discussed and let me know if I can assist you in the future.   Screening recommendations/referrals: Colorectal Screening: You will receive a call from our office regarding your appointment Mammogram: You will receive a call from our office regarding your appointment Bone Density: You will receive a call from our office regarding your appointment  Vision and Dental Exams: Recommended annual ophthalmology exams for early detection of glaucoma and other disorders of the eye Recommended annual dental exams for proper oral hygiene  Vaccinations: Influenza vaccine: Up to date Pneumococcal vaccine: Up to date Tdap vaccine: Declined. Please call your insurance company to determine your out of pocket expense. You may also receive this vaccine at your local pharmacy or Health Dept. Shingles vaccine: Please call your insurance company to determine your out of pocket expense for the Shingrix vaccine. You may also receive this vaccine at your local pharmacy or Health Dept.  Advanced directives: Advance directive discussed with you today. I have provided a copy for you to complete at home and have notarized. Once this is complete please bring a copy in to our office so we can scan it into your chart.  Goals: Recommend to drink at least 6-8 8oz glasses of water per day.  Next appointment: Please schedule your Annual Wellness Visit with your Nurse Health Advisor in one year.  Preventive Care 38 Years and Older, Female Preventive care refers to lifestyle choices and visits with your health care provider that can promote health and wellness. What does preventive care include?  A yearly physical exam. This is also called an annual well check.  Dental exams once or twice a year.  Routine eye exams. Ask your health care provider how  often you should have your eyes checked.  Personal lifestyle choices, including:  Daily care of your teeth and gums.  Regular physical activity.  Eating a healthy diet.  Avoiding tobacco and drug use.  Limiting alcohol use.  Practicing safe sex.  Taking low-dose aspirin every day.  Taking vitamin and mineral supplements as recommended by your health care provider. What happens during an annual well check? The services and screenings done by your health care provider during your annual well check will depend on your age, overall health, lifestyle risk factors, and family history of disease. Counseling  Your health care provider may ask you questions about your:  Alcohol use.  Tobacco use.  Drug use.  Emotional well-being.  Home and relationship well-being.  Sexual activity.  Eating habits.  History of falls.  Memory and ability to understand (cognition).  Work and work Statistician.  Reproductive health. Screening  You may have the following tests or measurements:  Height, weight, and BMI.  Blood pressure.  Lipid and cholesterol levels. These may be checked every 5 years, or more frequently if you are over 73 years old.  Skin check.  Lung cancer screening. You may have this screening every year starting at age 49 if you have a 30-pack-year history of smoking and currently smoke or have quit within the past 15 years.  Fecal occult blood test (FOBT) of the stool. You may have this test every year starting at age 57.  Flexible sigmoidoscopy or colonoscopy. You may have a sigmoidoscopy every 5 years or a colonoscopy every 10 years starting at age 56.  Hepatitis C blood test.  Hepatitis B blood test.  Sexually transmitted disease (STD) testing.  Diabetes screening. This is done by checking your blood sugar (glucose) after you have not eaten for a while (fasting). You may have this done every 1-3 years.  Bone density scan. This is done to screen for  osteoporosis. You may have this done starting at age 57.  Mammogram. This may be done every 1-2 years. Talk to your health care provider about how often you should have regular mammograms. Talk with your health care provider about your test results, treatment options, and if necessary, the need for more tests. Vaccines  Your health care provider may recommend certain vaccines, such as:  Influenza vaccine. This is recommended every year.  Tetanus, diphtheria, and acellular pertussis (Tdap, Td) vaccine. You may need a Td booster every 10 years.  Zoster vaccine. You may need this after age 78.  Pneumococcal 13-valent conjugate (PCV13) vaccine. One dose is recommended after age 45.  Pneumococcal polysaccharide (PPSV23) vaccine. One dose is recommended after age 19. Talk to your health care provider about which screenings and vaccines you need and how often you need them. This information is not intended to replace advice given to you by your health care provider. Make sure you discuss any questions you have with your health care provider. Document Released: 07/01/2015 Document Revised: 02/22/2016 Document Reviewed: 04/05/2015 Elsevier Interactive Patient Education  2017 [employer] Prevention in the Home Falls can cause injuries. They can happen to people of all ages. There are many things you can do to make your home safe and to help prevent falls. What can I do on the outside of my home?  Regularly fix the edges of walkways and driveways and fix any cracks.  Remove anything that might make you trip as you walk through a door, such as a raised step or threshold.  Trim any bushes or trees on the path to your home.  Use bright outdoor lighting.  Clear any walking paths of anything that might make someone trip, such as rocks or tools.  Regularly check to see if handrails are loose or broken. Make sure that both sides of any steps have handrails.  Any raised decks and porches  should have guardrails on the edges.  Have any leaves, snow, or ice cleared regularly.  Use sand or salt on walking paths during winter.  Clean up any spills in your garage right away. This includes oil or grease spills. What can I do in the bathroom?  Use night lights.  Install grab bars by the toilet and in the tub and shower. Do not use towel bars as grab bars.  Use non-skid mats or decals in the tub or shower.  If you need to sit down in the shower, use a plastic, non-slip stool.  Keep the floor dry. Clean up any water that spills on the floor as soon as it happens.  Remove soap buildup in the tub or shower regularly.  Attach bath mats securely with double-sided non-slip rug tape.  Do not have throw rugs and other things on the floor that can make you trip. What can I do in the bedroom?  Use night lights.  Make sure that you have a light by your bed that is easy to reach.  Do not use any sheets or blankets that are too big for your bed. They should not hang down onto the floor.  Have a firm chair that has side arms. You can use this  for support while you get dressed.  Do not have throw rugs and other things on the floor that can make you trip. What can I do in the kitchen?  Clean up any spills right away.  Avoid walking on wet floors.  Keep items that you use a lot in easy-to-reach places.  If you need to reach something above you, use a strong step stool that has a grab bar.  Keep electrical cords out of the way.  Do not use floor polish or wax that makes floors slippery. If you must use wax, use non-skid floor wax.  Do not have throw rugs and other things on the floor that can make you trip. What can I do with my stairs?  Do not leave any items on the stairs.  Make sure that there are handrails on both sides of the stairs and use them. Fix handrails that are broken or loose. Make sure that handrails are as long as the stairways.  Check any carpeting to  make sure that it is firmly attached to the stairs. Fix any carpet that is loose or worn.  Avoid having throw rugs at the top or bottom of the stairs. If you do have throw rugs, attach them to the floor with carpet tape.  Make sure that you have a light switch at the top of the stairs and the bottom of the stairs. If you do not have them, ask someone to add them for you. What else can I do to help prevent falls?  Wear shoes that:  Do not have high heels.  Have rubber bottoms.  Are comfortable and fit you well.  Are closed at the toe. Do not wear sandals.  If you use a stepladder:  Make sure that it is fully opened. Do not climb a closed stepladder.  Make sure that both sides of the stepladder are locked into place.  Ask someone to hold it for you, if possible.  Clearly mark and make sure that you can see:  Any grab bars or handrails.  First and last steps.  Where the edge of each step is.  Use tools that help you move around (mobility aids) if they are needed. These include:  Canes.  Walkers.  Scooters.  Crutches.  Turn on the lights when you go into a dark area. Replace any light bulbs as soon as they burn out.  Set up your furniture so you have a clear path. Avoid moving your furniture around.  If any of your floors are uneven, fix them.  If there are any pets around you, be aware of where they are.  Review your medicines with your doctor. Some medicines can make you feel dizzy. This can increase your chance of falling. Ask your doctor what other things that you can do to help prevent falls. This information is not intended to replace advice given to you by your health care provider. Make sure you discuss any questions you have with your health care provider. Document Released: 03/31/2009 Document Revised: 11/10/2015 Document Reviewed: 07/09/2014 Elsevier Interactive Patient Education  2017 [employer].

## 2018-01-09 ENCOUNTER — Ambulatory Visit
Admission: RE | Admit: 2018-01-09 | Discharge: 2018-01-09 | Disposition: A | Discharge status: Home / Self Care | Payer: PPO | Source: Ambulatory Visit | Attending: Family Medicine | Admitting: Family Medicine

## 2018-01-09 DIAGNOSIS — M810 Age-related osteoporosis without current pathological fracture: Secondary | ICD-10-CM | POA: Insufficient documentation

## 2018-01-09 DIAGNOSIS — Z1231 Encounter for screening mammogram for malignant neoplasm of breast: Secondary | ICD-10-CM | POA: Insufficient documentation

## 2018-01-09 DIAGNOSIS — E2839 Other primary ovarian failure: Secondary | ICD-10-CM | POA: Diagnosis not present

## 2018-01-09 DIAGNOSIS — Z780 Asymptomatic menopausal state: Secondary | ICD-10-CM | POA: Diagnosis not present

## 2018-01-09 DIAGNOSIS — Z1239 Encounter for other screening for malignant neoplasm of breast: Secondary | ICD-10-CM

## 2018-01-10 ENCOUNTER — Other Ambulatory Visit: Payer: Self-pay | Admitting: *Deleted

## 2018-01-10 ENCOUNTER — Inpatient Hospital Stay
Admission: RE | Admit: 2018-01-10 | Discharge: 2018-01-10 | Disposition: A | Discharge status: Home / Self Care | Payer: Self-pay | Source: Ambulatory Visit | Attending: *Deleted | Admitting: *Deleted

## 2018-01-10 DIAGNOSIS — Z1231 Encounter for screening mammogram for malignant neoplasm of breast: Secondary | ICD-10-CM

## 2018-01-10 DIAGNOSIS — Z8673 Personal history of transient ischemic attack (TIA), and cerebral infarction without residual deficits: Secondary | ICD-10-CM | POA: Diagnosis not present

## 2018-01-10 DIAGNOSIS — Z86010 Personal history of colonic polyps: Secondary | ICD-10-CM | POA: Diagnosis not present

## 2018-01-31 DIAGNOSIS — H2513 Age-related nuclear cataract, bilateral: Secondary | ICD-10-CM | POA: Diagnosis not present

## 2018-02-04 DIAGNOSIS — M1711 Unilateral primary osteoarthritis, right knee: Secondary | ICD-10-CM | POA: Diagnosis not present

## 2018-02-04 DIAGNOSIS — Z96652 Presence of left artificial knee joint: Secondary | ICD-10-CM | POA: Insufficient documentation

## 2018-02-04 DIAGNOSIS — E669 Obesity, unspecified: Secondary | ICD-10-CM | POA: Diagnosis not present

## 2018-02-12 ENCOUNTER — Telehealth: Payer: Self-pay | Admitting: Family Medicine

## 2018-02-12 NOTE — Telephone Encounter (Signed)
Spoke with pt regarding questions regarding her recent statement for her Annual Wellness visit she will email a copy of it to me and I will share with Practice Manager to determine next steps. knb

## 2018-03-07 ENCOUNTER — Encounter: Payer: Self-pay | Admitting: *Deleted

## 2018-03-10 ENCOUNTER — Ambulatory Visit: Payer: PPO | Admitting: Certified Registered Nurse Anesthetist

## 2018-03-10 ENCOUNTER — Ambulatory Visit
Admission: RE | Admit: 2018-03-10 | Discharge: 2018-03-10 | Disposition: A | Discharge status: Home / Self Care | Payer: PPO | Source: Ambulatory Visit | Attending: Gastroenterology | Admitting: Gastroenterology

## 2018-03-10 ENCOUNTER — Encounter: Payer: Self-pay | Admitting: Certified Registered Nurse Anesthetist

## 2018-03-10 ENCOUNTER — Encounter
Admission: RE | Disposition: A | Discharge status: Home / Self Care | Payer: Self-pay | Source: Ambulatory Visit | Attending: Gastroenterology

## 2018-03-10 DIAGNOSIS — I10 Essential (primary) hypertension: Secondary | ICD-10-CM | POA: Insufficient documentation

## 2018-03-10 DIAGNOSIS — E785 Hyperlipidemia, unspecified: Secondary | ICD-10-CM | POA: Diagnosis not present

## 2018-03-10 DIAGNOSIS — Z8673 Personal history of transient ischemic attack (TIA), and cerebral infarction without residual deficits: Secondary | ICD-10-CM | POA: Insufficient documentation

## 2018-03-10 DIAGNOSIS — K219 Gastro-esophageal reflux disease without esophagitis: Secondary | ICD-10-CM | POA: Diagnosis not present

## 2018-03-10 DIAGNOSIS — Z888 Allergy status to other drugs, medicaments and biological substances status: Secondary | ICD-10-CM | POA: Insufficient documentation

## 2018-03-10 DIAGNOSIS — K635 Polyp of colon: Secondary | ICD-10-CM | POA: Diagnosis not present

## 2018-03-10 DIAGNOSIS — D126 Benign neoplasm of colon, unspecified: Secondary | ICD-10-CM | POA: Diagnosis not present

## 2018-03-10 DIAGNOSIS — D123 Benign neoplasm of transverse colon: Secondary | ICD-10-CM | POA: Diagnosis not present

## 2018-03-10 DIAGNOSIS — Z1211 Encounter for screening for malignant neoplasm of colon: Secondary | ICD-10-CM | POA: Insufficient documentation

## 2018-03-10 DIAGNOSIS — K5730 Diverticulosis of large intestine without perforation or abscess without bleeding: Secondary | ICD-10-CM | POA: Diagnosis not present

## 2018-03-10 DIAGNOSIS — Z7902 Long term (current) use of antithrombotics/antiplatelets: Secondary | ICD-10-CM | POA: Diagnosis not present

## 2018-03-10 DIAGNOSIS — Z79899 Other long term (current) drug therapy: Secondary | ICD-10-CM | POA: Insufficient documentation

## 2018-03-10 DIAGNOSIS — R011 Cardiac murmur, unspecified: Secondary | ICD-10-CM | POA: Diagnosis not present

## 2018-03-10 DIAGNOSIS — K5790 Diverticulosis of intestine, part unspecified, without perforation or abscess without bleeding: Secondary | ICD-10-CM | POA: Diagnosis not present

## 2018-03-10 DIAGNOSIS — M1711 Unilateral primary osteoarthritis, right knee: Secondary | ICD-10-CM | POA: Diagnosis not present

## 2018-03-10 DIAGNOSIS — Z86010 Personal history of colonic polyps: Secondary | ICD-10-CM | POA: Insufficient documentation

## 2018-03-10 HISTORY — DX: Dizziness and giddiness: R42

## 2018-03-10 HISTORY — DX: Personal history of colonic polyps: Z86.010

## 2018-03-10 HISTORY — PX: COLONOSCOPY WITH PROPOFOL: SHX5780

## 2018-03-10 HISTORY — DX: Personal history of colon polyps, unspecified: Z86.0100

## 2018-03-10 LAB — HM COLONOSCOPY

## 2018-03-10 SURGERY — COLONOSCOPY WITH PROPOFOL
Anesthesia: General

## 2018-03-10 MED ORDER — LIDOCAINE HCL (CARDIAC) PF 100 MG/5ML IV SOSY
PREFILLED_SYRINGE | INTRAVENOUS | Status: DC | PRN
  Administered 2018-03-10: 50 mg via INTRAVENOUS

## 2018-03-10 MED ORDER — PHENYLEPHRINE HCL 10 MG/ML IJ SOLN
INTRAMUSCULAR | Status: DC | PRN
  Administered 2018-03-10 (×4): 200 ug via INTRAVENOUS

## 2018-03-10 MED ORDER — SODIUM CHLORIDE 0.9 % IV SOLN
INTRAVENOUS | Status: DC
  Administered 2018-03-10: 14:00:00 via INTRAVENOUS

## 2018-03-10 MED ORDER — PROPOFOL 500 MG/50ML IV EMUL
INTRAVENOUS | Status: DC | PRN
  Administered 2018-03-10: 135 ug/kg/min via INTRAVENOUS

## 2018-03-10 MED ORDER — PROPOFOL 10 MG/ML IV BOLUS
INTRAVENOUS | Status: DC | PRN
  Administered 2018-03-10: 100 mg via INTRAVENOUS

## 2018-03-10 NOTE — Anesthesia Post-op Follow-up Note (Signed)
Anesthesia QCDR form completed.        

## 2018-03-10 NOTE — Anesthesia Preprocedure Evaluation (Signed)
Anesthesia Evaluation  Patient identified by MRN, date of birth, ID band Patient awake    Reviewed: Allergy & Precautions, H&P , NPO status , Patient's Chart, lab work & pertinent test results  History of Anesthesia Complications Negative for: history of anesthetic complications  Airway Mallampati: III  TM Distance: <3 FB Neck ROM: limited    Dental  (+) Chipped   Pulmonary neg shortness of breath,  Signs and symptoms suggestive of sleep apnea            Cardiovascular Exercise Tolerance: Good hypertension, (-) angina+ Peripheral Vascular Disease  (-) Past MI and (-) DOE + Valvular Problems/Murmurs      Neuro/Psych CVA negative psych ROS   GI/Hepatic Neg liver ROS, GERD  Medicated and Controlled,  Endo/Other  negative endocrine ROS  Renal/GU negative Renal ROS  negative genitourinary   Musculoskeletal   Abdominal   Peds  Hematology negative hematology ROS (+)   Anesthesia Other Findings Past Medical History: No date: Arthritis     Comment:  right knee No date: Carotid artery occlusion No date: Dizziness No date: GERD (gastroesophageal reflux disease) No date: Heart murmur No date: History of colon polyps No date: Hyperlipidemia No date: Hypertension 12-31-12: Stroke ([place])     Comment:  no residual effects  Past Surgical History: No date: CAROTID ENDARTERECTOMY     Comment:  RIGHT  01/19/13 08-18-12: COLONOSCOPY No date: DILATION AND CURETTAGE OF UTERUS 01/19/2013: ENDARTERECTOMY; Right     Comment:  Procedure: ENDARTERECTOMY CAROTID with patch               angioplasty;  Surgeon: [facility], MD;  Location: [place];  Service: Vascular;  Laterality: Right; No date: ESOPHAGEAL DILATION 04/15/2017: JOINT REPLACEMENT; Left No date: TUBAL LIGATION 08-18-12: UPPER GI ENDOSCOPY  BMI    Body Mass Index:  38.36 kg/m      Reproductive/Obstetrics negative OB ROS                              Anesthesia Physical Anesthesia Plan  ASA: III  Anesthesia Plan: General   Post-op Pain Management:    Induction: Intravenous  PONV Risk Score and Plan: Propofol infusion and TIVA  Airway Management Planned: Natural Airway and Nasal Cannula  Additional Equipment:   Intra-op Plan:   Post-operative Plan:   Informed Consent: I have reviewed the patients History and Physical, chart, labs and discussed the procedure including the risks, benefits and alternatives for the proposed anesthesia with the patient or authorized representative who has indicated his/her understanding and acceptance.   Dental Advisory Given  Plan Discussed with: Anesthesiologist, CRNA and Surgeon  Anesthesia Plan Comments: (Patient consented for risks of anesthesia including but not limited to:  - adverse reactions to medications - risk of intubation if required - damage to teeth, lips or other oral mucosa - sore throat or hoarseness - Damage to heart, brain, lungs or loss of life  Patient voiced understanding.)        Anesthesia Quick Evaluation

## 2018-03-10 NOTE — Transfer of Care (Signed)
Immediate Anesthesia Transfer of Care Note  Patient: Ellen Baker  Procedure(s) Performed: COLONOSCOPY WITH PROPOFOL (N/A )  Patient Location: PACU and Endoscopy Unit  Anesthesia Type:General  Level of Consciousness: drowsy  Airway & Oxygen Therapy: Patient Spontanous Breathing and Patient connected to nasal cannula oxygen  Post-op Assessment: Report given to RN and Post -op Vital signs reviewed and stable  Post vital signs: Reviewed and stable  Last Vitals:  Vitals Value Taken Time  BP 106/53 03/10/2018  3:02 PM  Temp 36.1 C 03/10/2018  2:59 PM  Pulse 89 03/10/2018  3:02 PM  Resp 18 03/10/2018  3:02 PM  SpO2 100 % 03/10/2018  3:02 PM  Vitals shown include unvalidated device data.  Last Pain:  Vitals:   03/10/18 1502  TempSrc:   PainSc: Asleep         Complications: No apparent anesthesia complications

## 2018-03-10 NOTE — Op Note (Signed)
[facility] Gastroenterology Patient Name: Ellen Baker Procedure Date: 03/10/2018 2:20 PM MRN: [id number] Account #: [id number] Date of Birth: 09-19-1950 Admit Type: Outpatient Age: 67 Room: [facility] ENDO ROOM 1 Gender: Female Note Status: Finalized Procedure:            Colonoscopy Indications:          Personal history of colonic polyps Providers:            Lollie Sails, MD Referring MD:         Juline Patch, MD (Referring MD) Medicines:            Monitored Anesthesia Care Complications:        No immediate complications. Procedure:            Pre-Anesthesia Assessment:                       - ASA Grade Assessment: III - A patient with severe                        systemic disease.                       After obtaining informed consent, the colonoscope was                        passed under direct vision. Throughout the procedure,                        the patient's blood pressure, pulse, and oxygen                        saturations were monitored continuously. The was                        introduced through the anus and advanced to the the                        cecum, identified by appendiceal orifice and ileocecal                        valve. The colonoscopy was performed with moderate                        difficulty due to significant looping. Successful                        completion of the procedure was aided by changing the                        patient to a supine position, changing the patient to a                        prone position and using manual pressure. The patient                        tolerated the procedure well. The quality of the bowel                        preparation was good. Findings:      Multiple medium-mouthed diverticula were found in  the sigmoid colon,       descending colon, transverse colon and ascending colon.      A 3 mm polyp was found in the hepatic flexure. The polyp was sessile.       The polyp was  removed with a cold biopsy forceps. Resection and       retrieval were complete.      Two sessile polyps were found in the sigmoid colon. The polyps were 4 to       6 mm in size. These polyps were removed with a cold snare. Resection and       retrieval were complete.      The exam was otherwise without abnormality.      The digital rectal exam was normal. Impression:           - Diverticulosis in the sigmoid colon, in the                        descending colon, in the transverse colon and in the                        ascending colon.                       - One 3 mm polyp at the hepatic flexure, removed with a                        cold biopsy forceps. Resected and retrieved.                       - Two 4 to 6 mm polyps in the sigmoid colon, removed                        with a cold snare. Resected and retrieved.                       - The examination was otherwise normal. Recommendation:       - Discharge patient to home.                       - Await pathology results.                       - Telephone GI clinic for pathology results in 1 week. Procedure Code(s):    --- Professional ---                       [phone], Colonoscopy, flexible; with removal of tumor(s),                        polyp(s), or other lesion(s) by snare technique                       [id number], 73, Colonoscopy, flexible; with biopsy, single                        or multiple Diagnosis Code(s):    --- Professional ---                       D12.3, Benign neoplasm of transverse colon (hepatic  flexure or splenic flexure)                       D12.5, Benign neoplasm of sigmoid colon                       Z86.010, Personal history of colonic polyps                       K57.30, Diverticulosis of large intestine without                        perforation or abscess without bleeding CPT copyright 2017 American Medical Association. All rights reserved. The codes documented in this report are  preliminary and upon coder review may  be revised to meet current compliance requirements. Lollie Sails, MD 03/10/2018 2:59:32 PM This report has been signed electronically. Number of Addenda: 0 Note Initiated On: 03/10/2018 2:20 PM Scope Withdrawal Time: 0 hours 10 minutes 43 seconds  Total Procedure Duration: 0 hours 29 minutes 21 seconds       [facility]

## 2018-03-10 NOTE — H&P (Signed)
Outpatient short stay form Pre-procedure 03/10/2018 2:17 PM Lollie Sails MD  Primary Physician: Otilio Miu, MD  Reason for visit: Colonoscopy  History of present illness: Patient is a 67 year old female presenting today for colonoscopy because of her personal history of colon polyps.  Her last procedure was 08/18/2012 with a finding of a adenomatous colon polyp.  Tolerated her prep except for the last couple of cups.  She takes no aspirin products.  She does however take Plavix that has been held for 6 days.    Current Facility-Administered Medications:  .  0.9 %  sodium chloride infusion, , Intravenous, Continuous, Lollie Sails, MD, Last Rate: 20 mL/hr at 03/10/18 1403  Medications Prior to Admission  Medication Sig Dispense Refill Last Dose  . clopidogrel (PLAVIX) 75 MG tablet TAKE 1 TABLET BY MOUTH DAILY WITH BREAKFAST 90 tablet 1 Past Week at Unknown time  . losartan (COZAAR) 50 MG tablet TAKE 1 TABLET BY MOUTH DAILY 90 tablet 1 03/10/2018 at Unknown time  . meclizine (ANTIVERT) 25 MG tablet Take 1 tablet (25 mg total) by mouth 3 (three) times daily as needed for dizziness. 30 tablet 0 Past Month at Unknown time  . Naproxen Sodium (ALEVE PO) Take by mouth continuous as needed.   Past Month at Unknown time  . omeprazole (PRILOSEC) 10 MG capsule Take 1 capsule (10 mg total) by mouth daily. Patient is not sure of doseage- 20mg  qday otc 30 capsule 11 Past Week at Unknown time  . simvastatin (ZOCOR) 20 MG tablet Take 1 tablet (20 mg total) by mouth daily. 90 tablet 1 Past Week at Unknown time  . Biotin w/ Vitamins C & E (HAIR SKIN & NAILS GUMMIES PO) Take 2 each by mouth daily.   Taking  . Calcium Carb-Ergocalciferol (CHEWABLE CALCIUM/D PO) Take 1 tablet by mouth daily.   Taking  . loratadine (CLARITIN) 10 MG tablet Take 10 mg by mouth continuous as needed for allergies.   Taking  . traZODone (DESYREL) 50 MG tablet Take 50 mg by mouth at bedtime.   Not Taking at Unknown time      Allergies  Allergen Reactions  . Adhesive [Tape] Rash    Skin became RAW     Past Medical History:  Diagnosis Date  . Arthritis    right knee  . Carotid artery occlusion   . Dizziness   . GERD (gastroesophageal reflux disease)   . Heart murmur   . History of colon polyps   . Hyperlipidemia   . Hypertension   . Stroke [facility]) 12-31-12   no residual effects    Review of systems:      Physical Exam    Heart and lungs: Him without rub or gallop, lungs are bilaterally clear.    HEENT: Normocephalic atraumatic eyes are anicteric    Other:    Pertinant exam for procedure: Soft nontender nondistended bowel sounds positive normoactive    Planned proceedures: Colonoscopy and indicated procedures. I have discussed the risks benefits and complications of procedures to include not limited to bleeding, infection, perforation and the risk of sedation and the patient wishes to proceed.    Lollie Sails, MD Gastroenterology 03/10/2018  2:17 PM

## 2018-03-11 NOTE — Anesthesia Postprocedure Evaluation (Signed)
Anesthesia Post Note  Patient: Ellen Baker  Procedure(s) Performed: COLONOSCOPY WITH PROPOFOL (N/A )  Patient location during evaluation: Endoscopy Anesthesia Type: General Level of consciousness: awake and alert Pain management: pain level controlled Vital Signs Assessment: post-procedure vital signs reviewed and stable Respiratory status: spontaneous breathing, nonlabored ventilation, respiratory function stable and patient connected to nasal cannula oxygen Cardiovascular status: blood pressure returned to baseline and stable Postop Assessment: no apparent nausea or vomiting Anesthetic complications: no     Last Vitals:  Vitals:   03/10/18 1529 03/10/18 1536  BP: 120/86 136/85  Pulse: 71 67  Resp: 20 15  Temp:    SpO2: 100% 100%    Last Pain:  Vitals:   03/10/18 1536  TempSrc:   PainSc: 0-No pain                 Precious Haws Ilai Hiller

## 2018-03-12 LAB — SURGICAL PATHOLOGY

## 2018-03-31 ENCOUNTER — Encounter: Payer: Self-pay | Admitting: Family Medicine

## 2018-03-31 ENCOUNTER — Ambulatory Visit (INDEPENDENT_AMBULATORY_CARE_PROVIDER_SITE_OTHER): Payer: PPO | Admitting: Family Medicine

## 2018-03-31 VITALS — BP 130/78 | HR 64 | Ht 63.0 in | Wt 221.0 lb

## 2018-03-31 DIAGNOSIS — J0100 Acute maxillary sinusitis, unspecified: Secondary | ICD-10-CM | POA: Diagnosis not present

## 2018-03-31 DIAGNOSIS — Z23 Encounter for immunization: Secondary | ICD-10-CM

## 2018-03-31 MED ORDER — AZITHROMYCIN 250 MG PO TABS: ORAL_TABLET | ORAL | 0 refills | Status: DC

## 2018-03-31 MED ORDER — LORATADINE 10 MG PO TABS: 10.0000 mg | ORAL_TABLET | ORAL | 6 refills | Status: AC | PRN

## 2018-03-31 MED ORDER — GUAIFENESIN-CODEINE 100-10 MG/5ML PO SYRP: 5.0000 mL | ORAL_SOLUTION | Freq: Three times a day (TID) | ORAL | 0 refills | Status: DC | PRN

## 2018-03-31 NOTE — Addendum Note (Signed)
Addended by: Fredderick Severance on: 03/31/2018 04:16 PM   Modules accepted: Orders

## 2018-03-31 NOTE — Progress Notes (Signed)
Date:  03/31/2018   Name:  Ellen Baker   DOB:  [birth year]   MRN:  [id number]   Chief Complaint: Sinusitis (cough and cong- clear production) Sinusitis  This is a new problem. The current episode started in the past 7 days (tuesday). The problem has been gradually worsening since onset. There has been no fever. The fever has been present for 3 to 4 days. The pain is mild. Associated symptoms include congestion, coughing, headaches, a hoarse voice, shortness of breath, sinus pressure, sneezing, a sore throat and swollen glands. Pertinent negatives include no chills, diaphoresis, ear pain or neck pain. (Prod nasal drainage) Past treatments include oral decongestants. The treatment provided moderate relief.     Review of Systems  Constitutional: Negative.  Negative for chills, diaphoresis, fatigue, fever and unexpected weight change.  HENT: Positive for congestion, hoarse voice, sinus pressure, sneezing and sore throat. Negative for ear discharge, ear pain and rhinorrhea.   Eyes: Negative for photophobia, pain, discharge, redness and itching.  Respiratory: Positive for cough and shortness of breath. Negative for wheezing and stridor.   Gastrointestinal: Negative for abdominal pain, blood in stool, constipation, diarrhea, nausea and vomiting.  Endocrine: Negative for cold intolerance, heat intolerance, polydipsia, polyphagia and polyuria.  Genitourinary: Negative for dysuria, flank pain, frequency, hematuria, menstrual problem, pelvic pain, urgency, vaginal bleeding and vaginal discharge.  Musculoskeletal: Negative for arthralgias, back pain, myalgias and neck pain.  Skin: Negative for rash.  Allergic/Immunologic: Negative for environmental allergies and food allergies.  Neurological: Positive for headaches. Negative for dizziness, weakness, light-headedness and numbness.  Hematological: Negative for adenopathy. Does not bruise/bleed easily.  Psychiatric/Behavioral: Negative for  dysphoric mood. The patient is not nervous/anxious.     Patient Active Problem List   Diagnosis Date Noted  . Cerebral vascular insufficiency 06/06/2015  . Dyslipidemia 06/06/2015  . Aftercare following surgery of the circulatory system, [place] 08/03/2013  . Occlusion and stenosis of carotid artery without mention of cerebral infarction 01/16/2013  . Pre-operative cardiovascular examination 01/16/2013  . Carotid artery stenosis, symptomatic 01/02/2013  . CVA (cerebral infarction) 12/31/2012  . HTN (hypertension) 12/31/2012  . GERD (gastroesophageal reflux disease) 12/31/2012    Allergies  Allergen Reactions  . Adhesive [Tape] Rash    Skin became RAW    Past Surgical History:  Procedure Laterality Date  . CAROTID ENDARTERECTOMY     RIGHT  01/19/13  . COLONOSCOPY  08-18-12  . COLONOSCOPY WITH PROPOFOL N/A 03/10/2018   Procedure: COLONOSCOPY WITH PROPOFOL;  Surgeon: Lollie Sails, MD;  Location: [facility] ENDOSCOPY;  Service: Endoscopy;  Laterality: N/A;  . DILATION AND CURETTAGE OF UTERUS    . ENDARTERECTOMY Right 01/19/2013   Procedure: ENDARTERECTOMY CAROTID with patch angioplasty;  Surgeon: Conrad , MD;  Location: [facility];  Service: Vascular;  Laterality: Right;  . ESOPHAGEAL DILATION    . JOINT REPLACEMENT Left 04/15/2017  . TUBAL LIGATION    . UPPER GI ENDOSCOPY  08-18-12    Social History   Tobacco Use  . Smoking status: Never Smoker  . Smokeless tobacco: Never Used  . Tobacco comment: Smoking cessation materials not required  Substance Use Topics  . Alcohol use: No    Alcohol/week: 0.0 standard drinks  . Drug use: No     Medication list has been reviewed and updated.  Current Meds  Medication Sig  . Biotin w/ Vitamins C & E (HAIR SKIN & NAILS GUMMIES PO) Take 2 each by mouth daily.  . Calcium Carb-Ergocalciferol (CHEWABLE  CALCIUM/D PO) Take 1 tablet by mouth daily.  . clopidogrel (PLAVIX) 75 MG tablet TAKE 1 TABLET BY MOUTH DAILY WITH BREAKFAST  . loratadine  (CLARITIN) 10 MG tablet Take 1 tablet (10 mg total) by mouth continuous as needed for allergies.  Marland Kitchen losartan (COZAAR) 50 MG tablet TAKE 1 TABLET BY MOUTH DAILY  . meclizine (ANTIVERT) 25 MG tablet Take 1 tablet (25 mg total) by mouth 3 (three) times daily as needed for dizziness.  . Multiple Vitamins-Minerals (MULTIVITAMIN GUMMIES WOMENS) CHEW Chew 1 each by mouth daily.  . Naproxen Sodium (ALEVE PO) Take by mouth continuous as needed.  Marland Kitchen omeprazole (PRILOSEC) 10 MG capsule Take 1 capsule (10 mg total) by mouth daily. Patient is not sure of doseage- 20mg  qday otc  . simvastatin (ZOCOR) 20 MG tablet Take 1 tablet (20 mg total) by mouth daily.  . [DISCONTINUED] loratadine (CLARITIN) 10 MG tablet Take 10 mg by mouth continuous as needed for allergies.  . [DISCONTINUED] traZODone (DESYREL) 50 MG tablet Take 50 mg by mouth at bedtime.    PHQ 2/9 Scores 12/09/2017 12/05/2017 12/04/2016 12/05/2015  PHQ - 2 Score 0 0 0 0  PHQ- 9 Score 0 - - -    Physical Exam  Constitutional: She is oriented to person, place, and time. She appears well-developed and well-nourished.  HENT:  Head: Normocephalic.  Right Ear: Hearing, tympanic membrane, external ear and ear canal normal.  Left Ear: Hearing, tympanic membrane, external ear and ear canal normal.  Nose: Nose normal.  Mouth/Throat: Posterior oropharyngeal erythema present. No oropharyngeal exudate or posterior oropharyngeal edema.  Eyes: Pupils are equal, round, and reactive to light. Conjunctivae and EOM are normal. Lids are everted and swept, no foreign bodies found. Left eye exhibits no hordeolum. No foreign body present in the left eye. Right conjunctiva is not injected. Left conjunctiva is not injected. No scleral icterus.  Neck: Normal range of motion. Neck supple. No JVD present. No tracheal deviation present. No thyromegaly present.  Cardiovascular: Normal rate, regular rhythm, normal heart sounds and intact distal pulses. Exam reveals no gallop and no  friction rub.  No murmur heard. Pulmonary/Chest: Effort normal and breath sounds normal. No respiratory distress. She has no wheezes. She has no rales.  Abdominal: Soft. Bowel sounds are normal. She exhibits no mass. There is no hepatosplenomegaly. There is no tenderness. There is no rebound and no guarding.  Musculoskeletal: Normal range of motion. She exhibits no edema or tenderness.  Lymphadenopathy:       Head (right side): Submandibular adenopathy present.       Head (left side): Submandibular adenopathy present.    She has no cervical adenopathy.  Neurological: She is alert and oriented to person, place, and time. She has normal strength. She displays normal reflexes. No cranial nerve deficit.  Skin: Skin is warm. No rash noted.  Psychiatric: She has a normal mood and affect. Her mood appears not anxious. She does not exhibit a depressed mood.  Nursing note and vitals reviewed.   BP 130/78   Pulse 64   Ht 5\' 3"  (1.6 m)   Wt 221 lb (100.2 kg)   SpO2 98%   BMI 39.15 kg/m   Assessment and Plan:   1. Acute maxillary sinusitis, recurrence not specified Acute Prescribe azithromycin 250 mg as directed.Also resume loratadine and robitussin AC . - loratadine (CLARITIN) 10 MG tablet; Take 1 tablet (10 mg total) by mouth continuous as needed for allergies.  Dispense: 30 tablet; Refill: 6 -  azithromycin (ZITHROMAX) 250 MG tablet; 2 today then 1 a day for 4 days  Dispense: 6 tablet; Refill: 0 - guaiFENesin-codeine (ROBITUSSIN AC) 100-10 MG/5ML syrup; Take 5 mLs by mouth 3 (three) times daily as needed for cough.  Dispense: 100 mL; Refill: 0   Dr. Otilio Miu [facility]  03/31/2018

## 2018-06-06 ENCOUNTER — Ambulatory Visit (INDEPENDENT_AMBULATORY_CARE_PROVIDER_SITE_OTHER): Payer: PPO | Admitting: Family Medicine

## 2018-06-06 ENCOUNTER — Encounter: Payer: Self-pay | Admitting: Family Medicine

## 2018-06-06 VITALS — BP 130/80 | HR 72 | Ht 63.0 in | Wt 223.0 lb

## 2018-06-06 DIAGNOSIS — I679 Cerebrovascular disease, unspecified: Secondary | ICD-10-CM | POA: Diagnosis not present

## 2018-06-06 DIAGNOSIS — D649 Anemia, unspecified: Secondary | ICD-10-CM | POA: Diagnosis not present

## 2018-06-06 DIAGNOSIS — R69 Illness, unspecified: Secondary | ICD-10-CM

## 2018-06-06 DIAGNOSIS — E611 Iron deficiency: Secondary | ICD-10-CM | POA: Diagnosis not present

## 2018-06-06 DIAGNOSIS — E785 Hyperlipidemia, unspecified: Secondary | ICD-10-CM

## 2018-06-06 DIAGNOSIS — I10 Essential (primary) hypertension: Secondary | ICD-10-CM | POA: Diagnosis not present

## 2018-06-06 MED ORDER — CLOPIDOGREL BISULFATE 75 MG PO TABS: ORAL_TABLET | ORAL | 1 refills | Status: DC

## 2018-06-06 MED ORDER — LOSARTAN POTASSIUM 50 MG PO TABS: ORAL_TABLET | ORAL | 1 refills | Status: DC

## 2018-06-06 MED ORDER — SIMVASTATIN 20 MG PO TABS: 20.0000 mg | ORAL_TABLET | Freq: Every day | ORAL | 1 refills | Status: DC

## 2018-06-06 NOTE — Progress Notes (Signed)
Date:  06/06/2018   Name:  Ellen Baker   DOB:  August 09, 1950   MRN:  [id number]   Chief Complaint: Hyperlipidemia (needs liver labs); Hypertension; and Cerebrovascular Accident (takes plavix)  Hyperlipidemia  This is a chronic problem. The current episode started more than 1 year ago. The problem is controlled. Recent lipid tests were reviewed and are normal. She has no history of chronic renal disease, diabetes, hypothyroidism, liver disease, obesity or nephrotic syndrome. There are no known factors aggravating her hyperlipidemia. Pertinent negatives include no chest pain, focal sensory loss, focal weakness, leg pain, myalgias or shortness of breath. She is currently on no antihyperlipidemic treatment. The current treatment provides mild improvement of lipids. There are no compliance problems.  Risk factors for coronary artery disease include dyslipidemia, hypertension and female sex.  Hypertension  This is a chronic problem. The current episode started more than 1 year ago. The problem has been gradually worsening since onset. Pertinent negatives include no anxiety, blurred vision, chest pain, headaches, malaise/fatigue, neck pain, orthopnea, palpitations, peripheral edema, PND, shortness of breath or sweats. There are no associated agents to hypertension. Risk factors for coronary artery disease include dyslipidemia, diabetes mellitus and obesity. There is no history of chronic renal disease.  Cerebrovascular Accident  Pertinent negatives include no abdominal pain, arthralgias, chest pain, chills, congestion, coughing, fatigue, fever, headaches, myalgias, nausea, neck pain, numbness, rash, sore throat, vomiting or weakness.    Review of Systems  Constitutional: Negative.  Negative for chills, fatigue, fever, malaise/fatigue and unexpected weight change.  HENT: Negative for congestion, ear discharge, ear pain, rhinorrhea, sinus pressure, sneezing and sore throat.   Eyes: Negative for  blurred vision, photophobia, pain, discharge, redness and itching.  Respiratory: Negative for cough, shortness of breath, wheezing and stridor.   Cardiovascular: Negative for chest pain, palpitations, orthopnea and PND.  Gastrointestinal: Negative for abdominal pain, blood in stool, constipation, diarrhea, nausea and vomiting.  Endocrine: Negative for cold intolerance, heat intolerance, polydipsia, polyphagia and polyuria.  Genitourinary: Negative for dysuria, flank pain, frequency, hematuria, menstrual problem, pelvic pain, urgency, vaginal bleeding and vaginal discharge.  Musculoskeletal: Negative for arthralgias, back pain, myalgias and neck pain.  Skin: Negative for rash.  Allergic/Immunologic: Negative for environmental allergies and food allergies.  Neurological: Negative for dizziness, focal weakness, weakness, light-headedness, numbness and headaches.  Hematological: Negative for adenopathy. Does not bruise/bleed easily.  Psychiatric/Behavioral: Negative for dysphoric mood. The patient is not nervous/anxious.     Patient Active Problem List   Diagnosis Date Noted  . Cerebral vascular insufficiency 06/06/2015  . Dyslipidemia 06/06/2015  . Aftercare following surgery of the circulatory system, [place] 08/03/2013  . Occlusion and stenosis of carotid artery without mention of cerebral infarction 01/16/2013  . Pre-operative cardiovascular examination 01/16/2013  . Carotid artery stenosis, symptomatic 01/02/2013  . CVA (cerebral infarction) 12/31/2012  . HTN (hypertension) 12/31/2012  . GERD (gastroesophageal reflux disease) 12/31/2012    Allergies  Allergen Reactions  . Adhesive [Tape] Rash    Skin became RAW    Past Surgical History:  Procedure Laterality Date  . CAROTID ENDARTERECTOMY     RIGHT  01/19/13  . COLONOSCOPY  08-18-12  . COLONOSCOPY WITH PROPOFOL N/A 03/10/2018   Procedure: COLONOSCOPY WITH PROPOFOL;  Surgeon: Lollie Sails, MD;  Location: [facility] ENDOSCOPY;   Service: Endoscopy;  Laterality: N/A;  . DILATION AND CURETTAGE OF UTERUS    . ENDARTERECTOMY Right 01/19/2013   Procedure: ENDARTERECTOMY CAROTID with patch angioplasty;  Surgeon: Conrad Hingham, MD;  Location: [facility] OR;  Service: Vascular;  Laterality: Right;  . ESOPHAGEAL DILATION    . JOINT REPLACEMENT Left 04/15/2017  . TUBAL LIGATION    . UPPER GI ENDOSCOPY  08-18-12    Social History   Tobacco Use  . Smoking status: Never Smoker  . Smokeless tobacco: Never Used  . Tobacco comment: Smoking cessation materials not required  Substance Use Topics  . Alcohol use: No    Alcohol/week: 0.0 standard drinks  . Drug use: No     Medication list has been reviewed and updated.  Current Meds  Medication Sig  . Biotin w/ Vitamins C & E (HAIR SKIN & NAILS GUMMIES PO) Take 2 each by mouth daily.  . Calcium Carb-Ergocalciferol (CHEWABLE CALCIUM/D PO) Take 1 tablet by mouth daily.  . clopidogrel (PLAVIX) 75 MG tablet TAKE 1 TABLET BY MOUTH DAILY WITH BREAKFAST  . loratadine (CLARITIN) 10 MG tablet Take 1 tablet (10 mg total) by mouth continuous as needed for allergies.  Marland Kitchen losartan (COZAAR) 50 MG tablet TAKE 1 TABLET BY MOUTH DAILY  . meclizine (ANTIVERT) 25 MG tablet Take 1 tablet (25 mg total) by mouth 3 (three) times daily as needed for dizziness.  . Multiple Vitamins-Minerals (MULTIVITAMIN GUMMIES WOMENS) CHEW Chew 1 each by mouth daily.  . Naproxen Sodium (ALEVE PO) Take by mouth continuous as needed.  Marland Kitchen omeprazole (PRILOSEC) 10 MG capsule Take 1 capsule (10 mg total) by mouth daily. Patient is not sure of doseage- 20mg  qday otc  . simvastatin (ZOCOR) 20 MG tablet Take 1 tablet (20 mg total) by mouth daily.    PHQ 2/9 Scores 06/06/2018 12/09/2017 12/05/2017 12/04/2016  PHQ - 2 Score 0 0 0 0  PHQ- 9 Score 0 0 - -    Physical Exam Vitals signs and nursing note reviewed.  Constitutional:      General: She is not in acute distress.    Appearance: She is not diaphoretic.  HENT:     Head:  Normocephalic and atraumatic.     Right Ear: External ear normal.     Left Ear: External ear normal.     Nose: Nose normal.  Eyes:     General:        Right eye: No discharge.        Left eye: No discharge.     Conjunctiva/sclera: Conjunctivae normal.     Pupils: Pupils are equal, round, and reactive to light.  Neck:     Musculoskeletal: Normal range of motion and neck supple.     Thyroid: No thyromegaly.     Vascular: No JVD.  Cardiovascular:     Rate and Rhythm: Normal rate and regular rhythm.     Heart sounds: Normal heart sounds. No murmur. No friction rub. No gallop.   Pulmonary:     Effort: Pulmonary effort is normal.     Breath sounds: Normal breath sounds.  Abdominal:     General: Bowel sounds are normal.     Palpations: Abdomen is soft. There is no mass.     Tenderness: There is no abdominal tenderness. There is no guarding.  Musculoskeletal: Normal range of motion.  Lymphadenopathy:     Cervical: No cervical adenopathy.  Skin:    General: Skin is warm and dry.  Neurological:     Mental Status: She is alert.     Deep Tendon Reflexes: Reflexes are normal and symmetric.     BP 130/80   Pulse 72   Ht 5\' 3"  (1.6  m)   Wt 223 lb (101.2 kg)   BMI 39.50 kg/m   Assessment and Plan: 1. Taking medication for chronic disease Draw hepatic function due to taking Simvastatin and CBC for plavix - Hepatic function panel - CBC w/Diff/Platelet  2. Cerebral vascular insufficiency Chronic. Controlled. Stable on med- refill plavix - clopidogrel (PLAVIX) 75 MG tablet; TAKE 1 TABLET BY MOUTH DAILY WITH BREAKFAST  Dispense: 90 tablet; Refill: 1  3. Essential hypertension Chronic. Controlled. Stable on med- refill losartan/ draw renal panel - losartan (COZAAR) 50 MG tablet; TAKE 1 TABLET BY MOUTH DAILY  Dispense: 90 tablet; Refill: 1 - Renal Function Panel  4. Dyslipidemia Chronic. Controlled- stable on med- refill simvastatin - simvastatin (ZOCOR) 20 MG tablet; Take 1  tablet (20 mg total) by mouth daily.  Dispense: 90 tablet; Refill: 1

## 2018-06-07 LAB — CBC WITH DIFFERENTIAL/PLATELET
Basophils Absolute: 0.1 10*3/uL (ref 0.0–0.2)
Basos: 1 %
EOS (ABSOLUTE): 0.2 10*3/uL (ref 0.0–0.4)
EOS: 4 %
HEMATOCRIT: 30.3 % — AB (ref 34.0–46.6)
HEMOGLOBIN: 10.1 g/dL — AB (ref 11.1–15.9)
Immature Grans (Abs): 0 10*3/uL (ref 0.0–0.1)
Immature Granulocytes: 0 %
Lymphocytes Absolute: 1.4 10*3/uL (ref 0.7–3.1)
Lymphs: 26 %
MCH: 26.7 pg (ref 26.6–33.0)
MCHC: 33.3 g/dL (ref 31.5–35.7)
MCV: 80 fL (ref 79–97)
MONOCYTES: 9 %
Monocytes Absolute: 0.5 10*3/uL (ref 0.1–0.9)
NEUTROS ABS: 3.2 10*3/uL (ref 1.4–7.0)
Neutrophils: 60 %
Platelets: 290 10*3/uL (ref 150–450)
RBC: 3.78 x10E6/uL (ref 3.77–5.28)
RDW: 13.4 % (ref 12.3–15.4)
WBC: 5.2 10*3/uL (ref 3.4–10.8)

## 2018-06-07 LAB — HEPATIC FUNCTION PANEL
ALT: 10 IU/L (ref 0–32)
AST: 17 IU/L (ref 0–40)
Alkaline Phosphatase: 79 IU/L (ref 39–117)
BILIRUBIN TOTAL: 0.4 mg/dL (ref 0.0–1.2)
Bilirubin, Direct: 0.14 mg/dL (ref 0.00–0.40)
Total Protein: 6.6 g/dL (ref 6.0–8.5)

## 2018-06-07 LAB — RENAL FUNCTION PANEL
Albumin: 3.8 g/dL (ref 3.6–4.8)
BUN/Creatinine Ratio: 12 (ref 12–28)
BUN: 8 mg/dL (ref 8–27)
CHLORIDE: 106 mmol/L (ref 96–106)
CO2: 25 mmol/L (ref 20–29)
CREATININE: 0.68 mg/dL (ref 0.57–1.00)
Calcium: 9 mg/dL (ref 8.7–10.3)
GFR, EST AFRICAN AMERICAN: 105 mL/min/{1.73_m2} (ref >59)
GFR, EST NON AFRICAN AMERICAN: 91 mL/min/{1.73_m2} (ref >59)
Glucose: 96 mg/dL (ref 65–99)
Phosphorus: 3.6 mg/dL (ref 2.5–4.5)
Potassium: 4.3 mmol/L (ref 3.5–5.2)
Sodium: 143 mmol/L (ref 134–144)

## 2018-06-27 ENCOUNTER — Other Ambulatory Visit: Payer: PPO

## 2018-06-27 DIAGNOSIS — D509 Iron deficiency anemia, unspecified: Secondary | ICD-10-CM | POA: Diagnosis not present

## 2018-06-28 LAB — HEMOGLOBIN: HEMOGLOBIN: 9.6 g/dL — AB (ref 11.1–15.9)

## 2018-06-28 LAB — FERRITIN: FERRITIN: 17 ng/mL (ref 15–150)

## 2018-06-30 ENCOUNTER — Other Ambulatory Visit: Payer: Self-pay

## 2018-06-30 DIAGNOSIS — D649 Anemia, unspecified: Secondary | ICD-10-CM

## 2018-07-01 LAB — FERRITIN: Ferritin: 14 ng/mL — ABNORMAL LOW (ref 15–150)

## 2018-07-14 ENCOUNTER — Other Ambulatory Visit (INDEPENDENT_AMBULATORY_CARE_PROVIDER_SITE_OTHER): Payer: PPO

## 2018-07-14 ENCOUNTER — Other Ambulatory Visit: Payer: Self-pay

## 2018-07-14 DIAGNOSIS — D649 Anemia, unspecified: Secondary | ICD-10-CM | POA: Diagnosis not present

## 2018-07-14 LAB — HEMOCCULT GUIAC POC 1CARD (OFFICE)
Card #2 Fecal Occult Blod, POC: POSITIVE
Card #3 Fecal Occult Blood, POC: POSITIVE
FECAL OCCULT BLD: NEGATIVE

## 2018-07-14 NOTE — Progress Notes (Unsigned)
Pt dropped off hemoccult cards today. 2 out of 3 were positive for blood. Pt was supposed to see Dr Ellen Baker this Wed for low hgb levels/ can't take iron per pt. Pt stated she is not going to be able to keep her appt for this wed due to training at work. I gave her the telephone number to call and reschedule, but also reminded her of the importance of going to this appt to find out what can be done to help her. Colonoscopy results were received today and showed multiple medium- mouthed diverticula throughout the colon. Repeat colonoscopy in 5 years. Explained this to patient as well/ telling her this could be the reason for the test results, but still need the opinion of Dr Ellen Baker.

## 2018-07-16 ENCOUNTER — Encounter: Payer: PPO | Admitting: Hematology and Oncology

## 2018-07-20 DIAGNOSIS — D649 Anemia, unspecified: Secondary | ICD-10-CM | POA: Insufficient documentation

## 2018-07-20 NOTE — Progress Notes (Signed)
[facility] day:  07/21/2018  Chief Complaint: Ellen Baker is a 68 y.o. female with a low hemoglobin who is referred in consultation by Dr. Otilio Miu for assessment and management.  HPI:  The patient has a history of reflux and dysphagia with solid foods. She takes omeprazole 20 mg po daily.   She has had anemia since 05/2018.  She has never required a blood transfusion as treatment for her anemia.  EGD on 08/18/2012 revealed reflux esophagitis.  Esophageal biopsy at 32 cm revealed columnar mucosa with acute and chronic inflammation.  Esophageal biopsy at 30 cm revealed squamous mucosa with reflux changes and mild chronic inflammation.  There was no dysplasia or malignancy.  Barium swallow on 03/07/2015 demonstrated a small stricture in the GEJ and a small hiatal hernia.   Colonoscopy on 03/10/2018 by Dr. Gustavo Lah revealed diverticulosis in the sigmoid colon, in the descending colon, in the transverse colon and in the ascending colon.  There was one 3 mm polyp at the hepatic flexure (tubular adenoma) and two 4 to 6 mm polyps (hyperplastic) in the sigmoid colon.  CBCs have been followed: 06/06/2015:  Hematocrit 39.4, hemoglobin 12.9, MCV 90, platelets 237,000, WBC 5200. 12/05/2015:  Hematocrit 41.1, hemoglobin 13.4, MCV 91, platelets 256,000, WBC 5800 with 60% segs, 26% lymphs, 9% monocytes, 1% basophils.  ANC of 3200. 06/06/2018:  Hematocrit 30.3, hemoglobin 10.1, MCV 80, platelets 290,000, WBC 5200. 06/27/2018:  Hemoglobin 9.6.  Additional labs on 06/06/2018 revealed a creatinine of 0.68, albumin 3.8, protein 6.6, and normal LFTs.  Ferritin has been followed: 14 on 06/06/2018 and 17 on 06/27/2018.  Symptomatically, she is tired "all of the time". She has noted the increased fatigued since her knee surgery in 03/2017. Patient continues to work full time, and sometimes works 9-10 hours a day. Patient notes a great deal of stress related to  her job.   Patient denies any B symptoms. She denies any issues with infections. She denies chest pain, shortness of breath at rest, and palpitations. She fatigues easily with exertion and experiences varying degrees of breathlessness. Patient denies bleeding; no hematochezia, melena, or gross hematuria.  Patient notes post-CVA balance issues since 2014. She undergoes annual carotid dopplers. She is on daily clopidogrel therapy.   Patient advises that she maintains an adequate appetite. She notes that her diet is "not good".  Patient maintains a diet rich in iron. She indicates that she eats meat and green leafy vegetables on a consistent basis (4x/week).  Weight today is 221 lb 10.8 oz (100.5 kg).  She denies ice pica. No restless legs symptoms. She notes constipation with oral iron.  Patient denies pain in the clinic today.  Granddaughter has an aplastic anemia diagnosis. Paternal aunt has leukemia.    Past Medical History:  Diagnosis Date  . Arthritis    right knee  . Carotid artery occlusion   . Dizziness   . GERD (gastroesophageal reflux disease)   . Heart murmur   . History of colon polyps   . Hyperlipidemia   . Hypertension   . Stroke [facility]) 12-31-12   no residual effects    Past Surgical History:  Procedure Laterality Date  . CAROTID ENDARTERECTOMY     RIGHT  01/19/13  . COLONOSCOPY  08-18-12  . COLONOSCOPY WITH PROPOFOL N/A 03/10/2018   Procedure: COLONOSCOPY WITH PROPOFOL;  Surgeon: Lollie Sails, MD;  Location: [facility] ENDOSCOPY;  Service: Endoscopy;  Laterality: N/A;  . DILATION AND  CURETTAGE OF UTERUS    . ENDARTERECTOMY Right 01/19/2013   Procedure: ENDARTERECTOMY CAROTID with patch angioplasty;  Surgeon: Conrad Commerce, MD;  Location: [place];  Service: Vascular;  Laterality: Right;  . ESOPHAGEAL DILATION    . JOINT REPLACEMENT Left 04/15/2017  . TUBAL LIGATION    . UPPER GI ENDOSCOPY  08-18-12    Family History  Problem Relation Age of Onset  . Stroke Mother   .  Deep vein thrombosis Mother   . Heart disease Mother        Amputation-  . Hypertension Mother   . Alzheimer's disease Mother   . Heart failure Father   . Heart disease Father        CHF  . Hypertension Father   . Varicose Veins Father   . Parkinson's disease Father   . Breast cancer Paternal Aunt 46    Social History:  reports that she has never smoked. She has never used smokeless tobacco. She reports that she does not drink alcohol or use drugs.  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  She [place].  The patient is accompanied by her daughter, Warren Lacy, today.  Allergies:  Allergies  Allergen Reactions  . Adhesive [Tape] Rash    Skin became RAW    Current Medications: Current Outpatient Medications  Medication Sig Dispense Refill  . Biotin w/ Vitamins C & E (HAIR SKIN & NAILS GUMMIES PO) Take 2 each by mouth daily.    . Calcium Carb-Ergocalciferol (CHEWABLE CALCIUM/D PO) Take 1 tablet by mouth daily.    . clopidogrel (PLAVIX) 75 MG tablet TAKE 1 TABLET BY MOUTH DAILY WITH BREAKFAST 90 tablet 1  . loratadine (CLARITIN) 10 MG tablet Take 1 tablet (10 mg total) by mouth continuous as needed for allergies. 30 tablet 6  . losartan (COZAAR) 50 MG tablet TAKE 1 TABLET BY MOUTH DAILY 90 tablet 1  . Multiple Vitamins-Minerals (MULTIVITAMIN GUMMIES WOMENS) CHEW Chew 1 each by mouth daily.    Marland Kitchen omeprazole (PRILOSEC) 10 MG capsule Take 1 capsule (10 mg total) by mouth daily. Patient is not sure of doseage- 20mg  qday otc 30 capsule 11  . simvastatin (ZOCOR) 20 MG tablet Take 1 tablet (20 mg total) by mouth daily. 90 tablet 1  . meclizine (ANTIVERT) 25 MG tablet Take 1 tablet (25 mg total) by mouth 3 (three) times daily as needed for dizziness. (Patient not taking: Reported on 07/21/2018) 30 tablet 0  . Naproxen Sodium (ALEVE PO) Take by mouth continuous as needed.     No current facility-administered medications for this visit.     Review of Systems:  GENERAL:  "Tired all of the time"  (since knee surgery 03/2017).  No fevers, sweats or weight loss. PERFORMANCE STATUS (ECOG):  1 HEENT:  No visual changes, runny nose, sore throat, mouth sores or tenderness. Lungs: Shortness of breath with exertion (vacuuming).  No cough.  No hemoptysis. Cardiac:  No chest pain, palpitations, orthopnea, or PND. GI:  Reflux on omeprazole.  Sometimes constipation then diarrhea.  No nausea, vomiting, melena or hematochezia. GU:  No urgency, frequency, dysuria, or hematuria.  No vaginal bleeding. Musculoskeletal:  No back pain.  Right knee pain (needs replacement).  No muscle tenderness. Extremities:  No pain or swelling. Skin:  No rashes or skin changes. Neuro:  H/o CVA.  No headache, numbness or weakness, balance or coordination issues. Endocrine:  No diabetes, thyroid issues, hot flashes or night sweats. Psych:  No mood changes, depression  or anxiety.  Stress related to job. Pain:  No focal pain. Review of systems:  All other systems reviewed and found to be negative.  Physical Exam: Blood pressure (!) 174/68, pulse 71, temperature 98.6 F (37 C), temperature source Tympanic, resp. rate 18, height 5\' 3"  (1.6 m), weight 221 lb 10.8 oz (100.5 kg), SpO2 100 %. GENERAL:  Well developed, well nourished, woman sitting comfortably in the exam room in no acute distress. MENTAL STATUS:  Alert and oriented to person, place and time. HEAD:  Brown hair with graying.  Normocephalic, atraumatic, face symmetric, no Cushingoid features. EYES: Pupils equal round and reactive to light and accomodation.  No conjunctivitis or scleral icterus. ENT:  Oropharynx clear without lesion.  Tongue normal. Mucous membranes moist.  RESPIRATORY:  Clear to auscultation without rales, wheezes or rhonchi. CARDIOVASCULAR:  Regular rate and rhythm without murmur, rub or gallop. ABDOMEN:  Soft, non-tender, with active bowel sounds, and no hepatosplenomegaly.  No masses. SKIN:  No rashes, ulcers or lesions. EXTREMITIES: No  edema, no skin discoloration or tenderness.  No palpable cords. LYMPH NODES: No palpable cervical, supraclavicular, axillary or inguinal adenopathy  NEUROLOGICAL: Unremarkable. PSYCH:  Appropriate.   No visits with results within 3 Day(s) from this visit.  Latest known visit with results is:  Orders Only on 07/14/2018  Component Date Value Ref Range Status  . HM Colonoscopy 03/10/2018 See Report (in chart)  See Report (in chart), Patient Reported Final    Assessment:  Ellen Baker is a 68 y.o. female with a normocytic anemia felt secondary to iron deficiency.  Diet appears good.  She denies any melena, hematochezia, hematuria or vaginal bleeding.  She is on Plavix.  Ferritin has been followed: 14 on 06/06/2018 and 17 on 06/27/2018.  EGD on 08/18/2012 revealed reflux esophagitis.  Esophageal biopsy at 32 cm revealed columnar mucosa with acute and chronic inflammation.  Esophageal biopsy at 30 cm revealed squamous mucosa with reflux changes and mild chronic inflammation.  There was no dysplasia or malignancy.  Colonoscopy on 03/10/2018 revealed diverticulosis in the sigmoid colon, in the descending colon, in the transverse colon and in the ascending colon.  There was one 3 mm polyp at the hepatic flexure (tubular adenoma) and two 4 to 6 mm polyps (hyperplastic) in the sigmoid colon.  Symptomatically, she notes chronic fatigue.  She has shortness of breath on exertion.  Exam is unremarkable.  Plan: 1.   Labs today:  CBC with diff, CMP, ferritin, iron studies, B12, folate, TSH, retic, UA. 2.   Normocytic anemia  Etiology felt secondary to iron deficiency.  MCV has decreased from baseline of 90-91 to 80.  Ferritin is low.  She has a history of reflux which by history is asymptomatic.  She has never had a capsule study.  Diet appears good.  She notes constipation with ferrous sulfate.  Discuss trial of Vitron C.  Discuss plan for Venofer if unable to improve hemoglobin and iron  stores. 3.   Rx:  Vitron C 1 tablet po TID. 4.   Preauth Venofer. 5.   RTC in 1 month for MD assessment, labs (CBC with diff, ferritin - day before), +/- Venofer.    Honor Loh, NP  07/21/2018, 2:09 PM   I saw and evaluated the patient, participating in the key portions of the service and reviewing pertinent diagnostic studies and records.  I reviewed the nurse practitioner's note and agree with the findings and the plan.  The assessment and plan were  discussed with the patient.  Several questions were asked by the patient and answered.   Nolon Stalls, MD 07/21/2018,2:09 PM

## 2018-07-21 ENCOUNTER — Inpatient Hospital Stay: Payer: PPO

## 2018-07-21 ENCOUNTER — Encounter: Payer: Self-pay | Admitting: Hematology and Oncology

## 2018-07-21 ENCOUNTER — Inpatient Hospital Stay: Payer: PPO | Attending: Hematology and Oncology | Admitting: Hematology and Oncology

## 2018-07-21 VITALS — BP 174/68 | HR 71 | Temp 98.6°F | Resp 18 | Ht 63.0 in | Wt 221.7 lb

## 2018-07-21 DIAGNOSIS — Z79899 Other long term (current) drug therapy: Secondary | ICD-10-CM | POA: Diagnosis not present

## 2018-07-21 DIAGNOSIS — I6529 Occlusion and stenosis of unspecified carotid artery: Secondary | ICD-10-CM | POA: Diagnosis not present

## 2018-07-21 DIAGNOSIS — Z7902 Long term (current) use of antithrombotics/antiplatelets: Secondary | ICD-10-CM | POA: Diagnosis not present

## 2018-07-21 DIAGNOSIS — I10 Essential (primary) hypertension: Secondary | ICD-10-CM | POA: Insufficient documentation

## 2018-07-21 DIAGNOSIS — R011 Cardiac murmur, unspecified: Secondary | ICD-10-CM | POA: Diagnosis not present

## 2018-07-21 DIAGNOSIS — E785 Hyperlipidemia, unspecified: Secondary | ICD-10-CM

## 2018-07-21 DIAGNOSIS — R1310 Dysphagia, unspecified: Secondary | ICD-10-CM | POA: Diagnosis not present

## 2018-07-21 DIAGNOSIS — K219 Gastro-esophageal reflux disease without esophagitis: Secondary | ICD-10-CM | POA: Insufficient documentation

## 2018-07-21 DIAGNOSIS — D649 Anemia, unspecified: Secondary | ICD-10-CM

## 2018-07-21 DIAGNOSIS — Z8719 Personal history of other diseases of the digestive system: Secondary | ICD-10-CM | POA: Insufficient documentation

## 2018-07-21 DIAGNOSIS — Z8673 Personal history of transient ischemic attack (TIA), and cerebral infarction without residual deficits: Secondary | ICD-10-CM | POA: Diagnosis not present

## 2018-07-21 DIAGNOSIS — D509 Iron deficiency anemia, unspecified: Secondary | ICD-10-CM | POA: Diagnosis not present

## 2018-07-21 DIAGNOSIS — Z86010 Personal history of colonic polyps: Secondary | ICD-10-CM | POA: Insufficient documentation

## 2018-07-21 DIAGNOSIS — K5900 Constipation, unspecified: Secondary | ICD-10-CM | POA: Insufficient documentation

## 2018-07-21 DIAGNOSIS — M129 Arthropathy, unspecified: Secondary | ICD-10-CM | POA: Diagnosis not present

## 2018-07-21 DIAGNOSIS — R5383 Other fatigue: Secondary | ICD-10-CM | POA: Insufficient documentation

## 2018-07-21 LAB — URINALYSIS, COMPLETE (UACMP) WITH MICROSCOPIC
Bacteria, UA: NONE SEEN
Bilirubin Urine: NEGATIVE
GLUCOSE, UA: NEGATIVE mg/dL
Hgb urine dipstick: NEGATIVE
KETONES UR: NEGATIVE mg/dL
LEUKOCYTES UA: NEGATIVE
Nitrite: NEGATIVE
Protein, ur: NEGATIVE mg/dL
RBC / HPF: NONE SEEN RBC/hpf (ref 0–5)
Specific Gravity, Urine: 1.015 (ref 1.005–1.030)
Squamous Epithelial / HPF: NONE SEEN (ref 0–5)
pH: 6.5 (ref 5.0–8.0)

## 2018-07-21 LAB — COMPREHENSIVE METABOLIC PANEL
ALT: 13 U/L (ref 0–44)
AST: 19 U/L (ref 15–41)
Albumin: 3.9 g/dL (ref 3.5–5.0)
Alkaline Phosphatase: 89 U/L (ref 38–126)
Anion gap: 10 (ref 5–15)
BUN: 11 mg/dL (ref 8–23)
CO2: 27 mmol/L (ref 22–32)
Calcium: 8.6 mg/dL — ABNORMAL LOW (ref 8.9–10.3)
Chloride: 102 mmol/L (ref 98–111)
Creatinine, Ser: 0.57 mg/dL (ref 0.44–1.00)
GFR calc Af Amer: >60 mL/min (ref >60)
GFR calc non Af Amer: >60 mL/min (ref >60)
Glucose, Bld: 102 mg/dL — ABNORMAL HIGH (ref 70–99)
Potassium: 3.6 mmol/L (ref 3.5–5.1)
Sodium: 139 mmol/L (ref 135–145)
Total Bilirubin: 0.6 mg/dL (ref 0.3–1.2)
Total Protein: 7.8 g/dL (ref 6.5–8.1)

## 2018-07-21 LAB — CBC WITH DIFFERENTIAL/PLATELET
Abs Immature Granulocytes: 0.01 10*3/uL (ref 0.00–0.07)
Basophils Absolute: 0.1 10*3/uL (ref 0.0–0.1)
Basophils Relative: 1 %
Eosinophils Absolute: 0.2 10*3/uL (ref 0.0–0.5)
Eosinophils Relative: 3 %
HCT: 35.4 % — ABNORMAL LOW (ref 36.0–46.0)
HEMOGLOBIN: 10.9 g/dL — AB (ref 12.0–15.0)
Immature Granulocytes: 0 %
LYMPHS PCT: 26 %
Lymphs Abs: 1.7 10*3/uL (ref 0.7–4.0)
MCH: 25.1 pg — ABNORMAL LOW (ref 26.0–34.0)
MCHC: 30.8 g/dL (ref 30.0–36.0)
MCV: 81.6 fL (ref 80.0–100.0)
Monocytes Absolute: 0.5 10*3/uL (ref 0.1–1.0)
Monocytes Relative: 8 %
Neutro Abs: 3.9 10*3/uL (ref 1.7–7.7)
Neutrophils Relative %: 62 %
Platelets: 331 10*3/uL (ref 150–400)
RBC: 4.34 MIL/uL (ref 3.87–5.11)
RDW: 14.2 % (ref 11.5–15.5)
WBC: 6.3 10*3/uL (ref 4.0–10.5)
nRBC: 0 % (ref 0.0–0.2)

## 2018-07-21 LAB — FERRITIN: Ferritin: 8 ng/mL — ABNORMAL LOW (ref 11–307)

## 2018-07-21 LAB — IRON AND TIBC
Iron: 20 ug/dL — ABNORMAL LOW (ref 28–170)
Saturation Ratios: 4 % — ABNORMAL LOW (ref 10.4–31.8)
TIBC: 529 ug/dL — ABNORMAL HIGH (ref 250–450)
UIBC: 509 ug/dL

## 2018-07-21 LAB — TSH: TSH: 1.757 u[IU]/mL (ref 0.350–4.500)

## 2018-07-21 LAB — RETICULOCYTES
Immature Retic Fract: 19.9 % — ABNORMAL HIGH (ref 2.3–15.9)
RBC.: 4.4 MIL/uL (ref 3.87–5.11)
Retic Count, Absolute: 48 10*3/uL (ref 19.0–186.0)
Retic Ct Pct: 1.1 % (ref 0.4–3.1)

## 2018-07-21 LAB — FOLATE: Folate: 35 ng/mL (ref >5.9)

## 2018-07-21 LAB — VITAMIN B12: Vitamin B-12: 255 pg/mL (ref 180–914)

## 2018-07-21 MED ORDER — IRON-VITAMIN C 65-125 MG PO TABS: 1.0000 | ORAL_TABLET | Freq: Two times a day (BID) | ORAL | 1 refills | Status: DC

## 2018-07-22 ENCOUNTER — Telehealth: Payer: Self-pay

## 2018-07-22 NOTE — Telephone Encounter (Signed)
-----   Message from Karen Kitchens, NP sent at 07/22/2018  8:43 AM EST ----- Needs to start on oral B12 1,000 mcg supplementation. Will plan on rechecking levels in 1 month, at which time, if not improved, we will need to consider injections.   Gaspar Bidding  ----- Message ----- From: Buel Ream, Lab [facility] [place] Sent: 07/21/2018   3:04 PM EST To: Karen Kitchens, NP

## 2018-07-22 NOTE — Telephone Encounter (Signed)
Spoke the patient to inform her that per Honor Loh the patient need to start taking B-12 oral 1,000 mcg daily and will have b-12 levels rechecked in 1 month. The patient was agreeable and understanding.

## 2018-08-25 ENCOUNTER — Inpatient Hospital Stay: Payer: PPO | Attending: Hematology and Oncology

## 2018-08-25 DIAGNOSIS — G8929 Other chronic pain: Secondary | ICD-10-CM | POA: Diagnosis not present

## 2018-08-25 DIAGNOSIS — D509 Iron deficiency anemia, unspecified: Secondary | ICD-10-CM | POA: Diagnosis not present

## 2018-08-25 DIAGNOSIS — R5383 Other fatigue: Secondary | ICD-10-CM | POA: Insufficient documentation

## 2018-08-25 DIAGNOSIS — K210 Gastro-esophageal reflux disease with esophagitis: Secondary | ICD-10-CM | POA: Insufficient documentation

## 2018-08-25 DIAGNOSIS — M25561 Pain in right knee: Secondary | ICD-10-CM | POA: Insufficient documentation

## 2018-08-25 DIAGNOSIS — Z79899 Other long term (current) drug therapy: Secondary | ICD-10-CM | POA: Insufficient documentation

## 2018-08-25 DIAGNOSIS — E538 Deficiency of other specified B group vitamins: Secondary | ICD-10-CM | POA: Insufficient documentation

## 2018-08-25 DIAGNOSIS — K5730 Diverticulosis of large intestine without perforation or abscess without bleeding: Secondary | ICD-10-CM | POA: Diagnosis not present

## 2018-08-25 DIAGNOSIS — R0602 Shortness of breath: Secondary | ICD-10-CM | POA: Diagnosis not present

## 2018-08-25 DIAGNOSIS — Z86010 Personal history of colonic polyps: Secondary | ICD-10-CM | POA: Insufficient documentation

## 2018-08-25 DIAGNOSIS — M129 Arthropathy, unspecified: Secondary | ICD-10-CM | POA: Insufficient documentation

## 2018-08-25 DIAGNOSIS — R011 Cardiac murmur, unspecified: Secondary | ICD-10-CM | POA: Diagnosis not present

## 2018-08-25 DIAGNOSIS — E785 Hyperlipidemia, unspecified: Secondary | ICD-10-CM | POA: Diagnosis not present

## 2018-08-25 DIAGNOSIS — Z8673 Personal history of transient ischemic attack (TIA), and cerebral infarction without residual deficits: Secondary | ICD-10-CM | POA: Diagnosis not present

## 2018-08-25 DIAGNOSIS — I6529 Occlusion and stenosis of unspecified carotid artery: Secondary | ICD-10-CM | POA: Insufficient documentation

## 2018-08-25 DIAGNOSIS — Z803 Family history of malignant neoplasm of breast: Secondary | ICD-10-CM | POA: Diagnosis not present

## 2018-08-25 DIAGNOSIS — Z7901 Long term (current) use of anticoagulants: Secondary | ICD-10-CM | POA: Diagnosis not present

## 2018-08-25 DIAGNOSIS — D649 Anemia, unspecified: Secondary | ICD-10-CM

## 2018-08-25 DIAGNOSIS — I10 Essential (primary) hypertension: Secondary | ICD-10-CM | POA: Diagnosis not present

## 2018-08-25 LAB — FERRITIN: Ferritin: 16 ng/mL (ref 11–307)

## 2018-08-25 LAB — CBC WITH DIFFERENTIAL/PLATELET
Abs Immature Granulocytes: 0.01 10*3/uL (ref 0.00–0.07)
Basophils Absolute: 0 10*3/uL (ref 0.0–0.1)
Basophils Relative: 1 %
Eosinophils Absolute: 0.1 10*3/uL (ref 0.0–0.5)
Eosinophils Relative: 2 %
HCT: 37.7 % (ref 36.0–46.0)
Hemoglobin: 11.8 g/dL — ABNORMAL LOW (ref 12.0–15.0)
Immature Granulocytes: 0 %
Lymphocytes Relative: 18 %
Lymphs Abs: 1.2 10*3/uL (ref 0.7–4.0)
MCH: 26.8 pg (ref 26.0–34.0)
MCHC: 31.3 g/dL (ref 30.0–36.0)
MCV: 85.5 fL (ref 80.0–100.0)
Monocytes Absolute: 0.5 10*3/uL (ref 0.1–1.0)
Monocytes Relative: 7 %
Neutro Abs: 4.7 10*3/uL (ref 1.7–7.7)
Neutrophils Relative %: 72 %
Platelets: 235 10*3/uL (ref 150–400)
RBC: 4.41 MIL/uL (ref 3.87–5.11)
RDW: 18.3 % — ABNORMAL HIGH (ref 11.5–15.5)
WBC: 6.6 10*3/uL (ref 4.0–10.5)
nRBC: 0 % (ref 0.0–0.2)

## 2018-08-25 LAB — VITAMIN B12: VITAMIN B 12: 476 pg/mL (ref 180–914)

## 2018-08-26 ENCOUNTER — Inpatient Hospital Stay (HOSPITAL_BASED_OUTPATIENT_CLINIC_OR_DEPARTMENT_OTHER): Payer: PPO | Admitting: Urgent Care

## 2018-08-26 ENCOUNTER — Inpatient Hospital Stay: Payer: PPO

## 2018-08-26 VITALS — BP 138/83 | HR 71 | Temp 97.9°F | Resp 18 | Wt 226.0 lb

## 2018-08-26 DIAGNOSIS — K5730 Diverticulosis of large intestine without perforation or abscess without bleeding: Secondary | ICD-10-CM | POA: Diagnosis not present

## 2018-08-26 DIAGNOSIS — E785 Hyperlipidemia, unspecified: Secondary | ICD-10-CM | POA: Diagnosis not present

## 2018-08-26 DIAGNOSIS — I10 Essential (primary) hypertension: Secondary | ICD-10-CM

## 2018-08-26 DIAGNOSIS — I6529 Occlusion and stenosis of unspecified carotid artery: Secondary | ICD-10-CM

## 2018-08-26 DIAGNOSIS — M129 Arthropathy, unspecified: Secondary | ICD-10-CM

## 2018-08-26 DIAGNOSIS — Z86010 Personal history of colonic polyps: Secondary | ICD-10-CM

## 2018-08-26 DIAGNOSIS — G8929 Other chronic pain: Secondary | ICD-10-CM

## 2018-08-26 DIAGNOSIS — Z8673 Personal history of transient ischemic attack (TIA), and cerebral infarction without residual deficits: Secondary | ICD-10-CM

## 2018-08-26 DIAGNOSIS — R5383 Other fatigue: Secondary | ICD-10-CM | POA: Diagnosis not present

## 2018-08-26 DIAGNOSIS — R0602 Shortness of breath: Secondary | ICD-10-CM

## 2018-08-26 DIAGNOSIS — K210 Gastro-esophageal reflux disease with esophagitis: Secondary | ICD-10-CM | POA: Diagnosis not present

## 2018-08-26 DIAGNOSIS — E538 Deficiency of other specified B group vitamins: Secondary | ICD-10-CM

## 2018-08-26 DIAGNOSIS — Z79899 Other long term (current) drug therapy: Secondary | ICD-10-CM

## 2018-08-26 DIAGNOSIS — Z803 Family history of malignant neoplasm of breast: Secondary | ICD-10-CM

## 2018-08-26 DIAGNOSIS — M25561 Pain in right knee: Secondary | ICD-10-CM

## 2018-08-26 DIAGNOSIS — Z7901 Long term (current) use of anticoagulants: Secondary | ICD-10-CM

## 2018-08-26 DIAGNOSIS — D509 Iron deficiency anemia, unspecified: Secondary | ICD-10-CM | POA: Diagnosis not present

## 2018-08-26 DIAGNOSIS — D649 Anemia, unspecified: Secondary | ICD-10-CM

## 2018-08-26 DIAGNOSIS — R011 Cardiac murmur, unspecified: Secondary | ICD-10-CM | POA: Diagnosis not present

## 2018-08-26 NOTE — Progress Notes (Signed)
Pt here for follow up. Denies any concerns at this time.  

## 2018-08-26 NOTE — Progress Notes (Signed)
[facility] day:  08/26/2018  Chief Complaint: Ellen Baker is a 68 y.o. female with a low hemoglobin who is seen for a 1 month assessment on oral iron.   HPI: Patient was last seen in the hematology clinic on 07/21/2018 for an initial consultation.  At that time, complained of fatigue and exertional shortness of breath.  Fatigue has been worsening since her knee surgery in 03/2017.  Patient continue to work full-time (9-10 hour days).  She complained of increased job-related stress.  No B symptoms or recent infections.  No chest pain, shortness of breath, or palpitations.  Denied bleeding; on chronic anticoagulation (clopidogrel) following a previous stroke.  Exam was unremarkable.  Additional lab studies ordered to assess anemia.  Labs performed on 07/21/2018 revealed a WBC of 6300 ([place] 3900).  Hemoglobin 10.9, hematocrit 35.4, MCV 81.6, and platelets 331,000.  Reticulocytes 1.1%. BUN 11 and creatinine 0.57 mg/dL. Calcium low at 8.6 mg/dL.  Iron saturation 4% with a TIBC of 529 ug/dL.  Ferritin low at 8 ng/mL.  B12 low at 255 pg/mL.  Folate normal at 35 ng/mL.  TSH normal at 1.757 uIU/mL.  UA was negative for blood.  Patient was contacted on 07/22/2018 and advised to start oral B12 supplementation at a daily 1000 mcg dose, with plans to recheck labs in 1 month and begin injections if no improvement noted.  Repeat CBC on 08/25/2018 revealed a WBC of 6600 ([place] 4700).  Hemoglobin 11.8, hematocrit 37.7, MCV 85.5, and platelets 235,000.  Ferritin had improved to 16 ng/mL on Vitron-C BID.  B12 level had improved to 476 pg/mL.  In the interim, patient feeling "some better".  She denies any acute concerns.  No shortness of breath. Patient denies that she has experienced any B symptoms. She denies any interval infections. Patient denies bleeding; no hematochezia, melena, or gross hematuria.  Patient advises that she maintains an adequate appetite. She is eating  well. Patient maintains a diet rich in iron. She indicates that she eats meat and green leafy vegetables on a consistent basis. Weight today is 225 lb 15.5 oz (102.5 kg), which compared to her last visit to the clinic, represents a 4 pound increase.  Patient on Vitron-C twice daily and oral vitamin B12 supplementation.  Patient denies pain in the clinic today.   Past Medical History:  Diagnosis Date  . Arthritis    right knee  . Carotid artery occlusion   . Dizziness   . GERD (gastroesophageal reflux disease)   . Heart murmur   . History of colon polyps   . Hyperlipidemia   . Hypertension   . Stroke [facility]) 12-31-12   no residual effects    Past Surgical History:  Procedure Laterality Date  . CAROTID ENDARTERECTOMY     RIGHT  01/19/13  . COLONOSCOPY  08-18-12  . COLONOSCOPY WITH PROPOFOL N/A 03/10/2018   Procedure: COLONOSCOPY WITH PROPOFOL;  Surgeon: Lollie Sails, MD;  Location: [facility] ENDOSCOPY;  Service: Endoscopy;  Laterality: N/A;  . DILATION AND CURETTAGE OF UTERUS    . ENDARTERECTOMY Right 01/19/2013   Procedure: ENDARTERECTOMY CAROTID with patch angioplasty;  Surgeon: Conrad Carn[place] Bay, MD;  Location: [place];  Service: Vascular;  Laterality: Right;  . ESOPHAGEAL DILATION    . JOINT REPLACEMENT Left 04/15/2017  . TUBAL LIGATION    . UPPER GI ENDOSCOPY  08-18-12    Family History  Problem Relation Age of Onset  . Stroke Mother   .  Deep vein thrombosis Mother   . Heart disease Mother        Amputation-  . Hypertension Mother   . Alzheimer's disease Mother   . Heart failure Father   . Heart disease Father        CHF  . Hypertension Father   . Varicose Veins Father   . Parkinson's disease Father   . Breast cancer Paternal Aunt 58    Social History:  reports that she has never smoked. She has never used smokeless tobacco. She reports that she does not drink alcohol or use drugs.  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  The patient is accompanied by her  daughter, Warren Lacy, today.  Allergies:  Allergies  Allergen Reactions  . Adhesive [Tape] Rash    Skin became RAW    Current Medications: Current Outpatient Medications  Medication Sig Dispense Refill  . Biotin w/ Vitamins C & E (HAIR SKIN & NAILS GUMMIES PO) Take 2 each by mouth daily.    . Calcium Carb-Ergocalciferol (CHEWABLE CALCIUM/D PO) Take 2 tablets by mouth daily.     . clopidogrel (PLAVIX) 75 MG tablet TAKE 1 TABLET BY MOUTH DAILY WITH BREAKFAST 90 tablet 1  . Iron-Vitamin C 65-125 MG TABS Take 1 tablet by mouth 2 (two) times daily. 60 tablet 1  . loratadine (CLARITIN) 10 MG tablet Take 1 tablet (10 mg total) by mouth continuous as needed for allergies. 30 tablet 6  . losartan (COZAAR) 50 MG tablet TAKE 1 TABLET BY MOUTH DAILY 90 tablet 1  . meclizine (ANTIVERT) 25 MG tablet Take 1 tablet (25 mg total) by mouth 3 (three) times daily as needed for dizziness. 30 tablet 0  . Multiple Vitamins-Minerals (MULTIVITAMIN GUMMIES WOMENS) CHEW Chew 1 each by mouth daily.    . Naproxen Sodium (ALEVE PO) Take by mouth continuous as needed.    Marland Kitchen omeprazole (PRILOSEC) 10 MG capsule Take 1 capsule (10 mg total) by mouth daily. Patient is not sure of doseage- 20mg  qday otc 30 capsule 11  . simvastatin (ZOCOR) 20 MG tablet Take 1 tablet (20 mg total) by mouth daily. 90 tablet 1  . vitamin B-12 (CYANOCOBALAMIN) 1000 MCG tablet Take 1,000 mcg by mouth daily.     No current facility-administered medications for this visit.     Review of Systems  Constitutional: Positive for malaise/fatigue ("some better"). Negative for diaphoresis, fever and weight loss (up 4 pounds).  HENT: Negative.   Eyes: Negative.   Respiratory: Negative for cough, hemoptysis, sputum production and shortness of breath.   Cardiovascular: Negative for chest pain, palpitations, orthopnea, leg swelling and PND.  Gastrointestinal: Positive for heartburn (on PPI). Negative for abdominal pain, blood in stool, constipation, diarrhea,  melena, nausea and vomiting.  Genitourinary: Negative for dysuria, frequency, hematuria and urgency.  Musculoskeletal: Positive for joint pain (RIGHT knee). Negative for back pain, falls and myalgias.  Skin: Negative for itching and rash.  Neurological: Negative for dizziness, tremors, weakness and headaches.       PMH (+) for CVA  Endo/Heme/Allergies: Does not bruise/bleed easily.  Psychiatric/Behavioral: Negative for depression, memory loss and suicidal ideas. The patient is not nervous/anxious and does not have insomnia.        Increased work related stress  All other systems reviewed and are negative.  Performance status (ECOG): 1 - Symptomatic but completely ambulatory  Vital Signs BP 138/83 (BP Location: Left Arm, Patient Position: Sitting)   Pulse 71   Temp  97.9 F (36.6 C) (Tympanic)   Resp 18   Wt 225 lb 15.5 oz (102.5 kg)   SpO2 97%   BMI 40.03 kg/m   Physical Exam  Constitutional: She is oriented to person, place, and time and well-developed, well-nourished, and in no distress.  HENT:  Head: Normocephalic and atraumatic.  Mouth/Throat: Oropharynx is clear and moist and mucous membranes are normal.  Eyes: Pupils are equal, round, and reactive to light. EOM are normal. No scleral icterus.  Neck: Normal range of motion. Neck supple. No tracheal deviation present. No thyromegaly present.  Cardiovascular: Normal rate, regular rhythm, normal heart sounds and intact distal pulses. Exam reveals no gallop and no friction rub.  No murmur heard. Pulmonary/Chest: Effort normal and breath sounds normal. No respiratory distress. She has no wheezes. She has no rales.  Abdominal: Soft. Bowel sounds are normal. She exhibits no distension. There is no abdominal tenderness.  Musculoskeletal: Normal range of motion.        General: No tenderness or edema.  Neurological: She is alert and oriented to person, place, and time.  Skin: Skin is warm and dry. No rash noted. No erythema.   Psychiatric: Mood, affect and judgment normal.  Nursing note and vitals reviewed.   Appointment on 08/25/2018  Component Date Value Ref Range Status  . Vitamin B-12 08/25/2018 476  180 - 914 pg/mL Final   Comment: (NOTE) This assay is not validated for testing neonatal or myeloproliferative syndrome specimens for Vitamin B12 levels. Performed at [facility], [place] [address], [place], [place] [address]   . Ferritin 08/25/2018 16  11 - 307 ng/mL Final   Performed at [facility], [address] [place], [place] [address]  . WBC 08/25/2018 6.6  4.0 - 10.5 K/uL Final  . RBC 08/25/2018 4.41  3.87 - 5.11 MIL/uL Final  . Hemoglobin 08/25/2018 11.8* 12.0 - 15.0 g/dL Final  . HCT 08/25/2018 37.7  36.0 - 46.0 % Final  . MCV 08/25/2018 85.5  80.0 - 100.0 fL Final  . MCH 08/25/2018 26.8  26.0 - 34.0 pg Final  . MCHC 08/25/2018 31.3  30.0 - 36.0 g/dL Final  . RDW 08/25/2018 18.3* 11.5 - 15.5 % Final  . Platelets 08/25/2018 235  150 - 400 K/uL Final  . nRBC 08/25/2018 0.0  0.0 - 0.2 % Final  . Neutrophils Relative % 08/25/2018 72  % Final  . Neutro Abs 08/25/2018 4.7  1.7 - 7.7 K/uL Final  . Lymphocytes Relative 08/25/2018 18  % Final  . Lymphs Abs 08/25/2018 1.2  0.7 - 4.0 K/uL Final  . Monocytes Relative 08/25/2018 7  % Final  . Monocytes Absolute 08/25/2018 0.5  0.1 - 1.0 K/uL Final  . Eosinophils Relative 08/25/2018 2  % Final  . Eosinophils Absolute 08/25/2018 0.1  0.0 - 0.5 K/uL Final  . Basophils Relative 08/25/2018 1  % Final  . Basophils Absolute 08/25/2018 0.0  0.0 - 0.1 K/uL Final  . Immature Granulocytes 08/25/2018 0  % Final  . Abs Immature Granulocytes 08/25/2018 0.01  0.00 - 0.07 K/uL Final   Performed at [facility], [address] [place], [place] [address]    Assessment:  [place] is a 68 y.o. female with a normocytic anemia felt secondary to iron deficiency.  Diet appears good.  She denies any melena, hematochezia, hematuria  or vaginal bleeding.  She is on Plavix.  Ferritin has been followed: 14 on 06/06/2018 and 17 on  06/27/2018.  EGD on 08/18/2012 revealed reflux esophagitis.  Esophageal biopsy at 32 cm revealed columnar mucosa with acute and chronic inflammation.  Esophageal biopsy at 30 cm revealed squamous mucosa with reflux changes and mild chronic inflammation.  There was no dysplasia or malignancy.  Colonoscopy on 03/10/2018 revealed diverticulosis in the sigmoid colon, in the descending colon, in the transverse colon and in the ascending colon.  There was one 3 mm polyp at the hepatic flexure (tubular adenoma) and two 4 to 6 mm polyps (hyperplastic) in the sigmoid colon.  B12 low on 07/21/2018 at 255 pg/mL.  Started oral B12 on 07/22/2018.  Symptomatically, patient continues to be fatigued.  She denies any acute concerns.  No shortness of breath or chest pain.  No bleeding.  Eating well; weight up 4 pounds.  She complains of chronic pain in her right knee.  Exam is grossly unremarkable.  Plan: 1. Review labs from 08/25/2018 2. Iron deficiency anemia  Hemoglobin and hematocrit appropriately improved with oral iron (Vitron-C) twice daily.  Minimal improvement in ferritin level from the 8 to 16 ng/mL.  Discuss oral iron to improve ferritin levels.  Patient wanting to hold off on IV iron replacement at this time.  Wants to increase frequency of oral iron to 3 times daily.  Continue routine lab monitoring. 3. B12 deficiency  B12 level appropriately increased to 476 pg/mL (previously 255 pg/mL).  Begin oral B12 1000 mcg supplement on 07/22/2018.  Continue routine lab monitoring. 4. RTC in 1 month for MD assessment, labs (CBC with diff, ferritin - day before), +/- Venofer.    Honor Loh, NP  08/26/2018, 9:47 PM

## 2018-09-22 ENCOUNTER — Inpatient Hospital Stay: Payer: PPO | Attending: Hematology and Oncology

## 2018-09-22 ENCOUNTER — Other Ambulatory Visit: Payer: Self-pay

## 2018-09-22 DIAGNOSIS — D649 Anemia, unspecified: Secondary | ICD-10-CM

## 2018-09-22 DIAGNOSIS — D509 Iron deficiency anemia, unspecified: Secondary | ICD-10-CM | POA: Diagnosis not present

## 2018-09-22 LAB — CBC WITH DIFFERENTIAL/PLATELET
Abs Immature Granulocytes: 0.01 10*3/uL (ref 0.00–0.07)
Basophils Absolute: 0 10*3/uL (ref 0.0–0.1)
Basophils Relative: 1 %
Eosinophils Absolute: 0.1 10*3/uL (ref 0.0–0.5)
Eosinophils Relative: 3 %
HCT: 41.5 % (ref 36.0–46.0)
Hemoglobin: 13.4 g/dL (ref 12.0–15.0)
Immature Granulocytes: 0 %
Lymphocytes Relative: 26 %
Lymphs Abs: 1.2 10*3/uL (ref 0.7–4.0)
MCH: 28.1 pg (ref 26.0–34.0)
MCHC: 32.3 g/dL (ref 30.0–36.0)
MCV: 87 fL (ref 80.0–100.0)
Monocytes Absolute: 0.4 10*3/uL (ref 0.1–1.0)
Monocytes Relative: 9 %
Neutro Abs: 2.9 10*3/uL (ref 1.7–7.7)
Neutrophils Relative %: 61 %
Platelets: 239 10*3/uL (ref 150–400)
RBC: 4.77 MIL/uL (ref 3.87–5.11)
RDW: 18 % — ABNORMAL HIGH (ref 11.5–15.5)
WBC: 4.8 10*3/uL (ref 4.0–10.5)
nRBC: 0 % (ref 0.0–0.2)

## 2018-09-22 LAB — FERRITIN: Ferritin: 23 ng/mL (ref 11–307)

## 2018-09-23 ENCOUNTER — Ambulatory Visit: Payer: PPO

## 2018-09-23 ENCOUNTER — Other Ambulatory Visit: Payer: Self-pay

## 2018-09-23 ENCOUNTER — Ambulatory Visit: Payer: PPO | Admitting: Hematology and Oncology

## 2018-09-24 ENCOUNTER — Inpatient Hospital Stay (HOSPITAL_BASED_OUTPATIENT_CLINIC_OR_DEPARTMENT_OTHER): Payer: PPO | Admitting: Hematology and Oncology

## 2018-09-24 ENCOUNTER — Inpatient Hospital Stay: Payer: PPO | Admitting: Hematology and Oncology

## 2018-09-24 ENCOUNTER — Inpatient Hospital Stay: Payer: PPO

## 2018-09-24 ENCOUNTER — Encounter: Payer: Self-pay | Admitting: Hematology and Oncology

## 2018-09-24 DIAGNOSIS — D508 Other iron deficiency anemias: Secondary | ICD-10-CM | POA: Diagnosis not present

## 2018-09-24 DIAGNOSIS — I10 Essential (primary) hypertension: Secondary | ICD-10-CM | POA: Diagnosis not present

## 2018-09-24 DIAGNOSIS — E538 Deficiency of other specified B group vitamins: Secondary | ICD-10-CM | POA: Diagnosis not present

## 2018-09-24 DIAGNOSIS — Z79899 Other long term (current) drug therapy: Secondary | ICD-10-CM | POA: Diagnosis not present

## 2018-09-24 DIAGNOSIS — E611 Iron deficiency: Secondary | ICD-10-CM

## 2018-09-24 NOTE — Progress Notes (Signed)
[facility]  [address], [address] [place], [place] [address] Phone: [phone]  Fax: [phone]   Telephone Office Visit:  09/24/2018   Referring physician:  Juline Patch, MD   I connected with [place] on 09/24/18 at 2:50 PM EDT by telephone and verified that I was speaking with the correct person using 2 identifiers.  The patient was at home.  I discussed the limitations, risk, security and privacy concerns of performing an evaluation and management service by telephone and the availability of in person appointments.  I also discussed with the patient that there may be a patient responsible charge related to this service.  The patient expressed understanding and agreed to proceed.    Chief Complaint: Ellen Baker is a 68 y.o. female with iron deficiency anemia and B12 deficiency who is evaluated for 1 month assessment.  HPI:  The patient was last seen in the hematology clinic on 08/26/2018 by Honor Loh, NP.  At that time, she remained fatigued.  She denied any acute concerns.  No shortness of breath or chest pain.  No bleeding.  Eating well; weight was up 4 pounds.  She complained of chronic pain in her right knee.  Exam was grossly unremarkable.  Patient continued oral iron.  Iron was increased to 3 times a day.  During the interim, she has felt "good".  She states that sometimes her knee will not cooperate.  She is taking omeprazole without any problems.  She notes less work related stress.  She plans on retiring next year.  She is currently working from home secondary to the COVID-19 pandemic.   Past Medical History:  Diagnosis Date  . Arthritis    right knee  . Carotid artery occlusion   . Dizziness   . GERD (gastroesophageal reflux disease)   . Heart murmur   . History of colon polyps   . Hyperlipidemia   . Hypertension   . Stroke [facility]) 12-31-12   no residual effects    Past Surgical History:  Procedure Laterality  Date  . CAROTID ENDARTERECTOMY     RIGHT  01/19/13  . COLONOSCOPY  08-18-12  . COLONOSCOPY WITH PROPOFOL N/A 03/10/2018   Procedure: COLONOSCOPY WITH PROPOFOL;  Surgeon: Lollie Sails, MD;  Location: [facility] ENDOSCOPY;  Service: Endoscopy;  Laterality: N/A;  . DILATION AND CURETTAGE OF UTERUS    . ENDARTERECTOMY Right 01/19/2013   Procedure: ENDARTERECTOMY CAROTID with patch angioplasty;  Surgeon: Conrad Greenwood, MD;  Location: [facility];  Service: Vascular;  Laterality: Right;  . ESOPHAGEAL DILATION    . JOINT REPLACEMENT Left 04/15/2017  . TUBAL LIGATION    . UPPER GI ENDOSCOPY  08-18-12    Family History  Problem Relation Age of Onset  . Stroke Mother   . Deep vein thrombosis Mother   . Heart disease Mother        Amputation-  . Hypertension Mother   . Alzheimer's disease Mother   . Heart failure Father   . Heart disease Father        CHF  . Hypertension Father   . Varicose Veins Father   . Parkinson's disease Father   . Breast cancer Paternal Aunt 32    Social History:  reports that she has never smoked. She has never used smokeless tobacco. She reports that she does not drink alcohol or use drugs.  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  She lives in [place].  Her daughter's  name is Amy.  Participants in the patient's visit included the patient and Waymon Budge, RN today.  The intake visit was provided by Waymon Budge, RN.   Allergies:  Allergies  Allergen Reactions  . Adhesive [Tape] Rash    Skin became RAW    Current Medications: Current Outpatient Medications  Medication Sig Dispense Refill  . acetaminophen (TYLENOL) 650 MG CR tablet Take 650 mg by mouth every 8 (eight) hours as needed for pain.    . Biotin w/ Vitamins C & E (HAIR SKIN & NAILS GUMMIES PO) Take 2 each by mouth daily.    . Calcium Carb-Ergocalciferol (CHEWABLE CALCIUM/D PO) Take 2 tablets by mouth daily.     . clopidogrel (PLAVIX) 75 MG tablet TAKE 1 TABLET BY MOUTH DAILY WITH BREAKFAST 90  tablet 1  . Iron-Vitamin C 65-125 MG TABS Take 1 tablet by mouth 2 (two) times daily. (Patient taking differently: Take 1 tablet by mouth 3 (three) times daily. ) 60 tablet 1  . loratadine (CLARITIN) 10 MG tablet Take 1 tablet (10 mg total) by mouth continuous as needed for allergies. 30 tablet 6  . losartan (COZAAR) 50 MG tablet TAKE 1 TABLET BY MOUTH DAILY 90 tablet 1  . meclizine (ANTIVERT) 25 MG tablet Take 1 tablet (25 mg total) by mouth 3 (three) times daily as needed for dizziness. 30 tablet 0  . Multiple Vitamins-Minerals (MULTIVITAMIN GUMMIES WOMENS) CHEW Chew 1 each by mouth daily.    . Naproxen Sodium (ALEVE PO) Take by mouth continuous as needed.    Marland Kitchen omeprazole (PRILOSEC) 10 MG capsule Take 1 capsule (10 mg total) by mouth daily. Patient is not sure of doseage- 20mg  qday otc 30 capsule 11  . simvastatin (ZOCOR) 20 MG tablet Take 1 tablet (20 mg total) by mouth daily. 90 tablet 1  . vitamin B-12 (CYANOCOBALAMIN) 1000 MCG tablet Take 1,000 mcg by mouth daily.     No current facility-administered medications for this visit.     Review of Systems  Constitutional: Negative for chills, diaphoresis, fever, malaise/fatigue and weight loss.       Feels "good".  HENT: Negative.  Negative for congestion, ear discharge, ear pain, nosebleeds, sinus pain and sore throat.   Eyes: Negative.  Negative for blurred vision, double vision, photophobia and pain.  Respiratory: Negative.  Negative for cough, hemoptysis, sputum production and shortness of breath.   Cardiovascular: Negative.  Negative for chest pain, palpitations, orthopnea, leg swelling and PND.  Gastrointestinal: Negative for abdominal pain, blood in stool, constipation, diarrhea, heartburn (resolved on omeprazole), melena, nausea and vomiting.  Genitourinary: Negative.  Negative for dysuria, frequency, hematuria and urgency.  Musculoskeletal: Positive for joint pain (RIGHT knee). Negative for back pain, falls, myalgias and neck pain.   Skin: Negative.  Negative for itching and rash.  Neurological: Negative for dizziness, tremors, speech change, focal weakness, weakness and headaches.       History of a CVA.  Endo/Heme/Allergies: Does not bruise/bleed easily.  Psychiatric/Behavioral: Negative for depression and memory loss. The patient is not nervous/anxious and does not have insomnia.        Work related stress, improved.  All other systems reviewed and are negative.  Performance status (ECOG): 1   Appointment on 09/22/2018  Component Date Value Ref Range Status  . WBC 09/22/2018 4.8  4.0 - 10.5 K/uL Final  . RBC 09/22/2018 4.77  3.87 - 5.11 MIL/uL Final  . Hemoglobin 09/22/2018 13.4  12.0 - 15.0 g/dL Final  . HCT  09/22/2018 41.5  36.0 - 46.0 % Final  . MCV 09/22/2018 87.0  80.0 - 100.0 fL Final  . MCH 09/22/2018 28.1  26.0 - 34.0 pg Final  . MCHC 09/22/2018 32.3  30.0 - 36.0 g/dL Final  . RDW 09/22/2018 18.0* 11.5 - 15.5 % Final  . Platelets 09/22/2018 239  150 - 400 K/uL Final  . nRBC 09/22/2018 0.0  0.0 - 0.2 % Final  . Neutrophils Relative % 09/22/2018 61  % Final  . Neutro Abs 09/22/2018 2.9  1.7 - 7.7 K/uL Final  . Lymphocytes Relative 09/22/2018 26  % Final  . Lymphs Abs 09/22/2018 1.2  0.7 - 4.0 K/uL Final  . Monocytes Relative 09/22/2018 9  % Final  . Monocytes Absolute 09/22/2018 0.4  0.1 - 1.0 K/uL Final  . Eosinophils Relative 09/22/2018 3  % Final  . Eosinophils Absolute 09/22/2018 0.1  0.0 - 0.5 K/uL Final  . Basophils Relative 09/22/2018 1  % Final  . Basophils Absolute 09/22/2018 0.0  0.0 - 0.1 K/uL Final  . Immature Granulocytes 09/22/2018 0  % Final  . Abs Immature Granulocytes 09/22/2018 0.01  0.00 - 0.07 K/uL Final   Performed at [facility], [address], [place], [place] [address]  . Ferritin 09/22/2018 23  11 - 307 ng/mL Final   Performed at [facility], [address] [place], [place] [address]    Assessment:  Ellen Baker is a 68 y.o. female with  iron deficiency.  Diet appears good.  She denies any melena, hematochezia, hematuria or vaginal bleeding.  She is on Plavix.  Work-up on 07/21/2018 revealed a hematocrit of 35.4, hemoglobin 10.9, MCV 81.6, platelets 331,000, WBC 6300 with an ANC of 3900.  Ferritin was 8 with an iron saturation of 4% and a TIBC of 529.  B12 was 255 (low normal).  Retic was 1.1%.  Normal studies included folate, TSH, and urinalysis (no hematuria).  She is on Vitron-C.  Ferritin has been followed: 14 on 06/06/2018, 17 on 06/27/2018, 8 on 07/21/2018, 16 on 08/25/2018, and 23 on 09/22/2018.  EGD on 08/18/2012 revealed reflux esophagitis.  Esophageal biopsy at 32 cm revealed columnar mucosa with acute and chronic inflammation.  Esophageal biopsy at 30 cm revealed squamous mucosa with reflux changes and mild chronic inflammation.  There was no dysplasia or malignancy.  Colonoscopy on 03/10/2018 revealed diverticulosis in the sigmoid colon, in the descending colon, in the transverse colon and in the ascending colon.  There was one 3 mm polyp at the hepatic flexure (tubular adenoma) and two 4 to 6 mm polyps (hyperplastic) in the sigmoid colon.  She has B12 deficiency.  She started oral B12 on 07/22/2018.  B12 was 255 on 02/03/20200 and 476 on 08/25/2018.  Folate was 35 on 07/21/2018.  Symptomatically, she feels good.  She denies any reflux symptoms on omeprazole.  Plan: 1. Review labs from 09/22/2018. 2. Iron deficiency anemia Hemoglobin 13.4 and MCV 87. Ferritin 23. Iron stores slowly improving.   Continue oral iron (Vitron-C) 3 times/day. 3. B12 deficiency B12 level normalized on oral B12. Folate normal. 4.   RTC in 2 months for labs (CBC and ferritin). 5.   RTC in 4 months for MD assessment and labs (CBC with diff, ferritin- day before).   I discussed the assessment and treatment plan with the patient.  The patient was provided an opportunity to ask questions and all were answered.  The patient agreed with  the plan and demonstrated  an understanding of the instructions.  The patient was advised to call back or seek an in person evaluation if the symptoms worsen or if the condition fails to improve as anticipated.  I provided 14 minutes (2:50 PM - 3:04 PM) of non-face-to-face time during this encounter.  I provided these services from the [facility] office.   Lequita Asal, MD, PhD  09/24/2018, 3:04 PM

## 2018-09-24 NOTE — Progress Notes (Signed)
Confirmed name, DOB, and address. Denies any concerns at this time.  

## 2018-11-25 ENCOUNTER — Telehealth: Payer: Self-pay | Admitting: Hematology and Oncology

## 2018-11-25 ENCOUNTER — Other Ambulatory Visit: Payer: Self-pay

## 2018-11-25 NOTE — Telephone Encounter (Signed)
Spoke to pt and completed travel screen. Also explained about addl screening questions they will be asked, new guidelines about mask req, no visitors, and fever checks °

## 2018-11-26 ENCOUNTER — Inpatient Hospital Stay: Payer: PPO | Attending: Hematology and Oncology

## 2018-11-26 DIAGNOSIS — E538 Deficiency of other specified B group vitamins: Secondary | ICD-10-CM

## 2018-11-26 DIAGNOSIS — E611 Iron deficiency: Secondary | ICD-10-CM

## 2018-11-26 DIAGNOSIS — D509 Iron deficiency anemia, unspecified: Secondary | ICD-10-CM | POA: Insufficient documentation

## 2018-11-26 LAB — CBC
HCT: 40.1 % (ref 36.0–46.0)
Hemoglobin: 13.3 g/dL (ref 12.0–15.0)
MCH: 30.7 pg (ref 26.0–34.0)
MCHC: 33.2 g/dL (ref 30.0–36.0)
MCV: 92.6 fL (ref 80.0–100.0)
Platelets: 221 10*3/uL (ref 150–400)
RBC: 4.33 MIL/uL (ref 3.87–5.11)
RDW: 13.2 % (ref 11.5–15.5)
WBC: 5.9 10*3/uL (ref 4.0–10.5)
nRBC: 0 % (ref 0.0–0.2)

## 2018-11-26 LAB — FERRITIN: Ferritin: 42 ng/mL (ref 11–307)

## 2018-12-10 ENCOUNTER — Encounter: Payer: Self-pay | Admitting: Family Medicine

## 2018-12-10 ENCOUNTER — Ambulatory Visit (INDEPENDENT_AMBULATORY_CARE_PROVIDER_SITE_OTHER): Payer: PPO | Admitting: Family Medicine

## 2018-12-10 ENCOUNTER — Other Ambulatory Visit: Payer: Self-pay

## 2018-12-10 VITALS — BP 132/80 | HR 72 | Ht 63.0 in | Wt 227.0 lb

## 2018-12-10 DIAGNOSIS — I10 Essential (primary) hypertension: Secondary | ICD-10-CM

## 2018-12-10 DIAGNOSIS — K219 Gastro-esophageal reflux disease without esophagitis: Secondary | ICD-10-CM | POA: Diagnosis not present

## 2018-12-10 DIAGNOSIS — I679 Cerebrovascular disease, unspecified: Secondary | ICD-10-CM

## 2018-12-10 DIAGNOSIS — R69 Illness, unspecified: Secondary | ICD-10-CM | POA: Diagnosis not present

## 2018-12-10 DIAGNOSIS — E785 Hyperlipidemia, unspecified: Secondary | ICD-10-CM

## 2018-12-10 MED ORDER — CLOPIDOGREL BISULFATE 75 MG PO TABS: ORAL_TABLET | ORAL | 1 refills | Status: DC

## 2018-12-10 MED ORDER — SIMVASTATIN 20 MG PO TABS: 20.0000 mg | ORAL_TABLET | Freq: Every day | ORAL | 1 refills | Status: DC

## 2018-12-10 MED ORDER — LOSARTAN POTASSIUM 50 MG PO TABS: ORAL_TABLET | ORAL | 1 refills | Status: DC

## 2018-12-10 NOTE — Patient Instructions (Signed)

## 2018-12-10 NOTE — Progress Notes (Signed)
Date:  12/10/2018   Name:  Ellen Baker   DOB:  Mar 03, 1951   MRN:  [id number]   Chief Complaint: Hyperlipidemia, Hypertension, and Coronary Artery Disease  Hyperlipidemia This is a chronic problem. The current episode started more than 1 year ago. The problem is controlled. Recent lipid tests were reviewed and are normal. She has no history of chronic renal disease, diabetes, hypothyroidism, liver disease or nephrotic syndrome. Pertinent negatives include no chest pain, focal sensory loss, focal weakness, leg pain, myalgias or shortness of breath. Current antihyperlipidemic treatment includes statins. The current treatment provides moderate improvement of lipids. There are no compliance problems.  Risk factors for coronary artery disease include dyslipidemia, obesity and hypertension.  Hypertension This is a chronic problem. The current episode started more than 1 year ago. The problem has been waxing and waning since onset. The problem is controlled. Pertinent negatives include no anxiety, blurred vision, chest pain, headaches, malaise/fatigue, neck pain, orthopnea, palpitations, peripheral edema, PND or shortness of breath. There are no associated agents to hypertension. Risk factors for coronary artery disease include dyslipidemia and obesity. Past treatments include angiotensin blockers. The current treatment provides moderate improvement. There are no compliance problems.  There is no history of angina, kidney disease, CAD/MI, CVA, heart failure, left ventricular hypertrophy, PVD or retinopathy. There is no history of chronic renal disease, a hypertension causing med or renovascular disease.  Neurologic Problem The patient's pertinent negatives include no focal sensory loss or focal weakness. Primary symptoms comment: for cerebral vascular disease. The current episode started more than 1 year ago. The problem is unchanged. Pertinent negatives include no abdominal pain, auditory change,  aura, back pain, bladder incontinence, bowel incontinence, chest pain, confusion, diaphoresis, dizziness, fatigue, fever, headaches, light-headedness, nausea, neck pain, palpitations, shortness of breath, vertigo or vomiting. Past treatments include nothing. The treatment provided mild relief. There is no history of a bleeding disorder, a clotting disorder, a CVA, dementia, head trauma, liver disease, mood changes or seizures.    Review of Systems  Constitutional: Negative for chills, diaphoresis, fatigue, fever and malaise/fatigue.  HENT: Negative for drooling, ear discharge, ear pain and sore throat.   Eyes: Negative for blurred vision.  Respiratory: Negative for cough, shortness of breath and wheezing.   Cardiovascular: Negative for chest pain, palpitations, orthopnea, leg swelling and PND.  Gastrointestinal: Negative for abdominal pain, blood in stool, bowel incontinence, constipation, diarrhea, nausea and vomiting.  Endocrine: Negative for polydipsia.  Genitourinary: Negative for bladder incontinence, dysuria, frequency, hematuria and urgency.  Musculoskeletal: Negative for back pain, myalgias and neck pain.  Skin: Negative for rash.  Allergic/Immunologic: Negative for environmental allergies.  Neurological: Negative for dizziness, vertigo, focal weakness, light-headedness and headaches.  Hematological: Does not bruise/bleed easily.  Psychiatric/Behavioral: Negative for confusion and suicidal ideas. The patient is not nervous/anxious.     Patient Active Problem List   Diagnosis Date Noted   B12 deficiency 09/24/2018   Normocytic anemia 07/20/2018   Cerebral vascular insufficiency 06/06/2015   Dyslipidemia 06/06/2015   Aftercare following surgery of the circulatory system, NEC 08/03/2013   Occlusion and stenosis of carotid artery without mention of cerebral infarction 01/16/2013   Pre-operative cardiovascular examination 01/16/2013   Carotid artery stenosis, symptomatic  01/02/2013   CVA (cerebral infarction) 12/31/2012   HTN (hypertension) 12/31/2012   GERD (gastroesophageal reflux disease) 12/31/2012    Allergies  Allergen Reactions   Adhesive [Tape] Rash    Skin became RAW    Past Surgical History:  Procedure Laterality  Date   CAROTID ENDARTERECTOMY     RIGHT  01/19/13   COLONOSCOPY  08-18-12   COLONOSCOPY WITH PROPOFOL N/A 03/10/2018   Procedure: COLONOSCOPY WITH PROPOFOL;  Surgeon: Lollie Sails, MD;  Location: [facility] ENDOSCOPY;  Service: Endoscopy;  Laterality: N/A;   DILATION AND CURETTAGE OF UTERUS     ENDARTERECTOMY Right 01/19/2013   Procedure: ENDARTERECTOMY CAROTID with patch angioplasty;  Surgeon: Conrad Midway, MD;  Location: [facility];  Service: Vascular;  Laterality: Right;   ESOPHAGEAL DILATION     JOINT REPLACEMENT Left 04/15/2017   TUBAL LIGATION     UPPER GI ENDOSCOPY  08-18-12    Social History   Tobacco Use   Smoking status: Never Smoker   Smokeless tobacco: Never Used   Tobacco comment: Smoking cessation materials not required  Substance Use Topics   Alcohol use: No    Alcohol/week: 0.0 standard drinks   Drug use: No     Medication list has been reviewed and updated.  Current Meds  Medication Sig   acetaminophen (TYLENOL) 650 MG CR tablet Take 650 mg by mouth every 8 (eight) hours as needed for pain.   Biotin w/ Vitamins C & E (HAIR SKIN & NAILS GUMMIES PO) Take 2 each by mouth daily.   Calcium Carb-Ergocalciferol (CHEWABLE CALCIUM/D PO) Take 2 tablets by mouth daily.    clopidogrel (PLAVIX) 75 MG tablet TAKE 1 TABLET BY MOUTH DAILY WITH BREAKFAST   Iron-Vitamin C 65-125 MG TABS Take 1 tablet by mouth 2 (two) times daily. (Patient taking differently: Take 1 tablet by mouth 3 (three) times daily. )   loratadine (CLARITIN) 10 MG tablet Take 1 tablet (10 mg total) by mouth continuous as needed for allergies.   losartan (COZAAR) 50 MG tablet TAKE 1 TABLET BY MOUTH DAILY   meclizine (ANTIVERT) 25  MG tablet Take 1 tablet (25 mg total) by mouth 3 (three) times daily as needed for dizziness.   Multiple Vitamins-Minerals (MULTIVITAMIN GUMMIES WOMENS) CHEW Chew 1 each by mouth daily.   Naproxen Sodium (ALEVE PO) Take by mouth continuous as needed.   omeprazole (PRILOSEC) 10 MG capsule Take 1 capsule (10 mg total) by mouth daily. Patient is not sure of doseage- 20mg  qday otc   simvastatin (ZOCOR) 20 MG tablet Take 1 tablet (20 mg total) by mouth daily.   vitamin B-12 (CYANOCOBALAMIN) 1000 MCG tablet Take 1,000 mcg by mouth daily.    PHQ 2/9 Scores 06/06/2018 12/09/2017 12/05/2017 12/04/2016  PHQ - 2 Score 0 0 0 0  PHQ- 9 Score 0 0 - -    BP Readings from Last 3 Encounters:  12/10/18 132/80  08/26/18 138/83  07/21/18 (!) 174/68    Physical Exam Vitals signs and nursing note reviewed.  Constitutional:      General: She is not in acute distress.    Appearance: She is normal weight. She is not diaphoretic.  HENT:     Head: Normocephalic and atraumatic.     Right Ear: Tympanic membrane, ear canal and external ear normal.     Left Ear: Tympanic membrane, ear canal and external ear normal.     Nose: Nose normal. No congestion or rhinorrhea.     Mouth/Throat:     Mouth: Mucous membranes are moist.  Eyes:     General:        Right eye: No discharge.        Left eye: No discharge.     Conjunctiva/sclera: Conjunctivae normal.  Pupils: Pupils are equal, round, and reactive to light.  Neck:     Musculoskeletal: Normal range of motion and neck supple. No neck rigidity or muscular tenderness.     Thyroid: No thyromegaly.     Vascular: No carotid bruit or JVD.  Cardiovascular:     Rate and Rhythm: Normal rate and regular rhythm.     Pulses: Normal pulses.     Heart sounds: Normal heart sounds. No murmur. No friction rub. No gallop.   Pulmonary:     Effort: Pulmonary effort is normal. No respiratory distress.     Breath sounds: Normal breath sounds. No wheezing, rhonchi or rales.   Chest:     Chest wall: No tenderness.  Abdominal:     General: Bowel sounds are normal.     Palpations: Abdomen is soft. There is no mass.     Tenderness: There is no abdominal tenderness. There is no guarding.  Musculoskeletal: Normal range of motion.  Lymphadenopathy:     Cervical: No cervical adenopathy.  Skin:    General: Skin is warm and dry.  Neurological:     Mental Status: She is alert.     Deep Tendon Reflexes: Reflexes are normal and symmetric.     Wt Readings from Last 3 Encounters:  12/10/18 227 lb (103 kg)  08/26/18 225 lb 15.5 oz (102.5 kg)  07/21/18 221 lb 10.8 oz (100.5 kg)    BP 132/80    Pulse 72    Ht 5\' 3"  (1.6 m)    Wt 227 lb (103 kg)    BMI 40.21 kg/m   Assessment and Plan:  1. Cerebral vascular insufficiency Chronic.  Controlled.  Patient is stable from a previous cerebrovascular event with no further episodes since last visit.  We will continue Plavix 75 mg once a day. - clopidogrel (PLAVIX) 75 MG tablet; TAKE 1 TABLET BY MOUTH DAILY WITH BREAKFAST  Dispense: 90 tablet; Refill: 1  2. Essential hypertension Chronic.  Controlled.  Continue Cozaar 50 mg once a day.  Will check renal function panel to evaluate for GFR. - Renal Function Panel - losartan (COZAAR) 50 MG tablet; TAKE 1 TABLET BY MOUTH DAILY  Dispense: 90 tablet; Refill: 1  3. Gastroesophageal reflux disease, esophagitis presence not specified .  Controlled.  Continue omeprazole 10 mg once a day.  4. Dyslipidemia Chronic.  Controlled.  Continue simvastatin 20 mg once a day.  Will check lipid panel. - Lipid Panel With LDL/HDL Ratio - simvastatin (ZOCOR) 20 MG tablet; Take 1 tablet (20 mg total) by mouth daily.  Dispense: 90 tablet; Refill: 1  5. Taking medication for chronic disease Patient is on statin will check hepatic function panel to rule out hepatotoxicity. - Hepatic function panel

## 2018-12-11 LAB — RENAL FUNCTION PANEL
Albumin: 4.1 g/dL (ref 3.8–4.8)
BUN/Creatinine Ratio: 15 (ref 12–28)
BUN: 10 mg/dL (ref 8–27)
CO2: 26 mmol/L (ref 20–29)
Calcium: 9.5 mg/dL (ref 8.7–10.3)
Chloride: 104 mmol/L (ref 96–106)
Creatinine, Ser: 0.68 mg/dL (ref 0.57–1.00)
GFR calc Af Amer: 105 mL/min/{1.73_m2} (ref >59)
GFR calc non Af Amer: 91 mL/min/{1.73_m2} (ref >59)
Glucose: 94 mg/dL (ref 65–99)
Phosphorus: 4.3 mg/dL (ref 3.0–4.3)
Potassium: 4.8 mmol/L (ref 3.5–5.2)
Sodium: 143 mmol/L (ref 134–144)

## 2018-12-11 LAB — LIPID PANEL WITH LDL/HDL RATIO
Cholesterol, Total: 188 mg/dL (ref 100–199)
HDL: 64 mg/dL (ref >39)
LDL Calculated: 107 mg/dL — ABNORMAL HIGH (ref 0–99)
LDl/HDL Ratio: 1.7 ratio (ref 0.0–3.2)
Triglycerides: 84 mg/dL (ref 0–149)
VLDL Cholesterol Cal: 17 mg/dL (ref 5–40)

## 2018-12-11 LAB — HEPATIC FUNCTION PANEL
ALT: 13 IU/L (ref 0–32)
AST: 17 IU/L (ref 0–40)
Alkaline Phosphatase: 79 IU/L (ref 39–117)
Bilirubin Total: 0.8 mg/dL (ref 0.0–1.2)
Bilirubin, Direct: 0.23 mg/dL (ref 0.00–0.40)
Total Protein: 6.8 g/dL (ref 6.0–8.5)

## 2019-01-12 ENCOUNTER — Other Ambulatory Visit: Payer: Self-pay | Admitting: Internal Medicine

## 2019-01-12 ENCOUNTER — Other Ambulatory Visit: Payer: Self-pay | Admitting: Family Medicine

## 2019-01-12 DIAGNOSIS — Z1231 Encounter for screening mammogram for malignant neoplasm of breast: Secondary | ICD-10-CM

## 2019-01-19 ENCOUNTER — Other Ambulatory Visit: Payer: Self-pay

## 2019-01-19 NOTE — Progress Notes (Signed)
[facility]  [address] [place], [place] [address] Phone: [phone]  Fax: [phone]   Clinic Day:  01/21/2019  Referring physician: Juline Patch, MD  Chief Complaint: Ellen Baker is a 68 y.o. female with iron deficiency anemia and B12 deficiency who is evaluated for 4 month assessment.  HPI: The patient was last seen in the hematology clinic on 09/24/2018. At that time, she felt good. She denied any reflux symptoms on omeprazole.   Hematocrit was 41.5, hemoglobin 13.4 and MCV 87.  Ferritin was 23.  She continued oral iron.  Labs followed: 11/26/2018: hematocrit 40.1, hemoglobin 13.3, MCV 92.6, platelets 221,000, WBC 5,900. Ferritin 42.  01/20/2019: hematocrit 41.4, hemoglobin 13.4, MCV 94.5, platelets 258,000, WBC 6,400. Ferritin 59.  During the interim, she is doing well. Her energy levels have improved. She continues on omeprazole for reflux. She continues to have chronic joint pain in her right knee.   She continues on oral iron TID, although she was unable to obtain it for a few weeks. She has been eating an iron rich diet. She continues on oral B-12.   Past Medical History:  Diagnosis Date   Arthritis    right knee   Carotid artery occlusion    Dizziness    GERD (gastroesophageal reflux disease)    Heart murmur    History of colon polyps    Hyperlipidemia    Hypertension    Stroke [facility]) 12-31-12   no residual effects    Past Surgical History:  Procedure Laterality Date   CAROTID ENDARTERECTOMY     RIGHT  01/19/13   COLONOSCOPY  08-18-12   COLONOSCOPY WITH PROPOFOL N/A 03/10/2018   Procedure: COLONOSCOPY WITH PROPOFOL;  Surgeon: Lollie Sails, MD;  Location: [facility] ENDOSCOPY;  Service: Endoscopy;  Laterality: N/A;   DILATION AND CURETTAGE OF UTERUS     ENDARTERECTOMY Right 01/19/2013   Procedure: ENDARTERECTOMY CAROTID with patch angioplasty;  Surgeon: Conrad Sterling, MD;  Location: [facility] OR;  Service:  Vascular;  Laterality: Right;   ESOPHAGEAL DILATION     JOINT REPLACEMENT Left 04/15/2017   TUBAL LIGATION     UPPER GI ENDOSCOPY  08-18-12    Family History  Problem Relation Age of Onset   Stroke Mother    Deep vein thrombosis Mother    Heart disease Mother        Amputation-   Hypertension Mother    Alzheimer's disease Mother    Heart failure Father    Heart disease Father        CHF   Hypertension Father    Varicose Veins Father    Parkinson's disease Father    Breast cancer Paternal Aunt 8    Social History:  reports that she has never smoked. She has never used smokeless tobacco. 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 She lives in [place]. Her daughter's name is Amy. She is alone in the clinic today.   Allergies:  Allergies  Allergen Reactions   Adhesive [Tape] Rash    Skin became RAW    Current Medications: Current Outpatient Medications  Medication Sig Dispense Refill   Biotin w/ Vitamins C & E (HAIR SKIN & NAILS GUMMIES PO) Take 2 each by mouth daily.     Calcium Carb-Ergocalciferol (CHEWABLE CALCIUM/D PO) Take 2 tablets by mouth daily.  clopidogrel (PLAVIX) 75 MG tablet TAKE 1 TABLET BY MOUTH DAILY WITH BREAKFAST 90 tablet 1   Iron-Vitamin C 65-125 MG TABS Take 1 tablet by mouth 2 (two) times daily. (Patient taking differently: Take 1 tablet by mouth 3 (three) times daily. ) 60 tablet 1   loratadine (CLARITIN) 10 MG tablet Take 1 tablet (10 mg total) by mouth continuous as needed for allergies. 30 tablet 6   losartan (COZAAR) 50 MG tablet TAKE 1 TABLET BY MOUTH DAILY 90 tablet 1   meclizine (ANTIVERT) 25 MG tablet Take 1 tablet (25 mg total) by mouth 3 (three) times daily as needed for dizziness. 30 tablet 0   Multiple Vitamins-Minerals (MULTIVITAMIN GUMMIES WOMENS) CHEW Chew 1 each by mouth daily.       omeprazole (PRILOSEC) 10 MG capsule Take 1 capsule (10 mg total) by mouth daily. Patient is not sure of doseage- 20mg  qday otc 30 capsule 11   simvastatin (ZOCOR) 20 MG tablet Take 1 tablet (20 mg total) by mouth daily. 90 tablet 1   vitamin B-12 (CYANOCOBALAMIN) 1000 MCG tablet Take 1,000 mcg by mouth daily.     acetaminophen (TYLENOL) 650 MG CR tablet Take 650 mg by mouth every 8 (eight) hours as needed for pain.     Naproxen Sodium (ALEVE PO) Take by mouth continuous as needed.     No current facility-administered medications for this visit.     Review of Systems  Constitutional: Positive for malaise/fatigue (improving). Negative for chills, diaphoresis, fever and weight loss (up 1lb).  HENT: Negative.  Negative for congestion, ear discharge, ear pain, nosebleeds, sinus pain and sore throat.   Eyes: Negative.  Negative for blurred vision, double vision, photophobia and pain.  Respiratory: Negative.  Negative for cough, hemoptysis, sputum production and shortness of breath.   Cardiovascular: Negative.  Negative for chest pain, palpitations, orthopnea, leg swelling and PND.  Gastrointestinal: Negative for abdominal pain, blood in stool, constipation, diarrhea, heartburn (resolved with omeprazole), melena, nausea and vomiting.  Genitourinary: Negative.  Negative for dysuria, frequency, hematuria and urgency.  Musculoskeletal: Positive for joint pain (RIGHT knee). Negative for back pain, falls, myalgias and neck pain.  Skin: Negative.  Negative for itching and rash.  Neurological: Negative for dizziness, tremors, speech change, focal weakness, weakness and headaches.       History of a CVA in 2014.  Endo/Heme/Allergies: Does not bruise/bleed easily.  Psychiatric/Behavioral: Negative for depression and memory loss. The patient is not nervous/anxious and does not have insomnia.   All other systems reviewed and are negative.  Performance status (ECOG): 1  Vitals Blood pressure (!)  154/79, pulse 72, temperature (!) 96.6 F (35.9 C), temperature source Tympanic, resp. rate 18, height 5\' 3"  (1.6 m), weight 228 lb 15.2 oz (103.8 kg), SpO2 100 %.   Physical Exam  Constitutional: She is oriented to person, place, and time. She appears well-developed and well-nourished. No distress.  HENT:  Head: Normocephalic and atraumatic.  Mouth/Throat: Oropharynx is clear and moist. No oropharyngeal exudate.  Short brown hair. Mask.  Eyes: Pupils are equal, round, and reactive to light. Conjunctivae and EOM are normal. No scleral icterus.  Blue eyes. Glasses.  Neck: Normal range of motion. Neck supple. No JVD present.  Cardiovascular: Normal rate, regular rhythm and normal heart sounds.  No murmur heard. Pulmonary/Chest: Effort normal and breath sounds normal. No respiratory distress. She has no wheezes.  S/p carotid endarterectomy  Abdominal: Soft. Bowel sounds are normal. She exhibits no distension. There is no  abdominal tenderness.  Musculoskeletal: Normal range of motion.        General: No edema.  Lymphadenopathy:    She has no cervical adenopathy.    She has no axillary adenopathy.       Right: No supraclavicular adenopathy present.       Left: No supraclavicular adenopathy present.  Neurological: She is alert and oriented to person, place, and time.  Skin: Skin is warm and dry. She is not diaphoretic. No erythema.  Psychiatric: She has a normal mood and affect. Her behavior is normal. Judgment and thought content normal.  Nursing note and vitals reviewed.   Appointment on 01/20/2019  Component Date Value Ref Range Status   WBC 01/20/2019 6.4  4.0 - 10.5 K/uL Final   RBC 01/20/2019 4.38  3.87 - 5.11 MIL/uL Final   Hemoglobin 01/20/2019 13.4  12.0 - 15.0 g/dL Final   HCT 01/20/2019 41.4  36.0 - 46.0 % Final   MCV 01/20/2019 94.5  80.0 - 100.0 fL Final   MCH 01/20/2019 30.6  26.0 - 34.0 pg Final   MCHC 01/20/2019 32.4  30.0 - 36.0 g/dL Final   RDW 01/20/2019  12.1  11.5 - 15.5 % Final   Platelets 01/20/2019 258  150 - 400 K/uL Final   nRBC 01/20/2019 0.0  0.0 - 0.2 % Final   Neutrophils Relative % 01/20/2019 68  % Final   Neutro Abs 01/20/2019 4.4  1.7 - 7.7 K/uL Final   Lymphocytes Relative 01/20/2019 21  % Final   Lymphs Abs 01/20/2019 1.4  0.7 - 4.0 K/uL Final   Monocytes Relative 01/20/2019 8  % Final   Monocytes Absolute 01/20/2019 0.5  0.1 - 1.0 K/uL Final   Eosinophils Relative 01/20/2019 2  % Final   Eosinophils Absolute 01/20/2019 0.1  0.0 - 0.5 K/uL Final   Basophils Relative 01/20/2019 1  % Final   Basophils Absolute 01/20/2019 0.0  0.0 - 0.1 K/uL Final   Immature Granulocytes 01/20/2019 0  % Final   Abs Immature Granulocytes 01/20/2019 0.01  0.00 - 0.07 K/uL Final   Performed at [facility], [address], [place], [place] [address]   Ferritin 01/20/2019 59  11 - 307 ng/mL Final   Performed at [facility], [address] [place], [place] [address]    Assessment:  Ellen Baker is a 68 y.o. female with iron deficiency. Diet appears good. She denies any melena, hematochezia, hematuria or vaginal bleeding. She is on Plavix.  Work-up on 07/21/2018 revealed a hematocrit of 35.4, hemoglobin 10.9, MCV 81.6, platelets 331,000, WBC 6300 with an ANC of 3900.  Ferritin was 8 with an iron saturation of 4% and a TIBC of 529.  B12 was 255 (low normal).  Retic was 1.1%.  Normal studies included folate, TSH, and urinalysis (no hematuria).  She is on Vitron-C.  Ferritin has been followed: 14 on 06/06/2018, 17 on 06/27/2018, 8 on 07/21/2018, 16 on 08/25/2018, 23 on 09/22/2018, 42 on 11/26/2018, and 59 on 01/20/2019.  EGD on 08/18/2012 revealed reflux esophagitis.  Esophageal biopsy at 32 cm revealed columnar mucosa with acute and chronic inflammation.  Esophageal biopsy at 30 cm revealed squamous mucosa with reflux changes and mild chronic inflammation.  There was no dysplasia or  malignancy.  Colonoscopy on 03/10/2018 revealed diverticulosis in the sigmoid colon, in the descending colon, in the transverse colon and in the ascending colon.  There was one 3 mm polyp at the hepatic flexure (tubular adenoma) and  two 4 to 6 mm polyps (hyperplastic) in the sigmoid colon.  She has B12 deficiency.  She started oral B12 on 07/22/2018.  B12 was 255 on 02/03/20200 and 476 on 08/25/2018.  Folate was 35 on 07/21/2018.  Symptomatically, she is doing well.  Exam is unremarkable.  Hemoglobin is 13.4  Plan: 1.   Review labs from 01/20/2019. 2.   Iron deficiency anemia Hematocrit 41.4.  Hemoglobin 13.4.  MCV 94.5. Ferritin 59. Iron stores continue to improve slowly on oral iron.   Continue oral iron.  Ferritin goal 100.  Discuss plan to likely discontinue oral iron after labs check with follow-up to ensure counts stable. 3.   B12 deficiency B12 was 476 on 08/25/2018. Patient on oral B12. Check folate annually. 4.   RTC in 2 months for labs (CBC and ferritin). 5.   RTC in 4 months for MD assessmentand labs (CBC with diff, ferritin- day before).  I discussed the assessment and treatment plan with the patient.  The patient was provided an opportunity to ask questions and all were answered.  The patient agreed with the plan and demonstrated an understanding of the instructions.  The patient was advised to call back if the symptoms worsen or if the condition fails to improve as anticipated.  I provided 15 minutes of face-to-face time during this this encounter and > 50% was spent counseling as documented under my assessment and plan.    Lequita Asal, MD, PhD    01/21/2019, 1:20 PM  I, Molly Dorshimer, am acting as Education administrator for [employer]. Mike Gip, MD, PhD.  I, Marelyn Rouser C. Mike Gip, MD, have reviewed the above documentation for accuracy and completeness, and I agree with the above.

## 2019-01-20 ENCOUNTER — Inpatient Hospital Stay: Payer: PPO | Attending: Hematology and Oncology

## 2019-01-20 DIAGNOSIS — E538 Deficiency of other specified B group vitamins: Secondary | ICD-10-CM | POA: Diagnosis not present

## 2019-01-20 DIAGNOSIS — Z79899 Other long term (current) drug therapy: Secondary | ICD-10-CM | POA: Diagnosis not present

## 2019-01-20 DIAGNOSIS — M25561 Pain in right knee: Secondary | ICD-10-CM | POA: Diagnosis not present

## 2019-01-20 DIAGNOSIS — R011 Cardiac murmur, unspecified: Secondary | ICD-10-CM | POA: Insufficient documentation

## 2019-01-20 DIAGNOSIS — E785 Hyperlipidemia, unspecified: Secondary | ICD-10-CM | POA: Diagnosis not present

## 2019-01-20 DIAGNOSIS — I10 Essential (primary) hypertension: Secondary | ICD-10-CM | POA: Diagnosis not present

## 2019-01-20 DIAGNOSIS — M129 Arthropathy, unspecified: Secondary | ICD-10-CM | POA: Insufficient documentation

## 2019-01-20 DIAGNOSIS — Z86010 Personal history of colonic polyps: Secondary | ICD-10-CM | POA: Diagnosis not present

## 2019-01-20 DIAGNOSIS — E611 Iron deficiency: Secondary | ICD-10-CM

## 2019-01-20 DIAGNOSIS — K219 Gastro-esophageal reflux disease without esophagitis: Secondary | ICD-10-CM | POA: Insufficient documentation

## 2019-01-20 DIAGNOSIS — Z8673 Personal history of transient ischemic attack (TIA), and cerebral infarction without residual deficits: Secondary | ICD-10-CM | POA: Diagnosis not present

## 2019-01-20 DIAGNOSIS — D509 Iron deficiency anemia, unspecified: Secondary | ICD-10-CM | POA: Insufficient documentation

## 2019-01-20 DIAGNOSIS — Z803 Family history of malignant neoplasm of breast: Secondary | ICD-10-CM | POA: Diagnosis not present

## 2019-01-20 LAB — CBC WITH DIFFERENTIAL/PLATELET
Abs Immature Granulocytes: 0.01 10*3/uL (ref 0.00–0.07)
Basophils Absolute: 0 10*3/uL (ref 0.0–0.1)
Basophils Relative: 1 %
Eosinophils Absolute: 0.1 10*3/uL (ref 0.0–0.5)
Eosinophils Relative: 2 %
HCT: 41.4 % (ref 36.0–46.0)
Hemoglobin: 13.4 g/dL (ref 12.0–15.0)
Immature Granulocytes: 0 %
Lymphocytes Relative: 21 %
Lymphs Abs: 1.4 10*3/uL (ref 0.7–4.0)
MCH: 30.6 pg (ref 26.0–34.0)
MCHC: 32.4 g/dL (ref 30.0–36.0)
MCV: 94.5 fL (ref 80.0–100.0)
Monocytes Absolute: 0.5 10*3/uL (ref 0.1–1.0)
Monocytes Relative: 8 %
Neutro Abs: 4.4 10*3/uL (ref 1.7–7.7)
Neutrophils Relative %: 68 %
Platelets: 258 10*3/uL (ref 150–400)
RBC: 4.38 MIL/uL (ref 3.87–5.11)
RDW: 12.1 % (ref 11.5–15.5)
WBC: 6.4 10*3/uL (ref 4.0–10.5)
nRBC: 0 % (ref 0.0–0.2)

## 2019-01-20 LAB — FERRITIN: Ferritin: 59 ng/mL (ref 11–307)

## 2019-01-21 ENCOUNTER — Inpatient Hospital Stay (HOSPITAL_BASED_OUTPATIENT_CLINIC_OR_DEPARTMENT_OTHER): Payer: PPO | Admitting: Hematology and Oncology

## 2019-01-21 ENCOUNTER — Other Ambulatory Visit: Payer: Self-pay

## 2019-01-21 ENCOUNTER — Encounter: Payer: Self-pay | Admitting: Hematology and Oncology

## 2019-01-21 VITALS — BP 154/79 | HR 72 | Temp 96.6°F | Resp 18 | Ht 63.0 in | Wt 228.9 lb

## 2019-01-21 DIAGNOSIS — E611 Iron deficiency: Secondary | ICD-10-CM

## 2019-01-21 DIAGNOSIS — D509 Iron deficiency anemia, unspecified: Secondary | ICD-10-CM | POA: Diagnosis not present

## 2019-01-21 DIAGNOSIS — E538 Deficiency of other specified B group vitamins: Secondary | ICD-10-CM | POA: Diagnosis not present

## 2019-01-21 NOTE — Progress Notes (Signed)
No new changes noted today 

## 2019-02-24 ENCOUNTER — Ambulatory Visit
Admission: RE | Admit: 2019-02-24 | Discharge: 2019-02-24 | Disposition: A | Discharge status: Home / Self Care | Payer: PPO | Source: Ambulatory Visit | Attending: Family Medicine | Admitting: Family Medicine

## 2019-02-24 DIAGNOSIS — Z1231 Encounter for screening mammogram for malignant neoplasm of breast: Secondary | ICD-10-CM | POA: Diagnosis not present

## 2019-03-03 ENCOUNTER — Telehealth: Payer: Self-pay | Admitting: Family Medicine

## 2019-03-03 NOTE — Telephone Encounter (Signed)
°  Called to schedule Medicare Annual Wellness Visit with Nurse Health Advisor, [place].  Pt declined Annual Wellness Visit with [place]  ??[email or web address]   ??[id number]   1Skype

## 2019-03-17 ENCOUNTER — Other Ambulatory Visit: Payer: Self-pay

## 2019-03-18 ENCOUNTER — Inpatient Hospital Stay: Payer: PPO | Attending: Hematology and Oncology

## 2019-03-18 DIAGNOSIS — E611 Iron deficiency: Secondary | ICD-10-CM

## 2019-03-18 DIAGNOSIS — D509 Iron deficiency anemia, unspecified: Secondary | ICD-10-CM | POA: Insufficient documentation

## 2019-03-18 LAB — CBC WITH DIFFERENTIAL/PLATELET
Abs Immature Granulocytes: 0.01 10*3/uL (ref 0.00–0.07)
Basophils Absolute: 0 10*3/uL (ref 0.0–0.1)
Basophils Relative: 1 %
Eosinophils Absolute: 0.2 10*3/uL (ref 0.0–0.5)
Eosinophils Relative: 3 %
HCT: 40.8 % (ref 36.0–46.0)
Hemoglobin: 13.6 g/dL (ref 12.0–15.0)
Immature Granulocytes: 0 %
Lymphocytes Relative: 28 %
Lymphs Abs: 1.7 10*3/uL (ref 0.7–4.0)
MCH: 31.1 pg (ref 26.0–34.0)
MCHC: 33.3 g/dL (ref 30.0–36.0)
MCV: 93.2 fL (ref 80.0–100.0)
Monocytes Absolute: 0.5 10*3/uL (ref 0.1–1.0)
Monocytes Relative: 8 %
Neutro Abs: 3.7 10*3/uL (ref 1.7–7.7)
Neutrophils Relative %: 60 %
Platelets: 238 10*3/uL (ref 150–400)
RBC: 4.38 MIL/uL (ref 3.87–5.11)
RDW: 12.1 % (ref 11.5–15.5)
WBC: 6.1 10*3/uL (ref 4.0–10.5)
nRBC: 0 % (ref 0.0–0.2)

## 2019-03-18 LAB — FERRITIN: Ferritin: 67 ng/mL (ref 11–307)

## 2019-05-05 ENCOUNTER — Other Ambulatory Visit: Payer: Self-pay

## 2019-05-05 ENCOUNTER — Ambulatory Visit (INDEPENDENT_AMBULATORY_CARE_PROVIDER_SITE_OTHER): Payer: PPO

## 2019-05-05 DIAGNOSIS — Z23 Encounter for immunization: Secondary | ICD-10-CM

## 2019-05-11 ENCOUNTER — Other Ambulatory Visit: Payer: Self-pay | Admitting: *Deleted

## 2019-05-11 DIAGNOSIS — Z20822 Contact with and (suspected) exposure to covid-19: Secondary | ICD-10-CM

## 2019-05-13 ENCOUNTER — Other Ambulatory Visit: Payer: Self-pay

## 2019-05-13 LAB — NOVEL CORONAVIRUS, NAA: SARS-CoV-2, NAA: NOT DETECTED

## 2019-05-18 ENCOUNTER — Other Ambulatory Visit: Payer: Self-pay

## 2019-05-18 DIAGNOSIS — Z20822 Contact with and (suspected) exposure to covid-19: Secondary | ICD-10-CM

## 2019-05-19 ENCOUNTER — Other Ambulatory Visit: Payer: PPO

## 2019-05-19 LAB — NOVEL CORONAVIRUS, NAA: SARS-CoV-2, NAA: NOT DETECTED

## 2019-05-20 ENCOUNTER — Ambulatory Visit: Payer: PPO | Admitting: Hematology and Oncology

## 2019-06-01 ENCOUNTER — Inpatient Hospital Stay: Payer: PPO | Attending: Hematology and Oncology

## 2019-06-01 DIAGNOSIS — G8929 Other chronic pain: Secondary | ICD-10-CM | POA: Insufficient documentation

## 2019-06-01 DIAGNOSIS — M25561 Pain in right knee: Secondary | ICD-10-CM | POA: Insufficient documentation

## 2019-06-01 DIAGNOSIS — I10 Essential (primary) hypertension: Secondary | ICD-10-CM | POA: Insufficient documentation

## 2019-06-01 DIAGNOSIS — R5383 Other fatigue: Secondary | ICD-10-CM | POA: Insufficient documentation

## 2019-06-01 DIAGNOSIS — K219 Gastro-esophageal reflux disease without esophagitis: Secondary | ICD-10-CM | POA: Insufficient documentation

## 2019-06-01 DIAGNOSIS — M129 Arthropathy, unspecified: Secondary | ICD-10-CM | POA: Insufficient documentation

## 2019-06-01 DIAGNOSIS — R011 Cardiac murmur, unspecified: Secondary | ICD-10-CM | POA: Insufficient documentation

## 2019-06-01 DIAGNOSIS — D509 Iron deficiency anemia, unspecified: Secondary | ICD-10-CM | POA: Insufficient documentation

## 2019-06-01 DIAGNOSIS — E538 Deficiency of other specified B group vitamins: Secondary | ICD-10-CM | POA: Insufficient documentation

## 2019-06-01 DIAGNOSIS — Z8673 Personal history of transient ischemic attack (TIA), and cerebral infarction without residual deficits: Secondary | ICD-10-CM | POA: Insufficient documentation

## 2019-06-01 DIAGNOSIS — E785 Hyperlipidemia, unspecified: Secondary | ICD-10-CM | POA: Insufficient documentation

## 2019-06-01 DIAGNOSIS — Z803 Family history of malignant neoplasm of breast: Secondary | ICD-10-CM | POA: Insufficient documentation

## 2019-06-01 DIAGNOSIS — Z79899 Other long term (current) drug therapy: Secondary | ICD-10-CM | POA: Insufficient documentation

## 2019-06-01 DIAGNOSIS — R159 Full incontinence of feces: Secondary | ICD-10-CM | POA: Insufficient documentation

## 2019-06-01 DIAGNOSIS — Z86010 Personal history of colonic polyps: Secondary | ICD-10-CM | POA: Insufficient documentation

## 2019-06-02 ENCOUNTER — Inpatient Hospital Stay: Payer: PPO

## 2019-06-02 ENCOUNTER — Inpatient Hospital Stay: Payer: PPO | Admitting: Oncology

## 2019-06-02 ENCOUNTER — Other Ambulatory Visit: Payer: PPO

## 2019-06-02 DIAGNOSIS — M129 Arthropathy, unspecified: Secondary | ICD-10-CM | POA: Diagnosis not present

## 2019-06-02 DIAGNOSIS — Z86010 Personal history of colonic polyps: Secondary | ICD-10-CM | POA: Diagnosis not present

## 2019-06-02 DIAGNOSIS — K219 Gastro-esophageal reflux disease without esophagitis: Secondary | ICD-10-CM | POA: Diagnosis not present

## 2019-06-02 DIAGNOSIS — E538 Deficiency of other specified B group vitamins: Secondary | ICD-10-CM | POA: Diagnosis not present

## 2019-06-02 DIAGNOSIS — E611 Iron deficiency: Secondary | ICD-10-CM

## 2019-06-02 DIAGNOSIS — D509 Iron deficiency anemia, unspecified: Secondary | ICD-10-CM | POA: Diagnosis not present

## 2019-06-02 DIAGNOSIS — M25561 Pain in right knee: Secondary | ICD-10-CM | POA: Diagnosis not present

## 2019-06-02 DIAGNOSIS — Z8673 Personal history of transient ischemic attack (TIA), and cerebral infarction without residual deficits: Secondary | ICD-10-CM | POA: Diagnosis not present

## 2019-06-02 DIAGNOSIS — R011 Cardiac murmur, unspecified: Secondary | ICD-10-CM | POA: Diagnosis not present

## 2019-06-02 DIAGNOSIS — E785 Hyperlipidemia, unspecified: Secondary | ICD-10-CM | POA: Diagnosis not present

## 2019-06-02 DIAGNOSIS — I10 Essential (primary) hypertension: Secondary | ICD-10-CM | POA: Diagnosis not present

## 2019-06-02 DIAGNOSIS — Z79899 Other long term (current) drug therapy: Secondary | ICD-10-CM | POA: Diagnosis not present

## 2019-06-02 DIAGNOSIS — G8929 Other chronic pain: Secondary | ICD-10-CM | POA: Diagnosis not present

## 2019-06-02 DIAGNOSIS — R5383 Other fatigue: Secondary | ICD-10-CM | POA: Diagnosis not present

## 2019-06-02 DIAGNOSIS — Z803 Family history of malignant neoplasm of breast: Secondary | ICD-10-CM | POA: Diagnosis not present

## 2019-06-02 DIAGNOSIS — R159 Full incontinence of feces: Secondary | ICD-10-CM | POA: Diagnosis not present

## 2019-06-02 LAB — CBC WITH DIFFERENTIAL/PLATELET
Abs Immature Granulocytes: 0.02 10*3/uL (ref 0.00–0.07)
Basophils Absolute: 0 10*3/uL (ref 0.0–0.1)
Basophils Relative: 1 %
Eosinophils Absolute: 0.2 10*3/uL (ref 0.0–0.5)
Eosinophils Relative: 3 %
HCT: 39.1 % (ref 36.0–46.0)
Hemoglobin: 13.1 g/dL (ref 12.0–15.0)
Immature Granulocytes: 0 %
Lymphocytes Relative: 27 %
Lymphs Abs: 1.7 10*3/uL (ref 0.7–4.0)
MCH: 31.3 pg (ref 26.0–34.0)
MCHC: 33.5 g/dL (ref 30.0–36.0)
MCV: 93.5 fL (ref 80.0–100.0)
Monocytes Absolute: 0.4 10*3/uL (ref 0.1–1.0)
Monocytes Relative: 7 %
Neutro Abs: 4 10*3/uL (ref 1.7–7.7)
Neutrophils Relative %: 62 %
Platelets: 232 10*3/uL (ref 150–400)
RBC: 4.18 MIL/uL (ref 3.87–5.11)
RDW: 12.3 % (ref 11.5–15.5)
WBC: 6.4 10*3/uL (ref 4.0–10.5)
nRBC: 0 % (ref 0.0–0.2)

## 2019-06-02 LAB — FERRITIN: Ferritin: 82 ng/mL (ref 11–307)

## 2019-06-03 ENCOUNTER — Other Ambulatory Visit: Payer: Self-pay

## 2019-06-03 ENCOUNTER — Inpatient Hospital Stay: Payer: PPO | Admitting: Oncology

## 2019-06-03 VITALS — BP 151/86 | HR 72 | Temp 98.0°F | Resp 18 | Wt 229.6 lb

## 2019-06-03 DIAGNOSIS — E611 Iron deficiency: Secondary | ICD-10-CM | POA: Diagnosis not present

## 2019-06-03 DIAGNOSIS — D509 Iron deficiency anemia, unspecified: Secondary | ICD-10-CM | POA: Diagnosis not present

## 2019-06-03 NOTE — Progress Notes (Signed)
[facility]  [address], [address] [place], [place] [address] Phone: [phone]  Fax: [phone]   Clinic Day:  06/03/2019  Referring physician: Juline Patch, MD  Chief Complaint: Ellen Baker is a 68 y.o. female with iron deficiency anemia and B12 deficiency who is evaluated for 6 month assessment.  HPI: The patient was last seen in the hematology clinic on 01/21/19. At that time, she was doing well.  Her energy levels had improved.  She continued omeprazole for reflux.  She complained of chronic joint pain in her right knee.  Lab work was stable and her ferritin was improving.  She continued oral iron 3 times daily.  Labs followed: 11/26/2018: hematocrit 40.1, hemoglobin 13.3, MCV 92.6, platelets 221,000, WBC 5,900. Ferritin 42.  01/20/2019: hematocrit 41.4, hemoglobin 13.4, MCV 94.5, platelets 258,000, WBC 6,400. Ferritin 59. 06/03/19: Hematocrit 39.1, hemoglobin 13.1, MCV 31.3, platelets 232,000, WBC 6400.  Ferritin 82.  During the interim, she has been fatigued.  States "I am always tired".  She has been compliant with taking her oral iron 3 times daily.  Has noted some abdominal discomfort with taking iron 3 times daily versus taking it twice daily.  Has had a few episodes of bowel incontinence.  She continues to eat and iron rich diet.  She continues on oral B12.  Past Medical History:  Diagnosis Date  . Arthritis    right knee  . Carotid artery occlusion   . Dizziness   . GERD (gastroesophageal reflux disease)   . Heart murmur   . History of colon polyps   . Hyperlipidemia   . Hypertension   . Stroke [facility]) 12-31-12   no residual effects    Past Surgical History:  Procedure Laterality Date  . CAROTID ENDARTERECTOMY     RIGHT  01/19/13  . COLONOSCOPY  08-18-12  . COLONOSCOPY WITH PROPOFOL N/A 03/10/2018   Procedure: COLONOSCOPY WITH PROPOFOL;  Surgeon: Lollie Sails, MD;  Location: [facility] ENDOSCOPY;  Service: Endoscopy;   Laterality: N/A;  . DILATION AND CURETTAGE OF UTERUS    . ENDARTERECTOMY Right 01/19/2013   Procedure: ENDARTERECTOMY CAROTID with patch angioplasty;  Surgeon: Conrad [place], MD;  Location: [facility];  Service: Vascular;  Laterality: Right;  . ESOPHAGEAL DILATION    . JOINT REPLACEMENT Left 04/15/2017  . TUBAL LIGATION    . UPPER GI ENDOSCOPY  08-18-12    Family History  Problem Relation Age of Onset  . Stroke Mother   . Deep vein thrombosis Mother   . Heart disease Mother        Amputation-  . Hypertension Mother   . Alzheimer's disease Mother   . Heart failure Father   . Heart disease Father        CHF  . Hypertension Father   . Varicose Veins Father   . Parkinson's disease Father   . Breast cancer Paternal Aunt 71    Social History:  reports that she has never smoked. She has never used smokeless tobacco. 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 She lives in [place]. Her daughter's name is Amy. She is alone in the clinic today.   Allergies:  Allergies  Allergen Reactions  . Adhesive [Tape] Rash    Skin became RAW    Current Medications: Current Outpatient Medications  Medication Sig Dispense Refill  .  acetaminophen (TYLENOL) 650 MG CR tablet Take 650 mg by mouth every 8 (eight) hours as needed for pain.    . Biotin w/ Vitamins C & E (HAIR SKIN & NAILS GUMMIES PO) Take 2 each by mouth daily.    . Calcium Carb-Ergocalciferol (CHEWABLE CALCIUM/D PO) Take 2 tablets by mouth daily.     . clopidogrel (PLAVIX) 75 MG tablet TAKE 1 TABLET BY MOUTH DAILY WITH BREAKFAST 90 tablet 1  . Iron-Vitamin C 65-125 MG TABS Take 1 tablet by mouth 2 (two) times daily. (Patient taking differently: Take 1 tablet by mouth 3 (three) times daily. ) 60 tablet 1  . loratadine (CLARITIN) 10 MG tablet Take 1 tablet (10 mg total) by mouth continuous as needed for allergies. 30  tablet 6  . losartan (COZAAR) 50 MG tablet TAKE 1 TABLET BY MOUTH DAILY 90 tablet 1  . meclizine (ANTIVERT) 25 MG tablet Take 1 tablet (25 mg total) by mouth 3 (three) times daily as needed for dizziness. 30 tablet 0  . Multiple Vitamins-Minerals (MULTIVITAMIN GUMMIES WOMENS) CHEW Chew 1 each by mouth daily.    . Naproxen Sodium (ALEVE PO) Take by mouth continuous as needed.    Marland Kitchen omeprazole (PRILOSEC) 10 MG capsule Take 1 capsule (10 mg total) by mouth daily. Patient is not sure of doseage- 20mg  qday otc 30 capsule 11  . simvastatin (ZOCOR) 20 MG tablet Take 1 tablet (20 mg total) by mouth daily. 90 tablet 1  . vitamin B-12 (CYANOCOBALAMIN) 1000 MCG tablet Take 1,000 mcg by mouth daily.     No current facility-administered medications for this visit.    Review of Systems  Constitutional: Positive for malaise/fatigue. Negative for chills, fever and weight loss.  HENT: Negative for congestion, ear pain and tinnitus.   Eyes: Negative.  Negative for blurred vision and double vision.  Respiratory: Negative.  Negative for cough, sputum production and shortness of breath.   Cardiovascular: Negative.  Negative for chest pain, palpitations and leg swelling.  Gastrointestinal: Negative.  Negative for abdominal pain, constipation, diarrhea, nausea and vomiting.  Genitourinary: Negative for dysuria, frequency and urgency.  Musculoskeletal: Positive for joint pain. Negative for back pain and falls.  Skin: Negative.  Negative for rash.  Neurological: Negative.  Negative for weakness and headaches.  Endo/Heme/Allergies: Negative.  Does not bruise/bleed easily.  Psychiatric/Behavioral: Negative.  Negative for depression. The patient is not nervous/anxious and does not have insomnia.    Performance status (ECOG): 1  Vitals There were no vitals taken for this visit.   Physical Exam  Constitutional: She is oriented to person, place, and time. She appears well-developed and well-nourished. No distress.    HENT:  Head: Normocephalic and atraumatic.  Mouth/Throat: Oropharynx is clear and moist. No oropharyngeal exudate.  Short brown hair. Mask.  Eyes: Pupils are equal, round, and reactive to light. Conjunctivae and EOM are normal. No scleral icterus.  Blue eyes. Glasses.  Neck: No JVD present.  Cardiovascular: Normal rate, regular rhythm and normal heart sounds.  No murmur heard. Pulmonary/Chest: Effort normal and breath sounds normal. No respiratory distress. She has no wheezes.  S/p carotid endarterectomy  Abdominal: Soft. Bowel sounds are normal. She exhibits no distension. There is no abdominal tenderness.  Musculoskeletal:        General: No edema. Normal range of motion.     Cervical back: Normal range of motion and neck supple.  Lymphadenopathy:    She has no cervical adenopathy.    She has no axillary adenopathy.  Right: No supraclavicular adenopathy present.       Left: No supraclavicular adenopathy present.  Neurological: She is alert and oriented to person, place, and time.  Skin: Skin is warm and dry. She is not diaphoretic. No erythema.  Psychiatric: She has a normal mood and affect. Her behavior is normal. Judgment and thought content normal.  Nursing note and vitals reviewed.   Appointment on 06/02/2019  Component Date Value Ref Range Status  . Ferritin 06/02/2019 82  11 - 307 ng/mL Final   Performed at [facility], [address] [place], [place] [address]  . WBC 06/02/2019 6.4  4.0 - 10.5 K/uL Final  . RBC 06/02/2019 4.18  3.87 - 5.11 MIL/uL Final  . Hemoglobin 06/02/2019 13.1  12.0 - 15.0 g/dL Final  . HCT 06/02/2019 39.1  36.0 - 46.0 % Final  . MCV 06/02/2019 93.5  80.0 - 100.0 fL Final  . MCH 06/02/2019 31.3  26.0 - 34.0 pg Final  . MCHC 06/02/2019 33.5  30.0 - 36.0 g/dL Final  . RDW 06/02/2019 12.3  11.5 - 15.5 % Final  . Platelets 06/02/2019 232  150 - 400 K/uL Final  . nRBC 06/02/2019 0.0  0.0 - 0.2 % Final  . Neutrophils Relative %  06/02/2019 62  % Final  . Neutro Abs 06/02/2019 4.0  1.7 - 7.7 K/uL Final  . Lymphocytes Relative 06/02/2019 27  % Final  . Lymphs Abs 06/02/2019 1.7  0.7 - 4.0 K/uL Final  . Monocytes Relative 06/02/2019 7  % Final  . Monocytes Absolute 06/02/2019 0.4  0.1 - 1.0 K/uL Final  . Eosinophils Relative 06/02/2019 3  % Final  . Eosinophils Absolute 06/02/2019 0.2  0.0 - 0.5 K/uL Final  . Basophils Relative 06/02/2019 1  % Final  . Basophils Absolute 06/02/2019 0.0  0.0 - 0.1 K/uL Final  . Immature Granulocytes 06/02/2019 0  % Final  . Abs Immature Granulocytes 06/02/2019 0.02  0.00 - 0.07 K/uL Final   Performed at [facility], [address] [place], [place] [address]    Assessment:  [place] is a 68 y.o. female with iron deficiency. Diet appears good. She denies any melena, hematochezia, hematuria or vaginal bleeding. She is on Plavix.  Work-up on 07/21/2018 revealed a hematocrit of 35.4, hemoglobin 10.9, MCV 81.6, platelets 331,000, WBC 6300 with an ANC of 3900.  Ferritin was 8 with an iron saturation of 4% and a TIBC of 529.  B12 was 255 (low normal).  Retic was 1.1%.  Normal studies included folate, TSH, and urinalysis (no hematuria).  She is on Vitron-C.  Ferritin has been followed: 14 on 06/06/2018, 17 on 06/27/2018, 8 on 07/21/2018, 16 on 08/25/2018, 23 on 09/22/2018, 42 on 11/26/2018, and 59 on 01/20/2019.  EGD on 08/18/2012 revealed reflux esophagitis.  Esophageal biopsy at 32 cm revealed columnar mucosa with acute and chronic inflammation.  Esophageal biopsy at 30 cm revealed squamous mucosa with reflux changes and mild chronic inflammation.  There was no dysplasia or malignancy.  Colonoscopy on 03/10/2018 revealed diverticulosis in the sigmoid colon, in the descending colon, in the transverse colon and in the ascending colon.  There was one 3 mm polyp at the hepatic flexure (tubular adenoma) and two 4 to 6 mm polyps (hyperplastic) in the sigmoid  colon.  She has B12 deficiency.  She started oral B12 on 07/22/2018.  B12 was 255 on 02/03/20200 and 476 on 08/25/2018.  Folate was 35 on 07/21/2018.  Symptomatically,  she is doing well.  Exam is unremarkable.  Hemoglobin is 13.4  Plan: 1.   Review labs from 06/02/19 2.   Iron deficiency anemia Hematocrit 39.1.  Hemoglobin 13.1.  MCV 93.5. Ferritin 82 Iron stores continue to improve slowly on oral iron.  Having stool incontinence due to increased oral iron from twice daily to 3 times daily.  Suggest reducing oral iron dose back to twice daily given significant improvement in lab work. Continue oral iron.  Ferritin goal 100.  Discuss plan to likely discontinue oral iron after labs check with follow-up to ensure counts stable. 3.   B12 deficiency B12 was 476 on 08/25/2018. Patient on oral B12. Check folate annually. 4.   RTC in 2 months for labs (CBC and ferritin). 5.   RTC in 4 months for MD assessmentand labs (CBC with diff, ferritin- day before).  I discussed the assessment and treatment plan with the patient.  The patient was provided an opportunity to ask questions and all were answered.  The patient agreed with the plan and demonstrated an understanding of the instructions.  The patient was advised to call back if the symptoms worsen or if the condition fails to improve as anticipated.  Greater than 50% was spent in counseling and coordination of care with this patient including but not limited to discussion of the relevant topics above (See A&P) including, but not limited to diagnosis and management of acute and chronic medical conditions.   Faythe Casa, NP 06/03/2019 3:31 PM

## 2019-06-03 NOTE — Progress Notes (Signed)
Patient here for follow up. Reports she has been fatigued. Denies any other concerns.

## 2019-06-15 ENCOUNTER — Telehealth: Payer: Self-pay

## 2019-06-15 NOTE — Telephone Encounter (Signed)
Pt called in asking if she should get tested for covid- her Daughter's granddaughter's grandmother tested positive. I went over with her the chances of her having it are very slim. She is asymptomatic. She is coming in tomorrow for office visit

## 2019-06-16 ENCOUNTER — Encounter: Payer: Self-pay | Admitting: Family Medicine

## 2019-06-16 ENCOUNTER — Ambulatory Visit (INDEPENDENT_AMBULATORY_CARE_PROVIDER_SITE_OTHER): Payer: PPO | Admitting: Family Medicine

## 2019-06-16 ENCOUNTER — Other Ambulatory Visit: Payer: Self-pay

## 2019-06-16 VITALS — BP 130/80 | HR 60 | Ht 63.0 in | Wt 227.0 lb

## 2019-06-16 DIAGNOSIS — I10 Essential (primary) hypertension: Secondary | ICD-10-CM

## 2019-06-16 DIAGNOSIS — E785 Hyperlipidemia, unspecified: Secondary | ICD-10-CM

## 2019-06-16 DIAGNOSIS — I679 Cerebrovascular disease, unspecified: Secondary | ICD-10-CM

## 2019-06-16 MED ORDER — CLOPIDOGREL BISULFATE 75 MG PO TABS: ORAL_TABLET | ORAL | 1 refills | Status: DC

## 2019-06-16 MED ORDER — LOSARTAN POTASSIUM 50 MG PO TABS: ORAL_TABLET | ORAL | 1 refills | Status: DC

## 2019-06-16 MED ORDER — SIMVASTATIN 20 MG PO TABS: 20.0000 mg | ORAL_TABLET | Freq: Every day | ORAL | 1 refills | Status: DC

## 2019-06-16 NOTE — Progress Notes (Signed)
Date:  06/16/2019   Name:  Ellen Baker   DOB:  August 18, 1950   MRN:  [id number]   Chief Complaint: Hypertension, Hyperlipidemia, and cerebral vascular disease  Hypertension This is a chronic problem. The current episode started more than 1 year ago. The problem has been gradually improving since onset. The problem is controlled. Pertinent negatives include no anxiety, blurred vision, chest pain, headaches, malaise/fatigue, neck pain, orthopnea, palpitations, peripheral edema, PND, shortness of breath or sweats. There are no associated agents to hypertension. Risk factors for coronary artery disease include dyslipidemia and obesity. Past treatments include angiotensin blockers. The current treatment provides moderate improvement. There are no compliance problems.  Hypertensive end-organ damage includes CVA. There is no history of angina, kidney disease, CAD/MI, heart failure, left ventricular hypertrophy, PVD or retinopathy. There is no history of chronic renal disease, a hypertension causing med or renovascular disease.  Hyperlipidemia This is a chronic problem. The current episode started more than 1 year ago. The problem is controlled. Recent lipid tests were reviewed and are normal. Exacerbating diseases include obesity. She has no history of chronic renal disease, diabetes, hypothyroidism, liver disease or nephrotic syndrome. Pertinent negatives include no chest pain, focal sensory loss, focal weakness, leg pain, myalgias or shortness of breath. Current antihyperlipidemic treatment includes statins. The current treatment provides moderate improvement of lipids. There are no compliance problems.  Risk factors for coronary artery disease include hypertension, dyslipidemia and obesity.  Neurologic Problem The patient's pertinent negatives include no altered mental status, clumsiness, focal sensory loss, focal weakness, loss of balance, memory loss, near-syncope, slurred speech, syncope, visual  change or weakness. Primary symptoms comment: history of cva. The current episode started more than 1 year ago. The problem has been gradually improving since onset. Pertinent negatives include no abdominal pain, auditory change, aura, back pain, bladder incontinence, bowel incontinence, chest pain, confusion, diaphoresis, dizziness, fatigue, fever, headaches, light-headedness, nausea, neck pain, palpitations, shortness of breath, vertigo or vomiting. Past treatments include nothing. The treatment provided moderate relief. There is no history of liver disease.    Lab Results  Component Value Date   CREATININE 0.68 12/10/2018   BUN 10 12/10/2018   NA 143 12/10/2018   K 4.8 12/10/2018   CL 104 12/10/2018   CO2 26 12/10/2018   Lab Results  Component Value Date   CHOL 188 12/10/2018   HDL 64 12/10/2018   LDLCALC 107 (H) 12/10/2018   TRIG 84 12/10/2018   CHOLHDL 2.6 12/05/2017   Lab Results  Component Value Date   TSH 1.757 07/21/2018   Lab Results  Component Value Date   HGBA1C 5.3 01/01/2013     Review of Systems  Constitutional: Negative for chills, diaphoresis, fatigue, fever and malaise/fatigue.  HENT: Negative for drooling, ear discharge, ear pain and sore throat.   Eyes: Negative for blurred vision.  Respiratory: Negative for cough, shortness of breath and wheezing.   Cardiovascular: Negative for chest pain, palpitations, orthopnea, leg swelling, PND and near-syncope.  Gastrointestinal: Negative for abdominal pain, blood in stool, bowel incontinence, constipation, diarrhea, nausea and vomiting.  Endocrine: Negative for polydipsia.  Genitourinary: Negative for bladder incontinence, dysuria, frequency, hematuria and urgency.  Musculoskeletal: Negative for back pain, myalgias and neck pain.  Skin: Negative for rash.  Allergic/Immunologic: Negative for environmental allergies.  Neurological: Negative for dizziness, vertigo, focal weakness, syncope, weakness, light-headedness,  headaches and loss of balance.  Hematological: Does not bruise/bleed easily.  Psychiatric/Behavioral: Negative for confusion, memory loss and suicidal ideas. The  patient is not nervous/anxious.     Patient Active Problem List   Diagnosis Date Noted  . B12 deficiency 09/24/2018  . Normocytic anemia 07/20/2018  . Cerebral vascular insufficiency 06/06/2015  . Dyslipidemia 06/06/2015  . Aftercare following surgery of the circulatory system, [place] 08/03/2013  . Occlusion and stenosis of carotid artery without mention of cerebral infarction 01/16/2013  . Pre-operative cardiovascular examination 01/16/2013  . Carotid artery stenosis, symptomatic 01/02/2013  . CVA (cerebral infarction) 12/31/2012  . HTN (hypertension) 12/31/2012  . GERD (gastroesophageal reflux disease) 12/31/2012    Allergies  Allergen Reactions  . Adhesive [Tape] Rash    Skin became RAW    Past Surgical History:  Procedure Laterality Date  . CAROTID ENDARTERECTOMY     RIGHT  01/19/13  . COLONOSCOPY  08-18-12  . COLONOSCOPY WITH PROPOFOL N/A 03/10/2018   Procedure: COLONOSCOPY WITH PROPOFOL;  Surgeon: Lollie Sails, MD;  Location: [facility] ENDOSCOPY;  Service: Endoscopy;  Laterality: N/A;  . DILATION AND CURETTAGE OF UTERUS    . ENDARTERECTOMY Right 01/19/2013   Procedure: ENDARTERECTOMY CAROTID with patch angioplasty;  Surgeon: Conrad Dougherty, MD;  Location: [place];  Service: Vascular;  Laterality: Right;  . ESOPHAGEAL DILATION    . JOINT REPLACEMENT Left 04/15/2017  . TUBAL LIGATION    . UPPER GI ENDOSCOPY  08-18-12    Social History   Tobacco Use  . Smoking status: Never Smoker  . Smokeless tobacco: Never Used  . Tobacco comment: Smoking cessation materials not required  Substance Use Topics  . Alcohol use: No    Alcohol/week: 0.0 standard drinks  . Drug use: No     Medication list has been reviewed and updated.  Current Meds  Medication Sig  . acetaminophen (TYLENOL) 650 MG CR tablet Take 650 mg by mouth  every 8 (eight) hours as needed for pain.  . Biotin w/ Vitamins C & E (HAIR SKIN & NAILS GUMMIES PO) Take 2 each by mouth daily.  . Calcium Carb-Ergocalciferol (CHEWABLE CALCIUM/D PO) Take 2 tablets by mouth daily.   . clopidogrel (PLAVIX) 75 MG tablet TAKE 1 TABLET BY MOUTH DAILY WITH BREAKFAST  . Iron-Vitamin C 65-125 MG TABS Take 1 tablet by mouth 2 (two) times daily. (Patient taking differently: Take 1 tablet by mouth daily. )  . loratadine (CLARITIN) 10 MG tablet Take 1 tablet (10 mg total) by mouth continuous as needed for allergies.  Marland Kitchen losartan (COZAAR) 50 MG tablet TAKE 1 TABLET BY MOUTH DAILY  . meclizine (ANTIVERT) 25 MG tablet Take 1 tablet (25 mg total) by mouth 3 (three) times daily as needed for dizziness.  . Multiple Vitamins-Minerals (MULTIVITAMIN GUMMIES WOMENS) CHEW Chew 1 each by mouth daily.  . Naproxen Sodium (ALEVE PO) Take by mouth continuous as needed.  Marland Kitchen omeprazole (PRILOSEC) 10 MG capsule Take 1 capsule (10 mg total) by mouth daily. Patient is not sure of doseage- 20mg  qday otc  . simvastatin (ZOCOR) 20 MG tablet Take 1 tablet (20 mg total) by mouth daily.  . vitamin B-12 (CYANOCOBALAMIN) 1000 MCG tablet Take 1,000 mcg by mouth daily.    PHQ 2/9 Scores 06/16/2019 06/06/2018 12/09/2017 12/05/2017  PHQ - 2 Score 0 0 0 0  PHQ- 9 Score 0 0 0 -    BP Readings from Last 3 Encounters:  06/16/19 130/80  06/03/19 (!) 151/86  01/21/19 (!) 154/79    Physical Exam Vitals and nursing note reviewed.  Constitutional:      Appearance: She is well-developed.  HENT:     Head: Normocephalic.     Right Ear: External ear normal.     Left Ear: External ear normal.     Nose: Nose normal.     Mouth/Throat:     Mouth: Mucous membranes are moist.  Eyes:     General: Lids are everted, no foreign bodies appreciated. No scleral icterus.       Left eye: No foreign body or hordeolum.     Conjunctiva/sclera: Conjunctivae normal.     Right eye: Right conjunctiva is not injected.      Left eye: Left conjunctiva is not injected.     Pupils: Pupils are equal, round, and reactive to light.  Neck:     Thyroid: No thyromegaly.     Vascular: No JVD.     Trachea: No tracheal deviation.  Cardiovascular:     Rate and Rhythm: Normal rate and regular rhythm.     Heart sounds: Normal heart sounds. No murmur. No friction rub. No gallop.   Pulmonary:     Effort: Pulmonary effort is normal. No respiratory distress.     Breath sounds: Normal breath sounds. No wheezing, rhonchi or rales.  Abdominal:     General: Bowel sounds are normal.     Palpations: Abdomen is soft. There is no mass.     Tenderness: There is no abdominal tenderness. There is no guarding or rebound.  Musculoskeletal:        General: No tenderness. Normal range of motion.     Cervical back: Normal range of motion and neck supple.  Lymphadenopathy:     Cervical: No cervical adenopathy.  Skin:    General: Skin is warm.     Findings: No rash.  Neurological:     Mental Status: She is alert and oriented to person, place, and time.     Cranial Nerves: No cranial nerve deficit.     Deep Tendon Reflexes: Reflexes normal.  Psychiatric:        Mood and Affect: Mood is not anxious or depressed.     Wt Readings from Last 3 Encounters:  06/16/19 227 lb (103 kg)  06/03/19 229 lb 9.8 oz (104.2 kg)  01/21/19 228 lb 15.2 oz (103.8 kg)    BP 130/80   Pulse 60   Ht 5\' 3"  (1.6 m)   Wt 227 lb (103 kg)   BMI 40.21 kg/m   Assessment and Plan:  1. Cerebral vascular insufficiency Patient.  Evaluation status post cerebrovascular issue in the past.  Patient is controlled on Plavix 75 mg once a day.  Reviewed CBC from hematology and that the platelet count is sufficient. - clopidogrel (PLAVIX) 75 MG tablet; TAKE 1 TABLET BY MOUTH DAILY WITH BREAKFAST  Dispense: 90 tablet; Refill: 1  2. Dyslipidemia Chronic.  Controlled.  Stable.  Will continue simvastatin 20 mg once a day.  Lipid panel from June 2020 was reviewed and  controlled at that time and we will hold on lab work until 38-month recheck - simvastatin (ZOCOR) 20 MG tablet; Take 1 tablet (20 mg total) by mouth daily.  Dispense: 90 tablet; Refill: 1  3. Essential hypertension Chronic.  Controlled.  Stable.  Blood pressure in normal range.  Patient tolerating medication well and will continue losartan 50 mg once a day.  Review of renal function panel from last year was unremarkable and will hold and repeat in 6 months. - losartan (COZAAR) 50 MG tablet; TAKE 1 TABLET BY MOUTH DAILY  Dispense: 90 tablet;  Refill: 1

## 2019-06-19 HISTORY — PX: OTHER SURGICAL HISTORY: SHX169

## 2019-08-03 ENCOUNTER — Other Ambulatory Visit: Payer: Self-pay

## 2019-08-04 ENCOUNTER — Inpatient Hospital Stay: Payer: PPO | Attending: Hematology and Oncology

## 2019-08-04 DIAGNOSIS — E611 Iron deficiency: Secondary | ICD-10-CM

## 2019-08-04 DIAGNOSIS — D509 Iron deficiency anemia, unspecified: Secondary | ICD-10-CM | POA: Diagnosis not present

## 2019-08-04 LAB — CBC WITH DIFFERENTIAL/PLATELET
Abs Immature Granulocytes: 0.01 10*3/uL (ref 0.00–0.07)
Basophils Absolute: 0 10*3/uL (ref 0.0–0.1)
Basophils Relative: 1 %
Eosinophils Absolute: 0.2 10*3/uL (ref 0.0–0.5)
Eosinophils Relative: 3 %
HCT: 40.1 % (ref 36.0–46.0)
Hemoglobin: 13.3 g/dL (ref 12.0–15.0)
Immature Granulocytes: 0 %
Lymphocytes Relative: 27 %
Lymphs Abs: 1.6 10*3/uL (ref 0.7–4.0)
MCH: 31 pg (ref 26.0–34.0)
MCHC: 33.2 g/dL (ref 30.0–36.0)
MCV: 93.5 fL (ref 80.0–100.0)
Monocytes Absolute: 0.5 10*3/uL (ref 0.1–1.0)
Monocytes Relative: 8 %
Neutro Abs: 3.7 10*3/uL (ref 1.7–7.7)
Neutrophils Relative %: 61 %
Platelets: 262 10*3/uL (ref 150–400)
RBC: 4.29 MIL/uL (ref 3.87–5.11)
RDW: 12.1 % (ref 11.5–15.5)
WBC: 6 10*3/uL (ref 4.0–10.5)
nRBC: 0 % (ref 0.0–0.2)

## 2019-08-04 LAB — FERRITIN: Ferritin: 69 ng/mL (ref 11–307)

## 2019-08-09 ENCOUNTER — Encounter: Payer: Self-pay | Admitting: Hematology and Oncology

## 2019-10-05 ENCOUNTER — Inpatient Hospital Stay: Payer: PPO | Attending: Hematology and Oncology

## 2019-10-05 ENCOUNTER — Encounter: Payer: Self-pay | Admitting: Hematology and Oncology

## 2019-10-05 ENCOUNTER — Other Ambulatory Visit: Payer: Self-pay

## 2019-10-05 DIAGNOSIS — D509 Iron deficiency anemia, unspecified: Secondary | ICD-10-CM | POA: Insufficient documentation

## 2019-10-05 DIAGNOSIS — E611 Iron deficiency: Secondary | ICD-10-CM

## 2019-10-05 LAB — CBC WITH DIFFERENTIAL/PLATELET
Abs Immature Granulocytes: 0.03 10*3/uL (ref 0.00–0.07)
Basophils Absolute: 0 10*3/uL (ref 0.0–0.1)
Basophils Relative: 1 %
Eosinophils Absolute: 0.2 10*3/uL (ref 0.0–0.5)
Eosinophils Relative: 3 %
HCT: 40.1 % (ref 36.0–46.0)
Hemoglobin: 13.4 g/dL (ref 12.0–15.0)
Immature Granulocytes: 1 %
Lymphocytes Relative: 23 %
Lymphs Abs: 1.5 10*3/uL (ref 0.7–4.0)
MCH: 30.9 pg (ref 26.0–34.0)
MCHC: 33.4 g/dL (ref 30.0–36.0)
MCV: 92.4 fL (ref 80.0–100.0)
Monocytes Absolute: 0.4 10*3/uL (ref 0.1–1.0)
Monocytes Relative: 6 %
Neutro Abs: 4.4 10*3/uL (ref 1.7–7.7)
Neutrophils Relative %: 66 %
Platelets: 239 10*3/uL (ref 150–400)
RBC: 4.34 MIL/uL (ref 3.87–5.11)
RDW: 12.3 % (ref 11.5–15.5)
WBC: 6.5 10*3/uL (ref 4.0–10.5)
nRBC: 0 % (ref 0.0–0.2)

## 2019-10-05 LAB — FERRITIN: Ferritin: 82 ng/mL (ref 11–307)

## 2019-10-05 NOTE — Progress Notes (Signed)
[facility]  [address], [address] [place], [place] [address] Phone: [phone]  Fax: [phone]   Clinic Day:  10/06/2019  Referring physician: Juline Patch, MD  Chief Complaint: Ellen Baker is a 69 y.o. female with iron deficiency anemia and B12 deficiency who is seen for 8 month assessment.  HPI: The patient was last seen in the hematology clinic on 01/21/2019. At that time, she was doing well. Exam was unremarkable. CBC revealed a hematocrit of 41.4, hemoglobin 13.6, and MCV 93.2. Ferritin was 59 (goal 100).  She remained on oral iron and B12.   Bilateral mammogram on 02/24/2020 showed no evidence of malignancy.   Patient was last seen in the medical oncology clinic on 06/03/2019 by Faythe Casa, NP. At that time, she was doing well. Exam was unremarkable. Patient continued on oral B12. She was directed to reduce oral iron to BID given significant improvement in lab work.   CBC followed: 03/18/2019: Hematocrit 40.8, hemoglobin 13.6, MCV 93.2, platelets 238,000, WBC 6,100. Ferritin 67. 06/02/2019: Hematocrit 39.1, hemoglobin 13.1, MCV 93.5, platelets 232,000, WBC 6,400. Ferritin 82.  08/04/2019: Hematocrit 40.1, hemoglobin 13.3, MCV 93.5, platelets 262,000, WBC 6,000. Ferritin 69. 10/05/2019: Hematocrit 40.1, hemoglobin 13.4, MCV 92.4, platelets 239,000, WBC 6,500. Ferritin 82.   During the interim, the patient has felt "good". She was unable to tolerate oral iron TID. She had diarrhea. She has been taking oral iron BID and was able to tolerate it better. We discussed taking oral iron once daily; patient agreed. She has no bloody stools. She has some dark stools. She has been tolerating her medication by spacing them out with food in between. She continues to feel fatigued. She has a dog named Water quality scientist (havanese breed) and they go walking daily. She has right knee pain. She went to the dentist yesterday and had a good report. She is seen by her PCP  Dr. Otilio Miu every 6 months. She had her second COVID-19 vaccine on 09/01/2019. Patient wished to continue follow up care with me. Patients BP is 153/96 today.    Past Medical History:  Diagnosis Date  . Arthritis    right knee  . Carotid artery occlusion   . Dizziness   . GERD (gastroesophageal reflux disease)   . Heart murmur   . History of colon polyps   . Hyperlipidemia   . Hypertension   . Stroke [facility]) 12-31-12   no residual effects    Past Surgical History:  Procedure Laterality Date  . CAROTID ENDARTERECTOMY     RIGHT  01/19/13  . COLONOSCOPY  08-18-12  . COLONOSCOPY WITH PROPOFOL N/A 03/10/2018   Procedure: COLONOSCOPY WITH PROPOFOL;  Surgeon: Lollie Sails, MD;  Location: [facility] ENDOSCOPY;  Service: Endoscopy;  Laterality: N/A;  . DILATION AND CURETTAGE OF UTERUS    . ENDARTERECTOMY Right 01/19/2013   Procedure: ENDARTERECTOMY CAROTID with patch angioplasty;  Surgeon: Conrad [place], MD;  Location: [place];  Service: Vascular;  Laterality: Right;  . ESOPHAGEAL DILATION    . JOINT REPLACEMENT Left 04/15/2017  . TUBAL LIGATION    . UPPER GI ENDOSCOPY  08-18-12    Family History  Problem Relation Age of Onset  . Stroke Mother   . Deep vein thrombosis Mother   . Heart disease Mother        Amputation-  . Hypertension Mother   . Alzheimer's disease Mother   . Heart failure Father   . Heart disease Father  CHF  . Hypertension Father   . Varicose Veins Father   . Parkinson's disease Father   . Breast cancer Paternal Aunt 88    Social History:  reports that she has never smoked. She has never used smokeless tobacco. She reports that she does not drink alcohol or use drugs.the patient recently retired back in 05/2019. 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 She was recently furloughed for a short period from her job due to the pandemic. She lives in [place]. Her daughter's name is Amy. She has a dog named Water quality scientist (havanese breed). She is alone today.   Allergies:    Allergies  Allergen Reactions  . Adhesive [Tape] Rash    Skin became RAW    Current Medications: Current Outpatient Medications  Medication Sig Dispense Refill  . acetaminophen (TYLENOL) 650 MG CR tablet Take 650 mg by mouth every 8 (eight) hours as needed for pain.    . Biotin w/ Vitamins C & E (HAIR SKIN & NAILS GUMMIES PO) Take 2 each by mouth daily.    . Calcium Carb-Ergocalciferol (CHEWABLE CALCIUM/D PO) Take 2 tablets by mouth daily.     . clopidogrel (PLAVIX) 75 MG tablet TAKE 1 TABLET BY MOUTH DAILY WITH BREAKFAST 90 tablet 1  . Iron-Vitamin C 65-125 MG TABS Take 1 tablet by mouth 2 (two) times daily. (Patient taking differently: Take 1 tablet by mouth daily. ) 60 tablet 1  . loratadine (CLARITIN) 10 MG tablet Take 1 tablet (10 mg total) by mouth continuous as needed for allergies. 30 tablet 6  . losartan (COZAAR) 50 MG tablet TAKE 1 TABLET BY MOUTH DAILY 90 tablet 1  . meclizine (ANTIVERT) 25 MG tablet Take 1 tablet (25 mg total) by mouth 3 (three) times daily as needed for dizziness. 30 tablet 0  . Multiple Vitamins-Minerals (MULTIVITAMIN GUMMIES WOMENS) CHEW Chew 1 each by mouth daily.    Marland Kitchen omeprazole (PRILOSEC) 10 MG capsule Take 1 capsule (10 mg total) by mouth daily. Patient is not sure of doseage- 20mg  qday otc 30 capsule 11  . simvastatin (ZOCOR) 20 MG tablet Take 1 tablet (20 mg total) by mouth daily. 90 tablet 1  . vitamin B-12 (CYANOCOBALAMIN) 1000 MCG tablet Take 1,000 mcg by mouth daily.    . Naproxen Sodium (ALEVE PO) Take by mouth continuous as needed.     No current facility-administered medications for this visit.    Review of Systems  Constitutional: Positive for malaise/fatigue and weight loss (7 lbs). Negative for chills, diaphoresis and fever.       Feels "good". Active.  HENT: Negative.  Negative for congestion, ear discharge, ear pain, nosebleeds, sinus pain and sore throat.   Eyes: Negative.  Negative for blurred vision, double vision, photophobia and  pain.  Respiratory: Negative.  Negative for cough, hemoptysis, sputum production and shortness of breath.   Cardiovascular: Negative.  Negative for chest pain, palpitations, orthopnea, leg swelling and PND.  Gastrointestinal: Positive for melena (mild; on oral iron). Negative for abdominal pain, blood in stool, constipation, diarrhea, heartburn, nausea and vomiting.  Genitourinary: Negative.  Negative for dysuria, frequency, hematuria and urgency.  Musculoskeletal: Positive for joint pain (RIGHT knee). Negative for back pain, falls, myalgias and neck pain.  Skin: Negative.  Negative for itching and rash.  Neurological: Negative for dizziness, tremors, speech change, focal weakness, weakness and headaches.       History of a CVA in 2014.  Endo/Heme/Allergies: Does not bruise/bleed easily.  Psychiatric/Behavioral: Negative for depression and  memory loss. The patient is not nervous/anxious and does not have insomnia.   All other systems reviewed and are negative.  Performance status (ECOG): 1  Vitals Blood pressure (!) 153/96, pulse 85, temperature 98.2 F (36.8 C), temperature source Tympanic, resp. rate 18, height 5\' 3"  (1.6 m), weight 222 lb 10.6 oz (101 kg), SpO2 97 %.   Physical Exam  Constitutional: She is oriented to person, place, and time. She appears well-developed and well-nourished. No distress.  HENT:  Head: Normocephalic and atraumatic.  Mouth/Throat: Oropharynx is clear and moist. No oropharyngeal exudate.  Short brown hair. Mask.  Eyes: Pupils are equal, round, and reactive to light. Conjunctivae and EOM are normal. No scleral icterus.  Blue eyes. Glasses.  Neck: No JVD present.  Cardiovascular: Normal rate, regular rhythm and normal heart sounds.  No murmur heard. Pulmonary/Chest: Effort normal and breath sounds normal. No respiratory distress. She has no wheezes.  S/p carotid endarterectomy  Abdominal: Soft. Bowel sounds are normal. She exhibits no distension. There is  no abdominal tenderness.  Musculoskeletal:        General: Tenderness (BLE (R>L)) present. No edema. Normal range of motion.     Cervical back: Normal range of motion and neck supple.  Lymphadenopathy:    She has no cervical adenopathy.    She has no axillary adenopathy.       Right: No supraclavicular adenopathy present.       Left: No supraclavicular adenopathy present.  Neurological: She is alert and oriented to person, place, and time.  Skin: Skin is warm and dry. She is not diaphoretic. No erythema.  Psychiatric: She has a normal mood and affect. Her behavior is normal. Judgment and thought content normal.  Nursing note and vitals reviewed.   Appointment on 10/05/2019  Component Date Value Ref Range Status  . Ferritin 10/05/2019 82  11 - 307 ng/mL Final   Performed at [facility], [address] [place], [place] [address]  . WBC 10/05/2019 6.5  4.0 - 10.5 K/uL Final  . RBC 10/05/2019 4.34  3.87 - 5.11 MIL/uL Final  . Hemoglobin 10/05/2019 13.4  12.0 - 15.0 g/dL Final  . HCT 10/05/2019 40.1  36.0 - 46.0 % Final  . MCV 10/05/2019 92.4  80.0 - 100.0 fL Final  . MCH 10/05/2019 30.9  26.0 - 34.0 pg Final  . MCHC 10/05/2019 33.4  30.0 - 36.0 g/dL Final  . RDW 10/05/2019 12.3  11.5 - 15.5 % Final  . Platelets 10/05/2019 239  150 - 400 K/uL Final  . nRBC 10/05/2019 0.0  0.0 - 0.2 % Final  . Neutrophils Relative % 10/05/2019 66  % Final  . Neutro Abs 10/05/2019 4.4  1.7 - 7.7 K/uL Final  . Lymphocytes Relative 10/05/2019 23  % Final  . Lymphs Abs 10/05/2019 1.5  0.7 - 4.0 K/uL Final  . Monocytes Relative 10/05/2019 6  % Final  . Monocytes Absolute 10/05/2019 0.4  0.1 - 1.0 K/uL Final  . Eosinophils Relative 10/05/2019 3  % Final  . Eosinophils Absolute 10/05/2019 0.2  0.0 - 0.5 K/uL Final  . Basophils Relative 10/05/2019 1  % Final  . Basophils Absolute 10/05/2019 0.0  0.0 - 0.1 K/uL Final  . Immature Granulocytes 10/05/2019 1  % Final  . Abs Immature Granulocytes  10/05/2019 0.03  0.00 - 0.07 K/uL Final   Performed at [facility], [address] [place], [place] [address]    Assessment:  [place]  is a 69 y.o. female with iron deficiency. Diet appears good. She denies any melena, hematochezia, hematuria or vaginal bleeding. She is on Plavix.  Work-up on 07/21/2018 revealed a hematocrit of 35.4, hemoglobin 10.9, MCV 81.6, platelets 331,000, WBC 6300 with an ANC of 3900.  Ferritin was 8 with an iron saturation of 4% and a TIBC of 529.  B12 was 255 (low normal).  Retic was 1.1%.  Normal studies included folate, TSH, and urinalysis (no hematuria).  She is on Vitron-C.  Ferritin has been followed: 14 on 06/06/2018, 17 on 06/27/2018, 8 on 07/21/2018, 16 on 08/25/2018, 23 on 09/22/2018, 42 on 11/26/2018, 59 on 01/20/2019, 67 on 03/18/2019, 82 on 06/02/2019, 69 on 08/04/2019, and 82 on 10/05/2019.  EGD on 08/18/2012 revealed reflux esophagitis.  Esophageal biopsy at 32 cm revealed columnar mucosa with acute and chronic inflammation.  Esophageal biopsy at 30 cm revealed squamous mucosa with reflux changes and mild chronic inflammation.  There was no dysplasia or malignancy.  Colonoscopy on 03/10/2018 revealed diverticulosis in the sigmoid colon, in the descending colon, in the transverse colon and in the ascending colon.  There was one 3 mm polyp at the hepatic flexure (tubular adenoma) and two 4 to 6 mm polyps (hyperplastic) in the sigmoid colon.  She has B12 deficiency.  She started oral B12 on 07/22/2018.  B12 was 255 on 02/03/20200 and 476 on 08/25/2018.  Folate was 35 on 07/21/2018.  She had her second COVID-19 vaccine on 09/01/2019.  Symptomatically, she feels good.  She is taking oral iron twice daily.  Stool is dark.  She denies any bleeding.  Exam is stable.  Plan: 1.   Review labs from 10/05/2019. 2.   Iron deficiency anemia Hematocrit 40.1.  Hemoglobin 13.4.  MCV 92.6. Ferritin 82. Iron stores are good on oral iron.    Ferritin goal 100.  Discuss future plan to discontinue oral iron and follow-up labs to ensure counts stable. 3.   B12 deficiency B12 was 476 on 08/25/2018. Patient remains on oral B12. Check folate annually. 4.   RTC in 3 months for labs (CBC and ferritin). 5.   RTC in 6 months for MD assessment and labs (CBC with diff, ferritin- day before).  I discussed the assessment and treatment plan with the patient.  The patient was provided an opportunity to ask questions and all were answered.  The patient agreed with the plan and demonstrated an understanding of the instructions.  The patient was advised to call back if the symptoms worsen or if the condition fails to improve as anticipated.  I provided 25 minutes of face-to-face time during this this encounter and > 50% was spent counseling as documented under my assessment and plan.    Lequita Asal, MD, PhD    10/06/2019, 11:47 AM  I, Selena Batten, am acting as scribe for [employer]. Mike Gip, MD, PhD.  I, Nnaemeka Samson C. Mike Gip, MD, have reviewed the above documentation for accuracy and completeness, and I agree with the above.

## 2019-10-06 ENCOUNTER — Inpatient Hospital Stay: Payer: PPO | Admitting: Hematology and Oncology

## 2019-10-06 ENCOUNTER — Encounter: Payer: Self-pay | Admitting: Hematology and Oncology

## 2019-10-06 VITALS — BP 153/96 | HR 85 | Temp 98.2°F | Resp 18 | Ht 63.0 in | Wt 222.7 lb

## 2019-10-06 DIAGNOSIS — E538 Deficiency of other specified B group vitamins: Secondary | ICD-10-CM

## 2019-10-06 DIAGNOSIS — D509 Iron deficiency anemia, unspecified: Secondary | ICD-10-CM | POA: Diagnosis not present

## 2019-10-06 DIAGNOSIS — D649 Anemia, unspecified: Secondary | ICD-10-CM

## 2019-10-06 NOTE — Progress Notes (Signed)
No new changes noted today 

## 2019-11-13 DIAGNOSIS — M1711 Unilateral primary osteoarthritis, right knee: Secondary | ICD-10-CM | POA: Diagnosis not present

## 2019-11-13 DIAGNOSIS — M25561 Pain in right knee: Secondary | ICD-10-CM | POA: Diagnosis not present

## 2019-11-23 ENCOUNTER — Other Ambulatory Visit: Payer: Self-pay | Admitting: Surgery

## 2019-11-25 ENCOUNTER — Other Ambulatory Visit: Payer: Self-pay

## 2019-11-25 ENCOUNTER — Encounter
Admission: RE | Admit: 2019-11-25 | Discharge: 2019-11-25 | Disposition: A | Discharge status: Home / Self Care | Payer: PPO | Source: Ambulatory Visit | Attending: Surgery | Admitting: Surgery

## 2019-11-25 DIAGNOSIS — Z01818 Encounter for other preprocedural examination: Secondary | ICD-10-CM | POA: Insufficient documentation

## 2019-11-25 DIAGNOSIS — I10 Essential (primary) hypertension: Secondary | ICD-10-CM | POA: Insufficient documentation

## 2019-11-25 NOTE — Patient Instructions (Signed)
Your procedure is scheduled on: Tuesday 12/01/19.  Report to DAY SURGERY DEPARTMENT LOCATED ON 2ND FLOOR MEDICAL MALL ENTRANCE. To find out your arrival time please call [phone] between 1PM - 3PM on Monday 11/30/19.   Remember: Instructions that are not followed completely may result in serious medical risk, up to and including death, or upon the discretion of your surgeon and anesthesiologist your surgery may need to be rescheduled.      _X__ 1. Do not eat food after midnight the night before your procedure.                 No gum chewing or hard candies. You may drink clear liquids up to 2 hours                 before you are scheduled to arrive for your surgery- DO NOT drink clear                 liquids within 2 hours of the start of your surgery.                 Clear Liquids include:  water, apple juice without pulp, clear carbohydrate                 drink such as Clearfast or Gatorade, Black Coffee or Tea (Do not add                 anything to coffee or tea).    __X__2.  On the morning of surgery brush your teeth with toothpaste and water, you may rinse your mouth with mouthwash if you wish.  Do not swallow any toothpaste or mouthwash.       _X__ 3.  No Alcohol for 24 hours before or after surgery.     _X__ 4.  Do Not Smoke or use e-cigarettes For 24 Hours Prior to Your Surgery.                 Do not use any chewable tobacco products for at least 6 hours prior to                 surgery.    __X__5.  Notify your doctor if there is any change in your medical condition      (cold, fever, infections).       Do not wear jewelry, make-up, hairpins, clips or nail polish. Do not wear lotions, powders, or perfumes.  Do not shave 48 hours prior to surgery. Men may shave face and neck. Do not bring valuables to the hospital.     [facility] is not responsible for any belongings or valuables.   Contacts, dentures/partials or body piercings may not be worn into  surgery. Bring a case for your contacts, glasses or hearing aids, a denture cup will be supplied.    Patients discharged the day of surgery will not be allowed to drive home.    __X__ Take these medicines the morning of surgery with A SIP OF WATER:     1. omeprazole (PRILOSEC) 20 MG capsule  2. meclizine (ANTIVERT) if needed  3. loratadine (CLARITIN)  If needed    __X__ Use CHG Soap as directed    __X__ Stop Blood Thinners: Plavix 5 days prior to surgery per Dr. Otilio Miu instructions. Your last dose will be on Wednesday 11/25/19.    __X__ Stop Anti-inflammatories 7 days before surgery such as Advil, Ibuprofen, Motrin, BC or Goodies Powder, Naprosyn, Naproxen, Aleve,  Aspirin, Meloxicam. May take Tylenol if needed for pain or discomfort.    __X__ Don't start taking any new herbal supplements or vitamins prior to your procedure.

## 2019-11-27 ENCOUNTER — Other Ambulatory Visit
Admission: RE | Admit: 2019-11-27 | Discharge: 2019-11-27 | Disposition: A | Discharge status: Home / Self Care | Payer: PPO | Source: Ambulatory Visit | Attending: Surgery | Admitting: Surgery

## 2019-11-27 ENCOUNTER — Other Ambulatory Visit: Payer: Self-pay

## 2019-11-27 DIAGNOSIS — Z20822 Contact with and (suspected) exposure to covid-19: Secondary | ICD-10-CM | POA: Insufficient documentation

## 2019-11-27 DIAGNOSIS — I10 Essential (primary) hypertension: Secondary | ICD-10-CM | POA: Diagnosis not present

## 2019-11-27 DIAGNOSIS — Z01818 Encounter for other preprocedural examination: Secondary | ICD-10-CM | POA: Insufficient documentation

## 2019-11-27 LAB — CBC WITH DIFFERENTIAL/PLATELET
Abs Immature Granulocytes: 0.01 10*3/uL (ref 0.00–0.07)
Basophils Absolute: 0.1 10*3/uL (ref 0.0–0.1)
Basophils Relative: 1 %
Eosinophils Absolute: 0.2 10*3/uL (ref 0.0–0.5)
Eosinophils Relative: 4 %
HCT: 37.6 % (ref 36.0–46.0)
Hemoglobin: 12.5 g/dL (ref 12.0–15.0)
Immature Granulocytes: 0 %
Lymphocytes Relative: 26 %
Lymphs Abs: 1.5 10*3/uL (ref 0.7–4.0)
MCH: 30.9 pg (ref 26.0–34.0)
MCHC: 33.2 g/dL (ref 30.0–36.0)
MCV: 93.1 fL (ref 80.0–100.0)
Monocytes Absolute: 0.5 10*3/uL (ref 0.1–1.0)
Monocytes Relative: 8 %
Neutro Abs: 3.5 10*3/uL (ref 1.7–7.7)
Neutrophils Relative %: 61 %
Platelets: 274 10*3/uL (ref 150–400)
RBC: 4.04 MIL/uL (ref 3.87–5.11)
RDW: 12.7 % (ref 11.5–15.5)
WBC: 5.7 10*3/uL (ref 4.0–10.5)
nRBC: 0 % (ref 0.0–0.2)

## 2019-11-27 LAB — COMPREHENSIVE METABOLIC PANEL
ALT: 16 U/L (ref 0–44)
AST: 22 U/L (ref 15–41)
Albumin: 3.9 g/dL (ref 3.5–5.0)
Alkaline Phosphatase: 67 U/L (ref 38–126)
Anion gap: 9 (ref 5–15)
BUN: 10 mg/dL (ref 8–23)
CO2: 29 mmol/L (ref 22–32)
Calcium: 9.1 mg/dL (ref 8.9–10.3)
Chloride: 103 mmol/L (ref 98–111)
Creatinine, Ser: 0.69 mg/dL (ref 0.44–1.00)
GFR calc Af Amer: >60 mL/min (ref >60)
GFR calc non Af Amer: >60 mL/min (ref >60)
Glucose, Bld: 103 mg/dL — ABNORMAL HIGH (ref 70–99)
Potassium: 3.6 mmol/L (ref 3.5–5.1)
Sodium: 141 mmol/L (ref 135–145)
Total Bilirubin: 1.2 mg/dL (ref 0.3–1.2)
Total Protein: 7.3 g/dL (ref 6.5–8.1)

## 2019-11-27 LAB — SURGICAL PCR SCREEN
MRSA, PCR: NEGATIVE
Staphylococcus aureus: NEGATIVE

## 2019-11-27 LAB — SARS CORONAVIRUS 2 (TAT 6-24 HRS): SARS Coronavirus 2: NEGATIVE

## 2019-11-29 LAB — TYPE AND SCREEN
ABO/RH(D): A POS
Antibody Screen: NEGATIVE

## 2019-12-01 ENCOUNTER — Inpatient Hospital Stay: Payer: PPO

## 2019-12-01 ENCOUNTER — Encounter: Payer: Self-pay | Admitting: Surgery

## 2019-12-01 ENCOUNTER — Inpatient Hospital Stay: Payer: PPO | Admitting: Anesthesiology

## 2019-12-01 ENCOUNTER — Encounter
Admission: RE | Disposition: A | Discharge status: Home Health | Payer: Self-pay | Source: Home / Self Care | Attending: Surgery

## 2019-12-01 ENCOUNTER — Inpatient Hospital Stay
Admission: RE | Admit: 2019-12-01 | Discharge: 2019-12-03 | DRG: 470 | Disposition: A | Discharge status: Home Health | Payer: PPO | Attending: Surgery | Admitting: Surgery

## 2019-12-01 ENCOUNTER — Other Ambulatory Visit: Payer: Self-pay

## 2019-12-01 DIAGNOSIS — M25561 Pain in right knee: Secondary | ICD-10-CM | POA: Diagnosis not present

## 2019-12-01 DIAGNOSIS — Z6838 Body mass index (BMI) 38.0-38.9, adult: Secondary | ICD-10-CM

## 2019-12-01 DIAGNOSIS — Z820 Family history of epilepsy and other diseases of the nervous system: Secondary | ICD-10-CM | POA: Diagnosis not present

## 2019-12-01 DIAGNOSIS — Z8673 Personal history of transient ischemic attack (TIA), and cerebral infarction without residual deficits: Secondary | ICD-10-CM | POA: Diagnosis not present

## 2019-12-01 DIAGNOSIS — K219 Gastro-esophageal reflux disease without esophagitis: Secondary | ICD-10-CM | POA: Diagnosis present

## 2019-12-01 DIAGNOSIS — I10 Essential (primary) hypertension: Secondary | ICD-10-CM | POA: Diagnosis present

## 2019-12-01 DIAGNOSIS — Z7902 Long term (current) use of antithrombotics/antiplatelets: Secondary | ICD-10-CM | POA: Diagnosis not present

## 2019-12-01 DIAGNOSIS — M1711 Unilateral primary osteoarthritis, right knee: Secondary | ICD-10-CM | POA: Diagnosis not present

## 2019-12-01 DIAGNOSIS — Z8719 Personal history of other diseases of the digestive system: Secondary | ICD-10-CM

## 2019-12-01 DIAGNOSIS — E785 Hyperlipidemia, unspecified: Secondary | ICD-10-CM | POA: Diagnosis not present

## 2019-12-01 DIAGNOSIS — Z96651 Presence of right artificial knee joint: Secondary | ICD-10-CM

## 2019-12-01 DIAGNOSIS — Z79899 Other long term (current) drug therapy: Secondary | ICD-10-CM

## 2019-12-01 DIAGNOSIS — Z96652 Presence of left artificial knee joint: Secondary | ICD-10-CM | POA: Diagnosis not present

## 2019-12-01 DIAGNOSIS — Z471 Aftercare following joint replacement surgery: Secondary | ICD-10-CM | POA: Diagnosis not present

## 2019-12-01 HISTORY — PX: TOTAL KNEE ARTHROPLASTY: SHX125

## 2019-12-01 LAB — ABO/RH: ABO/RH(D): A POS

## 2019-12-01 SURGERY — ARTHROPLASTY, KNEE, TOTAL
Anesthesia: Spinal | Site: Knee | Laterality: Right

## 2019-12-01 MED ORDER — CEFAZOLIN SODIUM-DEXTROSE 2-4 GM/100ML-% IV SOLN
2.0000 g | INTRAVENOUS | Status: AC
  Administered 2019-12-01: 2 g via INTRAVENOUS

## 2019-12-01 MED ORDER — EPINEPHRINE PF 1 MG/ML IJ SOLN
INTRAMUSCULAR | Status: AC
  Filled 2019-12-01: qty 1

## 2019-12-01 MED ORDER — CHLORHEXIDINE GLUCONATE 0.12 % MT SOLN
OROMUCOSAL | Status: AC
  Filled 2019-12-01: qty 15

## 2019-12-01 MED ORDER — ACETAMINOPHEN 325 MG PO TABS: 325.0000 mg | ORAL_TABLET | Freq: Four times a day (QID) | ORAL | Status: DC | PRN

## 2019-12-01 MED ORDER — PHENYLEPHRINE HCL (PRESSORS) 10 MG/ML IV SOLN
INTRAVENOUS | Status: DC | PRN
  Administered 2019-12-01: 50 ug via INTRAVENOUS
  Administered 2019-12-01 (×2): 100 ug via INTRAVENOUS

## 2019-12-01 MED ORDER — MAGNESIUM HYDROXIDE 400 MG/5ML PO SUSP
30.0000 mL | Freq: Every day | ORAL | Status: DC | PRN
  Administered 2019-12-02 – 2019-12-03 (×2): 30 mL via ORAL
  Filled 2019-12-01 (×2): qty 30

## 2019-12-01 MED ORDER — SIMVASTATIN 20 MG PO TABS
20.0000 mg | ORAL_TABLET | Freq: Every day | ORAL | Status: DC
  Administered 2019-12-02 – 2019-12-03 (×2): 20 mg via ORAL
  Filled 2019-12-01 (×2): qty 1

## 2019-12-01 MED ORDER — KETOROLAC TROMETHAMINE 15 MG/ML IJ SOLN: 15.0000 mg | Freq: Once | INTRAMUSCULAR | Status: AC

## 2019-12-01 MED ORDER — ACETAMINOPHEN 10 MG/ML IV SOLN
INTRAVENOUS | Status: DC | PRN
  Administered 2019-12-01: 1000 mg via INTRAVENOUS

## 2019-12-01 MED ORDER — CEFAZOLIN SODIUM-DEXTROSE 2-4 GM/100ML-% IV SOLN
2.0000 g | Freq: Four times a day (QID) | INTRAVENOUS | Status: AC
  Administered 2019-12-01 – 2019-12-02 (×3): 2 g via INTRAVENOUS
  Filled 2019-12-01 (×3): qty 100

## 2019-12-01 MED ORDER — SODIUM CHLORIDE FLUSH 0.9 % IV SOLN
INTRAVENOUS | Status: AC
  Filled 2019-12-01: qty 20

## 2019-12-01 MED ORDER — ONDANSETRON HCL 4 MG/2ML IJ SOLN
4.0000 mg | Freq: Four times a day (QID) | INTRAMUSCULAR | Status: DC | PRN
  Administered 2019-12-01: 4 mg via INTRAVENOUS
  Filled 2019-12-01: qty 2

## 2019-12-01 MED ORDER — CEFAZOLIN SODIUM-DEXTROSE 2-4 GM/100ML-% IV SOLN
INTRAVENOUS | Status: AC
  Filled 2019-12-01: qty 100

## 2019-12-01 MED ORDER — PROPOFOL 500 MG/50ML IV EMUL
INTRAVENOUS | Status: AC
  Filled 2019-12-01: qty 50

## 2019-12-01 MED ORDER — GLYCOPYRROLATE 0.2 MG/ML IJ SOLN
INTRAMUSCULAR | Status: AC
  Filled 2019-12-01: qty 1

## 2019-12-01 MED ORDER — CHLORHEXIDINE GLUCONATE 0.12 % MT SOLN
15.0000 mL | Freq: Once | OROMUCOSAL | Status: AC
  Administered 2019-12-01: 15 mL via OROMUCOSAL

## 2019-12-01 MED ORDER — LORATADINE 10 MG PO TABS: 10.0000 mg | ORAL_TABLET | ORAL | Status: DC | PRN

## 2019-12-01 MED ORDER — SODIUM CHLORIDE 0.9 % IV SOLN
INTRAVENOUS | Status: DC | PRN
  Administered 2019-12-01: 60 mL

## 2019-12-01 MED ORDER — GLYCOPYRROLATE 0.2 MG/ML IJ SOLN
INTRAMUSCULAR | Status: DC | PRN
Start: 2019-12-01 — End: 2019-12-01
  Administered 2019-12-01: .2 mg via INTRAVENOUS

## 2019-12-01 MED ORDER — KETOROLAC TROMETHAMINE 15 MG/ML IJ SOLN
INTRAMUSCULAR | Status: AC
  Administered 2019-12-01: 15 mg via INTRAVENOUS
  Filled 2019-12-01: qty 1

## 2019-12-01 MED ORDER — SODIUM CHLORIDE 0.9 % IV SOLN
INTRAVENOUS | Status: DC | PRN
  Administered 2019-12-01: 25 ug/min via INTRAVENOUS

## 2019-12-01 MED ORDER — LACTATED RINGERS IV SOLN
INTRAVENOUS | Status: DC
  Administered 2019-12-01: 1000 mL via INTRAVENOUS

## 2019-12-01 MED ORDER — OXYCODONE HCL 5 MG PO TABS
5.0000 mg | ORAL_TABLET | ORAL | Status: DC | PRN
  Administered 2019-12-01: 10 mg via ORAL
  Administered 2019-12-02 – 2019-12-03 (×4): 5 mg via ORAL
  Filled 2019-12-01: qty 1
  Filled 2019-12-01: qty 2
  Filled 2019-12-01 (×3): qty 1

## 2019-12-01 MED ORDER — HYDROMORPHONE HCL 1 MG/ML IJ SOLN: 0.2500 mg | INTRAMUSCULAR | Status: DC | PRN

## 2019-12-01 MED ORDER — ONDANSETRON HCL 4 MG PO TABS
4.0000 mg | ORAL_TABLET | Freq: Four times a day (QID) | ORAL | Status: DC | PRN
  Administered 2019-12-02: 4 mg via ORAL
  Filled 2019-12-01: qty 1

## 2019-12-01 MED ORDER — ACETAMINOPHEN 500 MG PO TABS
1000.0000 mg | ORAL_TABLET | Freq: Four times a day (QID) | ORAL | Status: AC
  Administered 2019-12-01 (×3): 1000 mg via ORAL
  Filled 2019-12-01 (×4): qty 2

## 2019-12-01 MED ORDER — PANTOPRAZOLE SODIUM 40 MG PO TBEC
40.0000 mg | DELAYED_RELEASE_TABLET | Freq: Every day | ORAL | Status: DC
  Administered 2019-12-02 – 2019-12-03 (×2): 40 mg via ORAL
  Filled 2019-12-01 (×2): qty 1

## 2019-12-01 MED ORDER — TRANEXAMIC ACID 1000 MG/10ML IV SOLN
INTRAVENOUS | Status: DC | PRN
  Administered 2019-12-01: 1000 mg via TOPICAL

## 2019-12-01 MED ORDER — ENOXAPARIN SODIUM 40 MG/0.4ML ~~LOC~~ SOLN
40.0000 mg | SUBCUTANEOUS | Status: DC
  Administered 2019-12-02 – 2019-12-03 (×2): 40 mg via SUBCUTANEOUS
  Filled 2019-12-01 (×2): qty 0.4

## 2019-12-01 MED ORDER — KETOROLAC TROMETHAMINE 15 MG/ML IJ SOLN
7.5000 mg | Freq: Four times a day (QID) | INTRAMUSCULAR | Status: AC
  Administered 2019-12-01 – 2019-12-02 (×4): 7.5 mg via INTRAVENOUS
  Filled 2019-12-01 (×4): qty 1

## 2019-12-01 MED ORDER — TRANEXAMIC ACID 1000 MG/10ML IV SOLN
INTRAVENOUS | Status: AC
  Filled 2019-12-01: qty 10

## 2019-12-01 MED ORDER — PROPOFOL 500 MG/50ML IV EMUL
INTRAVENOUS | Status: DC | PRN
  Administered 2019-12-01: 50 ug/kg/min via INTRAVENOUS

## 2019-12-01 MED ORDER — DEXMEDETOMIDINE HCL 200 MCG/2ML IV SOLN
INTRAVENOUS | Status: DC | PRN
  Administered 2019-12-01: 8 ug via INTRAVENOUS
  Administered 2019-12-01: 12 ug via INTRAVENOUS

## 2019-12-01 MED ORDER — VITAMIN B-12 1000 MCG PO TABS
1000.0000 ug | ORAL_TABLET | Freq: Every day | ORAL | Status: DC
  Administered 2019-12-02 – 2019-12-03 (×2): 1000 ug via ORAL
  Filled 2019-12-01 (×2): qty 1

## 2019-12-01 MED ORDER — ADULT MULTIVITAMIN W/MINERALS CH
1.0000 | ORAL_TABLET | Freq: Every day | ORAL | Status: DC
  Administered 2019-12-02 – 2019-12-03 (×2): 1 via ORAL
  Filled 2019-12-01 (×2): qty 1

## 2019-12-01 MED ORDER — METOCLOPRAMIDE HCL 10 MG PO TABS: 5.0000 mg | ORAL_TABLET | Freq: Three times a day (TID) | ORAL | Status: DC | PRN

## 2019-12-01 MED ORDER — ACETAMINOPHEN 10 MG/ML IV SOLN
INTRAVENOUS | Status: AC
  Filled 2019-12-01: qty 100

## 2019-12-01 MED ORDER — BUPIVACAINE-EPINEPHRINE (PF) 0.5% -1:200000 IJ SOLN
INTRAMUSCULAR | Status: DC | PRN
  Administered 2019-12-01: 30 mL via PERINEURAL

## 2019-12-01 MED ORDER — MECLIZINE HCL 25 MG PO TABS
25.0000 mg | ORAL_TABLET | Freq: Three times a day (TID) | ORAL | Status: DC | PRN
  Filled 2019-12-01: qty 1

## 2019-12-01 MED ORDER — DIPHENHYDRAMINE HCL 12.5 MG/5ML PO ELIX
12.5000 mg | ORAL_SOLUTION | ORAL | Status: DC | PRN
  Administered 2019-12-02: 25 mg via ORAL
  Filled 2019-12-01: qty 10

## 2019-12-01 MED ORDER — ORAL CARE MOUTH RINSE: 15.0000 mL | Freq: Once | OROMUCOSAL | Status: AC

## 2019-12-01 MED ORDER — PROPOFOL 10 MG/ML IV BOLUS
INTRAVENOUS | Status: DC | PRN
  Administered 2019-12-01: 30 mg via INTRAVENOUS

## 2019-12-01 MED ORDER — BUPIVACAINE LIPOSOME 1.3 % IJ SUSP
INTRAMUSCULAR | Status: AC
  Filled 2019-12-01: qty 20

## 2019-12-01 MED ORDER — PROPOFOL 10 MG/ML IV BOLUS
INTRAVENOUS | Status: AC
  Filled 2019-12-01: qty 20

## 2019-12-01 MED ORDER — METOCLOPRAMIDE HCL 5 MG/ML IJ SOLN: 5.0000 mg | Freq: Three times a day (TID) | INTRAMUSCULAR | Status: DC | PRN

## 2019-12-01 MED ORDER — LIDOCAINE HCL (CARDIAC) PF 100 MG/5ML IV SOSY
PREFILLED_SYRINGE | INTRAVENOUS | Status: DC | PRN
  Administered 2019-12-01 (×2): 50 mg via INTRAVENOUS

## 2019-12-01 MED ORDER — FLEET ENEMA 7-19 GM/118ML RE ENEM: 1.0000 | ENEMA | Freq: Once | RECTAL | Status: DC | PRN

## 2019-12-01 MED ORDER — SODIUM CHLORIDE 0.9 % IV SOLN: INTRAVENOUS | Status: DC

## 2019-12-01 MED ORDER — TRAMADOL HCL 50 MG PO TABS: 50.0000 mg | ORAL_TABLET | Freq: Four times a day (QID) | ORAL | Status: DC | PRN

## 2019-12-01 MED ORDER — LOSARTAN POTASSIUM 50 MG PO TABS
50.0000 mg | ORAL_TABLET | Freq: Every day | ORAL | Status: DC
  Filled 2019-12-01: qty 1

## 2019-12-01 MED ORDER — BISACODYL 10 MG RE SUPP: 10.0000 mg | Freq: Every day | RECTAL | Status: DC | PRN

## 2019-12-01 MED ORDER — BUPIVACAINE HCL (PF) 0.5 % IJ SOLN
INTRAMUSCULAR | Status: AC
  Filled 2019-12-01: qty 30

## 2019-12-01 MED ORDER — DOCUSATE SODIUM 100 MG PO CAPS
100.0000 mg | ORAL_CAPSULE | Freq: Two times a day (BID) | ORAL | Status: DC
  Administered 2019-12-01 – 2019-12-03 (×4): 100 mg via ORAL
  Filled 2019-12-01 (×4): qty 1

## 2019-12-01 SURGICAL SUPPLY — 63 items
BLADE SAW SAG 25X90X1.19 (BLADE) ×3 IMPLANT
BLADE SURG SZ20 CARB STEEL (BLADE) ×3 IMPLANT
BNDG ELASTIC 6X5.8 VLCR NS LF (GAUZE/BANDAGES/DRESSINGS) ×3 IMPLANT
CANISTER SUCT 1200ML W/VALVE (MISCELLANEOUS) ×3 IMPLANT
CANISTER SUCT 3000ML PPV (MISCELLANEOUS) ×3 IMPLANT
CEMENT BONE R 1X40 (Cement) ×6 IMPLANT
CEMENT VACUUM MIXING SYSTEM (MISCELLANEOUS) ×3 IMPLANT
CHLORAPREP W/TINT 26 (MISCELLANEOUS) ×6 IMPLANT
COOLER POLAR GLACIER W/PUMP (MISCELLANEOUS) ×3 IMPLANT
COVER MAYO STAND REUSABLE (DRAPES) ×3 IMPLANT
COVER WAND RF STERILE (DRAPES) ×3 IMPLANT
CUFF TOURN SGL QUICK 24 (TOURNIQUET CUFF)
CUFF TOURN SGL QUICK 30 (TOURNIQUET CUFF)
CUFF TOURN SGL QUICK 34 (TOURNIQUET CUFF) ×2
CUFF TRNQT CYL 24X4X16.5-23 (TOURNIQUET CUFF) IMPLANT
CUFF TRNQT CYL 30X4X21-28X (TOURNIQUET CUFF) IMPLANT
CUFF TRNQT CYL 34X4.125X (TOURNIQUET CUFF) ×1 IMPLANT
DRAPE 3/4 80X56 (DRAPES) ×3 IMPLANT
DRAPE IMP U-DRAPE 54X76 (DRAPES) ×3 IMPLANT
DRSG OPSITE POSTOP 4X10 (GAUZE/BANDAGES/DRESSINGS) IMPLANT
DRSG OPSITE POSTOP 4X12 (GAUZE/BANDAGES/DRESSINGS) ×3 IMPLANT
DRSG OPSITE POSTOP 4X8 (GAUZE/BANDAGES/DRESSINGS) IMPLANT
ELECT CAUTERY BLADE 6.4 (BLADE) ×3 IMPLANT
ELECT REM PT RETURN 9FT ADLT (ELECTROSURGICAL) ×3
ELECTRODE REM PT RTRN 9FT ADLT (ELECTROSURGICAL) ×1 IMPLANT
GLOVE BIO SURGEON STRL SZ7 (GLOVE) ×9 IMPLANT
GLOVE BIO SURGEON STRL SZ7.5 (GLOVE) ×12 IMPLANT
GLOVE BIO SURGEON STRL SZ8 (GLOVE) ×15 IMPLANT
GLOVE BIOGEL PI IND STRL 8 (GLOVE) ×1 IMPLANT
GLOVE BIOGEL PI INDICATOR 8 (GLOVE) ×2
GLOVE INDICATOR 8.0 STRL GRN (GLOVE) ×3 IMPLANT
GOWN STRL REUS W/ TWL LRG LVL3 (GOWN DISPOSABLE) ×2 IMPLANT
GOWN STRL REUS W/ TWL XL LVL3 (GOWN DISPOSABLE) ×2 IMPLANT
GOWN STRL REUS W/TWL LRG LVL3 (GOWN DISPOSABLE) ×4
GOWN STRL REUS W/TWL XL LVL3 (GOWN DISPOSABLE) ×4
HOLDER FOLEY CATH W/STRAP (MISCELLANEOUS) IMPLANT
HOOD PEEL AWAY FLYTE STAYCOOL (MISCELLANEOUS) ×12 IMPLANT
INSERT TIB BEARING 71X12 (Insert) ×3 IMPLANT
KIT TURNOVER KIT A (KITS) ×3 IMPLANT
KNEE CR FEMORAL RT 65MM (Femur) ×3 IMPLANT
NDL SAFETY ECLIPSE 18X1.5 (NEEDLE) ×2 IMPLANT
NEEDLE HYPO 18GX1.5 SHARP (NEEDLE) ×4
NEEDLE SPNL 20GX3.5 QUINCKE YW (NEEDLE) ×3 IMPLANT
NS IRRIG 1000ML POUR BTL (IV SOLUTION) ×3 IMPLANT
PACK TOTAL KNEE (MISCELLANEOUS) ×3 IMPLANT
PAD WRAPON POLAR KNEE (MISCELLANEOUS) ×1 IMPLANT
PATELLA STD 34X8.5 (Orthopedic Implant) ×3 IMPLANT
PLATE KNEE TIBIAL 71MM FIXED (Plate) ×3 IMPLANT
PULSAVAC PLUS IRRIG FAN TIP (DISPOSABLE) ×3
SOL .9 NS 3000ML IRR  AL (IV SOLUTION) ×2
SOL .9 NS 3000ML IRR UROMATIC (IV SOLUTION) ×1 IMPLANT
STAPLER SKIN PROX 35W (STAPLE) ×3 IMPLANT
SUCTION FRAZIER HANDLE 10FR (MISCELLANEOUS) ×2
SUCTION TUBE FRAZIER 10FR DISP (MISCELLANEOUS) ×1 IMPLANT
SUT VIC AB 0 CT1 36 (SUTURE) ×9 IMPLANT
SUT VIC AB 2-0 CT1 27 (SUTURE) ×6
SUT VIC AB 2-0 CT1 TAPERPNT 27 (SUTURE) ×3 IMPLANT
SYR 10ML LL (SYRINGE) ×3 IMPLANT
SYR 20ML LL LF (SYRINGE) ×3 IMPLANT
SYR 30ML LL (SYRINGE) ×9 IMPLANT
TIP FAN IRRIG PULSAVAC PLUS (DISPOSABLE) ×1 IMPLANT
TRAY FOLEY MTR SLVR 16FR STAT (SET/KITS/TRAYS/PACK) IMPLANT
WRAPON POLAR PAD KNEE (MISCELLANEOUS) ×3

## 2019-12-01 NOTE — Evaluation (Signed)
Physical Therapy Evaluation Patient Details Name: Ellen Baker MRN: [id number] DOB: [birth year] Today's Date: 12/01/2019   History of Present Illness  admitted for acute hospitalization s/p R TKR, WBAT (12/01/19)  Clinical Impression  Upon evaluation, patient alert and oriented; follows commands and agreeable to participation with session.  Rates R knee pain 6/10; meds received prior to session.  Demonstrates fair/good post-op strength (3-/5) and ROM (8-78 degrees) in R knee; fair/good R quad activation and control in both open and closed-chain positions.  Full sensation returned.  Able to complete bed mobility with min assist; sit/stand, basic transfers and gait (5' x1, 8' x1) with RW, cga/min assist.  Demonstrates step to gait pattern, decreased stance time R LE; mild R knee flexion in loading, but no buckling or LOB.  Patient declined additional gait efforts at this time; will continue to progress distance as appropriate. Would benefit from skilled PT to address above deficits and promote optimal return to PLOF.; Recommend transition to HHPT upon discharge from acute hospitalization.     Follow Up Recommendations Home health PT    Equipment Recommendations  Rolling walker with 5" wheels;3in1 (PT)    Recommendations for Other Services       Precautions / Restrictions Precautions Precautions: Fall Restrictions Weight Bearing Restrictions: Yes RLE Weight Bearing: Weight bearing as tolerated      Mobility  Bed Mobility Overal bed mobility: Needs Assistance Bed Mobility: Supine to Sit     Supine to sit: Min assist        Transfers Overall transfer level: Needs assistance Equipment used: Rolling walker (2 wheeled) Transfers: Sit to/from Stand Sit to Stand: Min assist         General transfer comment: cuing for hand placement  Ambulation/Gait Ambulation/Gait assistance: Min assist Gait Distance (Feet):  (5' x1, 8' x1) Assistive device: Rolling walker (2  wheeled) Gait Pattern/deviations: Step-to pattern     General Gait Details: step to gait pattern, leading with R LE; decreased stance time R LE, mild knee flexion in loading phase, but no overt buckling or LOB  Stairs            Wheelchair Mobility    Modified Rankin (Stroke Patients Only)       Balance Overall balance assessment: Needs assistance Sitting-balance support: No upper extremity supported;Feet supported Sitting balance-Leahy Scale: Good     Standing balance support: Bilateral upper extremity supported Standing balance-Leahy Scale: Fair                               Pertinent Vitals/Pain Pain Assessment: 0-10 Pain Score: 6  Pain Location: R knee Pain Descriptors / Indicators: Aching;Grimacing Pain Intervention(s): Limited activity within patient's tolerance;Monitored during session;Premedicated before session;Repositioned    Home Living Family/patient expects to be discharged to:: Private residence Living Arrangements: Alone Available Help at Discharge: Family;Available PRN/intermittently Type of Home: House Home Access: Stairs to enter Entrance Stairs-Rails: Right Entrance Stairs-Number of Steps: 2 Home Layout: One level Home Equipment: Cane - single point      Prior Function Level of Independence: Independent with assistive device(s)         Comments: Indep with ADLs, household and community mobiliization without assist device; intermittent use of SPC "when I felt like I needed it".  Denies fall history.     Hand Dominance        Extremity/Trunk Assessment   Upper Extremity Assessment Upper Extremity Assessment: Overall WFL for tasks assessed  Lower Extremity Assessment Lower Extremity Assessment:  (R knee grossly 3-/5, limited by pain and post-op dressing, full sensation returned; L LE grossly WFL)       Communication   Communication: No difficulties  Cognition Arousal/Alertness: Awake/alert Behavior During  Therapy: WFL for tasks assessed/performed Overall Cognitive Status: Within Functional Limits for tasks assessed                                        General Comments      Exercises Total Joint Exercises Goniometric ROM: R knee: 8-78 degrees Other Exercises Other Exercises: R LE ankle pumps, LAQs, 1x10, active ROM. Fair/good R quad activation and isolation. Other Exercises: Toilet transfer, SPT with RW, cga/min assist; sit/stand from [facility] with RW, cga/min assist   Assessment/Plan    PT Assessment Patient needs continued PT services  PT Problem List Decreased strength;Decreased range of motion;Decreased activity tolerance;Decreased balance;Decreased mobility;Decreased coordination;Decreased cognition;Decreased knowledge of use of DME;Decreased safety awareness;Decreased knowledge of precautions;Pain       PT Treatment Interventions DME instruction;Gait training;Stair training;Functional mobility training;Balance training;Therapeutic exercise;Therapeutic activities;Patient/family education    PT Goals (Current goals can be found in the Care Plan section)  Acute Rehab PT Goals Patient Stated Goal: to get better PT Goal Formulation: With patient Time For Goal Achievement: 12/15/19 Potential to Achieve Goals: Good    Frequency BID   Barriers to discharge        Co-evaluation               AM-PAC PT "6 Clicks" Mobility  Outcome Measure Help needed turning from your back to your side while in a flat bed without using bedrails?: A Little Help needed moving from lying on your back to sitting on the side of a flat bed without using bedrails?: A Little Help needed moving to and from a bed to a chair (including a wheelchair)?: A Little Help needed standing up from a chair using your arms (e.g., wheelchair or bedside chair)?: A Little Help needed to walk in hospital room?: A Little Help needed climbing 3-5 steps with a railing? : A Little 6 Click Score: 18     End of Session Equipment Utilized During Treatment: Gait belt Activity Tolerance: Patient tolerated treatment well Patient left: in chair;with call bell/phone within reach;with chair alarm set Nurse Communication: Mobility status PT Visit Diagnosis: Muscle weakness (generalized) (M62.81);Other abnormalities of gait and mobility (R26.89);Pain Pain - Right/Left: Right Pain - part of body: Knee    Time: 9030-0923 PT Time Calculation (min) (ACUTE ONLY): 28 min   Charges:   PT Evaluation $PT Eval Moderate Complexity: 1 Mod PT Treatments $Therapeutic Activity: 8-22 mins       Amadeo Coke H. Owens Shark, PT, DPT, NCS 12/01/19, 10:13 PM [phone]

## 2019-12-01 NOTE — Op Note (Signed)
12/01/2019  12:43 PM  Patient:   Ellen Baker  Pre-Op Diagnosis:   Degenerative joint disease, right knee.  Post-Op Diagnosis:   Same  Procedure:   Right TKA using all-cemented [employer] Vanguard system with a 65 mm PCR femur, a 71 mm tibial tray with a 12 mm anterior stabilized E-poly insert, and a 34 x 8.5 mm all-poly 3-pegged domed patella.  Surgeon:   Pascal Lux, MD  Assistant:   Arrie Aran, RNFA; Kirkland Hun, PA-S  Anesthesia:   Spinal  Findings:   As above  Complications:   None  EBL:   10 cc  Fluids:   1000 cc crystalloid  UOP:   None  TT:   100 minutes at 300 mmHg  Drains:   None  Closure:   Staples  Implants:   As above  Brief Clinical Note:   The patient is a 69 year old female with a long history of progressively worsening right knee pain. The patient's symptoms have progressed despite medications, activity modification, injections, etc. The patient's history and examination were consistent with advanced degenerative joint disease of the right knee confirmed by plain radiographs. The patient presents at this time for a right total knee arthroplasty.  Procedure:   The patient was brought into the operating room. After adequate spinal anesthesia was obtained, the patient was lain in the supine position. The right lower extremity was prepped with ChloraPrep solution and draped sterilely. Preoperative antibiotics were administered. After verifying the proper laterality with a surgical timeout, the limb was exsanguinated with an Esmarch and the tourniquet inflated to 300 mmHg. A standard anterior approach to the knee was made through an approximately 7 inch incision. The incision was carried down through the subcutaneous tissues to expose superficial retinaculum. This was split the length of the incision and the medial flap elevated sufficiently to expose the medial retinaculum. The medial retinaculum was incised, leaving a 3-4 mm cuff of tissue on the  patella. This was extended distally along the medial border of the patellar tendon and proximally through the medial third of the quadriceps tendon. A subtotal fat pad excision was performed before the soft tissues were elevated off the anteromedial and anterolateral aspects of the proximal tibia to the level of the collateral ligaments. The anterior portions of the medial and lateral menisci were removed, as was the anterior cruciate ligament. With the knee flexed to 90, the external tibial guide was positioned and the appropriate proximal tibial cut made. This piece was taken to the back table where it was measured and found to be optimally replicated by a 71 mm component.  Attention was directed to the distal femur. The intramedullary canal was accessed through a 3/8" drill hole. The intramedullary guide was inserted and positioned in order to obtain a neutral flexion gap. The intercondylar block was positioned with care taken to avoid notching the anterior cortex of the femur. The appropriate cut was made. Next, the distal cutting block was placed at 6 of valgus alignment. Using the 9 mm slot, the distal cut was made. The distal femur was measured and found to be optimally replicated by the 65 mm component. The 65 mm 4-in-1 cutting block was positioned and first the posterior, then the posterior chamfer, the anterior chamfer, and finally the anterior cuts were made. At this point, the posterior portions medial and lateral menisci were removed. A trial reduction was performed using the appropriate femoral and tibial components with first the 10 mm and then the 12  mm insert. The 12 mm insert demonstrated excellent stability to varus and valgus stressing both in flexion and extension while permitting full extension. Patella tracking was assessed and found to be excellent. Therefore, the tibial guide position was marked on the proximal tibia. The patella thickness was measured and found to be 18 mm. Therefore,  the appropriate cut was made. The patellar surface was measured and found to be optimally replicated by the 34 mm component. The three peg holes were drilled in place before the trial button was inserted. Patella tracking was assessed and found to be excellent, passing the "no thumb test". The lug holes were drilled into the distal femur before the trial component was removed, leaving only the tibial tray. The keel was then created using the appropriate tower, reamer, and punch.  The bony surfaces were prepared for cementing by irrigating them thoroughly with bacitracin saline solution via the jet lavage system. A bone plug was fashioned from some of the bone that had been removed previously and used to plug the distal femoral canal. In addition, 20 cc of Exparel diluted out to 60 cc with normal saline and 30 cc of 0.5% Sensorcaine were injected into the postero-medial and postero-lateral aspects of the knee, the medial and lateral gutter regions, and the peri-incisional tissues to help with postoperative analgesia. Meanwhile, the cement was being mixed on the back table. When it was ready, the tibial tray was cemented in first. The excess cement was removed using [employer] fast streamer. Next, the femoral component was impacted into place. Again, the excess cement was removed using [employer] fast streamer. The 12 mm trial insert was positioned and the knee brought into extension while the cement hardened. Finally, the patella was cemented into place and secured using the patellar clamp. Again, the excess cement was removed using [employer] fast streamer. Once the cement had hardened, the knee was placed through a range of motion with the findings as described above. Therefore, the trial insert was removed and, after verifying that no cement had been retained posteriorly, the permanent 12 mm anterior stabilized E-polyethylene insert was positioned and secured using the appropriate key locking mechanism. Again the knee was placed through a  range of motion with the findings as described above.  The wound was copiously irrigated with sterile saline solution using the jet lavage system before the quadriceps tendon and retinacular layer were reapproximated using #0 Vicryl interrupted sutures. The superficial retinacular layer also was closed using a running #0 Vicryl suture. A total of 10 cc of transexemic acid (TXA) was injected intra-articularly before the subcutaneous tissues were closed in several layers using 2-0 Vicryl interrupted sutures. The skin was closed using staples. A sterile honeycomb dressing was applied to the skin before the leg was wrapped with an Ace wrap to accommodate the Polar Care device. The patient was then awakened and returned to the recovery room in satisfactory condition after tolerating the procedure well.

## 2019-12-01 NOTE — Transfer of Care (Signed)
Immediate Anesthesia Transfer of Care Note  Patient: Ellen Baker  Procedure(s) Performed: TOTAL KNEE ARTHROPLASTY - RNFA (Right Knee)  Patient Location: PACU  Anesthesia Type:Spinal  Level of Consciousness: drowsy and patient cooperative  Airway & Oxygen Therapy: Patient Spontanous Breathing and Patient connected to nasal cannula oxygen  Post-op Assessment: Report given to RN and Post -op Vital signs reviewed and stable  Post vital signs: Reviewed and stable  Last Vitals:  Vitals Value Taken Time  BP 100/64 12/01/19 1245  Temp    Pulse 105 12/01/19 1249  Resp 23 12/01/19 1249  SpO2 96 % 12/01/19 1249  Vitals shown include unvalidated device data.  Last Pain:  Vitals:   12/01/19 1245  TempSrc:   PainSc: (P) 0-No pain         Complications: No complications documented.

## 2019-12-01 NOTE — Anesthesia Procedure Notes (Signed)
Spinal  Patient location during procedure: OR Start time: 12/01/2019 10:18 AM End time: 12/01/2019 10:20 AM Staffing Performed: anesthesiologist  Anesthesiologist: Tera Mater, MD Preanesthetic Checklist Completed: patient identified, IV checked, site marked, risks and benefits discussed, surgical consent, monitors and equipment checked, pre-op evaluation and timeout performed Spinal Block Patient position: sitting Prep: ChloraPrep Patient monitoring: heart rate, continuous pulse ox, blood pressure and cardiac monitor Approach: midline Location: L4-5 Injection technique: single-shot Needle Needle type: Whitacre and Introducer  Needle gauge: 24 G Needle length: 9 cm Additional Notes Negative paresthesia. Negative blood return. Positive free-flowing CSF. Expiration date of kit checked and confirmed. Patient tolerated procedure well, without complications.

## 2019-12-01 NOTE — H&P (Signed)
History of Present Illness: Ellen Baker is a 69 y.o.female who is being seen for a new patient visit for right knee pain. The patient has been followed by Dr. Leanor Kail before he retired last year. In fact, he performed a left total knee arthroplasty on her in the past. The symptoms began many years ago and have gradually progressed without any specific cause or injury. She reports no pain at rest, but does have discomfort with any standing or ambulation. She also describes a significant "stiffness" in the morning when she first gets up. She has difficulty walking for any length of time, and is unable to reciprocate steps easily. The pain is located along the lateral and medial aspect of the knee. The pain is described as aching, dull and stabbing. The symptoms are aggravated using stairs, at higher levels of activity, walking, standing, standing pivot and exercising. She also describes no mechanical symptoms. She has associated swelling and deformity. She has tried acetaminophen, over-the-counter medications, anti-inflammatories and steroid injections with temporary partial relief.  Current Outpatient Medications: . CALCIUM CARBONATE/VITAMIN D3 (CALCIUM 500 + D, D3, ORAL) Take by mouth.  . clopidogrel (PLAVIX) 75 mg tablet TAKE 1 TABLET(75 MG) BY MOUTH DAILY WITH BREAKFAST  . loratadine (CLARITIN) 10 mg tablet Take by mouth.  . losartan (COZAAR) 50 MG tablet TAKE 1 TABLET(50 MG) BY MOUTH DAILY  . meclizine (ANTIVERT) 25 mg tablet Take 1 tablet by mouth once daily as needed 0  . naproxen sodium 220 mg Cap Take by mouth.  Marland Kitchen omeprazole 20 mg TK 1 T PO QD 10  . simvastatin (ZOCOR) 20 MG tablet Take by mouth.  . traZODone (DESYREL) 50 MG tablet Take 1 tablet by mouth once daily as needed 11   No current Epic-ordered facility-administered medications on file.   Allergies:  . Adhesive Rash  Skin became RAW   Past Medical History:  . Cerebral artery occlusion with cerebral infarction (CMS-HCC)  12/31/2012  . Colon polyp  . CVA (cerebral vascular accident) (CMS-HCC) 2014  . Dizziness  . Esophageal reflux 12/31/2012  . Essential hypertension 12/31/2012  . GERD (gastroesophageal reflux disease)  . Hyperlipidemia  . Occlusion and stenosis of unspecified carotid artery 01/02/2013  . Osteoarthritis  . Stroke (CMS-HCC)   Past Surgical History:  . CAROTID ENDARTERECTOMY  . COLONOSCOPY 08/18/2012  Dr. Kurtis Bushman @ [place] - Tubular Adenoma, rpt 5 yrs per [facility]  . COLONOSCOPY 03/10/2018  Tubular adenoma/Hyperplastic colon polyp/Repeat 40yrs/[facility]  . DILATION & CURRETTAGE  . JOINT REPLACEMENT Left 04/15/2017  total mako  . TUBAL LIGATION  . tubaligation  . UPPER GASTROINTESTINAL ENDOSCOPY   Family History  Problem Relation Age of Onset  . Alzheimer's disease Mother  . Ulcers Father  . Colon cancer Neg Hx  . Colon polyps Neg Hx  . Liver disease Neg Hx  . Rectal cancer Neg Hx   Social History:   Socioeconomic History:  Marland Kitchen Marital status: Widowed  Spouse name: Not on file  . Number of children: Not on file  . Years of education: Not on file  . Highest education level: Not on file  Occupational History:  . Not on file  Tobacco Use:  . Smoking status: Never Smoker  . Smokeless tobacco: Never Used  Substance and Sexual Activity:  . Alcohol use: No  . Drug use: No  . Sexual activity: Defer  Other Topics Concern  . Not on file  Social History Narrative  . Not on file   Social Determinants  of Health:   Financial Resource Strain:  . Difficulty of Paying Living Expenses:  Food Insecurity:  . Worried About Charity fundraiser in the Last Year:  . Arboriculturist in the Last Year:  Transportation Needs:  . Film/video editor (Medical):  Marland Kitchen Lack of Transportation (Non-Medical):   Review of Systems:  A comprehensive 14 point ROS was performed, reviewed, and the pertinent orthopaedic findings are documented in the HPI.  Physical Exam: Vitals:  11/13/19 0830  BP: 132/80   Weight: 100.2 kg (220 lb 12.8 oz)  Height: 161.3 cm (5' 3.5")  PainSc: 0-No pain  PainLoc: Knee   General/Constitutional: Pleasant overweight middle-aged female in no acute distress. Neuro/Psych: Normal mood and affect, oriented to person, place and time. Eyes: Non-icteric. Pupils are equal, round, and reactive to light, and exhibit synchronous movement. Lymphatic: No palpable adenopathy. Respiratory: Lungs clear to auscultation, Normal chest excursion, No wheezes and Non-labored breathing Cardiovascular: Regular rate and rhythm. No murmurs. and No edema, swelling or tenderness, except as noted in detailed exam. Vascular: No edema, swelling or tenderness, except as noted in detailed exam. Integumentary: No impressive skin lesions present, except as noted in detailed exam. Musculoskeletal: Unremarkable, except as noted in detailed exam.  Right knee exam: GAIT: moderate limp and uses no assistive devices. ALIGNMENT: moderate valgus SKIN: unremarkable SWELLING: mild EFFUSION: small WARMTH: no warmth TENDERNESS: mild over the lateral joint line and medial joint line ROM: 0 to 95 degrees with mild discomfort at the extremes of flexion and extension McMURRAY'S: negative PATELLOFEMORAL: normal tracking with no peri-patellar tenderness and negative apprehension sign CREPITUS: no LACHMAN'S: negative PIVOT SHIFT: negative ANTERIOR DRAWER: negative POSTERIOR DRAWER: negative VARUS/VALGUS: positive pseudolaxity to valgus stressing  She is neurovascularly intact to the right lower extremity and foot.  Knee Imaging: AP weightbearing of both knees, as well as lateral and merchant views of the right knee are obtained. These films demonstrate severe degenerative changes, primarily involving the lateral compartment with 100% lateral joint space narrowing. Overall alignment is in moderate valgus. No fractures, lytic lesions, or abnormal calcifications are noted.  Assessment:   . Primary  osteoarthritis of right knee   Plan: The treatment options were discussed with the patient. In addition, patient educational materials were provided regarding the diagnosis and treatment options. The patient is quite frustrated by his symptoms and functional limitations, and is ready to consider more aggressive treatment options. Therefore, I have recommended a surgical procedure, specifically a right total knee arthroplasty. The procedure was discussed with the patient, as were the potential risks (including bleeding, infection, nerve and/or blood vessel injury, persistent or recurrent pain, loosening and/or failure of the components, dislocation, need for further surgery, blood clots, strokes, heart attacks and/or arhythmias, pneumonia, etc.) and benefits. The patient states her understanding and wishes to proceed. All of the patient's questions and concerns were answered. She can call any time with further concerns. She will follow up post-surgery, routine.   H&P reviewed and patient re-examined. No changes.

## 2019-12-01 NOTE — Anesthesia Preprocedure Evaluation (Addendum)
Anesthesia Evaluation  Patient identified by MRN, date of birth, ID band Patient awake    Reviewed: Allergy & Precautions, H&P , NPO status , Patient's Chart, lab work & pertinent test results  Airway Mallampati: III  TM Distance: <3 FB    Comment: Relatively small mouth, small chin Dental  (+) Teeth Intact   Pulmonary neg pulmonary ROS, neg COPD,    breath sounds clear to auscultation       Cardiovascular hypertension, (-) angina(-) Past MI and (-) Cardiac Stents (-) dysrhythmias  Rhythm:regular Rate:Normal     Neuro/Psych CVA, No Residual Symptoms negative psych ROS   GI/Hepatic Neg liver ROS, GERD  Controlled,  Endo/Other  Morbid obesity  Renal/GU      Musculoskeletal   Abdominal   Peds  Hematology Plavix last dose 6 days ago   Anesthesia Other Findings Past Medical History: No date: Arthritis     Comment:  right knee No date: Carotid artery occlusion No date: Dizziness No date: GERD (gastroesophageal reflux disease) No date: Heart murmur No date: History of colon polyps No date: Hyperlipidemia No date: Hypertension 12-31-12: Stroke ([place])     Comment:  no residual effects  Past Surgical History: No date: CAROTID ENDARTERECTOMY     Comment:  RIGHT  01/19/13 08-18-12: COLONOSCOPY 03/10/2018: COLONOSCOPY WITH PROPOFOL; N/A     Comment:  Procedure: COLONOSCOPY WITH PROPOFOL;  Surgeon:               Lollie Sails, MD;  Location: [facility] ENDOSCOPY;                Service: Endoscopy;  Laterality: N/A; No date: DILATION AND CURETTAGE OF UTERUS 01/19/2013: ENDARTERECTOMY; Right     Comment:  Procedure: ENDARTERECTOMY CAROTID with patch               angioplasty;  Surgeon: Conrad Silverton, MD;  Location: [place];  Service: Vascular;  Laterality: Right; No date: ESOPHAGEAL DILATION 04/15/2017: JOINT REPLACEMENT; Left No date: TUBAL LIGATION 08-18-12: UPPER GI ENDOSCOPY      Reproductive/Obstetrics negative OB ROS                            Anesthesia Physical Anesthesia Plan  ASA: III  Anesthesia Plan: Spinal   Post-op Pain Management:    Induction:   PONV Risk Score and Plan:   Airway Management Planned: Natural Airway and Simple Face Mask  Additional Equipment:   Intra-op Plan:   Post-operative Plan:   Informed Consent: I have reviewed the patients History and Physical, chart, labs and discussed the procedure including the risks, benefits and alternatives for the proposed anesthesia with the patient or authorized representative who has indicated his/her understanding and acceptance.     Dental Advisory Given  Plan Discussed with: Anesthesiologist, CRNA and Surgeon  Anesthesia Plan Comments:        Anesthesia Quick Evaluation

## 2019-12-02 ENCOUNTER — Other Ambulatory Visit: Payer: Self-pay

## 2019-12-02 LAB — CBC
HCT: 32 % — ABNORMAL LOW (ref 36.0–46.0)
Hemoglobin: 10.9 g/dL — ABNORMAL LOW (ref 12.0–15.0)
MCH: 30.8 pg (ref 26.0–34.0)
MCHC: 34.1 g/dL (ref 30.0–36.0)
MCV: 90.4 fL (ref 80.0–100.0)
Platelets: 196 10*3/uL (ref 150–400)
RBC: 3.54 MIL/uL — ABNORMAL LOW (ref 3.87–5.11)
RDW: 12.4 % (ref 11.5–15.5)
WBC: 8.1 10*3/uL (ref 4.0–10.5)
nRBC: 0 % (ref 0.0–0.2)

## 2019-12-02 LAB — BASIC METABOLIC PANEL
Anion gap: 7 (ref 5–15)
BUN: 9 mg/dL (ref 8–23)
CO2: 28 mmol/L (ref 22–32)
Calcium: 8.1 mg/dL — ABNORMAL LOW (ref 8.9–10.3)
Chloride: 105 mmol/L (ref 98–111)
Creatinine, Ser: 0.72 mg/dL (ref 0.44–1.00)
GFR calc Af Amer: >60 mL/min (ref >60)
GFR calc non Af Amer: >60 mL/min (ref >60)
Glucose, Bld: 126 mg/dL — ABNORMAL HIGH (ref 70–99)
Potassium: 4.5 mmol/L (ref 3.5–5.1)
Sodium: 140 mmol/L (ref 135–145)

## 2019-12-02 NOTE — Progress Notes (Signed)
Physical Therapy Treatment Patient Details Name: Ellen Baker MRN: [id number] DOB: 18-Apr-1951 Today's Date: 12/02/2019    History of Present Illness admitted for acute hospitalization s/p R TKR, WBAT (12/01/19)    PT Comments    Able to initiate stair training (up/down 4 with bilat rails, cga/min assist), does require UE support for safety with task.  Patient generally hesitant with task, but pleased with performance once complete. Limited tolerance for R LE extension; educated in and emphasized positioning and R LE quad sets in supine to promote R TKE. Patient voiced understanding; voices plans to utilize CPM overnight.     Follow Up Recommendations  Home health PT     Equipment Recommendations       Recommendations for Other Services       Precautions / Restrictions Precautions Precautions: Fall Restrictions Weight Bearing Restrictions: Yes RLE Weight Bearing: Weight bearing as tolerated    Mobility  Bed Mobility Overal bed mobility: Needs Assistance Bed Mobility: Supine to Sit;Sit to Supine     Supine to sit: Supervision Sit to supine: Supervision   General bed mobility comments: able to negotiate R LE over edge of bed without assist this date  Transfers Overall transfer level: Needs assistance Equipment used: Rolling walker (2 wheeled) Transfers: Sit to/from Stand Sit to Stand: Min guard         General transfer comment: cuing for hand placement; maintains R LE in slight extension for decreased Wbing/active use with movement transition. Mild use of momentum for initial lift off  Ambulation/Gait Ambulation/Gait assistance: Min Gaffer (Feet):  (120' x2) Assistive device: Rolling walker (2 wheeled)       General Gait Details: progressing towards reciprocal stepping pattern with fair/good stance time R LE; mild R hip retraction and mildly antalgic in loading phase, but good knee control.  Decreased cadence, but no overt  buckling or LOB.   Stairs Stairs: Yes Stairs assistance: Min guard   Number of Stairs: 4 General stair comments: step to gait pattern, good R knee control/stability in modified SLS; patient generally hesitant, but pleased with performance once complete   Wheelchair Mobility    Modified Rankin (Stroke Patients Only)       Balance Overall balance assessment: Needs assistance Sitting-balance support: No upper extremity supported;Feet supported Sitting balance-Leahy Scale: Good     Standing balance support: Bilateral upper extremity supported Standing balance-Leahy Scale: Fair                              Cognition Arousal/Alertness: Awake/alert Behavior During Therapy: WFL for tasks assessed/performed Overall Cognitive Status: Within Functional Limits for tasks assessed                                        Exercises Total Joint Exercises Goniometric ROM: R knee: 10-78 degrees, limited by pain Other Exercises Other Exercises: Toilet transfer, ambulatory without assist device, cga/close sup; sit/stand from [facility] with RW, cga; standing balance for peri-care, hand hygiene, close sup. Good awareness into limits of stability and overall safety needs.    General Comments        Pertinent Vitals/Pain Pain Assessment: Faces Faces Pain Scale: Hurts little more Pain Location: R knee Pain Descriptors / Indicators: Aching;Grimacing Pain Intervention(s): Limited activity within patient's tolerance;Monitored during session;Premedicated before session;Repositioned    Home Living  Prior Function            PT Goals (current goals can now be found in the care plan section) Acute Rehab PT Goals Patient Stated Goal: to get better PT Goal Formulation: With patient Time For Goal Achievement: 12/15/19 Potential to Achieve Goals: Good Progress towards PT goals: Progressing toward goals    Frequency    BID      PT  Plan Current plan remains appropriate    Co-evaluation              AM-PAC PT "6 Clicks" Mobility   Outcome Measure  Help needed turning from your back to your side while in a flat bed without using bedrails?: None Help needed moving from lying on your back to sitting on the side of a flat bed without using bedrails?: None Help needed moving to and from a bed to a chair (including a wheelchair)?: A Little Help needed standing up from a chair using your arms (e.g., wheelchair or bedside chair)?: A Little Help needed to walk in hospital room?: A Little Help needed climbing 3-5 steps with a railing? : A Little 6 Click Score: 20    End of Session Equipment Utilized During Treatment: Gait belt Activity Tolerance: Patient tolerated treatment well Patient left: with call bell/phone within reach;in bed;with bed alarm set Nurse Communication: Mobility status PT Visit Diagnosis: Muscle weakness (generalized) (M62.81);Other abnormalities of gait and mobility (R26.89);Pain Pain - Right/Left: Right Pain - part of body: Knee     Time: 1420-1459 PT Time Calculation (min) (ACUTE ONLY): 39 min  Charges:  $Gait Training: 8-22 mins $Therapeutic Activity: 23-37 mins                     Carisha Kantor H. Owens Shark, PT, DPT, NCS 12/02/19, 3:50 PM [phone]

## 2019-12-02 NOTE — Progress Notes (Signed)
   Subjective: 1 Day Post-Op Procedure(s) (LRB): TOTAL KNEE ARTHROPLASTY - RNFA (Right) Patient reports pain as 0 on 0-10 scale.   Patient is well, and has had no acute complaints or problems Denies any CP, SOB, ABD pain. We will continue therapy today.  Plan is to go Home after hospital stay.  Objective: Vital signs in last 24 hours: Temp:  [96.8 F (36 C)-97.8 F (36.6 C)] 97.8 F (36.6 C) (06/16 0719) Pulse Rate:  [42-103] 62 (06/16 0719) Resp:  [13-21] 18 (06/16 0719) BP: (100-167)/(45-87) 145/45 (06/16 0719) SpO2:  [94 %-100 %] 95 % (06/16 0719) Weight:  [99.8 kg] 99.8 kg (06/15 0939)  Intake/Output from previous day: 06/15 0701 - 06/16 0700 In: 2524.4 [I.V.:2032.8; IV Piggyback:491.6] Out: 10 [Blood:10] Intake/Output this shift: No intake/output data recorded.  Recent Labs    12/02/19 0439  HGB 10.9*   Recent Labs    12/02/19 0439  WBC 8.1  RBC 3.54*  HCT 32.0*  PLT 196   Recent Labs    12/02/19 0439  NA 140  K 4.5  CL 105  CO2 28  BUN 9  CREATININE 0.72  GLUCOSE 126*  CALCIUM 8.1*   No results for input(s): LABPT, INR in the last 72 hours.  EXAM General - Patient is Alert, Appropriate and Oriented Extremity - Neurovascular intact Sensation intact distally Intact pulses distally Dorsiflexion/Plantar flexion intact No cellulitis present Compartment soft Dressing - dressing C/D/I and no drainage Motor Function - intact, moving foot and toes well on exam.   Past Medical History:  Diagnosis Date  . Arthritis    right knee  . Carotid artery occlusion   . Dizziness   . GERD (gastroesophageal reflux disease)   . Heart murmur   . History of colon polyps   . Hyperlipidemia   . Hypertension   . Stroke [facility]) 12-31-12   no residual effects    Assessment/Plan:   1 Day Post-Op Procedure(s) (LRB): TOTAL KNEE ARTHROPLASTY - RNFA (Right) Active Problems:   Status post total knee replacement using cement, right  Estimated body mass index is  38.97 kg/m as calculated from the following:   Height as of this encounter: 5\' 3"  (1.6 m).   Weight as of this encounter: 99.8 kg. Advance diet Up with therapy  Needs BM Labs and VSS Pain well controlled CM To assist with discharge to home with HHPT  DVT Prophylaxis - Lovenox, TED hose and SCDs Weight-Bearing as tolerated to right leg   T. Rachelle Hora, PA-C [place] 12/02/2019, 8:13 AM

## 2019-12-02 NOTE — Anesthesia Postprocedure Evaluation (Signed)
Anesthesia Post Note  Patient: Valma Cava Marlette  Procedure(s) Performed: TOTAL KNEE ARTHROPLASTY - RNFA (Right Knee)  Patient location during evaluation: Nursing Unit Anesthesia Type: Spinal Level of consciousness: awake and alert and oriented Pain management: pain level controlled Vital Signs Assessment: post-procedure vital signs reviewed and stable Respiratory status: respiratory function stable Cardiovascular status: stable Postop Assessment: no headache, no backache, patient able to bend at knees, no apparent nausea or vomiting, able to ambulate and adequate PO intake Anesthetic complications: no   No complications documented.   Last Vitals:  Vitals:   12/02/19 0719 12/02/19 0831  BP: (!) 145/45 122/68  Pulse: 62   Resp: 18   Temp: 36.6 C   SpO2: 95%     Last Pain:  Vitals:   12/02/19 0942  TempSrc:   PainSc: 2                  Lanora Manis

## 2019-12-02 NOTE — TOC Benefit Eligibility Note (Signed)
Transition of Care [facility]) Benefit Eligibility Note    Patient Details  Name: Ellen Baker MRN: [id number] Date of Birth: 1951/01/02   Medication/Dose: ENOXAPARIN   40 MG  DAILY SQ X 14 DAYS  Covered?: Yes  Tier:  (TIER- 4 DRUG)  Prescription Coverage Preferred Pharmacy: [facility] with Person/Company/Phone Number:: CANACE  @ [employer] # [phone]  Co-Pay: $ 90.00  Prior Approval: No  Deductible:  (NO DEDUCTIBLE WITH PLAN)  Additional Notes: LOVENOX  40 MG DAILY SQ X 14 DAYS : NOT COVER  and  NON-FORMULARY    P/A-YES # [phone]    Memory Argue Phone Number: 12/02/2019, 12:19 PM

## 2019-12-02 NOTE — Progress Notes (Signed)
Physical Therapy Treatment Patient Details Name: Ellen Baker MRN: [id number] DOB: 04/21/1951 Today's Date: 12/02/2019    History of Present Illness admitted for acute hospitalization s/p R TKR, WBAT (12/01/19)    PT Comments    Progressed to completing gait around nursing station (200') with RW, cga/close sup; progressing to step through gait pattern with fair/good stance time R LE.  Mild R hip retraction in loading phases of gait; improved with cuing for postural and R hip extension.   Refused up in chair end of session, requesting return to bed; positioned with R knee in extension, educated/encouraged to maintain appropriate position.    Follow Up Recommendations  Home health PT     Equipment Recommendations   (has equipment necessary)    Recommendations for Other Services       Precautions / Restrictions Precautions Precautions: Fall Restrictions Weight Bearing Restrictions: Yes RLE Weight Bearing: Weight bearing as tolerated    Mobility  Bed Mobility Overal bed mobility: Needs Assistance Bed Mobility: Supine to Sit;Sit to Supine     Supine to sit: Modified independent (Device/Increase time) Sit to supine: Min assist      Transfers   Equipment used: Rolling walker (2 wheeled) Transfers: Sit to/from [employer] transfer comment: cuing for hand placement; maintains R LE in slight extension for decreased Wbing/active use with movement transition  Ambulation/Gait Ambulation/Gait assistance: Min guard;Supervision Gait Distance (Feet): 200 Feet Assistive device: Rolling walker (2 wheeled)   Gait velocity: 10' walk time, 15 seconds   General Gait Details: progressing towards reciprocal stepping pattern with fair/good stance time R LE; mild R hip retraction in loading phase, but good knee control.   Stairs             Wheelchair Mobility    Modified Rankin (Stroke Patients Only)       Balance Overall balance assessment:  Needs assistance Sitting-balance support: No upper extremity supported;Feet supported Sitting balance-Leahy Scale: Good     Standing balance support: Bilateral upper extremity supported Standing balance-Leahy Scale: Fair                              Cognition Arousal/Alertness: Awake/alert Behavior During Therapy: WFL for tasks assessed/performed Overall Cognitive Status: Within Functional Limits for tasks assessed                                        Exercises Other Exercises Other Exercises: R LE seated therex, 1x10, active ROM: ankle pumps, LAQs, marching with emphasis on R knee flex/ext.    General Comments        Pertinent Vitals/Pain Pain Assessment: 0-10 Pain Score: 6  Pain Location: R knee Pain Descriptors / Indicators: Aching;Grimacing Pain Intervention(s): Limited activity within patient's tolerance;Monitored during session;Premedicated before session;Repositioned    Home Living                      Prior Function            PT Goals (current goals can now be found in the care plan section) Acute Rehab PT Goals Patient Stated Goal: to get better PT Goal Formulation: With patient Time For Goal Achievement: 12/15/19 Potential to Achieve Goals: Good Progress towards PT goals: Progressing toward goals    Frequency  BID      PT Plan Current plan remains appropriate    Co-evaluation              AM-PAC PT "6 Clicks" Mobility   Outcome Measure  Help needed turning from your back to your side while in a flat bed without using bedrails?: A Little Help needed moving from lying on your back to sitting on the side of a flat bed without using bedrails?: A Little Help needed moving to and from a bed to a chair (including a wheelchair)?: A Little Help needed standing up from a chair using your arms (e.g., wheelchair or bedside chair)?: A Little Help needed to walk in hospital room?: A Little Help needed  climbing 3-5 steps with a railing? : A Little 6 Click Score: 18    End of Session Equipment Utilized During Treatment: Gait belt Activity Tolerance: Patient tolerated treatment well Patient left: in chair;with call bell/phone within reach;with chair alarm set Nurse Communication: Mobility status PT Visit Diagnosis: Muscle weakness (generalized) (M62.81);Other abnormalities of gait and mobility (R26.89);Pain Pain - Right/Left: Right Pain - part of body: Knee     Time: 1003-1030 PT Time Calculation (min) (ACUTE ONLY): 27 min  Charges:  $Gait Training: 8-22 mins $Therapeutic Exercise: 8-22 mins                     Kelsy Polack H. Owens Shark, PT, DPT, NCS 12/02/19, 10:41 AM [phone]

## 2019-12-02 NOTE — TOC Initial Note (Signed)
Transition of Care [facility]) - Initial/Assessment Note    Patient Details  Name: Ellen Baker MRN: [id number] Date of Birth: 1951/03/29  Transition of [facility]) CM/SW Contact:    Su Hilt, RN Phone Number: 12/02/2019, 11:16 AM  Clinical Narrative:                 Spoke with the patient to discuss DC plan and needs She lives alone but her daughter will be staying with her and can provide transportation She has boarded her dog for 14 days  She has DME at home and does not need additonal She is set [facility] with [facility] for [facility] services I have requested the price of Lovenox and will provide the cost once obtained   Expected Discharge Plan: [place] Barriers to Discharge: Continued Medical Work up   Patient Goals and CMS Choice Patient states their goals for this hospitalization and ongoing recovery are:: go home      Expected Discharge Plan and Services Expected Discharge Plan: [place]   Discharge Planning Services: CM Consult   Living arrangements for the past 2 months: Single Family Home                 DME Arranged: N/A         HH Arranged: PT HH Agency: [facility] (formerly [employer]) Date [place]: 12/02/19 Time [place]: 1115 Representative spoke with at [place]: Helene Kelp  Prior Living Arrangements/Services Living arrangements for the past 2 months: [place] Lives with:: Self (Daughter will be staying with her) Patient language and need for interpreter reviewed:: Yes Do you feel safe going back to the place where you live?: Yes      Need for Family Participation in Patient Care: No (Comment) Care giver support system in place?: Yes (comment) Current home services: DME (rolling walker, reacher, 3 in 1, sock applier) Criminal Activity/Legal Involvement Pertinent to Current Situation/Hospitalization: No - Comment as needed  Activities of Daily Living Home Assistive  Devices/Equipment: Cane (specify quad or straight) (4 pt) ADL Screening (condition at time of admission) Patient's cognitive ability adequate to safely complete daily activities?: Yes Is the patient deaf or have difficulty hearing?: No Does the patient have difficulty seeing, even when wearing glasses/contacts?: No Does the patient have difficulty concentrating, remembering, or making decisions?: No Patient able to express need for assistance with ADLs?: Yes Does the patient have difficulty dressing or bathing?: No Independently performs ADLs?: Yes (appropriate for developmental age) Does the patient have difficulty walking or climbing stairs?: Yes Weakness of Legs: Right Weakness of Arms/Hands: None  Permission Sought/Granted   Permission granted to share information with : Yes, Verbal Permission Granted              Emotional Assessment Appearance:: Appears stated age Attitude/Demeanor/Rapport: Engaged Affect (typically observed): Appropriate Orientation: : Oriented to Self, Oriented to Place, Oriented to  Time, Oriented to Situation Alcohol / Substance Use: Not Applicable Psych Involvement: No (comment)  Admission diagnosis:  Status post total knee replacement using cement, right [Z96.651] Patient Active Problem List   Diagnosis Date Noted  . Status post total knee replacement using cement, right 12/01/2019  . B12 deficiency 09/24/2018  . Normocytic anemia 07/20/2018  . Cerebral vascular insufficiency 06/06/2015  . Dyslipidemia 06/06/2015  . Aftercare following surgery of the circulatory system, [place] 08/03/2013  . Occlusion and stenosis of carotid artery without mention of cerebral infarction  01/16/2013  . Pre-operative cardiovascular examination 01/16/2013  . Carotid artery stenosis, symptomatic 01/02/2013  . CVA (cerebral infarction) 12/31/2012  . HTN (hypertension) 12/31/2012  . GERD (gastroesophageal reflux disease) 12/31/2012   PCP:  Juline Patch, MD Pharmacy:    [facility] [place], [address] AT [address] [facility] [place] [address] Phone: [phone] Fax: [phone]     Social Determinants of Health (SDOH) Interventions    Readmission Risk Interventions No flowsheet data found.

## 2019-12-03 ENCOUNTER — Encounter: Payer: Self-pay | Admitting: Surgery

## 2019-12-03 LAB — BASIC METABOLIC PANEL
Anion gap: 10 (ref 5–15)
BUN: 7 mg/dL — ABNORMAL LOW (ref 8–23)
CO2: 28 mmol/L (ref 22–32)
Calcium: 8.4 mg/dL — ABNORMAL LOW (ref 8.9–10.3)
Chloride: 99 mmol/L (ref 98–111)
Creatinine, Ser: 0.64 mg/dL (ref 0.44–1.00)
GFR calc Af Amer: >60 mL/min (ref >60)
GFR calc non Af Amer: >60 mL/min (ref >60)
Glucose, Bld: 119 mg/dL — ABNORMAL HIGH (ref 70–99)
Potassium: 3.9 mmol/L (ref 3.5–5.1)
Sodium: 137 mmol/L (ref 135–145)

## 2019-12-03 LAB — CBC
HCT: 33.4 % — ABNORMAL LOW (ref 36.0–46.0)
Hemoglobin: 11 g/dL — ABNORMAL LOW (ref 12.0–15.0)
MCH: 30.7 pg (ref 26.0–34.0)
MCHC: 32.9 g/dL (ref 30.0–36.0)
MCV: 93.3 fL (ref 80.0–100.0)
Platelets: 193 10*3/uL (ref 150–400)
RBC: 3.58 MIL/uL — ABNORMAL LOW (ref 3.87–5.11)
RDW: 12.4 % (ref 11.5–15.5)
WBC: 11 10*3/uL — ABNORMAL HIGH (ref 4.0–10.5)
nRBC: 0 % (ref 0.0–0.2)

## 2019-12-03 MED ORDER — TRAMADOL HCL 50 MG PO TABS: 50.0000 mg | ORAL_TABLET | Freq: Four times a day (QID) | ORAL | 0 refills | Status: DC | PRN

## 2019-12-03 MED ORDER — ENOXAPARIN SODIUM 40 MG/0.4ML ~~LOC~~ SOLN: 40.0000 mg | SUBCUTANEOUS | 0 refills | Status: DC

## 2019-12-03 MED ORDER — OXYCODONE HCL 5 MG PO TABS: 5.0000 mg | ORAL_TABLET | ORAL | 0 refills | Status: DC | PRN

## 2019-12-03 NOTE — Discharge Summary (Signed)
Physician Discharge Summary  Patient ID: Ellen Baker MRN: [id number] DOB/AGE: December 15, 1950 69 y.o.  Admit date: 12/01/2019 Discharge date: 12/03/2019  Admission Diagnoses:  Status post total knee replacement using cement, right [Z96.651]   Discharge Diagnoses: Patient Active Problem List   Diagnosis Date Noted  . Status post total knee replacement using cement, right 12/01/2019  . B12 deficiency 09/24/2018  . Normocytic anemia 07/20/2018  . Cerebral vascular insufficiency 06/06/2015  . Dyslipidemia 06/06/2015  . Aftercare following surgery of the circulatory system, [place] 08/03/2013  . Occlusion and stenosis of carotid artery without mention of cerebral infarction 01/16/2013  . Pre-operative cardiovascular examination 01/16/2013  . Carotid artery stenosis, symptomatic 01/02/2013  . CVA (cerebral infarction) 12/31/2012  . HTN (hypertension) 12/31/2012  . GERD (gastroesophageal reflux disease) 12/31/2012    Past Medical History:  Diagnosis Date  . Arthritis    right knee  . Carotid artery occlusion   . Dizziness   . GERD (gastroesophageal reflux disease)   . Heart murmur   . History of colon polyps   . Hyperlipidemia   . Hypertension   . Stroke [facility]) 12-31-12   no residual effects     Transfusion: none   Consultants (if any):   Discharged Condition: Improved  Hospital Course: Ellen Baker Payment is an 69 y.o. female who was admitted 12/01/2019 with a diagnosis of right knee osteoarthritis and went to the operating room on 12/01/2019 and underwent the above named procedures.    Surgeries: Procedure(s): TOTAL KNEE ARTHROPLASTY - RNFA on 12/01/2019 Patient tolerated the surgery well. Taken to PACU where she was stabilized and then transferred to the orthopedic floor.  Started on Lovenox 40 mg q 24 hrs. Foot pumps applied bilaterally at 80 mm. Heels elevated on bed with rolled towels. No evidence of DVT. Negative [place]. Physical therapy started on day #1 for gait  training and transfer. OT started day #1 for ADL and assisted devices.  Patient's foley was d/c on day #1. Patient's IV was d/c on day #2.  On post op day #2 patient was stable and ready for discharge to home with HHPT.  Implants: Right TKA using all-cemented [employer] Vanguard system with a 65 mm PCR femur, a 71 mm tibial tray with a 12 mm anterior stabilized E-poly insert, and a 34 x 8.5 mm all-poly 3-pegged domed patella.  She was given perioperative antibiotics:  Anti-infectives (From admission, onward)   Start     Dose/Rate Route Frequency Ordered Stop   12/01/19 1430  ceFAZolin (ANCEF) IVPB 2g/100 mL premix        2 g 200 mL/hr over 30 Minutes Intravenous Every 6 hours 12/01/19 1346 12/02/19 0302   12/01/19 0850  ceFAZolin (ANCEF) 2-4 GM/100ML-% IVPB       Note to Pharmacy: Larita Fife   : cabinet override      12/01/19 0850 12/01/19 1029   12/01/19 0600  ceFAZolin (ANCEF) IVPB 2g/100 mL premix        2 g 200 mL/hr over 30 Minutes Intravenous On call to O.R. 12/01/19 0010 12/01/19 1058    .  She was given sequential compression devices, early ambulation, and Lovenox TEDs for DVT prophylaxis.  She benefited maximally from the hospital stay and there were no complications.    Recent vital signs:  Vitals:   12/03/19 0005 12/03/19 0723  BP: (!) 158/89 (!) 159/66  Pulse: 98 83  Resp: 20 16  Temp: 99.1 F (37.3 C) 98.2 F (36.8 C)  SpO2: 93%  93%    Recent laboratory studies:  Lab Results  Component Value Date   HGB 11.0 (L) 12/03/2019   HGB 10.9 (L) 12/02/2019   HGB 12.5 11/27/2019   Lab Results  Component Value Date   WBC 11.0 (H) 12/03/2019   PLT 193 12/03/2019   Lab Results  Component Value Date   INR 1.07 01/16/2013   Lab Results  Component Value Date   NA 137 12/03/2019   K 3.9 12/03/2019   CL 99 12/03/2019   CO2 28 12/03/2019   BUN 7 (L) 12/03/2019   CREATININE 0.64 12/03/2019   GLUCOSE 119 (H) 12/03/2019    Discharge Medications:   Allergies  as of 12/03/2019      Reactions   Adhesive [tape] Rash   Skin became RAW      Medication List    STOP taking these medications   Aleve 220 MG tablet Generic drug: naproxen sodium   clopidogrel 75 MG tablet Commonly known as: PLAVIX     TAKE these medications   Centrum Silver Ultra Womens Tabs Take 1 tablet by mouth daily.   enoxaparin 40 MG/0.4ML injection Commonly known as: LOVENOX Inject 0.4 mLs (40 mg total) into the skin daily for 14 days. Start taking on: December 04, 2019   Iron-Vitamin C 65-125 MG Tabs Take 1 tablet by mouth 2 (two) times daily.   loratadine 10 MG tablet Commonly known as: CLARITIN Take 1 tablet (10 mg total) by mouth continuous as needed for allergies.   losartan 50 MG tablet Commonly known as: COZAAR TAKE 1 TABLET BY MOUTH DAILY What changed:   how much to take  how to take this  when to take this  additional instructions   meclizine 25 MG tablet Commonly known as: ANTIVERT Take 1 tablet (25 mg total) by mouth 3 (three) times daily as needed for dizziness.   omeprazole 20 MG capsule Commonly known as: PRILOSEC Take 20 mg by mouth 3 (three) times a week.   omeprazole 10 MG capsule Commonly known as: PriLOSEC Take 1 capsule (10 mg total) by mouth daily. Patient is not sure of doseage- 20mg  qday otc   oxyCODONE 5 MG immediate release tablet Commonly known as: Oxy IR/ROXICODONE Take 1-2 tablets (5-10 mg total) by mouth every 4 (four) hours as needed for moderate pain (pain score 4-6).   simvastatin 20 MG tablet Commonly known as: ZOCOR Take 1 tablet (20 mg total) by mouth daily.   traMADol 50 MG tablet Commonly known as: ULTRAM Take 1 tablet (50 mg total) by mouth every 6 (six) hours as needed for moderate pain.   vitamin B-12 1000 MCG tablet Commonly known as: CYANOCOBALAMIN Take 1,000 mcg by mouth daily.            Durable Medical Equipment  (From admission, onward)         Start     Ordered   12/01/19 1347  DME  Bedside commode  Once       Question:  Patient needs a bedside commode to treat with the following condition  Answer:  Status post total knee replacement using cement, right   12/01/19 1346   12/01/19 1347  DME 3 n 1  Once        12/01/19 1346   12/01/19 1347  DME Walker rolling  Once       Question Answer Comment  Walker: With 5 Inch Wheels   Patient needs a walker to treat with the following condition Status  post total knee replacement using cement, right      12/01/19 1346          Diagnostic Studies: DG Knee Right Port  Result Date: 12/01/2019 CLINICAL DATA:  Status post right total knee replacement. EXAM: PORTABLE RIGHT KNEE - 1-2 VIEW COMPARISON:  None. FINDINGS: The femoral and tibial components appear to be well situated. Expected postoperative changes are seen involving the soft tissues anteriorly. IMPRESSION: Status post right total knee arthroplasty. Electronically Signed   By: Marijo Conception M.D.   On: 12/01/2019 13:30    Disposition:      Follow-up Information    Lattie Corns, PA-C Follow up in 2 week(s).   Specialty: Physician Assistant Contact information: [place] [address] [phone]                Signed: Feliberto Gottron 12/03/2019, 9:49 AM

## 2019-12-03 NOTE — Progress Notes (Signed)
Discharge summary reviewed with verbal understanding. Lovenox education and kit given. Answered all questions.

## 2019-12-03 NOTE — TOC Transition Note (Signed)
Transition of Care [facility]) - Progression Note    Patient Details  Name: Ellen Baker MRN: [id number] Date of Birth: [birth year]  Transition of Care [facility]) CM/SW [place], RN Phone Number: 12/03/2019, 11:23 AM  Clinical Narrative:     Patient to DC home today, I notified her of the price of Lovenox at $90 copay, she states understanding and says she can afford her medications,  She is se tup with [facility] and has no DME needs  Expected Discharge Plan: [place] Barriers to Discharge: Continued Medical Work up  Expected Discharge Plan and Services Expected Discharge Plan: [place]   Discharge Planning Services: CM Consult   Living arrangements for the past 2 months: Single Family Home Expected Discharge Date: 12/03/19               DME Arranged: N/A         HH Arranged: PT HH [facility]: [facility] ([facility] [employer] Date [place]: 12/02/19 Time [place]: 1115 Representative spoke with at [place]: [place] ([place]) Interventions    Readmission Risk Interventions No flowsheet data found.

## 2019-12-03 NOTE — Progress Notes (Signed)
Per PA(Gaines) stated ok to discharge without BM, must follow bowel regime at home.

## 2019-12-03 NOTE — Progress Notes (Signed)
   Subjective: 2 Days Post-Op Procedure(s) (LRB): TOTAL KNEE ARTHROPLASTY - RNFA (Right) Patient reports pain as mild.   Patient is well, and has had no acute complaints or problems Denies any CP, SOB, ABD pain. We will continue therapy today.  Plan is to go Home after hospital stay.  Objective: Vital signs in last 24 hours: Temp:  [97.4 F (36.3 C)-99.1 F (37.3 C)] 98.2 F (36.8 C) (06/17 0723) Pulse Rate:  [65-105] 83 (06/17 0723) Resp:  [16-20] 16 (06/17 0723) BP: (145-160)/(66-90) 159/66 (06/17 0723) SpO2:  [91 %-95 %] 93 % (06/17 0723)  Intake/Output from previous day: 06/16 0701 - 06/17 0700 In: 1251.6 [P.O.:720; I.V.:531.6] Out: -  Intake/Output this shift: No intake/output data recorded.  Recent Labs    12/02/19 0439 12/03/19 0557  HGB 10.9* 11.0*   Recent Labs    12/02/19 0439 12/03/19 0557  WBC 8.1 11.0*  RBC 3.54* 3.58*  HCT 32.0* 33.4*  PLT 196 193   Recent Labs    12/02/19 0439 12/03/19 0557  NA 140 137  K 4.5 3.9  CL 105 99  CO2 28 28  BUN 9 7*  CREATININE 0.72 0.64  GLUCOSE 126* 119*  CALCIUM 8.1* 8.4*   No results for input(s): LABPT, INR in the last 72 hours.  EXAM General - Patient is Alert, Appropriate and Oriented Extremity - Neurovascular intact Sensation intact distally Intact pulses distally Dorsiflexion/Plantar flexion intact No cellulitis present Compartment soft Dressing - dressing C/D/I and no drainage Motor Function - intact, moving foot and toes well on exam.   Past Medical History:  Diagnosis Date  . Arthritis    right knee  . Carotid artery occlusion   . Dizziness   . GERD (gastroesophageal reflux disease)   . Heart murmur   . History of colon polyps   . Hyperlipidemia   . Hypertension   . Stroke [facility]) 12-31-12   no residual effects    Assessment/Plan:   2 Days Post-Op Procedure(s) (LRB): TOTAL KNEE ARTHROPLASTY - RNFA (Right) Active Problems:   Status post total knee replacement using cement,  right  Estimated body mass index is 38.97 kg/m as calculated from the following:   Height as of this encounter: 5\' 3"  (1.6 m).   Weight as of this encounter: 99.8 kg. Advance diet Up with therapy  Needs BM, passing gas.  Labs and VSS Pain well controlled CM To assist with discharge to home with HHPT today.   DVT Prophylaxis - Lovenox, TED hose and SCDs Weight-Bearing as tolerated to right leg   T. Rachelle Hora, PA-C [place] 12/03/2019, 9:44 AM

## 2019-12-03 NOTE — Progress Notes (Signed)
Physical Therapy Treatment Patient Details Name: Ellen Baker MRN: [id number] DOB: 31-Dec-1950 Today's Date: 12/03/2019    History of Present Illness admitted for acute hospitalization s/p R TKR, WBAT (12/01/19)    PT Comments    Split session this am.  8:34-9:43   10:38-11:07  Pt on commode upon arrival, ready to get up.  Min guard to stand and transfer to chair.  Pt stated she received pain meds prior and they had not kicked in yet asking me to return later.  Returned 45 minutes later and she is able to stand and walk to/from gym for stair training.  Generally comfortable and stable with gait.  Participated in exercises as described below.  Requested to return to bed to await discharge home.   Follow Up Recommendations  Home health PT;Supervision for mobility/OOB     Equipment Recommendations       Recommendations for Other Services       Precautions / Restrictions Precautions Precautions: Fall Restrictions Weight Bearing Restrictions: Yes RLE Weight Bearing: Weight bearing as tolerated    Mobility  Bed Mobility Overal bed mobility: Needs Assistance Bed Mobility: Supine to Sit;Sit to Supine     Supine to sit: Min assist Sit to supine: Min assist      Transfers Overall transfer level: Needs assistance Equipment used: Rolling walker (2 wheeled) Transfers: Sit to/from Stand Sit to Stand: Min guard            Ambulation/Gait Ambulation/Gait assistance: Min Gaffer (Feet): 200 Feet Assistive device: Rolling walker (2 wheeled) Gait Pattern/deviations: Step-to pattern;Step-through pattern     General Gait Details: progressing to step-through pattern with gait   Stairs Stairs: Yes Stairs assistance: Min guard Stair Management: Two rails Number of Stairs: 4     Wheelchair Mobility    Modified Rankin (Stroke Patients Only)       Balance Overall balance assessment: Needs assistance Sitting-balance support: No upper  extremity supported;Feet supported Sitting balance-Leahy Scale: Good     Standing balance support: Bilateral upper extremity supported Standing balance-Leahy Scale: Fair                              Cognition Arousal/Alertness: Awake/alert Behavior During Therapy: WFL for tasks assessed/performed Overall Cognitive Status: Within Functional Limits for tasks assessed                                        Exercises Total Joint Exercises Goniometric ROM: 5-84 Other Exercises Other Exercises: ankle pumps, quad sets, SLR, heel slides and ab/add x 10    General Comments        Pertinent Vitals/Pain Pain Assessment: Faces Faces Pain Scale: Hurts little more Pain Location: R knee Pain Descriptors / Indicators: Aching;Grimacing Pain Intervention(s): Limited activity within patient's tolerance;Monitored during session;Premedicated before session    Home Living                      Prior Function            PT Goals (current goals can now be found in the care plan section) Progress towards PT goals: Progressing toward goals    Frequency    BID      PT Plan      Co-evaluation  AM-PAC PT "6 Clicks" Mobility   Outcome Measure  Help needed turning from your back to your side while in a flat bed without using bedrails?: None Help needed moving from lying on your back to sitting on the side of a flat bed without using bedrails?: A Little Help needed moving to and from a bed to a chair (including a wheelchair)?: A Little Help needed standing up from a chair using your arms (e.g., wheelchair or bedside chair)?: A Little Help needed to walk in hospital room?: A Little Help needed climbing 3-5 steps with a railing? : A Little 6 Click Score: 19    End of Session Equipment Utilized During Treatment: Gait belt Activity Tolerance: Patient tolerated treatment well Patient left: with call bell/phone within reach;in  bed;with bed alarm set Nurse Communication: Mobility status Pain - Right/Left: Right Pain - part of body: Knee     Time: 2683-4196 PT Time Calculation (min) (ACUTE ONLY): 38 min  Charges:  $Gait Training: 8-22 mins $Therapeutic Exercise: 8-22 mins $Therapeutic Activity: 8-22 mins                    Chesley Noon, PTA 12/03/19, 12:56 PM

## 2019-12-03 NOTE — Discharge Instructions (Signed)

## 2019-12-04 DIAGNOSIS — Z7901 Long term (current) use of anticoagulants: Secondary | ICD-10-CM | POA: Diagnosis not present

## 2019-12-04 DIAGNOSIS — E538 Deficiency of other specified B group vitamins: Secondary | ICD-10-CM | POA: Diagnosis not present

## 2019-12-04 DIAGNOSIS — Z96651 Presence of right artificial knee joint: Secondary | ICD-10-CM | POA: Diagnosis not present

## 2019-12-04 DIAGNOSIS — Z8673 Personal history of transient ischemic attack (TIA), and cerebral infarction without residual deficits: Secondary | ICD-10-CM | POA: Diagnosis not present

## 2019-12-04 DIAGNOSIS — K219 Gastro-esophageal reflux disease without esophagitis: Secondary | ICD-10-CM | POA: Diagnosis not present

## 2019-12-04 DIAGNOSIS — E785 Hyperlipidemia, unspecified: Secondary | ICD-10-CM | POA: Diagnosis not present

## 2019-12-04 DIAGNOSIS — I10 Essential (primary) hypertension: Secondary | ICD-10-CM | POA: Diagnosis not present

## 2019-12-04 DIAGNOSIS — Z471 Aftercare following joint replacement surgery: Secondary | ICD-10-CM | POA: Diagnosis not present

## 2019-12-04 DIAGNOSIS — Z86010 Personal history of colonic polyps: Secondary | ICD-10-CM | POA: Diagnosis not present

## 2019-12-07 ENCOUNTER — Ambulatory Visit: Payer: Self-pay | Admitting: *Deleted

## 2019-12-07 NOTE — Telephone Encounter (Signed)
Patient reporting she took extra dose of Losartan 50 mg tab last night accidentally. B/p now 142/80's. Experiencing some mild lightheadedness this morning but contributes it to also taking pain medication which makes her feel lightheaded. Denies any symptoms not related to the effects of the pain medication. Encouraged patient to increase water intake today call back with concerns.   Reason for Disposition . [1] DOUBLE DOSE (an extra dose or lesser amount) of prescription drug AND [2] NO symptoms (Exception: a double dose of antibiotics)  Answer Assessment - Initial Assessment Questions 1. NAME of MEDICATION: "What medicine are you calling about?"     Losartan 50 mg tabs 2. QUESTION: "What is your question?" (e.g., medication refill, side effect)     Took an additional dose accidentally last night. Thought it was my Atorvastatin.  3. PRESCRIBING HCP: "Who prescribed it?" Reason: if prescribed by specialist, call should be referred to that group.     na 4. SYMPTOMS: "Do you have any symptoms?"     Patient has experienced some mild lightheadedness this morning however it is not a new symtom as she is on pain medication post op and took that medication earlier this morning.  5. SEVERITY: If symptoms are present, ask "Are they mild, moderate or severe?"     No other symptoms other than listed above.  6. PREGNANCY:  "Is there any chance that you are pregnant?" "When was your last menstrual period?"     na  Protocols used: MEDICATION QUESTION CALL-A-AH

## 2019-12-14 ENCOUNTER — Other Ambulatory Visit: Payer: Self-pay

## 2019-12-14 ENCOUNTER — Telehealth: Payer: Self-pay | Admitting: Family Medicine

## 2019-12-14 DIAGNOSIS — I10 Essential (primary) hypertension: Secondary | ICD-10-CM

## 2019-12-14 MED ORDER — LOSARTAN POTASSIUM 50 MG PO TABS: ORAL_TABLET | ORAL | 0 refills | Status: DC

## 2019-12-14 NOTE — Telephone Encounter (Signed)
Copied from [place] [id number]. Topic: General - Other >> Dec 14, 2019  8:56 AM Keene Breath wrote: Reason for CRM: Patient would like Baxter Flattery to call her regarding her appt. For tomorrow morning.  Did not want to elaborate any further.  CB# [phone]

## 2019-12-14 NOTE — Telephone Encounter (Signed)
Sent in 30 days only- needs to sched her appt for within 30 days

## 2019-12-15 ENCOUNTER — Ambulatory Visit: Payer: PPO | Admitting: Family Medicine

## 2019-12-15 DIAGNOSIS — M25561 Pain in right knee: Secondary | ICD-10-CM | POA: Diagnosis not present

## 2019-12-15 DIAGNOSIS — Z96651 Presence of right artificial knee joint: Secondary | ICD-10-CM | POA: Diagnosis not present

## 2019-12-15 DIAGNOSIS — M25661 Stiffness of right knee, not elsewhere classified: Secondary | ICD-10-CM | POA: Diagnosis not present

## 2019-12-15 DIAGNOSIS — M6281 Muscle weakness (generalized): Secondary | ICD-10-CM | POA: Diagnosis not present

## 2019-12-17 DIAGNOSIS — M25561 Pain in right knee: Secondary | ICD-10-CM | POA: Diagnosis not present

## 2019-12-17 DIAGNOSIS — Z96651 Presence of right artificial knee joint: Secondary | ICD-10-CM | POA: Diagnosis not present

## 2019-12-23 DIAGNOSIS — Z96651 Presence of right artificial knee joint: Secondary | ICD-10-CM | POA: Diagnosis not present

## 2019-12-23 DIAGNOSIS — M25561 Pain in right knee: Secondary | ICD-10-CM | POA: Diagnosis not present

## 2019-12-23 IMAGING — MG MM DIGITAL SCREENING BILAT W/ TOMO W/ CAD
8 of 15 series · 8 of 40 positions shown · non-contrast
Comparison: Previous exam(s).

CLINICAL DATA: Screening.

EXAM:
DIGITAL SCREENING BILATERAL MAMMOGRAM WITH TOMO AND CAD

[R MLO synth-2D (1 of 2)]
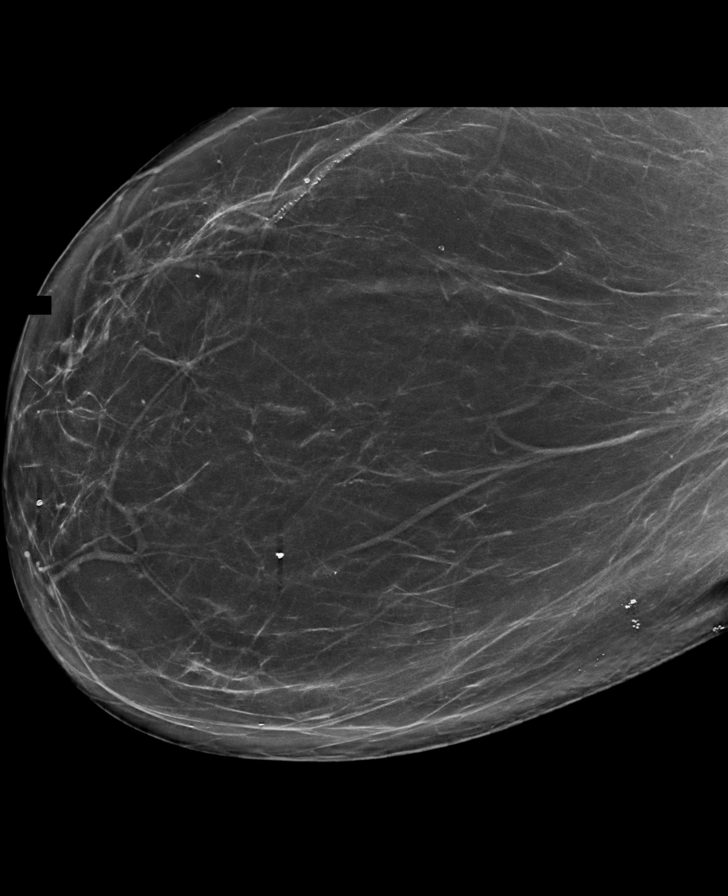

[R CC synth-2D]
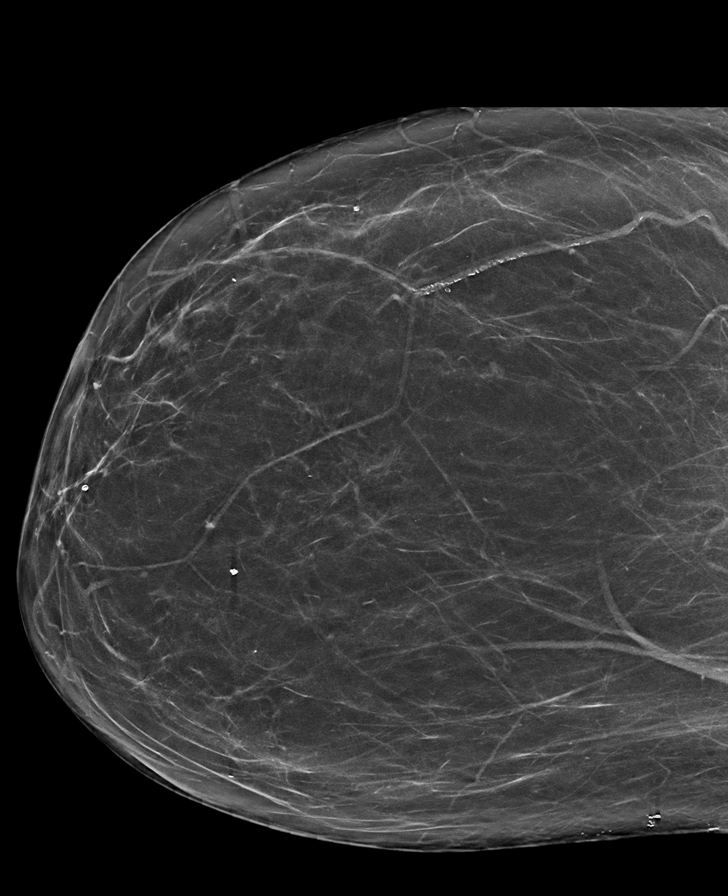

[L MLO synth-2D (1 of 3)]
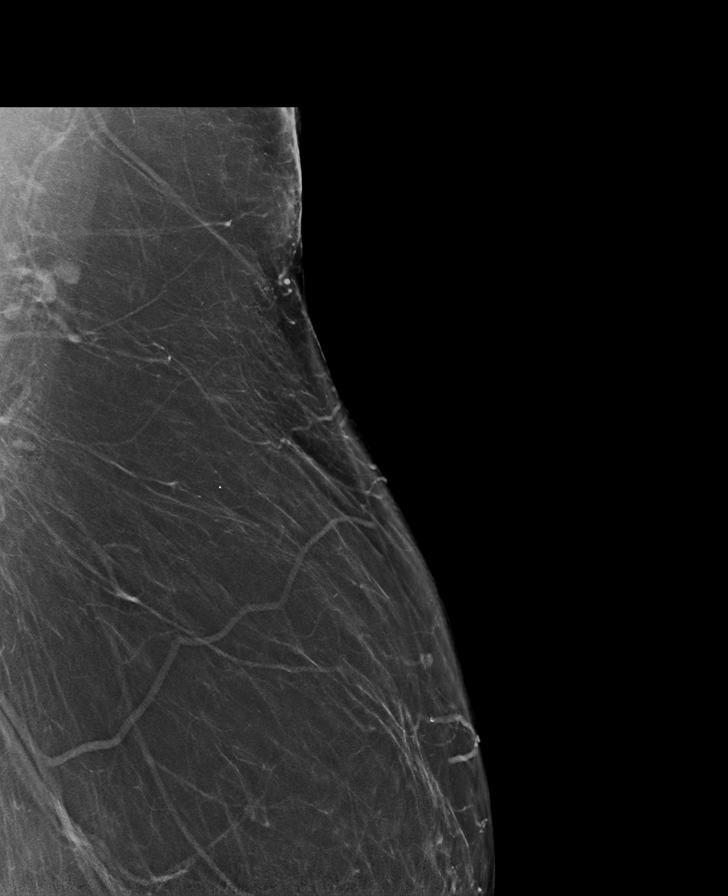

[R MLO synth-2D (2 of 2)]
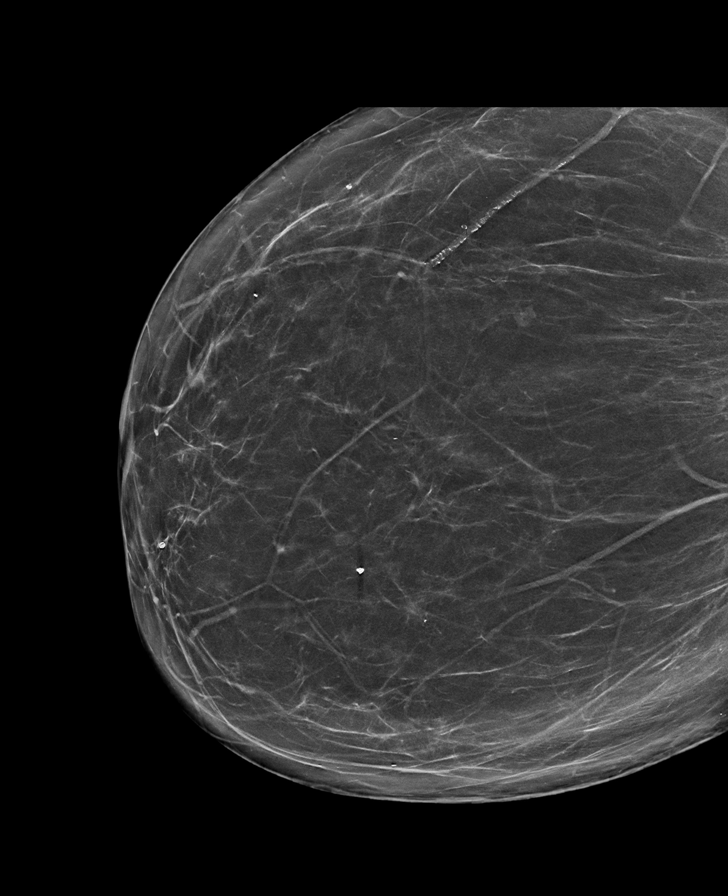

[L MLO synth-2D (2 of 3)]
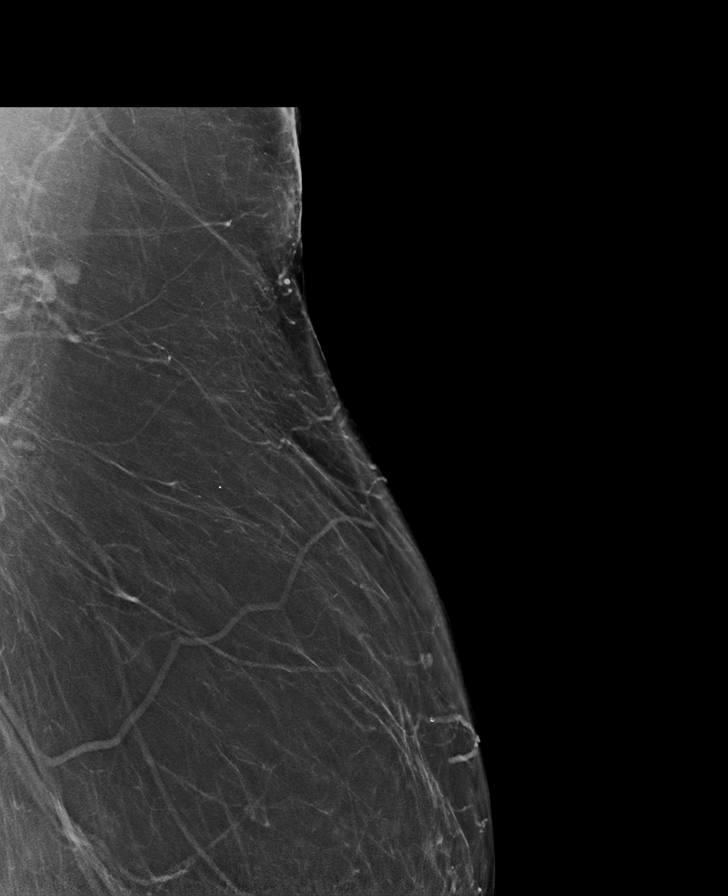

[L CC synth-2D]
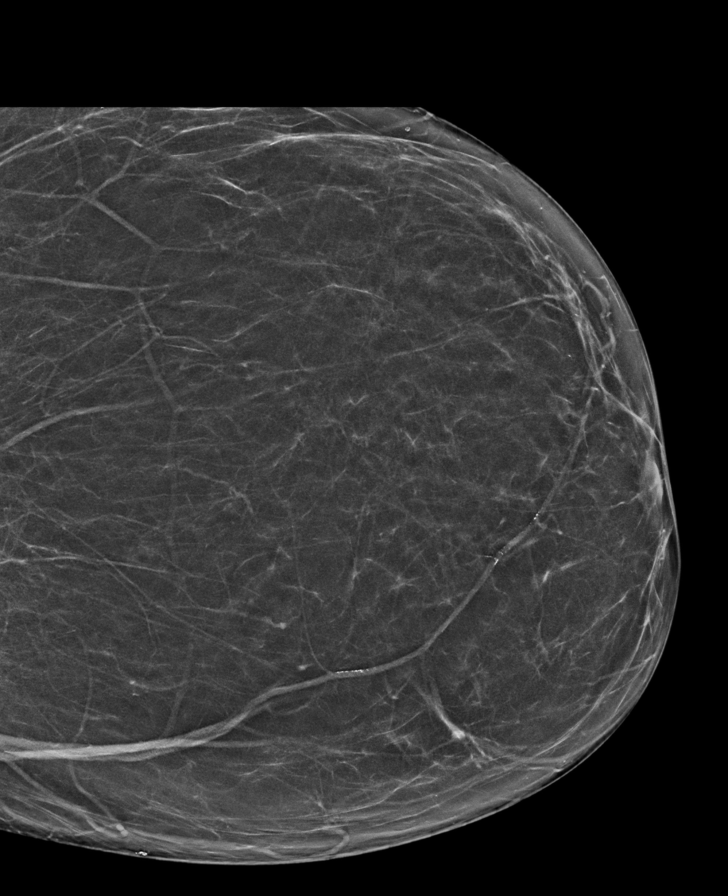

[L MLO synth-2D (3 of 3)]
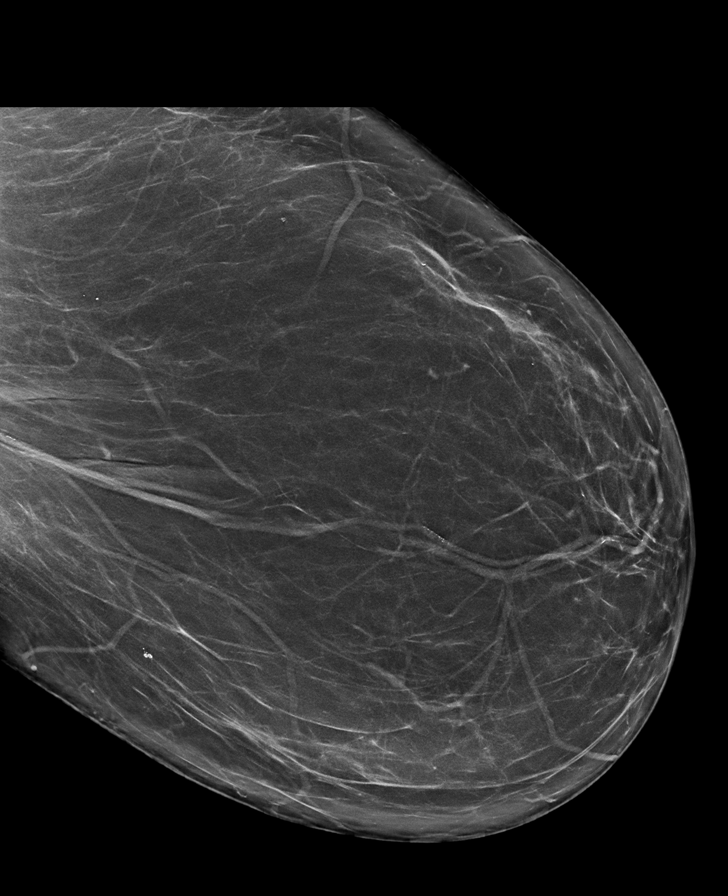

[L MLO tomo · tomo slice 66/96.0]
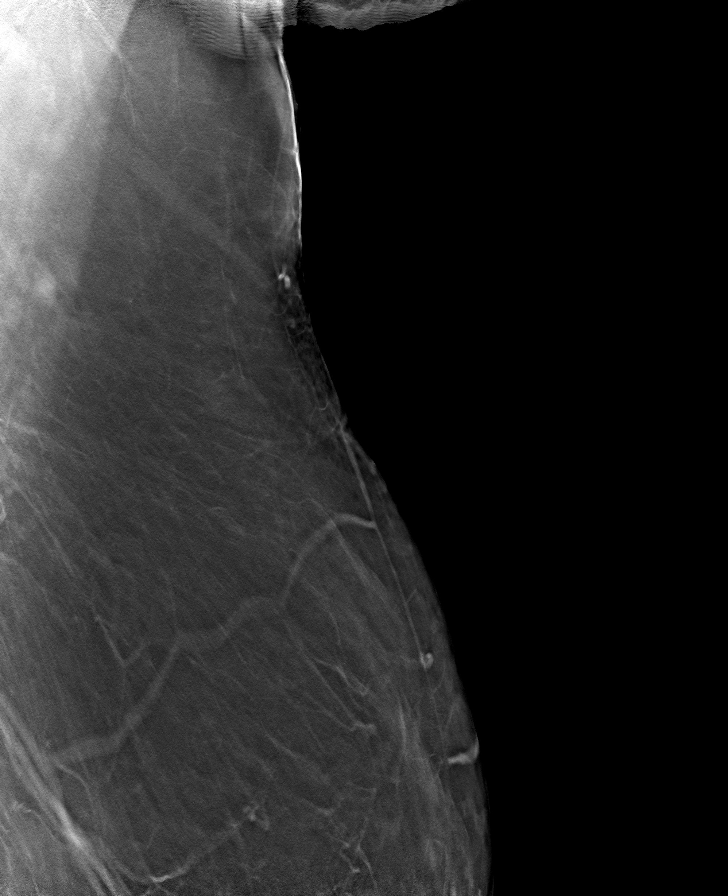

[8 of 40 positions shown; findings below may reference images not displayed]

ACR Breast Density Category b: There are scattered areas of
fibroglandular density.
FINDINGS: There are no findings suspicious for malignancy. Images were
processed with CAD.
IMPRESSION: No mammographic evidence of malignancy. A result letter of this
screening mammogram will be mailed directly to the patient.

RECOMMENDATION:
Screening mammogram in one year. (Code:[id number])

BI-RADS CATEGORY  1: Negative.

## 2019-12-25 DIAGNOSIS — M25561 Pain in right knee: Secondary | ICD-10-CM | POA: Diagnosis not present

## 2019-12-25 DIAGNOSIS — Z96651 Presence of right artificial knee joint: Secondary | ICD-10-CM | POA: Diagnosis not present

## 2019-12-28 DIAGNOSIS — M6281 Muscle weakness (generalized): Secondary | ICD-10-CM | POA: Diagnosis not present

## 2019-12-28 DIAGNOSIS — M25661 Stiffness of right knee, not elsewhere classified: Secondary | ICD-10-CM | POA: Diagnosis not present

## 2019-12-28 DIAGNOSIS — Z96651 Presence of right artificial knee joint: Secondary | ICD-10-CM | POA: Diagnosis not present

## 2019-12-28 DIAGNOSIS — M25561 Pain in right knee: Secondary | ICD-10-CM | POA: Diagnosis not present

## 2019-12-30 DIAGNOSIS — M25561 Pain in right knee: Secondary | ICD-10-CM | POA: Diagnosis not present

## 2019-12-30 DIAGNOSIS — M6281 Muscle weakness (generalized): Secondary | ICD-10-CM | POA: Diagnosis not present

## 2019-12-30 DIAGNOSIS — M25661 Stiffness of right knee, not elsewhere classified: Secondary | ICD-10-CM | POA: Diagnosis not present

## 2019-12-30 DIAGNOSIS — Z96651 Presence of right artificial knee joint: Secondary | ICD-10-CM | POA: Diagnosis not present

## 2020-01-01 ENCOUNTER — Other Ambulatory Visit: Payer: Self-pay

## 2020-01-01 ENCOUNTER — Encounter: Payer: Self-pay | Admitting: Family Medicine

## 2020-01-01 ENCOUNTER — Ambulatory Visit (INDEPENDENT_AMBULATORY_CARE_PROVIDER_SITE_OTHER): Payer: PPO | Admitting: Family Medicine

## 2020-01-01 VITALS — BP 130/80 | HR 88 | Ht 63.0 in | Wt 209.0 lb

## 2020-01-01 DIAGNOSIS — I6523 Occlusion and stenosis of bilateral carotid arteries: Secondary | ICD-10-CM

## 2020-01-01 DIAGNOSIS — K219 Gastro-esophageal reflux disease without esophagitis: Secondary | ICD-10-CM

## 2020-01-01 DIAGNOSIS — E785 Hyperlipidemia, unspecified: Secondary | ICD-10-CM

## 2020-01-01 DIAGNOSIS — I679 Cerebrovascular disease, unspecified: Secondary | ICD-10-CM

## 2020-01-01 DIAGNOSIS — I10 Essential (primary) hypertension: Secondary | ICD-10-CM

## 2020-01-01 MED ORDER — CLOPIDOGREL BISULFATE 75 MG PO TABS: 75.0000 mg | ORAL_TABLET | Freq: Every day | ORAL | 1 refills | Status: DC

## 2020-01-01 MED ORDER — SIMVASTATIN 20 MG PO TABS: 20.0000 mg | ORAL_TABLET | Freq: Every day | ORAL | 1 refills | Status: DC

## 2020-01-01 MED ORDER — PANTOPRAZOLE SODIUM 40 MG PO TBEC: 40.0000 mg | DELAYED_RELEASE_TABLET | Freq: Every day | ORAL | 1 refills | Status: DC

## 2020-01-01 MED ORDER — LOSARTAN POTASSIUM 50 MG PO TABS: ORAL_TABLET | ORAL | 1 refills | Status: DC

## 2020-01-01 NOTE — Progress Notes (Signed)
Date:  01/01/2020   Name:  Ellen Baker   DOB:  [birth year]   MRN:  [id number]   Chief Complaint: Hyperlipidemia, Hypertension, and Cerebrovascular Accident  Hyperlipidemia This is a chronic problem. The current episode started more than 1 year ago. The problem is controlled. Recent lipid tests were reviewed and are normal. She has no history of chronic renal disease, diabetes, hypothyroidism, liver disease, obesity or nephrotic syndrome. There are no known factors aggravating her hyperlipidemia. Pertinent negatives include no chest pain, focal sensory loss, focal weakness, leg pain, myalgias or shortness of breath. Current antihyperlipidemic treatment includes statins. The current treatment provides mild improvement of lipids. There are no compliance problems.  Risk factors for coronary artery disease include dyslipidemia and hypertension.  Hypertension This is a chronic problem. The current episode started more than 1 year ago. The problem has been gradually worsening since onset. The problem is controlled. Pertinent negatives include no anxiety, blurred vision, chest pain, headaches, malaise/fatigue, neck pain, orthopnea, palpitations, peripheral edema, PND, shortness of breath or sweats. There are no associated agents to hypertension. There are no known risk factors for coronary artery disease. Past treatments include angiotensin blockers. The current treatment provides moderate improvement. There are no compliance problems.  There is no history of angina, kidney disease, CAD/MI, CVA, heart failure, left ventricular hypertrophy, PVD or retinopathy. There is no history of chronic renal disease, a hypertension causing med or renovascular disease.  Neurologic Problem The patient's pertinent negatives include no altered mental status, clumsiness, focal sensory loss, focal weakness, loss of balance, memory loss, near-syncope, slurred speech, syncope, visual change or weakness. The current episode  started more than 1 year ago. The neurological problem developed gradually. The problem has been gradually worsening since onset. There was no focality noted. Pertinent negatives include no abdominal pain, auditory change, aura, back pain, bladder incontinence, bowel incontinence, chest pain, confusion, diaphoresis, dizziness, fatigue, fever, headaches, light-headedness, nausea, neck pain, palpitations, shortness of breath, vertigo or vomiting. The treatment provided mild relief. There is no history of a bleeding disorder, a clotting disorder, a CVA, dementia, head trauma, liver disease, mood changes or seizures.  Gastroesophageal Reflux She reports no abdominal pain, no belching, no chest pain, no choking, no coughing, no dysphagia, no early satiety, no globus sensation, no heartburn, no hoarse voice, no nausea, no sore throat, no stridor, no tooth decay, no water brash or no wheezing. This is a chronic problem. The current episode started more than 1 year ago. The problem has been gradually improving. Pertinent negatives include no fatigue.    Lab Results  Component Value Date   CREATININE 0.64 12/03/2019   BUN 7 (L) 12/03/2019   NA 137 12/03/2019   K 3.9 12/03/2019   CL 99 12/03/2019   CO2 28 12/03/2019   Lab Results  Component Value Date   CHOL 188 12/10/2018   HDL 64 12/10/2018   LDLCALC 107 (H) 12/10/2018   TRIG 84 12/10/2018   CHOLHDL 2.6 12/05/2017   Lab Results  Component Value Date   TSH 1.757 07/21/2018   Lab Results  Component Value Date   HGBA1C 5.3 01/01/2013   Lab Results  Component Value Date   WBC 11.0 (H) 12/03/2019   HGB 11.0 (L) 12/03/2019   HCT 33.4 (L) 12/03/2019   MCV 93.3 12/03/2019   PLT 193 12/03/2019   Lab Results  Component Value Date   ALT 16 11/27/2019   AST 22 11/27/2019   ALKPHOS 67 11/27/2019  BILITOT 1.2 11/27/2019     Review of Systems  Constitutional: Negative.  Negative for chills, diaphoresis, fatigue, fever, malaise/fatigue and  unexpected weight change.  HENT: Negative for congestion, ear discharge, ear pain, hoarse voice, rhinorrhea, sinus pressure, sneezing and sore throat.   Eyes: Negative for blurred vision, photophobia, pain, discharge, redness and itching.  Respiratory: Negative for cough, choking, shortness of breath, wheezing and stridor.   Cardiovascular: Negative for chest pain, palpitations, orthopnea, PND and near-syncope.  Gastrointestinal: Negative for abdominal pain, blood in stool, bowel incontinence, constipation, diarrhea, dysphagia, heartburn, nausea and vomiting.  Endocrine: Negative for cold intolerance, heat intolerance, polydipsia, polyphagia and polyuria.  Genitourinary: Negative for bladder incontinence, dysuria, flank pain, frequency, hematuria, menstrual problem, pelvic pain, urgency, vaginal bleeding and vaginal discharge.  Musculoskeletal: Negative for arthralgias, back pain, myalgias and neck pain.  Skin: Negative for rash.  Allergic/Immunologic: Negative for environmental allergies and food allergies.  Neurological: Negative for dizziness, vertigo, focal weakness, syncope, weakness, light-headedness, numbness, headaches and loss of balance.  Hematological: Negative for adenopathy. Does not bruise/bleed easily.  Psychiatric/Behavioral: Negative for confusion, dysphoric mood and memory loss. The patient is not nervous/anxious.     Patient Active Problem List   Diagnosis Date Noted  . Status post total knee replacement using cement, right 12/01/2019  . B12 deficiency 09/24/2018  . Normocytic anemia 07/20/2018  . Cerebral vascular insufficiency 06/06/2015  . Dyslipidemia 06/06/2015  . Aftercare following surgery of the circulatory system, [place] 08/03/2013  . Occlusion and stenosis of carotid artery without mention of cerebral infarction 01/16/2013  . Pre-operative cardiovascular examination 01/16/2013  . Carotid artery stenosis, symptomatic 01/02/2013  . CVA (cerebral infarction)  12/31/2012  . HTN (hypertension) 12/31/2012  . GERD (gastroesophageal reflux disease) 12/31/2012    Allergies  Allergen Reactions  . Adhesive [Tape] Rash    Skin became RAW    Past Surgical History:  Procedure Laterality Date  . CAROTID ENDARTERECTOMY     RIGHT  01/19/13  . COLONOSCOPY  08-18-12  . COLONOSCOPY WITH PROPOFOL N/A 03/10/2018   Procedure: COLONOSCOPY WITH PROPOFOL;  Surgeon: Lollie Sails, MD;  Location: [facility] ENDOSCOPY;  Service: Endoscopy;  Laterality: N/A;  . DILATION AND CURETTAGE OF UTERUS    . ENDARTERECTOMY Right 01/19/2013   Procedure: ENDARTERECTOMY CAROTID with patch angioplasty;  Surgeon: Conrad [place], MD;  Location: [place];  Service: Vascular;  Laterality: Right;  . ESOPHAGEAL DILATION    . JOINT REPLACEMENT Left 04/15/2017  . TOTAL KNEE ARTHROPLASTY Right 12/01/2019   Procedure: TOTAL KNEE ARTHROPLASTY - RNFA;  Surgeon: Corky Mull, MD;  Location: [facility] ORS;  Service: Orthopedics;  Laterality: Right;  . TUBAL LIGATION    . UPPER GI ENDOSCOPY  08-18-12    Social History   Tobacco Use  . Smoking status: Never Smoker  . Smokeless tobacco: Never Used  . Tobacco comment: Smoking cessation materials not required  Vaping Use  . Vaping Use: Never used  Substance Use Topics  . Alcohol use: No    Alcohol/week: 0.0 standard drinks  . Drug use: No     Medication list has been reviewed and updated.  Current Meds  Medication Sig  . clopidogrel (PLAVIX) 75 MG tablet Take 75 mg by mouth daily.  . Iron-Vitamin C 65-125 MG TABS Take 1 tablet by mouth 2 (two) times daily.  Marland Kitchen loratadine (CLARITIN) 10 MG tablet Take 1 tablet (10 mg total) by mouth continuous as needed for allergies.  Marland Kitchen losartan (COZAAR) 50 MG tablet TAKE  1 TABLET BY MOUTH DAILY  . meclizine (ANTIVERT) 25 MG tablet Take 1 tablet (25 mg total) by mouth 3 (three) times daily as needed for dizziness.  . Multiple Vitamins-Minerals (CENTRUM SILVER ULTRA WOMENS) TABS Take 1 tablet by mouth daily.  Marland Kitchen  omeprazole (PRILOSEC) 20 MG capsule Take 20 mg by mouth 3 (three) times a week.  Marland Kitchen oxyCODONE (OXY IR/ROXICODONE) 5 MG immediate release tablet Take 1-2 tablets (5-10 mg total) by mouth every 4 (four) hours as needed for moderate pain (pain score 4-6).  Marland Kitchen simvastatin (ZOCOR) 20 MG tablet Take 1 tablet (20 mg total) by mouth daily.  . traMADol (ULTRAM) 50 MG tablet Take 1 tablet (50 mg total) by mouth every 6 (six) hours as needed for moderate pain.  . vitamin B-12 (CYANOCOBALAMIN) 1000 MCG tablet Take 1,000 mcg by mouth daily.  . [DISCONTINUED] omeprazole (PRILOSEC) 10 MG capsule Take 1 capsule (10 mg total) by mouth daily. Patient is not sure of doseage- 20mg  qday otc    PHQ 2/9 Scores 01/01/2020 06/16/2019 06/06/2018 12/09/2017  PHQ - 2 Score 0 0 0 0  PHQ- 9 Score 4 0 0 0    GAD 7 : Generalized Anxiety Score 01/01/2020 06/16/2019  Nervous, Anxious, on Edge 0 0  Control/stop worrying 0 0  Worry too much - different things 0 0  Trouble relaxing 0 0  Restless 0 0  Easily annoyed or irritable 0 0  Afraid - awful might happen 0 0  Total GAD 7 Score 0 0    BP Readings from Last 3 Encounters:  01/01/20 130/80  12/03/19 (!) 159/66  10/06/19 (!) 153/96    Physical Exam Vitals and nursing note reviewed.  Constitutional:      Appearance: She is well-developed.  HENT:     Head: Normocephalic.     Right Ear: Tympanic membrane, ear canal and external ear normal.     Left Ear: Tympanic membrane, ear canal and external ear normal.     Nose: Nose normal.     Mouth/Throat:     Mouth: Mucous membranes are moist.  Eyes:     General: Lids are everted, no foreign bodies appreciated. No scleral icterus.       Left eye: No foreign body or hordeolum.     Conjunctiva/sclera: Conjunctivae normal.     Right eye: Right conjunctiva is not injected.     Left eye: Left conjunctiva is not injected.     Pupils: Pupils are equal, round, and reactive to light.  Neck:     Thyroid: No thyromegaly.      Vascular: No JVD.     Trachea: No tracheal deviation.  Cardiovascular:     Rate and Rhythm: Normal rate and regular rhythm.     Heart sounds: Normal heart sounds. No murmur heard.  No friction rub. No gallop.   Pulmonary:     Effort: Pulmonary effort is normal. No respiratory distress.     Breath sounds: Normal breath sounds. No wheezing or rales.  Abdominal:     General: Bowel sounds are normal.     Palpations: Abdomen is soft. There is no mass.     Tenderness: There is no abdominal tenderness. There is no guarding or rebound.  Musculoskeletal:        General: No tenderness. Normal range of motion.     Cervical back: Normal range of motion and neck supple.  Lymphadenopathy:     Cervical: No cervical adenopathy.  Skin:    General:  Skin is warm.     Capillary Refill: Capillary refill takes less than 2 seconds.     Findings: No rash.  Neurological:     Mental Status: She is alert and oriented to person, place, and time.     Cranial Nerves: No cranial nerve deficit.     Deep Tendon Reflexes: Reflexes normal.  Psychiatric:        Mood and Affect: Mood is not anxious or depressed.     Wt Readings from Last 3 Encounters:  01/01/20 209 lb (94.8 kg)  12/01/19 220 lb (99.8 kg)  11/25/19 220 lb (99.8 kg)    BP 130/80   Pulse 88   Ht 5\' 3"  (1.6 m)   Wt 209 lb (94.8 kg)   BMI 37.02 kg/m   Assessment and Plan:  1. Essential hypertension Chronic.  Controlled.  Stable.  Continue losartan 50 mg once a day. - losartan (COZAAR) 50 MG tablet; TAKE 1 TABLET BY MOUTH DAILY  Dispense: 90 tablet; Refill: 1  2. Dyslipidemia Chronic.  Controlled.  Stable.  Continue simvastatin 20 mg once a day and will check lipid panel for current control status. - simvastatin (ZOCOR) 20 MG tablet; Take 1 tablet (20 mg total) by mouth daily.  Dispense: 90 tablet; Refill: 1 - Lipid Panel With LDL/HDL Ratio  3. Cerebral vascular insufficiency Chronic.  Controlled.  Stable.  Patient is currently stable  as far as no cerebrovascular concerns.  Patient desires to have evaluations closer to then the [facility] location.  She is asked for referral to Dr. Lazaro Arms at [facility] vein and vascular. - Ambulatory referral to Vascular Surgery  4. Gastroesophageal reflux disease, unspecified whether esophagitis present Chronic.  Controlled.  Stable.  Is not quite controlled with the onset.  We will initiate pantoprazole 40 mg once a day. - pantoprazole (PROTONIX) 40 MG tablet; Take 1 tablet (40 mg total) by mouth daily.  Dispense: 90 tablet; Refill: 1  5. Carotid atherosclerosis, bilateral Chronic.  Controlled.  Stable.  Annual surveillance has been done in [place].  Patient would like to have a closer evaluation center for her carotid surveillance.  We will refer to [facility] upon request. - Ambulatory referral to Vascular Surgery

## 2020-01-02 LAB — LIPID PANEL WITH LDL/HDL RATIO
Cholesterol, Total: 168 mg/dL (ref 100–199)
HDL: 63 mg/dL (ref >39)
LDL Chol Calc (NIH): 86 mg/dL (ref 0–99)
LDL/HDL Ratio: 1.4 ratio (ref 0.0–3.2)
Triglycerides: 106 mg/dL (ref 0–149)
VLDL Cholesterol Cal: 19 mg/dL (ref 5–40)

## 2020-01-04 ENCOUNTER — Other Ambulatory Visit: Payer: Self-pay

## 2020-01-04 ENCOUNTER — Inpatient Hospital Stay: Payer: PPO | Attending: Hematology and Oncology

## 2020-01-04 DIAGNOSIS — E538 Deficiency of other specified B group vitamins: Secondary | ICD-10-CM | POA: Diagnosis not present

## 2020-01-04 DIAGNOSIS — D509 Iron deficiency anemia, unspecified: Secondary | ICD-10-CM | POA: Diagnosis not present

## 2020-01-04 DIAGNOSIS — Z79899 Other long term (current) drug therapy: Secondary | ICD-10-CM | POA: Diagnosis not present

## 2020-01-04 DIAGNOSIS — D649 Anemia, unspecified: Secondary | ICD-10-CM

## 2020-01-04 LAB — CBC
HCT: 38.6 % (ref 36.0–46.0)
Hemoglobin: 12.4 g/dL (ref 12.0–15.0)
MCH: 29.7 pg (ref 26.0–34.0)
MCHC: 32.1 g/dL (ref 30.0–36.0)
MCV: 92.6 fL (ref 80.0–100.0)
Platelets: 224 10*3/uL (ref 150–400)
RBC: 4.17 MIL/uL (ref 3.87–5.11)
RDW: 12.8 % (ref 11.5–15.5)
WBC: 3.9 10*3/uL — ABNORMAL LOW (ref 4.0–10.5)
nRBC: 0 % (ref 0.0–0.2)

## 2020-01-04 LAB — FERRITIN: Ferritin: 74 ng/mL (ref 11–307)

## 2020-01-05 DIAGNOSIS — M25561 Pain in right knee: Secondary | ICD-10-CM | POA: Diagnosis not present

## 2020-01-05 DIAGNOSIS — M25661 Stiffness of right knee, not elsewhere classified: Secondary | ICD-10-CM | POA: Diagnosis not present

## 2020-01-05 DIAGNOSIS — Z96651 Presence of right artificial knee joint: Secondary | ICD-10-CM | POA: Diagnosis not present

## 2020-01-05 DIAGNOSIS — M6281 Muscle weakness (generalized): Secondary | ICD-10-CM | POA: Diagnosis not present

## 2020-01-07 DIAGNOSIS — M25561 Pain in right knee: Secondary | ICD-10-CM | POA: Diagnosis not present

## 2020-01-07 DIAGNOSIS — M25661 Stiffness of right knee, not elsewhere classified: Secondary | ICD-10-CM | POA: Diagnosis not present

## 2020-01-07 DIAGNOSIS — M6281 Muscle weakness (generalized): Secondary | ICD-10-CM | POA: Diagnosis not present

## 2020-01-07 DIAGNOSIS — Z96651 Presence of right artificial knee joint: Secondary | ICD-10-CM | POA: Diagnosis not present

## 2020-01-13 DIAGNOSIS — M6281 Muscle weakness (generalized): Secondary | ICD-10-CM | POA: Diagnosis not present

## 2020-01-13 DIAGNOSIS — M1711 Unilateral primary osteoarthritis, right knee: Secondary | ICD-10-CM | POA: Diagnosis not present

## 2020-01-13 DIAGNOSIS — M25661 Stiffness of right knee, not elsewhere classified: Secondary | ICD-10-CM | POA: Diagnosis not present

## 2020-01-13 DIAGNOSIS — M25561 Pain in right knee: Secondary | ICD-10-CM | POA: Diagnosis not present

## 2020-01-13 DIAGNOSIS — Z96651 Presence of right artificial knee joint: Secondary | ICD-10-CM | POA: Diagnosis not present

## 2020-01-14 DIAGNOSIS — M25561 Pain in right knee: Secondary | ICD-10-CM | POA: Diagnosis not present

## 2020-01-14 DIAGNOSIS — Z96651 Presence of right artificial knee joint: Secondary | ICD-10-CM | POA: Diagnosis not present

## 2020-01-14 DIAGNOSIS — M25661 Stiffness of right knee, not elsewhere classified: Secondary | ICD-10-CM | POA: Diagnosis not present

## 2020-01-14 DIAGNOSIS — M6281 Muscle weakness (generalized): Secondary | ICD-10-CM | POA: Diagnosis not present

## 2020-01-19 DIAGNOSIS — Z96651 Presence of right artificial knee joint: Secondary | ICD-10-CM | POA: Diagnosis not present

## 2020-01-19 DIAGNOSIS — M25661 Stiffness of right knee, not elsewhere classified: Secondary | ICD-10-CM | POA: Diagnosis not present

## 2020-01-19 DIAGNOSIS — M6281 Muscle weakness (generalized): Secondary | ICD-10-CM | POA: Diagnosis not present

## 2020-01-19 DIAGNOSIS — M25561 Pain in right knee: Secondary | ICD-10-CM | POA: Diagnosis not present

## 2020-01-21 DIAGNOSIS — Z96651 Presence of right artificial knee joint: Secondary | ICD-10-CM | POA: Diagnosis not present

## 2020-01-21 DIAGNOSIS — M6281 Muscle weakness (generalized): Secondary | ICD-10-CM | POA: Diagnosis not present

## 2020-01-21 DIAGNOSIS — M25561 Pain in right knee: Secondary | ICD-10-CM | POA: Diagnosis not present

## 2020-01-21 DIAGNOSIS — M25661 Stiffness of right knee, not elsewhere classified: Secondary | ICD-10-CM | POA: Diagnosis not present

## 2020-01-26 DIAGNOSIS — Z96651 Presence of right artificial knee joint: Secondary | ICD-10-CM | POA: Diagnosis not present

## 2020-01-26 DIAGNOSIS — M25661 Stiffness of right knee, not elsewhere classified: Secondary | ICD-10-CM | POA: Diagnosis not present

## 2020-01-26 DIAGNOSIS — M25561 Pain in right knee: Secondary | ICD-10-CM | POA: Diagnosis not present

## 2020-01-26 DIAGNOSIS — M6281 Muscle weakness (generalized): Secondary | ICD-10-CM | POA: Diagnosis not present

## 2020-01-28 DIAGNOSIS — Z96651 Presence of right artificial knee joint: Secondary | ICD-10-CM | POA: Diagnosis not present

## 2020-01-28 DIAGNOSIS — M25561 Pain in right knee: Secondary | ICD-10-CM | POA: Diagnosis not present

## 2020-01-28 DIAGNOSIS — M6281 Muscle weakness (generalized): Secondary | ICD-10-CM | POA: Diagnosis not present

## 2020-01-28 DIAGNOSIS — M25661 Stiffness of right knee, not elsewhere classified: Secondary | ICD-10-CM | POA: Diagnosis not present

## 2020-02-02 ENCOUNTER — Encounter (INDEPENDENT_AMBULATORY_CARE_PROVIDER_SITE_OTHER): Payer: Self-pay | Admitting: Vascular Surgery

## 2020-02-02 ENCOUNTER — Ambulatory Visit (INDEPENDENT_AMBULATORY_CARE_PROVIDER_SITE_OTHER): Payer: PPO | Admitting: Vascular Surgery

## 2020-02-02 ENCOUNTER — Other Ambulatory Visit: Payer: Self-pay

## 2020-02-02 VITALS — BP 187/83 | HR 62 | Ht 63.0 in | Wt 207.0 lb

## 2020-02-02 DIAGNOSIS — I6523 Occlusion and stenosis of bilateral carotid arteries: Secondary | ICD-10-CM | POA: Diagnosis not present

## 2020-02-02 DIAGNOSIS — Z96651 Presence of right artificial knee joint: Secondary | ICD-10-CM | POA: Diagnosis not present

## 2020-02-02 DIAGNOSIS — I10 Essential (primary) hypertension: Secondary | ICD-10-CM

## 2020-02-02 DIAGNOSIS — E785 Hyperlipidemia, unspecified: Secondary | ICD-10-CM

## 2020-02-02 DIAGNOSIS — M6281 Muscle weakness (generalized): Secondary | ICD-10-CM | POA: Diagnosis not present

## 2020-02-02 DIAGNOSIS — M25561 Pain in right knee: Secondary | ICD-10-CM | POA: Diagnosis not present

## 2020-02-02 DIAGNOSIS — M25661 Stiffness of right knee, not elsewhere classified: Secondary | ICD-10-CM | POA: Diagnosis not present

## 2020-02-02 NOTE — Assessment & Plan Note (Signed)
The patient has a previous history of carotid endarterectomy on the right and mild carotid disease on the left.  She has a remote history of stroke about 7 years ago prior to surgery, no obvious residual deficits.  At this point, it has been about 2 years since her carotids have been checked and I would recommend a carotid duplex for further evaluation of her carotids.  I discussed the pathophysiology and natural history of carotid disease.  I discussed the reason and rationale for continuing to monitor this as well as when we consider intervention.  She voices her understanding.  She will continue her Plavix and statin agent we will have her return in the near future with a carotid duplex.

## 2020-02-02 NOTE — Assessment & Plan Note (Signed)
lipid control important in reducing the progression of atherosclerotic disease. Continue statin therapy  

## 2020-02-02 NOTE — Assessment & Plan Note (Signed)
blood pressure control important in reducing the progression of atherosclerotic disease. On appropriate oral medications.  

## 2020-02-02 NOTE — Progress Notes (Signed)
Patient ID: Ellen Baker, female   DOB: 10/18/1950, 69 y.o.   MRN: [id number]  Chief Complaint  Patient presents with  . New Patient (Initial Visit)    establish care for closer locatiom carotid stenosis    HPI Ellen Baker is a 69 y.o. female.  I am asked to see the patient by Dr. Ronnald Ramp for evaluation of carotid stenosis.  The patient reports previously being seen by the group in [place] but this was a very long trip for her and one that she did not want to continue.  She was last seen about 2 years ago at which time her carotid duplex appeared to be patent by duplex and her left carotid artery had mild stenosis.  She has not been checked since that time.  She denies any focal neurologic symptoms. Specifically, the patient denies amaurosis fugax, speech or swallowing difficulties, or arm or leg weakness or numbness    Past Medical History:  Diagnosis Date  . Arthritis    right knee  . Carotid artery occlusion   . Dizziness   . GERD (gastroesophageal reflux disease)   . Heart murmur   . History of colon polyps   . Hyperlipidemia   . Hypertension   . Iron deficiency   . Stroke [facility]) 12-31-12   no residual effects    Past Surgical History:  Procedure Laterality Date  . CAROTID ENDARTERECTOMY     RIGHT  01/19/13  . COLONOSCOPY  08-18-12  . COLONOSCOPY WITH PROPOFOL N/A 03/10/2018   Procedure: COLONOSCOPY WITH PROPOFOL;  Surgeon: Lollie Sails, MD;  Location[facility] ENDOSCOPY;  Service: Endoscopy;  Laterality: N/A;  . DILATION AND CURETTAGE OF UTERUS    . ENDARTERECTOMY Right 01/19/2013   Procedure: ENDARTERECTOMY CAROTID with patch angioplasty;  Surgeon: Conrad [place], MD;  Location: [place];  Service: Vascular;  Laterality: Right;  . ESOPHAGEAL DILATION    . JOINT REPLACEMENT Left 04/15/2017  . TOTAL KNEE ARTHROPLASTY Right 12/01/2019   Procedure: TOTAL KNEE ARTHROPLASTY - RNFA;  Surgeon: Corky Mull, MD;  Location: [facility] ORS;  Service: Orthopedics;   Laterality: Right;  . TUBAL LIGATION    . UPPER GI ENDOSCOPY  08-18-12    Family History  Problem Relation Age of Onset  . Stroke Mother   . Deep vein thrombosis Mother   . Heart disease Mother        Amputation-  . Hypertension Mother   . Alzheimer's disease Mother   . Heart failure Father   . Heart disease Father        CHF  . Hypertension Father   . Varicose Veins Father   . Parkinson's disease Father   . Breast cancer Paternal Aunt 67     Social History   Tobacco Use  . Smoking status: Never Smoker  . Smokeless tobacco: Never Used  . Tobacco comment: Smoking cessation materials not required  Vaping Use  . Vaping Use: Never used  Substance Use Topics  . Alcohol use: No    Alcohol/week: 0.0 standard drinks  . Drug use: No    Allergies  Allergen Reactions  . Adhesive [Tape] Rash    Skin became RAW    Current Outpatient Medications  Medication Sig Dispense Refill  . clopidogrel (PLAVIX) 75 MG tablet Take 1 tablet (75 mg total) by mouth daily. 90 tablet 1  . Iron-Vitamin C 65-125 MG TABS Take 1 tablet by mouth 2 (two) times daily. 60 tablet 1  .  loratadine (CLARITIN) 10 MG tablet Take 1 tablet (10 mg total) by mouth continuous as needed for allergies. 30 tablet 6  . losartan (COZAAR) 50 MG tablet TAKE 1 TABLET BY MOUTH DAILY 90 tablet 1  . Multiple Vitamins-Minerals (CENTRUM SILVER ULTRA WOMENS) TABS Take 1 tablet by mouth daily.    . simvastatin (ZOCOR) 20 MG tablet Take 1 tablet (20 mg total) by mouth daily. 90 tablet 1  . vitamin B-12 (CYANOCOBALAMIN) 1000 MCG tablet Take 1,000 mcg by mouth daily.    . Calcium Carbonate-Vitamin D3 600-400 MG-UNIT TABS Take 1 tablet by mouth daily.    Marland Kitchen ibuprofen (ADVIL) 200 MG tablet Take by mouth.    . meclizine (ANTIVERT) 25 MG tablet Take 1 tablet (25 mg total) by mouth 3 (three) times daily as needed for dizziness. (Patient not taking: Reported on 02/02/2020) 30 tablet 0  . oxyCODONE (OXY IR/ROXICODONE) 5 MG immediate release  tablet Take 1-2 tablets (5-10 mg total) by mouth every 4 (four) hours as needed for moderate pain (pain score 4-6). (Patient not taking: Reported on 02/02/2020) 40 tablet 0  . pantoprazole (PROTONIX) 40 MG tablet Take 1 tablet (40 mg total) by mouth daily. (Patient not taking: Reported on 02/02/2020) 90 tablet 1  . traMADol (ULTRAM) 50 MG tablet Take 1 tablet (50 mg total) by mouth every 6 (six) hours as needed for moderate pain. (Patient not taking: Reported on 02/02/2020) 30 tablet 0   No current facility-administered medications for this visit.      REVIEW OF SYSTEMS (Negative unless checked)  Constitutional: [] Weight loss  [] Fever  [] Chills Cardiac: [] Chest pain   [] Chest pressure   [] Palpitations   [] Shortness of breath when laying flat   [] Shortness of breath at rest   [] Shortness of breath with exertion. Vascular:  [] Pain in legs with walking   [] Pain in legs at rest   [] Pain in legs when laying flat   [] Claudication   [] Pain in feet when walking  [] Pain in feet at rest  [] Pain in feet when laying flat   [] History of DVT   [] Phlebitis   [] Swelling in legs   [] Varicose veins   [] Non-healing ulcers Pulmonary:   [] Uses home oxygen   [] Productive cough   [] Hemoptysis   [] Wheeze  [] COPD   [] Asthma Neurologic:  [x] Dizziness  [] Blackouts   [] Seizures   [x] History of stroke   [] History of TIA  [] Aphasia   [] Temporary blindness   [] Dysphagia   [] Weakness or numbness in arms   [] Weakness or numbness in legs Musculoskeletal:  [x] Arthritis   [] Joint swelling   [] Joint pain   [] Low back pain Hematologic:  [] Easy bruising  [] Easy bleeding   [] Hypercoagulable state   [x] Anemic  [] Hepatitis Gastrointestinal:  [] Blood in stool   [] Vomiting blood  [x] Gastroesophageal reflux/heartburn   [] Abdominal pain Genitourinary:  [] Chronic kidney disease   [] Difficult urination  [] Frequent urination  [] Burning with urination   [] Hematuria Skin:  [] Rashes   [] Ulcers   [] Wounds Psychological:  [] History of anxiety   []   History of major depression.    Physical Exam BP (!) 187/83   Pulse 62   Ht 5\' 3"  (1.6 m)   Wt 207 lb (93.9 kg)   BMI 36.67 kg/m  Gen:  WD/WN, NAD Head: [place]/AT, No temporalis wasting. Ear/Nose/Throat: Hearing grossly intact, nares w/o erythema or drainage, oropharynx w/o Erythema/Exudate Eyes: Conjunctiva clear, sclera non-icteric  Neck: trachea midline.  No bruit or JVD.  Pulmonary:  Good air movement, clear to auscultation  bilaterally.  Cardiac: RRR, normal S1, S2, no Murmurs, rubs or gallops. Vascular:  Vessel Right Left  Radial Palpable Palpable                          PT Palpable Palpable  DP Palpable Palpable   Gastrointestinal: soft, non-tender/non-distended. Musculoskeletal: M/S 5/5 throughout.  Extremities without ischemic changes.  No deformity or atrophy. Trace LE edema. Neurologic: Sensation grossly intact in extremities.  Symmetrical.  Speech is fluent. Motor exam as listed above. Psychiatric: Judgment intact, Mood & affect appropriate for pt's clinical situation. Dermatologic: No rashes or ulcers noted.  No cellulitis or open wounds. Lymph : No Cervical, Axillary, or Inguinal lymphadenopathy.   Radiology No results found.  Labs Recent Results (from the past 2160 hour(s))  Surgical pcr screen     Status: None   Collection Time: 11/27/19 10:06 AM   Specimen: Nasal Mucosa; Nasal Swab  Result Value Ref Range   MRSA, PCR NEGATIVE NEGATIVE   Staphylococcus aureus NEGATIVE NEGATIVE    Comment: (NOTE) The Xpert SA Assay (FDA approved for NASAL specimens in patients 73 years of age and older), is one component of a comprehensive surveillance program. It is not intended to diagnose infection nor to guide or monitor treatment. Performed at [facility], [address] [place], [place] [address]   CBC WITH DIFFERENTIAL     Status: None   Collection Time: 11/27/19 10:07 AM  Result Value Ref Range   WBC 5.7 4.0 - 10.5 K/uL   RBC 4.04 3.87 - 5.11  MIL/uL   Hemoglobin 12.5 12.0 - 15.0 g/dL   HCT 37.6 36 - 46 %   MCV 93.1 80.0 - 100.0 fL   MCH 30.9 26.0 - 34.0 pg   MCHC 33.2 30.0 - 36.0 g/dL   RDW 12.7 11.5 - 15.5 %   Platelets 274 150 - 400 K/uL   nRBC 0.0 0.0 - 0.2 %   Neutrophils Relative % 61 %   Neutro Abs 3.5 1.7 - 7.7 K/uL   Lymphocytes Relative 26 %   Lymphs Abs 1.5 0.7 - 4.0 K/uL   Monocytes Relative 8 %   Monocytes Absolute 0.5 0 - 1 K/uL   Eosinophils Relative 4 %   Eosinophils Absolute 0.2 0 - 0 K/uL   Basophils Relative 1 %   Basophils Absolute 0.1 0 - 0 K/uL   Immature Granulocytes 0 %   Abs Immature Granulocytes 0.01 0.00 - 0.07 K/uL    Comment: Performed at [facility], [address] [place], [place] [address]  Comprehensive metabolic panel     Status: Abnormal   Collection Time: 11/27/19 10:07 AM  Result Value Ref Range   Sodium 141 135 - 145 mmol/L   Potassium 3.6 3.5 - 5.1 mmol/L   Chloride 103 98 - 111 mmol/L   CO2 29 22 - 32 mmol/L   Glucose, Bld 103 (H) 70 - 99 mg/dL    Comment: Glucose reference range applies only to samples taken after fasting for at least 8 hours.   BUN 10 8 - 23 mg/dL   Creatinine, Ser 0.69 0.44 - 1.00 mg/dL   Calcium 9.1 8.9 - 10.3 mg/dL   Total Protein 7.3 6.5 - 8.1 g/dL   Albumin 3.9 3.5 - 5.0 g/dL   AST 22 15 - 41 U/L   ALT 16 0 - 44 U/L   Alkaline Phosphatase 67 38 - 126 U/L   Total Bilirubin  1.2 0.3 - 1.2 mg/dL   GFR calc non Af Amer >60 >60 mL/min   GFR calc Af Amer >60 >60 mL/min   Anion gap 9 5 - 15    Comment: Performed at [facility], [address] [place], [place] [address]  Type and screen Order type and screen if day of surgery is less than 15 days from draw of preadmission visit or order morning of surgery if day of surgery is greater than 6 days from preadmission visit.     Status: None   Collection Time: 11/27/19 10:07 AM  Result Value Ref Range   ABO/RH(D) A POS    Antibody Screen NEG    Sample Expiration 12/11/2019,2359     Extend sample reason      NO TRANSFUSIONS OR PREGNANCY IN THE PAST 3 MONTHS Performed at [facility], [address] [place], [place] [address]   SARS CORONAVIRUS 2 (TAT 6-24 HRS) Nasopharyngeal Nasopharyngeal Swab     Status: None   Collection Time: 11/27/19 12:59 PM   Specimen: Nasopharyngeal Swab  Result Value Ref Range   SARS Coronavirus 2 NEGATIVE NEGATIVE    Comment: (NOTE) SARS-CoV-2 target nucleic acids are NOT DETECTED.  The SARS-CoV-2 RNA is generally detectable in upper and lower respiratory specimens during the acute phase of infection. Negative results do not preclude SARS-CoV-2 infection, do not rule out co-infections with other pathogens, and should not be used as the sole basis for treatment or other patient management decisions. Negative results must be combined with clinical observations, patient history, and epidemiological information. The expected result is Negative.  Fact Sheet for Patients: SugarRoll.be  Fact Sheet for Healthcare Providers: https://www.woods-mathews.com/  This test is not yet approved or cleared by the [place] FDA and  has been authorized for detection and/or diagnosis of SARS-CoV-2 by FDA under an Emergency Use Authorization (EUA). This EUA will remain  in effect (meaning this test can be used) for the duration of the COVID-19 declaration under Se ction 564(b)(1) of the Act, 21 U.S.C. section 360bbb-3(b)(1), unless the authorization is terminated or revoked sooner.  Performed at [facility], [place] [address] [place], [place] [address]   ABO/Rh     Status: None   Collection Time: 12/01/19  9:24 AM  Result Value Ref Range   ABO/RH(D)      A POS Performed at [facility], [address] [place], [place] [address]   Basic metabolic panel     Status: Abnormal   Collection Time: 12/02/19  4:39 AM  Result Value Ref Range   Sodium 140 135 - 145 mmol/L    Potassium 4.5 3.5 - 5.1 mmol/L   Chloride 105 98 - 111 mmol/L   CO2 28 22 - 32 mmol/L   Glucose, Bld 126 (H) 70 - 99 mg/dL    Comment: Glucose reference range applies only to samples taken after fasting for at least 8 hours.   BUN 9 8 - 23 mg/dL   Creatinine, Ser 0.72 0.44 - 1.00 mg/dL   Calcium 8.1 (L) 8.9 - 10.3 mg/dL   GFR calc non Af Amer >60 >60 mL/min   GFR calc Af Amer >60 >60 mL/min   Anion gap 7 5 - 15    Comment: Performed at [facility], [address] [place], [place] [address]  CBC     Status: Abnormal   Collection Time: 12/02/19  4:39 AM  Result Value Ref Range   WBC 8.1 4.0 -  10.5 K/uL   RBC 3.54 (L) 3.87 - 5.11 MIL/uL   Hemoglobin 10.9 (L) 12.0 - 15.0 g/dL   HCT 32.0 (L) 36 - 46 %   MCV 90.4 80.0 - 100.0 fL   MCH 30.8 26.0 - 34.0 pg   MCHC 34.1 30.0 - 36.0 g/dL   RDW 12.4 11.5 - 15.5 %   Platelets 196 150 - 400 K/uL   nRBC 0.0 0.0 - 0.2 %    Comment: Performed at [facility], [address] [place], [place] [address]  Basic metabolic panel     Status: Abnormal   Collection Time: 12/03/19  5:57 AM  Result Value Ref Range   Sodium 137 135 - 145 mmol/L   Potassium 3.9 3.5 - 5.1 mmol/L   Chloride 99 98 - 111 mmol/L   CO2 28 22 - 32 mmol/L   Glucose, Bld 119 (H) 70 - 99 mg/dL    Comment: Glucose reference range applies only to samples taken after fasting for at least 8 hours.   BUN 7 (L) 8 - 23 mg/dL   Creatinine, Ser 0.64 0.44 - 1.00 mg/dL   Calcium 8.4 (L) 8.9 - 10.3 mg/dL   GFR calc non Af Amer >60 >60 mL/min   GFR calc Af Amer >60 >60 mL/min   Anion gap 10 5 - 15    Comment: Performed at [facility], [address] [place], [place] [address]  CBC     Status: Abnormal   Collection Time: 12/03/19  5:57 AM  Result Value Ref Range   WBC 11.0 (H) 4.0 - 10.5 K/uL   RBC 3.58 (L) 3.87 - 5.11 MIL/uL   Hemoglobin 11.0 (L) 12.0 - 15.0 g/dL   HCT 33.4 (L) 36 - 46 %   MCV 93.3 80.0 - 100.0 fL   MCH 30.7 26.0 - 34.0 pg   MCHC  32.9 30.0 - 36.0 g/dL   RDW 12.4 11.5 - 15.5 %   Platelets 193 150 - 400 K/uL   nRBC 0.0 0.0 - 0.2 %    Comment: Performed at [facility], [address] [place], [place] [address]  Lipid Panel With LDL/HDL Ratio     Status: None   Collection Time: 01/01/20 11:29 AM  Result Value Ref Range   Cholesterol, Total 168 100 - 199 mg/dL   Triglycerides 106 0 - 149 mg/dL   HDL 63 >39 mg/dL   VLDL Cholesterol Cal 19 5 - 40 mg/dL   LDL Chol Calc (NIH) 86 0 - 99 mg/dL   LDL/HDL Ratio 1.4 0.0 - 3.2 ratio    Comment:                                     LDL/HDL Ratio                                             Men  Women                               1/2 Avg.Risk  1.0    1.5  Avg.Risk  3.6    3.2                                2X Avg.Risk  6.2    5.0                                3X Avg.Risk  8.0    6.1   Ferritin     Status: None   Collection Time: 01/04/20 10:54 AM  Result Value Ref Range   Ferritin 74 11 - 307 ng/mL    Comment: Performed at [facility], [address] [place], [place] [address]  CBC     Status: Abnormal   Collection Time: 01/04/20 10:54 AM  Result Value Ref Range   WBC 3.9 (L) 4.0 - 10.5 K/uL   RBC 4.17 3.87 - 5.11 MIL/uL   Hemoglobin 12.4 12.0 - 15.0 g/dL   HCT 38.6 36 - 46 %   MCV 92.6 80.0 - 100.0 fL   MCH 29.7 26.0 - 34.0 pg   MCHC 32.1 30.0 - 36.0 g/dL   RDW 12.8 11.5 - 15.5 %   Platelets 224 150 - 400 K/uL   nRBC 0.0 0.0 - 0.2 %    Comment: Performed at [facility], [address], [place], [place] [address]    Assessment/Plan:  Essential hypertension blood pressure control important in reducing the progression of atherosclerotic disease. On appropriate oral medications.   Dyslipidemia lipid control important in reducing the progression of atherosclerotic disease. Continue statin therapy   Carotid artery stenosis, symptomatic The patient has a previous history of carotid  endarterectomy on the right and mild carotid disease on the left.  She has a remote history of stroke about 7 years ago prior to surgery, no obvious residual deficits.  At this point, it has been about 2 years since her carotids have been checked and I would recommend a carotid duplex for further evaluation of her carotids.  I discussed the pathophysiology and natural history of carotid disease.  I discussed the reason and rationale for continuing to monitor this as well as when we consider intervention.  She voices her understanding.  She will continue her Plavix and statin agent we will have her return in the near future with a carotid duplex.      Leotis Pain 02/02/2020, 5:05 PM   This note was created with Dragon medical transcription system.  Any errors from dictation are unintentional.

## 2020-02-02 NOTE — Patient Instructions (Signed)
Carotid Artery Disease  Carotid artery disease is the narrowing or blockage of one or both carotid arteries. This condition is also called carotid artery stenosis. The carotid arteries are the two main blood vessels on either side of the neck. They send blood to the brain, other parts of the head, and the neck.  This condition increases your risk for a stroke or a transient ischemic attack (TIA). A TIA is a "mini-stroke" that causes stroke-like symptoms that go away quickly. What are the causes? This condition is mainly caused by a narrowing and hardening of the carotid arteries. The carotid arteries can become narrow or clogged with a buildup of plaque. Plaque includes:  Fat.  Cholesterol.  Calcium.  Other substances. What increases the risk? The following factors may make you more likely to develop this condition:  Having certain medical conditions, such as: ? High cholesterol. ? High blood pressure. ? Diabetes. ? Obesity.  Smoking.  A family history of cardiovascular disease.  Not being active or lack of regular exercise.  Being female. Men have a higher risk of having arteries become narrow and harden earlier in life than women.  Old age. What are the signs or symptoms? This condition may not have any signs or symptoms until a stroke or TIA happens. In some cases, your doctor may be able to hear a whooshing sound. This can suggest a change in blood flow caused by plaque buildup. An eye exam can also help find signs of the condition. How is this treated? This condition may be treated with more than one treatment. Treatment options include:  Lifestyle changes, such as: ? Quitting smoking. ? Getting regular exercise, or getting exercise as told by your doctor. ? Eating a healthy diet. ? Managing stress. ? Keeping a healthy weight.  Medicines to control: ? Blood pressure. ? Cholesterol. ? Blood clotting.  Surgery. You may have: ? A surgery to remove the blockages in  the carotid arteries. ? A procedure in which a small mesh tube (stent) is used to widen the blocked carotid arteries. Follow these instructions at home: Eating and drinking Follow instructions about your diet from your doctor. It is important to follow a healthy diet.  Eat a diet that includes: ? A lot of fresh fruits and vegetables. ? Low-fat (lean) meats.  Avoid these foods: ? Foods that are high in fat. ? Foods that are high in salt (sodium). ? Foods that are fried. ? Foods that are processed. ? Foods that have few good nutrients (poor nutritional value).  Lifestyle   Keep a healthy weight.  Do exercises as told by your doctor to stay active. Each week, you should get one of the following: ? At least 150 minutes of exercise that raises your heart rate and makes you sweat (moderate-intensity exercise). ? At least 75 minutes of exercise that takes a lot of effort.  Do not use any products that contain nicotine or tobacco, such as cigarettes, e-cigarettes, and chewing tobacco. If you need help quitting, ask your doctor.  Do not drink alcohol if: ? Your doctor tells you not to drink. ? You are pregnant, may be pregnant, or are planning to become pregnant.  If you drink alcohol: ? Limit how much you use to:  0-1 drink a day for women.  0-2 drinks a day for men. ? Be aware of how much alcohol is in your drink. In the [place] one drink equals one 12 oz bottle of beer (355 mL), one 5   oz glass of wine (148 mL), or one 1 oz glass of hard liquor (44 mL).  Do not use drugs.  Manage your stress. Ask your doctor for tips on how to do this. General instructions  Take over-the-counter and prescription medicines only as told by your doctor.  Keep all follow-up visits as told by your doctor. This is important. Where to find more information  American Heart Association: www.heart.org Get help right away if:  You have any signs of a stroke. "BE FAST" is an easy way to remember the  main warning signs: ? B - Balance. Signs are dizziness, sudden trouble walking, or loss of balance. ? E - Eyes. Signs are trouble seeing or a change in how you see. ? F - Face. Signs are sudden weakness or loss of feeling of the face, or the face or eyelid drooping on one side. ? A - Arms. Signs are weakness or loss of feeling in an arm. This happens suddenly and usually on one side of the body. ? S - Speech. Signs are sudden trouble speaking, slurred speech, or trouble understanding what people say. ? T - Time. Time to call emergency services. Write down what time symptoms started.  You have other signs of a stroke, such as: ? A sudden, very bad headache with no known cause. ? Feeling like you may vomit (nausea). ? Vomiting. ? A seizure. These symptoms may be an emergency. Do not wait to see if the symptoms will go away. Get medical help right away. Call your local emergency services (911 in the [place] Do not drive yourself to the hospital. Summary  The carotid arteries are blood vessels on both sides of the neck.  If these arteries get smaller or get blocked, you are more likely to have a stroke or a mini-stroke.  This condition can be treated with lifestyle changes, medicines, surgery, or a blend of these treatments.  Get help right away if you have any signs of a stroke. "BE FAST" is an easy way to remember the main warning signs of stroke. This information is not intended to replace advice given to you by your health care provider. Make sure you discuss any questions you have with your health care provider. Document Revised: 12/29/2018 Document Reviewed: 12/29/2018 Elsevier Patient Education  2020 [employer]  

## 2020-02-04 DIAGNOSIS — Z96651 Presence of right artificial knee joint: Secondary | ICD-10-CM | POA: Diagnosis not present

## 2020-02-04 DIAGNOSIS — M25561 Pain in right knee: Secondary | ICD-10-CM | POA: Diagnosis not present

## 2020-02-04 DIAGNOSIS — M6281 Muscle weakness (generalized): Secondary | ICD-10-CM | POA: Diagnosis not present

## 2020-02-04 DIAGNOSIS — M25661 Stiffness of right knee, not elsewhere classified: Secondary | ICD-10-CM | POA: Diagnosis not present

## 2020-02-09 DIAGNOSIS — M25661 Stiffness of right knee, not elsewhere classified: Secondary | ICD-10-CM | POA: Diagnosis not present

## 2020-02-09 DIAGNOSIS — M6281 Muscle weakness (generalized): Secondary | ICD-10-CM | POA: Diagnosis not present

## 2020-02-09 DIAGNOSIS — M25561 Pain in right knee: Secondary | ICD-10-CM | POA: Diagnosis not present

## 2020-02-09 DIAGNOSIS — Z96651 Presence of right artificial knee joint: Secondary | ICD-10-CM | POA: Diagnosis not present

## 2020-02-11 DIAGNOSIS — M6281 Muscle weakness (generalized): Secondary | ICD-10-CM | POA: Diagnosis not present

## 2020-02-11 DIAGNOSIS — M25561 Pain in right knee: Secondary | ICD-10-CM | POA: Diagnosis not present

## 2020-02-11 DIAGNOSIS — M25661 Stiffness of right knee, not elsewhere classified: Secondary | ICD-10-CM | POA: Diagnosis not present

## 2020-02-11 DIAGNOSIS — Z96651 Presence of right artificial knee joint: Secondary | ICD-10-CM | POA: Diagnosis not present

## 2020-02-23 ENCOUNTER — Other Ambulatory Visit: Payer: Self-pay

## 2020-02-23 ENCOUNTER — Encounter (INDEPENDENT_AMBULATORY_CARE_PROVIDER_SITE_OTHER): Payer: Self-pay | Admitting: Nurse Practitioner

## 2020-02-23 ENCOUNTER — Ambulatory Visit (INDEPENDENT_AMBULATORY_CARE_PROVIDER_SITE_OTHER): Payer: PPO

## 2020-02-23 ENCOUNTER — Ambulatory Visit (INDEPENDENT_AMBULATORY_CARE_PROVIDER_SITE_OTHER): Payer: PPO | Admitting: Nurse Practitioner

## 2020-02-23 VITALS — BP 135/71 | HR 66 | Resp 16 | Ht 64.0 in | Wt 207.0 lb

## 2020-02-23 DIAGNOSIS — I771 Stricture of artery: Secondary | ICD-10-CM | POA: Diagnosis not present

## 2020-02-23 DIAGNOSIS — I6523 Occlusion and stenosis of bilateral carotid arteries: Secondary | ICD-10-CM | POA: Diagnosis not present

## 2020-02-23 DIAGNOSIS — I10 Essential (primary) hypertension: Secondary | ICD-10-CM

## 2020-02-23 NOTE — Progress Notes (Signed)
Subjective:    Patient ID: Ellen Baker, female    DOB: 1951/04/19, 69 y.o.   MRN: [id number] Chief Complaint  Patient presents with  . Follow-up    ultrasound    The patient is seen for follow up evaluation of carotid stenosis. The carotid stenosis followed by ultrasound.   The patient denies amaurosis fugax. There is no recent history of TIA symptoms or focal motor deficits. There is no prior documented CVA.  The patient is taking enteric-coated aspirin 81 mg daily.  There is no history of migraine headaches. There is no history of seizures.  The patient has a history of coronary artery disease, no recent episodes of angina or shortness of breath. The patient denies PAD or claudication symptoms. There is a history of hyperlipidemia which is being treated with a statin.    Carotid Duplex done today shows 1-39% stenosis bilaterally.  Decrease in velocities compared to 11/29/2017.  Bilateral upper extremity BP readings showed a >41mmHg difference.  Noninvasive studies reveal a velocity of 123 in the right subclavian with a velocity of 80 in the left subclavian however the waveforms are multiphasic with normal flow hemodynamics.  There is also antegrade flow within the bilateral vertebral arteries.   Review of Systems  Eyes: Negative for visual disturbance.  Neurological: Negative for dizziness, speech difficulty and weakness.  All other systems reviewed and are negative.      Objective:   Physical Exam Vitals reviewed.  HENT:     Head: Normocephalic.  Neck:     Vascular: No carotid bruit.  Cardiovascular:     Rate and Rhythm: Normal rate and regular rhythm.     Pulses:          Radial pulses are 2+ on the right side and 1+ on the left side.     Heart sounds: Normal heart sounds.  Pulmonary:     Effort: Pulmonary effort is normal.     Breath sounds: Normal breath sounds.  Musculoskeletal:        General: Normal range of motion.  Skin:    General: Skin is warm  and dry.  Neurological:     Mental Status: She is alert and oriented to person, place, and time.  Psychiatric:        Mood and Affect: Mood normal.        Behavior: Behavior normal.        Thought Content: Thought content normal.        Judgment: Judgment normal.     BP 135/71 (BP Location: Left Arm)   Pulse 66   Resp 16   Ht 5\' 4"  (1.626 m)   Wt 207 lb (93.9 kg)   BMI 35.53 kg/m   Past Medical History:  Diagnosis Date  . Arthritis    right knee  . Carotid artery occlusion   . Dizziness   . GERD (gastroesophageal reflux disease)   . Heart murmur   . History of colon polyps   . Hyperlipidemia   . Hypertension   . Iron deficiency   . Stroke [facility]) 12-31-12   no residual effects    Social History   Socioeconomic History  . Marital status: Widowed    Spouse name: Not on file  . Number of children: 3  . Years of education: Not on file  . Highest education level: 12th grade  Occupational History    Employer: [employer]  Tobacco Use  . Smoking status: Never Smoker  . Smokeless tobacco:  Never Used  . Tobacco comment: Smoking cessation materials not required  Vaping Use  . Vaping Use: Never used  Substance and Sexual Activity  . Alcohol use: No    Alcohol/week: 0.0 standard drinks  . Drug use: No  . Sexual activity: Never  Other Topics Concern  . Not on file  Social History Narrative  . Not on file   Social Determinants of Health   Financial Resource Strain:   . Difficulty of Paying Living Expenses: Not on file  Food Insecurity:   . Worried About Charity fundraiser in the Last Year: Not on file  . Ran Out of Food in the Last Year: Not on file  Transportation Needs:   . Lack of Transportation (Medical): Not on file  . Lack of Transportation (Non-Medical): Not on file  Physical Activity:   . Days of Exercise per Week: Not on file  . Minutes of Exercise per Session: Not on file  Stress:   . Feeling of Stress : Not on file  Social Connections:   .  Frequency of Communication with Friends and Family: Not on file  . Frequency of Social Gatherings with Friends and Family: Not on file  . Attends Religious Services: Not on file  . Active Member of Clubs or Organizations: Not on file  . Attends Archivist Meetings: Not on file  . Marital Status: Not on file  Intimate Partner Violence:   . Fear of Current or Ex-Partner: Not on file  . Emotionally Abused: Not on file  . Physically Abused: Not on file  . Sexually Abused: Not on file    Past Surgical History:  Procedure Laterality Date  . CAROTID ENDARTERECTOMY     RIGHT  01/19/13  . COLONOSCOPY  08-18-12  . COLONOSCOPY WITH PROPOFOL N/A 03/10/2018   Procedure: COLONOSCOPY WITH PROPOFOL;  Surgeon: Lollie Sails, MD;  Location: [facility] ENDOSCOPY;  Service: Endoscopy;  Laterality: N/A;  . DILATION AND CURETTAGE OF UTERUS    . ENDARTERECTOMY Right 01/19/2013   Procedure: ENDARTERECTOMY CAROTID with patch angioplasty;  Surgeon: Conrad Fred[place], MD;  Location: [place];  Service: Vascular;  Laterality: Right;  . ESOPHAGEAL DILATION    . JOINT REPLACEMENT Left 04/15/2017  . TOTAL KNEE ARTHROPLASTY Right 12/01/2019   Procedure: TOTAL KNEE ARTHROPLASTY - RNFA;  Surgeon: Corky Mull, MD;  Location: [facility] ORS;  Service: Orthopedics;  Laterality: Right;  . TUBAL LIGATION    . UPPER GI ENDOSCOPY  08-18-12    Family History  Problem Relation Age of Onset  . Stroke Mother   . Deep vein thrombosis Mother   . Heart disease Mother        Amputation-  . Hypertension Mother   . Alzheimer's disease Mother   . Heart failure Father   . Heart disease Father        CHF  . Hypertension Father   . Varicose Veins Father   . Parkinson's disease Father   . Breast cancer Paternal Aunt 10    Allergies  Allergen Reactions  . Adhesive [Tape] Rash    Skin became RAW       Assessment & Plan:   1. Symptomatic stenosis of both carotid arteries Recommend:  Given the patient's asymptomatic  subcritical stenosis no further invasive testing or surgery at this time.  Duplex ultrasound shows 1-39% stenosis bilaterally.  Continue antiplatelet therapy as prescribed Continue management of CAD, HTN and Hyperlipidemia Healthy heart diet,  encouraged exercise at least  4 times per week Follow up in 12 months with duplex ultrasound and physical exam   2. Essential hypertension Continue antihypertensive medications as already ordered, these medications have been reviewed and there are no changes at this time.   3. Subclavian artery stenosis, left (HCC) Currently the patient is asymptomatic.  Discussed with the patient that generally signs and symptoms of worsening stenosis may be numbness or tingling of the hand, claudication symptoms with frequent movement and activity or even dizziness.  If any of those symptoms should arise she should contact us sooner.  Otherwise we will continue to follow the patient in 1 year with her carotid follow-up studies.   Current Outpatient Medications on File Prior to Visit  Medication Sig Dispense Refill  . Calcium Carbonate-Vitamin D3 600-400 MG-UNIT TABS Take 1 tablet by mouth daily.    . clopidogrel (PLAVIX) 75 MG tablet Take 1 tablet (75 mg total) by mouth daily. 90 tablet 1  . ibuprofen (ADVIL) 200 MG tablet Take by mouth.    . Iron-Vitamin C 65-125 MG TABS Take 1 tablet by mouth 2 (two) times daily. 60 tablet 1  . loratadine (CLARITIN) 10 MG tablet Take 1 tablet (10 mg total) by mouth continuous as needed for allergies. 30 tablet 6  . losartan (COZAAR) 50 MG tablet TAKE 1 TABLET BY MOUTH DAILY 90 tablet 1  . Multiple Vitamins-Minerals (CENTRUM SILVER ULTRA WOMENS) TABS Take 1 tablet by mouth daily.    . simvastatin (ZOCOR) 20 MG tablet Take 1 tablet (20 mg total) by mouth daily. 90 tablet 1  . vitamin B-12 (CYANOCOBALAMIN) 1000 MCG tablet Take 1,000 mcg by mouth daily.    . meclizine (ANTIVERT) 25 MG tablet Take 1 tablet (25 mg total) by mouth 3  (three) times daily as needed for dizziness. (Patient not taking: Reported on 02/02/2020) 30 tablet 0  . oxyCODONE (OXY IR/ROXICODONE) 5 MG immediate release tablet Take 1-2 tablets (5-10 mg total) by mouth every 4 (four) hours as needed for moderate pain (pain score 4-6). (Patient not taking: Reported on 02/02/2020) 40 tablet 0  . pantoprazole (PROTONIX) 40 MG tablet Take 1 tablet (40 mg total) by mouth daily. (Patient not taking: Reported on 02/02/2020) 90 tablet 1  . traMADol (ULTRAM) 50 MG tablet Take 1 tablet (50 mg total) by mouth every 6 (six) hours as needed for moderate pain. (Patient not taking: Reported on 02/02/2020) 30 tablet 0   No current facility-administered medications on file prior to visit.    There are no Patient Instructions on file for this visit. No follow-ups on file.   Kris Hartmann, NP

## 2020-02-26 DIAGNOSIS — H2513 Age-related nuclear cataract, bilateral: Secondary | ICD-10-CM | POA: Diagnosis not present

## 2020-03-23 DIAGNOSIS — M1711 Unilateral primary osteoarthritis, right knee: Secondary | ICD-10-CM | POA: Diagnosis not present

## 2020-03-23 DIAGNOSIS — Z96651 Presence of right artificial knee joint: Secondary | ICD-10-CM | POA: Diagnosis not present

## 2020-03-29 DIAGNOSIS — Z96651 Presence of right artificial knee joint: Secondary | ICD-10-CM | POA: Diagnosis not present

## 2020-03-29 DIAGNOSIS — M6281 Muscle weakness (generalized): Secondary | ICD-10-CM | POA: Diagnosis not present

## 2020-03-29 DIAGNOSIS — M25661 Stiffness of right knee, not elsewhere classified: Secondary | ICD-10-CM | POA: Diagnosis not present

## 2020-03-29 DIAGNOSIS — M25561 Pain in right knee: Secondary | ICD-10-CM | POA: Diagnosis not present

## 2020-04-04 ENCOUNTER — Other Ambulatory Visit: Payer: Self-pay

## 2020-04-04 ENCOUNTER — Inpatient Hospital Stay: Payer: PPO | Attending: Hematology and Oncology

## 2020-04-04 DIAGNOSIS — Z86010 Personal history of colonic polyps: Secondary | ICD-10-CM | POA: Diagnosis not present

## 2020-04-04 DIAGNOSIS — Z8673 Personal history of transient ischemic attack (TIA), and cerebral infarction without residual deficits: Secondary | ICD-10-CM | POA: Diagnosis not present

## 2020-04-04 DIAGNOSIS — D509 Iron deficiency anemia, unspecified: Secondary | ICD-10-CM | POA: Diagnosis not present

## 2020-04-04 DIAGNOSIS — E785 Hyperlipidemia, unspecified: Secondary | ICD-10-CM | POA: Insufficient documentation

## 2020-04-04 DIAGNOSIS — M25561 Pain in right knee: Secondary | ICD-10-CM | POA: Diagnosis not present

## 2020-04-04 DIAGNOSIS — E538 Deficiency of other specified B group vitamins: Secondary | ICD-10-CM | POA: Diagnosis not present

## 2020-04-04 DIAGNOSIS — D649 Anemia, unspecified: Secondary | ICD-10-CM

## 2020-04-04 DIAGNOSIS — Z803 Family history of malignant neoplasm of breast: Secondary | ICD-10-CM | POA: Insufficient documentation

## 2020-04-04 DIAGNOSIS — I10 Essential (primary) hypertension: Secondary | ICD-10-CM | POA: Diagnosis not present

## 2020-04-04 DIAGNOSIS — Z79899 Other long term (current) drug therapy: Secondary | ICD-10-CM | POA: Insufficient documentation

## 2020-04-04 LAB — CBC WITH DIFFERENTIAL/PLATELET
Abs Immature Granulocytes: 0.01 10*3/uL (ref 0.00–0.07)
Basophils Absolute: 0 10*3/uL (ref 0.0–0.1)
Basophils Relative: 1 %
Eosinophils Absolute: 0.2 10*3/uL (ref 0.0–0.5)
Eosinophils Relative: 4 %
HCT: 39.4 % (ref 36.0–46.0)
Hemoglobin: 13 g/dL (ref 12.0–15.0)
Immature Granulocytes: 0 %
Lymphocytes Relative: 22 %
Lymphs Abs: 0.9 10*3/uL (ref 0.7–4.0)
MCH: 29.9 pg (ref 26.0–34.0)
MCHC: 33 g/dL (ref 30.0–36.0)
MCV: 90.6 fL (ref 80.0–100.0)
Monocytes Absolute: 0.4 10*3/uL (ref 0.1–1.0)
Monocytes Relative: 9 %
Neutro Abs: 2.7 10*3/uL (ref 1.7–7.7)
Neutrophils Relative %: 64 %
Platelets: 254 10*3/uL (ref 150–400)
RBC: 4.35 MIL/uL (ref 3.87–5.11)
RDW: 12.8 % (ref 11.5–15.5)
WBC: 4.3 10*3/uL (ref 4.0–10.5)
nRBC: 0 % (ref 0.0–0.2)

## 2020-04-04 LAB — FERRITIN: Ferritin: 41 ng/mL (ref 11–307)

## 2020-04-04 NOTE — Progress Notes (Signed)
[facility]  [address], [address] [place], [place] [address] Phone: [phone]  Fax: [phone]   Clinic Day:  04/05/2020  Referring physician: Juline Patch, MD  Chief Complaint: Ellen Baker is a 69 y.o. female with iron deficiency anemia and B12 deficiency who is seen for 6 month assessment.  HPI: The patient was last seen in the hematology clinic on 10/06/2019. At that time, she felt good.  She was taking oral iron twice daily.  Stool was dark.  She denied any bleeding.  Exam was stable. Hematocrit was 40.1, hemoglobin 13.4, platelets 239,000, WBC 6,500. Ferritin was 82. She continued oral B12 and oral iron.  The patient underwent right total knee arthroplasty on 12/01/2019 by Dr. Roland Rack.  The patient saw Eulogio Ditch, NP on 02/23/2020. Given the patient's asymptomatic subcritical carotid stenosis, it was felt that no further invasive testing or surgery was necessary at that time. Follow up planned for 1 year.  Labs on 01/04/2020 revealed a hematocrit of 38.6, hemoglobin 12.4, platelets 224,000, WBC 3,900. Ferritin was 74.  During the interim, she has been "alright." She is still having right knee pain. She has lost 19 lbs during the interim. She has been eating less since her surgery. Her goal weight is 150 lbs, but she is unsure if this is possible for her.  She was taking oral iron once daily. Two weeks ago, she switched back to two pills. She eats liver, turnip greens, and pinto beans. She eats red meat occasionally and does not eat much fish. She is still taking Vitamin B12.   Past Medical History:  Diagnosis Date  . Arthritis    right knee  . Carotid artery occlusion   . Dizziness   . GERD (gastroesophageal reflux disease)   . Heart murmur   . History of colon polyps   . Hyperlipidemia   . Hypertension   . Iron deficiency   . Stroke [facility]) 12-31-12   no residual effects    Past Surgical History:  Procedure Laterality Date  .  CAROTID ENDARTERECTOMY     RIGHT  01/19/13  . COLONOSCOPY  08-18-12  . COLONOSCOPY WITH PROPOFOL N/A 03/10/2018   Procedure: COLONOSCOPY WITH PROPOFOL;  Surgeon: Lollie Sails, MD;  Location: [facility] ENDOSCOPY;  Service: Endoscopy;  Laterality: N/A;  . DILATION AND CURETTAGE OF UTERUS    . ENDARTERECTOMY Right 01/19/2013   Procedure: ENDARTERECTOMY CAROTID with patch angioplasty;  Surgeon: Conrad [place], MD;  Location: [place];  Service: Vascular;  Laterality: Right;  . ESOPHAGEAL DILATION    . JOINT REPLACEMENT Left 04/15/2017  . taotal knee arthorplasty Right 2021  . TOTAL KNEE ARTHROPLASTY Right 12/01/2019   Procedure: TOTAL KNEE ARTHROPLASTY - RNFA;  Surgeon: Corky Mull, MD;  Location: [facility] ORS;  Service: Orthopedics;  Laterality: Right;  . TUBAL LIGATION    . UPPER GI ENDOSCOPY  08-18-12    Family History  Problem Relation Age of Onset  . Stroke Mother   . Deep vein thrombosis Mother   . Heart disease Mother        Amputation-  . Hypertension Mother   . Alzheimer's disease Mother   . Heart failure Father   . Heart disease Father        CHF  . Hypertension Father   . Varicose Veins Father   . Parkinson's disease Father   . Breast cancer Paternal Aunt 68    Social History:  reports that she has never smoked.  She has never used smokeless tobacco. She reports that she does not drink alcohol and does not use drugs.the patient recently retired back in 05/2019. 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 She was recently furloughed for a short period from her job due to the pandemic. She lives in [place]. Her daughter's name is Amy. She has a dog named Water quality scientist (havanese breed). She is alone today.   Allergies:  Allergies  Allergen Reactions  . Adhesive [Tape] Rash    Skin became RAW    Current Medications: Current Outpatient Medications  Medication Sig Dispense Refill  . Calcium Carbonate-Vitamin D3 600-400 MG-UNIT TABS Take 1 tablet by mouth daily.    . clopidogrel (PLAVIX) 75 MG  tablet Take 1 tablet (75 mg total) by mouth daily. 90 tablet 1  . ibuprofen (ADVIL) 200 MG tablet Take by mouth.    . Iron-Vitamin C 65-125 MG TABS Take 1 tablet by mouth 2 (two) times daily. 60 tablet 1  . loratadine (CLARITIN) 10 MG tablet Take 1 tablet (10 mg total) by mouth continuous as needed for allergies. 30 tablet 6  . losartan (COZAAR) 50 MG tablet TAKE 1 TABLET BY MOUTH DAILY 90 tablet 1  . Multiple Vitamins-Minerals (CENTRUM SILVER ULTRA WOMENS) TABS Take 1 tablet by mouth daily.    . simvastatin (ZOCOR) 20 MG tablet Take 1 tablet (20 mg total) by mouth daily. 90 tablet 1  . vitamin B-12 (CYANOCOBALAMIN) 1000 MCG tablet Take 1,000 mcg by mouth daily.    . meclizine (ANTIVERT) 25 MG tablet Take 1 tablet (25 mg total) by mouth 3 (three) times daily as needed for dizziness. (Patient not taking: Reported on 02/02/2020) 30 tablet 0  . oxyCODONE (OXY IR/ROXICODONE) 5 MG immediate release tablet Take 1-2 tablets (5-10 mg total) by mouth every 4 (four) hours as needed for moderate pain (pain score 4-6). (Patient not taking: Reported on 02/02/2020) 40 tablet 0  . pantoprazole (PROTONIX) 40 MG tablet Take 1 tablet (40 mg total) by mouth daily. (Patient not taking: Reported on 02/02/2020) 90 tablet 1  . traMADol (ULTRAM) 50 MG tablet Take 1 tablet (50 mg total) by mouth every 6 (six) hours as needed for moderate pain. (Patient not taking: Reported on 02/02/2020) 30 tablet 0   No current facility-administered medications for this visit.    Review of Systems  Constitutional: Positive for weight loss (19 lbs). Negative for chills, diaphoresis, fever and malaise/fatigue.       Feels "good". Active.  HENT: Negative.  Negative for congestion, ear discharge, ear pain, hearing loss, nosebleeds, sinus pain, sore throat and tinnitus.   Eyes: Negative.  Negative for blurred vision.  Respiratory: Negative.  Negative for cough, hemoptysis, sputum production and shortness of breath.   Cardiovascular: Negative.   Negative for chest pain, palpitations and leg swelling.  Gastrointestinal: Positive for melena (mild; on oral iron). Negative for abdominal pain, blood in stool, constipation, diarrhea, heartburn, nausea and vomiting.       Trying to eat iron-rich foods.  Genitourinary: Negative.  Negative for dysuria, frequency, hematuria and urgency.  Musculoskeletal: Positive for joint pain (RIGHT knee). Negative for back pain, falls, myalgias and neck pain.  Skin: Negative.  Negative for itching and rash.  Neurological: Negative for dizziness, tingling, speech change, weakness and headaches.       History of a CVA in 2014.  Endo/Heme/Allergies: Does not bruise/bleed easily.  Psychiatric/Behavioral: Negative for depression and memory loss. The patient is not nervous/anxious and does not have insomnia.  All other systems reviewed and are negative.  Performance status (ECOG): 1  Vitals Blood pressure (!) 158/76, pulse 67, temperature 97.6 F (36.4 C), temperature source Tympanic, resp. rate 18, height 5\' 4"  (1.626 m), weight 203 lb 2.5 oz (92.1 kg), SpO2 100 %.   Physical Exam Vitals and nursing note reviewed.  Constitutional:      General: She is not in acute distress.    Appearance: She is well-developed. She is not diaphoretic.  HENT:     Head: Normocephalic and atraumatic.     Mouth/Throat:     Pharynx: No oropharyngeal exudate.  Eyes:     General: No scleral icterus.    Conjunctiva/sclera: Conjunctivae normal.     Pupils: Pupils are equal, round, and reactive to light.     Comments: Blue eyes. Glasses.  Neck:     Vascular: No JVD.  Cardiovascular:     Rate and Rhythm: Normal rate and regular rhythm.     Heart sounds: Normal heart sounds. No murmur heard.   Pulmonary:     Effort: Pulmonary effort is normal. No respiratory distress.     Breath sounds: Normal breath sounds. No wheezing.  Abdominal:     General: Bowel sounds are normal. There is no distension.     Palpations: Abdomen is  soft. There is no hepatomegaly or splenomegaly.     Tenderness: There is no abdominal tenderness.  Musculoskeletal:        General: No swelling or tenderness. Normal range of motion.     Cervical back: Normal range of motion and neck supple.     Right lower leg: No edema.     Left lower leg: No edema.  Lymphadenopathy:     Head:     Right side of head: No preauricular, posterior auricular or occipital adenopathy.     Left side of head: No preauricular, posterior auricular or occipital adenopathy.     Cervical: No cervical adenopathy.     Upper Body:     Right upper body: No supraclavicular or axillary adenopathy.     Left upper body: No supraclavicular or axillary adenopathy.     Lower Body: No right inguinal adenopathy. No left inguinal adenopathy.  Skin:    General: Skin is warm and dry.     Findings: No erythema.  Neurological:     Mental Status: She is alert and oriented to person, place, and time.  Psychiatric:        Behavior: Behavior normal.        Thought Content: Thought content normal.        Judgment: Judgment normal.    Appointment on 04/04/2020  Component Date Value Ref Range Status  . Ferritin 04/04/2020 41  11 - 307 ng/mL Final   Performed at [facility], [address] [place], [place] [address]  . WBC 04/04/2020 4.3  4.0 - 10.5 K/uL Final  . RBC 04/04/2020 4.35  3.87 - 5.11 MIL/uL Final  . Hemoglobin 04/04/2020 13.0  12.0 - 15.0 g/dL Final  . HCT 04/04/2020 39.4  36 - 46 % Final  . MCV 04/04/2020 90.6  80.0 - 100.0 fL Final  . MCH 04/04/2020 29.9  26.0 - 34.0 pg Final  . MCHC 04/04/2020 33.0  30.0 - 36.0 g/dL Final  . RDW 04/04/2020 12.8  11.5 - 15.5 % Final  . Platelets 04/04/2020 254  150 - 400 K/uL Final  . nRBC 04/04/2020 0.0  0.0 - 0.2 % Final  . Neutrophils  Relative % 04/04/2020 64  % Final  . Neutro Abs 04/04/2020 2.7  1.7 - 7.7 K/uL Final  . Lymphocytes Relative 04/04/2020 22  % Final  . Lymphs Abs 04/04/2020 0.9  0.7 - 4.0 K/uL Final   . Monocytes Relative 04/04/2020 9  % Final  . Monocytes Absolute 04/04/2020 0.4  0.1 - 1.0 K/uL Final  . Eosinophils Relative 04/04/2020 4  % Final  . Eosinophils Absolute 04/04/2020 0.2  0.0 - 0.5 K/uL Final  . Basophils Relative 04/04/2020 1  % Final  . Basophils Absolute 04/04/2020 0.0  0.0 - 0.1 K/uL Final  . Immature Granulocytes 04/04/2020 0  % Final  . Abs Immature Granulocytes 04/04/2020 0.01  0.00 - 0.07 K/uL Final   Performed at [facility], [address] [place], [place] [address]    Assessment:  [place] is a 69 y.o. female with iron deficiency. Diet appears good. She denies any melena, hematochezia, hematuria or vaginal bleeding. She is on Plavix.  Work-up on 07/21/2018 revealed a hematocrit of 35.4, hemoglobin 10.9, MCV 81.6, platelets 331,000, WBC 6300 with an ANC of 3900.  Ferritin was 8 with an iron saturation of 4% and a TIBC of 529.  B12 was 255 (low normal).  Retic was 1.1%.  Normal studies included folate, TSH, and urinalysis (no hematuria).  She is on Vitron-C.  Ferritin has been followed: 14 on 06/06/2018, 17 on 06/27/2018, 8 on 07/21/2018, 16 on 08/25/2018, 23 on 09/22/2018, 42 on 11/26/2018, 59 on 01/20/2019, 67 on 03/18/2019, 82 on 06/02/2019, 69 on 08/04/2019, 82 on 10/05/2019, 74 on 01/04/2020, and 41 on 04/04/2020.  EGD on 08/18/2012 revealed reflux esophagitis.  Esophageal biopsy at 32 cm revealed columnar mucosa with acute and chronic inflammation.  Esophageal biopsy at 30 cm revealed squamous mucosa with reflux changes and mild chronic inflammation.  There was no dysplasia or malignancy.  Colonoscopy on 03/10/2018 revealed diverticulosis in the sigmoid colon, in the descending colon, in the transverse colon and in the ascending colon.  There was one 3 mm polyp at the hepatic flexure (tubular adenoma) and two 4 to 6 mm polyps (hyperplastic) in the sigmoid colon.  She has B12 deficiency.  She started oral B12 on 07/22/2018.  B12  was 255 on 02/03/20200 and 476 on 08/25/2018.  Folate was 35 on 07/21/2018.  She had her second COVID-19 vaccine on 09/01/2019.  Symptomatically, she feels "alright".  She has lost 19 lbs since her surgery; she is eating less.  She was taking oral iron once daily; two weeks ago, she switched back to 2 pills. She eats liver, turnip greens, and pinto beans. She eats red meat occasionally and does not eat much fish. She is taking Vitamin B12.  Exam is stable.  Plan: 1.   Review labs from 04/04/2020. 2.   Iron deficiency anemia Hematocrit 39.4.  Hemoglobin  13.0.  MCV 90.6. Ferritin 41. She is on oral iron 2 pills a day.    Diet appears good Ferritin goal 100.  Discss plan to taper oral iron to assess stability of counts   Decrease iron to 1 pill a day now.  Plan to decrease iron to every other day then off depending on hemoglobin. Continue to monitor 3.   B12 deficiency B12 was 476 on 08/25/2018. Continues oral B12. Check B12 and folate annually. 4.   RTC in 2 months for labs (CBC and ferritin, B12, folate). 5.   RTC in 4 months for MD assessment and labs (  CBC with diff, ferritin- day before).  I discussed the assessment and treatment plan with the patient.  The patient was provided an opportunity to ask questions and all were answered.  The patient agreed with the plan and demonstrated an understanding of the instructions.  The patient was advised to call back if the symptoms worsen or if the condition fails to improve as anticipated.   Lequita Asal, MD, PhD    04/05/2020, 11:29 AM  I, Mirian Mo Tufford, am acting as Education administrator for [employer]. Mike Gip, MD, PhD.  I, Melessa Cowell C. Mike Gip, MD, have reviewed the above documentation for accuracy and completeness, and I agree with the above.

## 2020-04-05 ENCOUNTER — Encounter: Payer: Self-pay | Admitting: Hematology and Oncology

## 2020-04-05 ENCOUNTER — Inpatient Hospital Stay: Payer: PPO

## 2020-04-05 ENCOUNTER — Inpatient Hospital Stay: Payer: PPO | Admitting: Hematology and Oncology

## 2020-04-05 VITALS — BP 158/76 | HR 67 | Temp 97.6°F | Resp 18 | Ht 64.0 in | Wt 203.2 lb

## 2020-04-05 DIAGNOSIS — E538 Deficiency of other specified B group vitamins: Secondary | ICD-10-CM | POA: Diagnosis not present

## 2020-04-05 DIAGNOSIS — M25661 Stiffness of right knee, not elsewhere classified: Secondary | ICD-10-CM | POA: Diagnosis not present

## 2020-04-05 DIAGNOSIS — D649 Anemia, unspecified: Secondary | ICD-10-CM | POA: Diagnosis not present

## 2020-04-05 DIAGNOSIS — M25561 Pain in right knee: Secondary | ICD-10-CM | POA: Diagnosis not present

## 2020-04-05 DIAGNOSIS — D509 Iron deficiency anemia, unspecified: Secondary | ICD-10-CM | POA: Diagnosis not present

## 2020-04-05 DIAGNOSIS — Z96651 Presence of right artificial knee joint: Secondary | ICD-10-CM | POA: Diagnosis not present

## 2020-04-05 DIAGNOSIS — M6281 Muscle weakness (generalized): Secondary | ICD-10-CM | POA: Diagnosis not present

## 2020-04-05 NOTE — Progress Notes (Signed)
No new changes noted today 

## 2020-04-07 DIAGNOSIS — M6281 Muscle weakness (generalized): Secondary | ICD-10-CM | POA: Diagnosis not present

## 2020-04-07 DIAGNOSIS — M25661 Stiffness of right knee, not elsewhere classified: Secondary | ICD-10-CM | POA: Diagnosis not present

## 2020-04-07 DIAGNOSIS — M25561 Pain in right knee: Secondary | ICD-10-CM | POA: Diagnosis not present

## 2020-04-07 DIAGNOSIS — Z96651 Presence of right artificial knee joint: Secondary | ICD-10-CM | POA: Diagnosis not present

## 2020-04-08 ENCOUNTER — Other Ambulatory Visit: Payer: Self-pay | Admitting: Family Medicine

## 2020-04-08 DIAGNOSIS — Z1231 Encounter for screening mammogram for malignant neoplasm of breast: Secondary | ICD-10-CM

## 2020-04-11 ENCOUNTER — Ambulatory Visit (INDEPENDENT_AMBULATORY_CARE_PROVIDER_SITE_OTHER): Payer: PPO

## 2020-04-11 ENCOUNTER — Other Ambulatory Visit: Payer: Self-pay

## 2020-04-11 DIAGNOSIS — Z23 Encounter for immunization: Secondary | ICD-10-CM

## 2020-04-12 DIAGNOSIS — M25661 Stiffness of right knee, not elsewhere classified: Secondary | ICD-10-CM | POA: Diagnosis not present

## 2020-04-12 DIAGNOSIS — Z96651 Presence of right artificial knee joint: Secondary | ICD-10-CM | POA: Diagnosis not present

## 2020-04-12 DIAGNOSIS — M6281 Muscle weakness (generalized): Secondary | ICD-10-CM | POA: Diagnosis not present

## 2020-04-12 DIAGNOSIS — M25561 Pain in right knee: Secondary | ICD-10-CM | POA: Diagnosis not present

## 2020-04-14 DIAGNOSIS — M25661 Stiffness of right knee, not elsewhere classified: Secondary | ICD-10-CM | POA: Diagnosis not present

## 2020-04-14 DIAGNOSIS — M25561 Pain in right knee: Secondary | ICD-10-CM | POA: Diagnosis not present

## 2020-04-14 DIAGNOSIS — Z96651 Presence of right artificial knee joint: Secondary | ICD-10-CM | POA: Diagnosis not present

## 2020-04-14 DIAGNOSIS — M6281 Muscle weakness (generalized): Secondary | ICD-10-CM | POA: Diagnosis not present

## 2020-04-19 DIAGNOSIS — M25661 Stiffness of right knee, not elsewhere classified: Secondary | ICD-10-CM | POA: Diagnosis not present

## 2020-04-19 DIAGNOSIS — M6281 Muscle weakness (generalized): Secondary | ICD-10-CM | POA: Diagnosis not present

## 2020-04-19 DIAGNOSIS — M25561 Pain in right knee: Secondary | ICD-10-CM | POA: Diagnosis not present

## 2020-04-19 DIAGNOSIS — Z96651 Presence of right artificial knee joint: Secondary | ICD-10-CM | POA: Diagnosis not present

## 2020-04-21 DIAGNOSIS — M6281 Muscle weakness (generalized): Secondary | ICD-10-CM | POA: Diagnosis not present

## 2020-04-21 DIAGNOSIS — Z96651 Presence of right artificial knee joint: Secondary | ICD-10-CM | POA: Diagnosis not present

## 2020-04-21 DIAGNOSIS — M25661 Stiffness of right knee, not elsewhere classified: Secondary | ICD-10-CM | POA: Diagnosis not present

## 2020-04-21 DIAGNOSIS — M25561 Pain in right knee: Secondary | ICD-10-CM | POA: Diagnosis not present

## 2020-05-17 ENCOUNTER — Ambulatory Visit
Admission: RE | Admit: 2020-05-17 | Discharge: 2020-05-17 | Disposition: A | Discharge status: Home / Self Care | Payer: PPO | Source: Ambulatory Visit | Attending: Family Medicine | Admitting: Family Medicine

## 2020-05-17 ENCOUNTER — Other Ambulatory Visit: Payer: Self-pay

## 2020-05-17 DIAGNOSIS — Z1231 Encounter for screening mammogram for malignant neoplasm of breast: Secondary | ICD-10-CM

## 2020-06-01 ENCOUNTER — Ambulatory Visit (INDEPENDENT_AMBULATORY_CARE_PROVIDER_SITE_OTHER): Payer: PPO

## 2020-06-01 DIAGNOSIS — Z780 Asymptomatic menopausal state: Secondary | ICD-10-CM | POA: Diagnosis not present

## 2020-06-01 DIAGNOSIS — Z0000 Encounter for general adult medical examination without abnormal findings: Secondary | ICD-10-CM

## 2020-06-01 NOTE — Progress Notes (Signed)
Subjective:   Ellen Baker is a 69 y.o. female who presents for Medicare Annual (Subsequent) preventive examination.  Virtual Visit via Telephone Note  I connected with  Ellen Baker on 06/01/20 at 10:00 AM EST by telephone and verified that I am speaking with the correct person using two identifiers.  Location: Patient: home Provider: [facility] Persons participating in the virtual visit: [place]   I discussed the limitations, risks, security and privacy concerns of performing an evaluation and management service by telephone and the availability of in person appointments. The patient expressed understanding and agreed to proceed.  Interactive audio and video telecommunications were attempted between this nurse and patient, however failed, due to patient having technical difficulties OR patient did not have access to video capability.  We continued and completed visit with audio only.  Some vital signs may be absent or patient reported.   Clemetine Marker, LPN    Review of Systems     Cardiac Risk Factors include: advanced age (>81men, >66 women);dyslipidemia;hypertension;obesity (BMI >30kg/m2);sedentary lifestyle     Objective:    There were no vitals filed for this visit. There is no height or weight on file to calculate BMI.  Advanced Directives 06/01/2020 04/05/2020 12/01/2019 12/01/2019 11/25/2019 10/05/2019 06/03/2019  Does Patient Have a Medical Advance Directive? No No No No No No No  Type of Advance Directive - - - - - - -  Does patient want to make changes to medical advance directive? - - - - - - -  Would patient like information on creating a medical advance directive? No - Patient declined No - Patient declined No - Patient declined No - Patient declined No - Patient declined No - Patient declined No - Patient declined  Pre-existing out of facility DNR order (yellow form or pink MOST form) - - - - - - -    Current Medications  (verified) Outpatient Encounter Medications as of 06/01/2020  Medication Sig  . Calcium Carbonate-Vitamin D3 600-400 MG-UNIT TABS Take 1 tablet by mouth daily.  . clopidogrel (PLAVIX) 75 MG tablet Take 1 tablet (75 mg total) by mouth daily.  Marland Kitchen ibuprofen (ADVIL) 200 MG tablet Take by mouth.  . Iron-Vitamin C 65-125 MG TABS Take 1 tablet by mouth 2 (two) times daily.  Marland Kitchen loratadine (CLARITIN) 10 MG tablet Take 1 tablet (10 mg total) by mouth continuous as needed for allergies.  Marland Kitchen losartan (COZAAR) 50 MG tablet TAKE 1 TABLET BY MOUTH DAILY  . Multiple Vitamins-Minerals (CENTRUM SILVER ULTRA WOMENS) TABS Take 1 tablet by mouth daily.  . pantoprazole (PROTONIX) 40 MG tablet Take 1 tablet (40 mg total) by mouth daily.  . simvastatin (ZOCOR) 20 MG tablet Take 1 tablet (20 mg total) by mouth daily.  . vitamin B-12 (CYANOCOBALAMIN) 1000 MCG tablet Take 1,000 mcg by mouth daily.  . [DISCONTINUED] meclizine (ANTIVERT) 25 MG tablet Take 1 tablet (25 mg total) by mouth 3 (three) times daily as needed for dizziness. (Patient not taking: Reported on 02/02/2020)  . [DISCONTINUED] oxyCODONE (OXY IR/ROXICODONE) 5 MG immediate release tablet Take 1-2 tablets (5-10 mg total) by mouth every 4 (four) hours as needed for moderate pain (pain score 4-6). (Patient not taking: Reported on 02/02/2020)  . [DISCONTINUED] traMADol (ULTRAM) 50 MG tablet Take 1 tablet (50 mg total) by mouth every 6 (six) hours as needed for moderate pain. (Patient not taking: Reported on 02/02/2020)   No facility-administered encounter medications on file as of 06/01/2020.  Allergies (verified) Adhesive [tape]   History: Past Medical History:  Diagnosis Date  . Arthritis    right knee  . Carotid artery occlusion   . Dizziness   . GERD (gastroesophageal reflux disease)   . Heart murmur   . History of colon polyps   . Hyperlipidemia   . Hypertension   . Iron deficiency   . Stroke [facility]) 12-31-12   no residual effects   Past Surgical  History:  Procedure Laterality Date  . CAROTID ENDARTERECTOMY     RIGHT  01/19/13  . COLONOSCOPY  08-18-12  . COLONOSCOPY WITH PROPOFOL N/A 03/10/2018   Procedure: COLONOSCOPY WITH PROPOFOL;  Surgeon: Lollie Sails, MD;  Location: [facility] ENDOSCOPY;  Service: Endoscopy;  Laterality: N/A;  . DILATION AND CURETTAGE OF UTERUS    . ENDARTERECTOMY Right 01/19/2013   Procedure: ENDARTERECTOMY CAROTID with patch angioplasty;  Surgeon: Conrad Alden, MD;  Location: [place];  Service: Vascular;  Laterality: Right;  . ESOPHAGEAL DILATION    . JOINT REPLACEMENT Left 04/15/2017  . taotal knee arthorplasty Right 2021  . TOTAL KNEE ARTHROPLASTY Right 12/01/2019   Procedure: TOTAL KNEE ARTHROPLASTY - RNFA;  Surgeon: Corky Mull, MD;  Location: [facility] ORS;  Service: Orthopedics;  Laterality: Right;  . TUBAL LIGATION    . UPPER GI ENDOSCOPY  08-18-12   Family History  Problem Relation Age of Onset  . Stroke Mother   . Deep vein thrombosis Mother   . Heart disease Mother        Amputation-  . Hypertension Mother   . Alzheimer's disease Mother   . Heart failure Father   . Heart disease Father        CHF  . Hypertension Father   . Varicose Veins Father   . Parkinson's disease Father   . Breast cancer Paternal Aunt 19   Social History   Socioeconomic History  . Marital status: Widowed    Spouse name: Not on file  . Number of children: 3  . Years of education: Not on file  . Highest education level: 12th grade  Occupational History    Employer: [employer]  Tobacco Use  . Smoking status: Never Smoker  . Smokeless tobacco: Never Used  . Tobacco comment: Smoking cessation materials not required  Vaping Use  . Vaping Use: Never used  Substance and Sexual Activity  . Alcohol use: No    Alcohol/week: 0.0 standard drinks  . Drug use: No  . Sexual activity: Never  Other Topics Concern  . Not on file  Social History Narrative   Pt lives alone.    Social Determinants of Health   Financial  Resource Strain: Low Risk   . Difficulty of Paying Living Expenses: Not hard at all  Food Insecurity: No Food Insecurity  . Worried About Charity fundraiser in the Last Year: Never true  . Ran Out of Food in the Last Year: Never true  Transportation Needs: No Transportation Needs  . Lack of Transportation (Medical): No  . Lack of Transportation (Non-Medical): No  Physical Activity: Inactive  . Days of Exercise per Week: 0 days  . Minutes of Exercise per Session: 0 min  Stress: No Stress Concern Present  . Feeling of Stress : Not at all  Social Connections: Moderately Isolated  . Frequency of Communication with Friends and Family: More than three times a week  . Frequency of Social Gatherings with Friends and Family: Three times a week  . Attends Religious  Services: More than 4 times per year  . Active Member of Clubs or Organizations: No  . Attends Archivist Meetings: Never  . Marital Status: Widowed    Tobacco Counseling Counseling given: Not Answered Comment: Smoking cessation materials not required   Clinical Intake:  Pre-visit preparation completed: Yes  Pain : No/denies pain     Nutritional Risks: None Diabetes: No  How often do you need to have someone help you when you read instructions, pamphlets, or other written materials from your doctor or pharmacy?: 1 - Never    Interpreter Needed?: No  Information entered by :: Clemetine Marker LPN   Activities of Daily Living In your present state of health, do you have any difficulty performing the following activities: 06/01/2020 12/01/2019  Hearing? N N  Comment declines hearing aids -  Vision? N N  Difficulty concentrating or making decisions? N N  Walking or climbing stairs? N Y  Dressing or bathing? N N  Doing errands, shopping? N N  Preparing Food and eating ? N -  Using the Toilet? N -  In the past six months, have you accidently leaked urine? N -  Do you have problems with loss of bowel control?  N -  Managing your Medications? N -  Managing your Finances? N -  Housekeeping or managing your Housekeeping? N -  Some recent data might be hidden    Patient Care Team: Juline Patch, MD as PCP - General (Family Medicine) Lollie Sails, MD (Inactive) as Consulting Physician (Gastroenterology) Reche Dixon, PA-C (Orthopedic Surgery) Leanor Kail, MD (Inactive) as Consulting Physician (Orthopedic Surgery) Ammie Dalton, Okey Regal, MD as Consulting Physician (Unknown Physician Specialty) Juline Patch, MD (Family Medicine)  Indicate any recent Medical Services you may have received from other than [facility] providers in the past year (date may be approximate).     Assessment:   This is a routine wellness examination for Ellen Baker.  Hearing/Vision screen  Hearing Screening   125Hz  250Hz  500Hz  1000Hz  2000Hz  3000Hz  4000Hz  6000Hz  8000Hz   Right ear:           Left ear:           Comments: Pt denies hearing difficulty  Vision Screening Comments: [facility] screenings [facility]  Dietary issues and exercise activities discussed: Current Exercise Habits: The patient does not participate in regular exercise at present, Exercise limited by: orthopedic condition(s)  Goals    . DIET - INCREASE WATER INTAKE     Recommend to drink at least 6-8 8oz glasses of water per day.      Depression Screen PHQ 2/9 Scores 06/01/2020 01/01/2020 06/16/2019 06/06/2018 12/09/2017 12/05/2017 12/04/2016  PHQ - 2 Score 0 0 0 0 0 0 0  PHQ- 9 Score - 4 0 0 0 - -    Fall Risk Fall Risk  06/01/2020 01/01/2020 06/16/2019 05/13/2019 06/06/2018  Falls in the past year? 1 0 0 0 0  Comment - - - Emmi Telephone Survey: data to providers prior to load -  Number falls in past yr: 0 - - - -  Injury with Fall? 0 - - - -  Risk for fall due to : No Fall Risks - - - -  Risk for fall due to: Comment - - - - -  Follow up Falls prevention discussed Falls evaluation completed Falls evaluation completed -  Falls evaluation completed    [place]:  Any stairs  in or around the home? Yes  If so, are there any without handrails? No  Home free of loose throw rugs in walkways, pet beds, electrical cords, etc? Yes  Adequate lighting in your home to reduce risk of falls? Yes   ASSISTIVE DEVICES UTILIZED TO PREVENT FALLS:  Life alert? No  Use of a cane, walker or w/c? No  Grab bars in the bathroom? No  Shower chair or bench in shower? Yes  Elevated toilet seat or a handicapped toilet? No   TIMED UP AND GO:  Was the test performed? No . Telephonic visit.  Cognitive Function: Normal cognitive status assessed by direct observation by this Nurse Health Advisor. No abnormalities found.       6CIT Screen 12/09/2017  What Year? 0 points  What month? 0 points  What time? 0 points  Count back from 20 0 points  Months in reverse 0 points  Repeat phrase 0 points  Total Score 0    Immunizations Immunization History  Administered Date(s) Administered  . Fluad Quad(high Dose 65+) 05/05/2019, 04/11/2020  . Influenza, High Dose Seasonal PF 03/31/2018  . PFIZER SARS-COV-2 Vaccination 08/11/2019, 09/01/2019, 04/27/2020  . Pneumococcal Conjugate-13 06/05/2016  . Pneumococcal Polysaccharide-23 12/05/2017    TDAP status: Due, Education has been provided regarding the importance of this vaccine. Advised may receive this vaccine at local pharmacy or Health Dept. Aware to provide a copy of the vaccination record if obtained from local pharmacy or Health Dept. Verbalized acceptance and understanding.  Flu Vaccine status: Up to date  Pneumococcal vaccine status: Up to date  Covid-19 vaccine status: Completed vaccines  Qualifies for Shingles Vaccine? Yes   Zostavax completed No   Shingrix Completed?: No.    Education has been provided regarding the importance of this vaccine. Patient has been advised to call insurance company to determine out of pocket expense if  they have not yet received this vaccine. Advised may also receive vaccine at local pharmacy or Health Dept. Verbalized acceptance and understanding.  Screening Tests Health Maintenance  Topic Date Due  . TETANUS/TDAP  06/15/2020 (Originally 02/24/1970)  . Hepatitis C Screening  06/15/2020 (Originally 12/02/1950)  . MAMMOGRAM  05/17/2022  . COLONOSCOPY  03/11/2023  . INFLUENZA VACCINE  Completed  . DEXA SCAN  Completed  . COVID-19 Vaccine  Completed  . PNA vac Low Risk Adult  Completed    Health Maintenance  There are no preventive care reminders to display for this patient.  Colorectal cancer screening: Type of screening: Colonoscopy. Completed 03/10/18. Repeat every 10 years  Mammogram status: Completed 05/17/20. Repeat every year  Bone Density status: Completed 01/09/18. Results reflect: Bone density results: OSTEOPOROSIS. Repeat every 2 years.  Lung Cancer Screening: (Low Dose CT Chest recommended if Age 30-80 years, 30 pack-year currently smoking OR have quit w/in 15years.) does not qualify.   Additional Screening:  Hepatitis C Screening: does qualify; postponed  Vision Screening: Recommended annual ophthalmology exams for early detection of glaucoma and other disorders of the eye. Is the patient up to date with their annual eye exam?  Yes  Who is the provider or what is the name of the office in which the patient attends annual eye exams? [place] Screening: Recommended annual dental exams for proper oral hygiene  Community Resource Referral / Chronic Care Management: CRR required this visit?  No   CCM required this visit?  No      Plan:     I have personally reviewed  and noted the following in the patient's chart:   . Medical and social history . Use of alcohol, tobacco or illicit drugs  . Current medications and supplements . Functional ability and status . Nutritional status . Physical activity . Advanced directives . List of other  physicians . Hospitalizations, surgeries, and ER visits in previous 12 months . Vitals . Screenings to include cognitive, depression, and falls . Referrals and appointments  In addition, I have reviewed and discussed with patient certain preventive protocols, quality metrics, and best practice recommendations. A written personalized care plan for preventive services as well as general preventive health recommendations were provided to patient.     Clemetine Marker, LPN   17/05/7870   Nurse Notes: none

## 2020-06-01 NOTE — Patient Instructions (Signed)
Ellen Baker , Thank you for taking time to come for your Medicare Wellness Visit. I appreciate your ongoing commitment to your health goals. Please review the following plan we discussed and let me know if I can assist you in the future.   Screening recommendations/referrals: Colonoscopy: done 03/10/18. Repeat in 2024. Mammogram: done 05/17/20 Bone Density: done 01/09/18 Recommended yearly ophthalmology/optometry visit for glaucoma screening and checkup Recommended yearly dental visit for hygiene and checkup  Vaccinations: Influenza vaccine: done 04/11/20 Pneumococcal vaccine: done 12/05/17 Tdap vaccine: due Shingles vaccine: Shingrix discussed. Please contact your pharmacy for coverage information.  Covid-19: done 08/11/19, 09/01/19 & 04/27/20  Advanced directives: Advance directive discussed with you today. Even though you declined this today please call our office should you change your mind and we can give you the proper paperwork for you to fill out.  Conditions/risks identified: Recommend drinking 6-8 glasses of water per day   Next appointment: Follow up in one year for your annual wellness visit    Preventive Care 65 Years and Older, Female Preventive care refers to lifestyle choices and visits with your health care provider that can promote health and wellness. What does preventive care include?  A yearly physical exam. This is also called an annual well check.  Dental exams once or twice a year.  Routine eye exams. Ask your health care provider how often you should have your eyes checked.  Personal lifestyle choices, including:  Daily care of your teeth and gums.  Regular physical activity.  Eating a healthy diet.  Avoiding tobacco and drug use.  Limiting alcohol use.  Practicing safe sex.  Taking low-dose aspirin every day.  Taking vitamin and mineral supplements as recommended by your health care provider. What happens during an annual well check? The services  and screenings done by your health care provider during your annual well check will depend on your age, overall health, lifestyle risk factors, and family history of disease. Counseling  Your health care provider may ask you questions about your:  Alcohol use.  Tobacco use.  Drug use.  Emotional well-being.  Home and relationship well-being.  Sexual activity.  Eating habits.  History of falls.  Memory and ability to understand (cognition).  Work and work Statistician.  Reproductive health. Screening  You may have the following tests or measurements:  Height, weight, and BMI.  Blood pressure.  Lipid and cholesterol levels. These may be checked every 5 years, or more frequently if you are over 75 years old.  Skin check.  Lung cancer screening. You may have this screening every year starting at age 69 if you have a 30-pack-year history of smoking and currently smoke or have quit within the past 15 years.  Fecal occult blood test (FOBT) of the stool. You may have this test every year starting at age 32.  Flexible sigmoidoscopy or colonoscopy. You may have a sigmoidoscopy every 5 years or a colonoscopy every 10 years starting at age 44.  Hepatitis C blood test.  Hepatitis B blood test.  Sexually transmitted disease (STD) testing.  Diabetes screening. This is done by checking your blood sugar (glucose) after you have not eaten for a while (fasting). You may have this done every 1-3 years.  Bone density scan. This is done to screen for osteoporosis. You may have this done starting at age 10.  Mammogram. This may be done every 1-2 years. Talk to your health care provider about how often you should have regular mammograms. Talk with your  health care provider about your test results, treatment options, and if necessary, the need for more tests. Vaccines  Your health care provider may recommend certain vaccines, such as:  Influenza vaccine. This is recommended every  year.  Tetanus, diphtheria, and acellular pertussis (Tdap, Td) vaccine. You may need a Td booster every 10 years.  Zoster vaccine. You may need this after age 62.  Pneumococcal 13-valent conjugate (PCV13) vaccine. One dose is recommended after age 55.  Pneumococcal polysaccharide (PPSV23) vaccine. One dose is recommended after age 76. Talk to your health care provider about which screenings and vaccines you need and how often you need them. This information is not intended to replace advice given to you by your health care provider. Make sure you discuss any questions you have with your health care provider. Document Released: 07/01/2015 Document Revised: 02/22/2016 Document Reviewed: 04/05/2015 Elsevier Interactive Patient Education  2017 [place] Prevention in the Home Falls can cause injuries. They can happen to people of all ages. There are many things you can do to make your home safe and to help prevent falls. What can I do on the outside of my home?  Regularly fix the edges of walkways and driveways and fix any cracks.  Remove anything that might make you trip as you walk through a door, such as a raised step or threshold.  Trim any bushes or trees on the path to your home.  Use bright outdoor lighting.  Clear any walking paths of anything that might make someone trip, such as rocks or tools.  Regularly check to see if handrails are loose or broken. Make sure that both sides of any steps have handrails.  Any raised decks and porches should have guardrails on the edges.  Have any leaves, snow, or ice cleared regularly.  Use sand or salt on walking paths during winter.  Clean up any spills in your garage right away. This includes oil or grease spills. What can I do in the bathroom?  Use night lights.  Install grab bars by the toilet and in the tub and shower. Do not use towel bars as grab bars.  Use non-skid mats or decals in the tub or shower.  If you  need to sit down in the shower, use a plastic, non-slip stool.  Keep the floor dry. Clean up any water that spills on the floor as soon as it happens.  Remove soap buildup in the tub or shower regularly.  Attach bath mats securely with double-sided non-slip rug tape.  Do not have throw rugs and other things on the floor that can make you trip. What can I do in the bedroom?  Use night lights.  Make sure that you have a light by your bed that is easy to reach.  Do not use any sheets or blankets that are too big for your bed. They should not hang down onto the floor.  Have a firm chair that has side arms. You can use this for support while you get dressed.  Do not have throw rugs and other things on the floor that can make you trip. What can I do in the kitchen?  Clean up any spills right away.  Avoid walking on wet floors.  Keep items that you use a lot in easy-to-reach places.  If you need to reach something above you, use a strong step stool that has a grab bar.  Keep electrical cords out of the way.  Do not use  floor polish or wax that makes floors slippery. If you must use wax, use non-skid floor wax.  Do not have throw rugs and other things on the floor that can make you trip. What can I do with my stairs?  Do not leave any items on the stairs.  Make sure that there are handrails on both sides of the stairs and use them. Fix handrails that are broken or loose. Make sure that handrails are as long as the stairways.  Check any carpeting to make sure that it is firmly attached to the stairs. Fix any carpet that is loose or worn.  Avoid having throw rugs at the top or bottom of the stairs. If you do have throw rugs, attach them to the floor with carpet tape.  Make sure that you have a light switch at the top of the stairs and the bottom of the stairs. If you do not have them, ask someone to add them for you. What else can I do to help prevent falls?  Wear shoes  that:  Do not have high heels.  Have rubber bottoms.  Are comfortable and fit you well.  Are closed at the toe. Do not wear sandals.  If you use a stepladder:  Make sure that it is fully opened. Do not climb a closed stepladder.  Make sure that both sides of the stepladder are locked into place.  Ask someone to hold it for you, if possible.  Clearly mark and make sure that you can see:  Any grab bars or handrails.  First and last steps.  Where the edge of each step is.  Use tools that help you move around (mobility aids) if they are needed. These include:  Canes.  Walkers.  Scooters.  Crutches.  Turn on the lights when you go into a dark area. Replace any light bulbs as soon as they burn out.  Set up your furniture so you have a clear path. Avoid moving your furniture around.  If any of your floors are uneven, fix them.  If there are any pets around you, be aware of where they are.  Review your medicines with your doctor. Some medicines can make you feel dizzy. This can increase your chance of falling. Ask your doctor what other things that you can do to help prevent falls. This information is not intended to replace advice given to you by your health care provider. Make sure you discuss any questions you have with your health care provider. Document Released: 03/31/2009 Document Revised: 11/10/2015 Document Reviewed: 07/09/2014 Elsevier Interactive Patient Education  2017 [employer].

## 2020-06-07 ENCOUNTER — Other Ambulatory Visit: Payer: Self-pay

## 2020-06-07 ENCOUNTER — Telehealth: Payer: Self-pay

## 2020-06-07 ENCOUNTER — Inpatient Hospital Stay: Payer: PPO | Attending: Hematology and Oncology

## 2020-06-07 DIAGNOSIS — E538 Deficiency of other specified B group vitamins: Secondary | ICD-10-CM | POA: Diagnosis not present

## 2020-06-07 DIAGNOSIS — D649 Anemia, unspecified: Secondary | ICD-10-CM

## 2020-06-07 DIAGNOSIS — D509 Iron deficiency anemia, unspecified: Secondary | ICD-10-CM | POA: Diagnosis not present

## 2020-06-07 LAB — CBC WITH DIFFERENTIAL/PLATELET
Abs Immature Granulocytes: 0.02 10*3/uL (ref 0.00–0.07)
Basophils Absolute: 0 10*3/uL (ref 0.0–0.1)
Basophils Relative: 1 %
Eosinophils Absolute: 0.2 10*3/uL (ref 0.0–0.5)
Eosinophils Relative: 4 %
HCT: 35.4 % — ABNORMAL LOW (ref 36.0–46.0)
Hemoglobin: 11.7 g/dL — ABNORMAL LOW (ref 12.0–15.0)
Immature Granulocytes: 0 %
Lymphocytes Relative: 22 %
Lymphs Abs: 1.1 10*3/uL (ref 0.7–4.0)
MCH: 30.3 pg (ref 26.0–34.0)
MCHC: 33.1 g/dL (ref 30.0–36.0)
MCV: 91.7 fL (ref 80.0–100.0)
Monocytes Absolute: 0.4 10*3/uL (ref 0.1–1.0)
Monocytes Relative: 7 %
Neutro Abs: 3.2 10*3/uL (ref 1.7–7.7)
Neutrophils Relative %: 66 %
Platelets: 257 10*3/uL (ref 150–400)
RBC: 3.86 MIL/uL — ABNORMAL LOW (ref 3.87–5.11)
RDW: 12.6 % (ref 11.5–15.5)
WBC: 4.9 10*3/uL (ref 4.0–10.5)
nRBC: 0 % (ref 0.0–0.2)

## 2020-06-07 LAB — FOLATE: Folate: 36 ng/mL (ref >5.9)

## 2020-06-07 LAB — FERRITIN: Ferritin: 30 ng/mL (ref 11–307)

## 2020-06-07 LAB — VITAMIN B12: Vitamin B-12: 1692 pg/mL — ABNORMAL HIGH (ref 180–914)

## 2020-06-07 NOTE — Telephone Encounter (Signed)
Left message for patient to call back  

## 2020-06-07 NOTE — Telephone Encounter (Signed)
-----   Message from Lequita Asal, MD sent at 06/07/2020  3:26 PM EST ----- Regarding: Please call patient  I believe she is tapering her oral iron.  Ferritin has decreased.  Hemoglobin has decreased.  M  ----- Message ----- From: Buel Ream, Lab In [place] Sent: 06/07/2020  11:11 AM EST To: Lequita Asal, MD

## 2020-06-08 ENCOUNTER — Other Ambulatory Visit: Payer: Self-pay

## 2020-06-08 ENCOUNTER — Telehealth: Payer: Self-pay

## 2020-06-08 DIAGNOSIS — E538 Deficiency of other specified B group vitamins: Secondary | ICD-10-CM

## 2020-06-08 NOTE — Telephone Encounter (Signed)
Good Afternoon,  B-12 is scheduled for 07/07/20. Done

## 2020-06-08 NOTE — Telephone Encounter (Signed)
Patient aware. Lab order in. Scheduler to call and make lab appt.

## 2020-06-08 NOTE — Telephone Encounter (Signed)
-----   Message from Lequita Asal, MD sent at 06/08/2020 10:11 AM EST ----- Regarding: RE: Please call patient  Decrease B12 to 1000 mcg 3 days/week (Mon, Wed, Fri).  Recheck B12 in 1 month.  M  ----- Message ----- From: Drue Dun, RN Sent: 06/08/2020   9:40 AM EST To: Lequita Asal, MD, Vito Berger, CMA Subject: RE: Please call patient                        1065mcg a day ----- Message ----- From: Lequita Asal, MD Sent: 06/08/2020   8:28 AM EST To: Vito Berger, CMA, Brittnay Pigman Eusebio Me, RN Subject: Please call patient                             B12 level is a little high.  How much B12 is she taking?  M  ----- Message ----- From: Buel Ream, [facility] [place] Sent: 06/07/2020  11:11 AM EST To: Lequita Asal, MD

## 2020-06-09 ENCOUNTER — Telehealth: Payer: Self-pay

## 2020-06-09 NOTE — Telephone Encounter (Signed)
I called patient that to talk to her about her b12 levels. She stated Ellen Baker already called her.

## 2020-06-15 ENCOUNTER — Other Ambulatory Visit: Payer: PPO

## 2020-06-15 DIAGNOSIS — Z96651 Presence of right artificial knee joint: Secondary | ICD-10-CM | POA: Diagnosis not present

## 2020-06-15 DIAGNOSIS — M1711 Unilateral primary osteoarthritis, right knee: Secondary | ICD-10-CM | POA: Diagnosis not present

## 2020-06-15 DIAGNOSIS — M25361 Other instability, right knee: Secondary | ICD-10-CM | POA: Diagnosis not present

## 2020-06-20 ENCOUNTER — Other Ambulatory Visit: Payer: Self-pay

## 2020-06-20 ENCOUNTER — Ambulatory Visit
Admission: RE | Admit: 2020-06-20 | Discharge: 2020-06-20 | Disposition: A | Discharge status: Home / Self Care | Payer: PPO | Source: Ambulatory Visit | Attending: Family Medicine | Admitting: Family Medicine

## 2020-06-20 DIAGNOSIS — Z780 Asymptomatic menopausal state: Secondary | ICD-10-CM | POA: Diagnosis not present

## 2020-06-20 DIAGNOSIS — R29890 Loss of height: Secondary | ICD-10-CM | POA: Diagnosis not present

## 2020-06-20 DIAGNOSIS — M85852 Other specified disorders of bone density and structure, left thigh: Secondary | ICD-10-CM | POA: Diagnosis not present

## 2020-06-21 ENCOUNTER — Other Ambulatory Visit: Payer: Self-pay

## 2020-06-21 ENCOUNTER — Telehealth: Payer: Self-pay

## 2020-06-21 DIAGNOSIS — M816 Localized osteoporosis [Lequesne]: Secondary | ICD-10-CM

## 2020-06-21 DIAGNOSIS — K219 Gastro-esophageal reflux disease without esophagitis: Secondary | ICD-10-CM

## 2020-06-21 MED ORDER — ALENDRONATE SODIUM 70 MG PO TABS: 70.0000 mg | ORAL_TABLET | ORAL | 0 refills | Status: DC

## 2020-06-21 MED ORDER — FAMOTIDINE 20 MG PO TABS: 20.0000 mg | ORAL_TABLET | Freq: Two times a day (BID) | ORAL | 1 refills | Status: DC

## 2020-06-21 NOTE — Progress Notes (Signed)
Sent in famotidine in place on pantoprazole due to results on bone density

## 2020-06-21 NOTE — Telephone Encounter (Unsigned)
Copied from CRM [id number]. Topic: General - Other >> Jun 21, 2020  2:14 PM Lyn Hollingshead D wrote: PT returning call from Delice Bison / please advise

## 2020-06-30 ENCOUNTER — Other Ambulatory Visit: Payer: Self-pay | Admitting: Family Medicine

## 2020-06-30 DIAGNOSIS — E785 Hyperlipidemia, unspecified: Secondary | ICD-10-CM

## 2020-07-01 ENCOUNTER — Other Ambulatory Visit: Payer: Self-pay | Admitting: Family Medicine

## 2020-07-01 DIAGNOSIS — I10 Essential (primary) hypertension: Secondary | ICD-10-CM

## 2020-07-04 ENCOUNTER — Ambulatory Visit: Payer: PPO | Admitting: Family Medicine

## 2020-07-07 ENCOUNTER — Other Ambulatory Visit: Payer: Self-pay

## 2020-07-07 ENCOUNTER — Inpatient Hospital Stay: Payer: PPO | Attending: Hematology and Oncology

## 2020-07-07 DIAGNOSIS — E538 Deficiency of other specified B group vitamins: Secondary | ICD-10-CM | POA: Insufficient documentation

## 2020-07-07 LAB — VITAMIN B12: Vitamin B-12: 1248 pg/mL — ABNORMAL HIGH (ref 180–914)

## 2020-07-18 ENCOUNTER — Other Ambulatory Visit: Payer: Self-pay | Admitting: Family Medicine

## 2020-07-18 DIAGNOSIS — M816 Localized osteoporosis [Lequesne]: Secondary | ICD-10-CM

## 2020-07-19 ENCOUNTER — Encounter: Payer: Self-pay | Admitting: Family Medicine

## 2020-07-19 ENCOUNTER — Other Ambulatory Visit: Payer: Self-pay

## 2020-07-19 ENCOUNTER — Ambulatory Visit (INDEPENDENT_AMBULATORY_CARE_PROVIDER_SITE_OTHER): Payer: PPO | Admitting: Family Medicine

## 2020-07-19 VITALS — BP 132/70 | HR 72 | Ht 64.0 in | Wt 204.0 lb

## 2020-07-19 DIAGNOSIS — K219 Gastro-esophageal reflux disease without esophagitis: Secondary | ICD-10-CM | POA: Diagnosis not present

## 2020-07-19 DIAGNOSIS — Z23 Encounter for immunization: Secondary | ICD-10-CM | POA: Diagnosis not present

## 2020-07-19 DIAGNOSIS — I10 Essential (primary) hypertension: Secondary | ICD-10-CM

## 2020-07-19 DIAGNOSIS — M816 Localized osteoporosis [Lequesne]: Secondary | ICD-10-CM | POA: Diagnosis not present

## 2020-07-19 DIAGNOSIS — E785 Hyperlipidemia, unspecified: Secondary | ICD-10-CM

## 2020-07-19 MED ORDER — SIMVASTATIN 20 MG PO TABS: ORAL_TABLET | ORAL | 0 refills | Status: DC

## 2020-07-19 MED ORDER — LOSARTAN POTASSIUM 50 MG PO TABS: 50.0000 mg | ORAL_TABLET | Freq: Every day | ORAL | 0 refills | Status: DC

## 2020-07-19 MED ORDER — ALENDRONATE SODIUM 70 MG PO TABS: ORAL_TABLET | ORAL | 1 refills | Status: DC

## 2020-07-19 MED ORDER — FAMOTIDINE 20 MG PO TABS: 20.0000 mg | ORAL_TABLET | Freq: Two times a day (BID) | ORAL | 1 refills | Status: DC

## 2020-07-19 MED ORDER — CLOPIDOGREL BISULFATE 75 MG PO TABS: ORAL_TABLET | ORAL | 0 refills | Status: DC

## 2020-07-19 NOTE — Progress Notes (Signed)
Date:  07/19/2020   Name:  Ellen Baker   DOB:  1950-08-09   MRN:  [id number]   Chief Complaint: Gastroesophageal Reflux, Hyperlipidemia, Hypertension, Osteoporosis, Cerebrovascular Accident, and tdap vaccine  Gastroesophageal Reflux She reports no abdominal pain, no belching, no chest pain, no choking, no coughing, no dysphagia, no early satiety, no globus sensation, no heartburn, no hoarse voice, no nausea, no sore throat, no stridor, no tooth decay, no water brash or no wheezing. hx of cerebral vascular disease. This is a chronic problem. The current episode started more than 1 year ago. The problem occurs rarely. The problem has been gradually improving. Pertinent negatives include no anemia, fatigue, melena, muscle weakness, orthopnea or weight loss. There are no known risk factors. She has tried a histamine-2 antagonist for the symptoms. The treatment provided moderate relief. Past procedures do not include an abdominal ultrasound, an EGD, esophageal manometry, esophageal pH monitoring, H. pylori antibody titer or a UGI.  Hyperlipidemia This is a chronic problem. The current episode started more than 1 year ago. The problem is controlled. Recent lipid tests were reviewed and are normal. She has no history of chronic renal disease, diabetes, hypothyroidism, obesity or nephrotic syndrome. Pertinent negatives include no chest pain, focal sensory loss, focal weakness, leg pain, myalgias or shortness of breath. Current antihyperlipidemic treatment includes statins. The current treatment provides moderate improvement of lipids. There are no compliance problems.   Hypertension This is a chronic problem. The current episode started more than 1 year ago. The problem has been gradually improving since onset. The problem is controlled. Pertinent negatives include no anxiety, blurred vision, chest pain, headaches, malaise/fatigue, neck pain, orthopnea, palpitations, peripheral edema, PND, shortness of  breath or sweats. There are no associated agents to hypertension. There are no known risk factors for coronary artery disease. Past treatments include angiotensin blockers. The current treatment provides moderate improvement. There are no compliance problems.  There is no history of angina, kidney disease, CAD/MI, CVA, heart failure, left ventricular hypertrophy, PVD or retinopathy. There is no history of chronic renal disease, a hypertension causing med or renovascular disease.  Cerebrovascular Accident This is a chronic problem. The current episode started more than 1 year ago. The problem occurs daily. The problem has been gradually improving. Pertinent negatives include no abdominal pain, anorexia, arthralgias, change in bowel habit, chest pain, chills, congestion, coughing, diaphoresis, fatigue, fever, headaches, joint swelling, myalgias, nausea, neck pain, numbness, rash, sore throat, swollen glands, urinary symptoms, vertigo, visual change, vomiting or weakness.  Neurologic Problem The patient's pertinent negatives include no altered mental status, clumsiness, focal sensory loss, focal weakness, loss of balance, memory loss, near-syncope, slurred speech, syncope, visual change or weakness. Primary symptoms comment: hx of cerebral vascular disease. The problem has been rapidly improving since onset. There was no focality noted. Pertinent negatives include no abdominal pain, back pain, chest pain, diaphoresis, dizziness, fatigue, fever, headaches, light-headedness, nausea, neck pain, palpitations, shortness of breath, vertigo or vomiting. Treatments tried: plavis. Her past medical history is significant for a CVA.  URI  This is a chronic problem. The current episode started more than 1 year ago. The fever has been present for less than 1 day. Pertinent negatives include no abdominal pain, chest pain, congestion, coughing, diarrhea, dysuria, ear pain, headaches, nausea, neck pain, rash, rhinorrhea,  sneezing, sore throat, swollen glands, vomiting or wheezing.    Lab Results  Component Value Date   CREATININE 0.64 12/03/2019   BUN 7 (L) 12/03/2019  NA 137 12/03/2019   K 3.9 12/03/2019   CL 99 12/03/2019   CO2 28 12/03/2019   Lab Results  Component Value Date   CHOL 168 01/01/2020   HDL 63 01/01/2020   LDLCALC 86 01/01/2020   TRIG 106 01/01/2020   CHOLHDL 2.6 12/05/2017   Lab Results  Component Value Date   TSH 1.757 07/21/2018   Lab Results  Component Value Date   HGBA1C 5.3 01/01/2013   Lab Results  Component Value Date   WBC 4.9 06/07/2020   HGB 11.7 (L) 06/07/2020   HCT 35.4 (L) 06/07/2020   MCV 91.7 06/07/2020   PLT 257 06/07/2020   Lab Results  Component Value Date   ALT 16 11/27/2019   AST 22 11/27/2019   ALKPHOS 67 11/27/2019   BILITOT 1.2 11/27/2019     Review of Systems  Constitutional: Negative.  Negative for chills, diaphoresis, fatigue, fever, malaise/fatigue, unexpected weight change and weight loss.  HENT: Negative for congestion, ear discharge, ear pain, hoarse voice, rhinorrhea, sinus pressure, sneezing and sore throat.   Eyes: Negative for blurred vision, double vision, photophobia, pain, discharge, redness and itching.  Respiratory: Negative for cough, choking, shortness of breath, wheezing and stridor.   Cardiovascular: Negative for chest pain, palpitations, orthopnea, PND and near-syncope.  Gastrointestinal: Negative for abdominal pain, anorexia, blood in stool, change in bowel habit, constipation, diarrhea, dysphagia, heartburn, melena, nausea and vomiting.  Endocrine: Negative for cold intolerance, heat intolerance, polydipsia, polyphagia and polyuria.  Genitourinary: Negative for dysuria, flank pain, frequency, hematuria, menstrual problem, pelvic pain, urgency, vaginal bleeding and vaginal discharge.  Musculoskeletal: Negative for arthralgias, back pain, joint swelling, myalgias, muscle weakness and neck pain.  Skin: Negative for  rash.  Allergic/Immunologic: Negative for environmental allergies and food allergies.  Neurological: Negative for dizziness, vertigo, focal weakness, syncope, weakness, light-headedness, numbness, headaches and loss of balance.  Hematological: Negative for adenopathy. Does not bruise/bleed easily.  Psychiatric/Behavioral: Negative for dysphoric mood and memory loss. The patient is not nervous/anxious.     Patient Active Problem List   Diagnosis Date Noted  . Status post total knee replacement using cement, right 12/01/2019  . B12 deficiency 09/24/2018  . Normocytic anemia 07/20/2018  . Primary osteoarthritis of right knee 02/04/2018  . Status post total knee replacement using cement, left 02/04/2018  . Obesity (BMI 35.0-39.9 without comorbidity) 01/18/2017  . Primary osteoarthritis of left knee 01/18/2017  . Cerebral vascular insufficiency 06/06/2015  . Dyslipidemia 06/06/2015  . Aftercare following surgery of the circulatory system, [place] 08/03/2013  . Occlusion and stenosis of carotid artery without mention of cerebral infarction 01/16/2013  . Pre-operative cardiovascular examination 01/16/2013  . Carotid artery stenosis, symptomatic 01/02/2013  . Occlusion and stenosis of unspecified carotid artery 01/02/2013  . CVA (cerebral infarction) 12/31/2012  . HTN (hypertension) 12/31/2012  . GERD (gastroesophageal reflux disease) 12/31/2012  . Cerebral artery occlusion with cerebral infarction ([place]) 12/31/2012  . Cerebral infarction ([place]) 12/31/2012  . Esophageal reflux 12/31/2012  . Essential hypertension 12/31/2012    Allergies  Allergen Reactions  . Adhesive [Tape] Rash    Skin became RAW    Past Surgical History:  Procedure Laterality Date  . CAROTID ENDARTERECTOMY     RIGHT  01/19/13  . COLONOSCOPY  08-18-12  . COLONOSCOPY WITH PROPOFOL N/A 03/10/2018   Procedure: COLONOSCOPY WITH PROPOFOL;  Surgeon: Lollie Sails, MD;  Location: [facility] ENDOSCOPY;  Service: Endoscopy;   Laterality: N/A;  . DILATION AND CURETTAGE OF UTERUS    .  ENDARTERECTOMY Right 01/19/2013   Procedure: ENDARTERECTOMY CAROTID with patch angioplasty;  Surgeon: Conrad Lamar, MD;  Location: [place];  Service: Vascular;  Laterality: Right;  . ESOPHAGEAL DILATION    . JOINT REPLACEMENT Left 04/15/2017  . taotal knee arthorplasty Right 2021  . TOTAL KNEE ARTHROPLASTY Right 12/01/2019   Procedure: TOTAL KNEE ARTHROPLASTY - RNFA;  Surgeon: Corky Mull, MD;  Location: [facility] ORS;  Service: Orthopedics;  Laterality: Right;  . TUBAL LIGATION    . UPPER GI ENDOSCOPY  08-18-12    Social History   Tobacco Use  . Smoking status: Never Smoker  . Smokeless tobacco: Never Used  . Tobacco comment: Smoking cessation materials not required  Vaping Use  . Vaping Use: Never used  Substance Use Topics  . Alcohol use: No    Alcohol/week: 0.0 standard drinks  . Drug use: No     Medication list has been reviewed and updated.  Current Meds  Medication Sig  . alendronate (FOSAMAX) 70 MG tablet TAKE 1 TABLET(70 MG) BY MOUTH 1 TIME A WEEK WITH A FULL GLASS OF WATER AND ON AN EMPTY STOMACH  . Calcium Carbonate-Vitamin D3 600-400 MG-UNIT TABS Take 1 tablet by mouth daily.  . clopidogrel (PLAVIX) 75 MG tablet TAKE 1 TABLET(75 MG) BY MOUTH DAILY  . famotidine (PEPCID) 20 MG tablet Take 1 tablet (20 mg total) by mouth 2 (two) times daily.  Marland Kitchen ibuprofen (ADVIL) 200 MG tablet Take by mouth.  . Iron-Vitamin C 65-125 MG TABS Take 1 tablet by mouth 2 (two) times daily.  Marland Kitchen loratadine (CLARITIN) 10 MG tablet Take 1 tablet (10 mg total) by mouth continuous as needed for allergies.  Marland Kitchen losartan (COZAAR) 50 MG tablet TAKE 1 TABLET BY MOUTH DAILY  . Multiple Vitamins-Minerals (CENTRUM SILVER ULTRA WOMENS) TABS Take 1 tablet by mouth daily.  . simvastatin (ZOCOR) 20 MG tablet TAKE 1 TABLET(20 MG) BY MOUTH DAILY  . vitamin B-12 (CYANOCOBALAMIN) 1000 MCG tablet Take 1,000 mcg by mouth 3 (three) times a week.    PHQ 2/9 Scores  07/19/2020 06/01/2020 01/01/2020 06/16/2019  PHQ - 2 Score 0 0 0 0  PHQ- 9 Score 0 - 4 0    GAD 7 : Generalized Anxiety Score 01/01/2020 06/16/2019  Nervous, Anxious, on Edge 0 0  Control/stop worrying 0 0  Worry too much - different things 0 0  Trouble relaxing 0 0  Restless 0 0  Easily annoyed or irritable 0 0  Afraid - awful might happen 0 0  Total GAD 7 Score 0 0    BP Readings from Last 3 Encounters:  07/19/20 132/70  04/05/20 (!) 158/76  02/23/20 135/71    Physical Exam Vitals and nursing note reviewed.  Constitutional:      General: She is not in acute distress.    Appearance: She is not diaphoretic.  HENT:     Head: Normocephalic and atraumatic.     Right Ear: Tympanic membrane, ear canal and external ear normal. There is no impacted cerumen.     Left Ear: Tympanic membrane, ear canal and external ear normal. There is no impacted cerumen.     Nose: Nose normal. No congestion or rhinorrhea.     Mouth/Throat:     Mouth: Oropharynx is clear and moist.  Eyes:     General:        Right eye: No discharge.        Left eye: No discharge.     Extraocular Movements: EOM  normal.     Conjunctiva/sclera: Conjunctivae normal.     Pupils: Pupils are equal, round, and reactive to light.  Neck:     Thyroid: No thyromegaly.     Vascular: No carotid bruit or JVD.  Cardiovascular:     Rate and Rhythm: Normal rate and regular rhythm.     Pulses: Intact distal pulses.     Heart sounds: Normal heart sounds. No murmur heard. No friction rub. No gallop.   Pulmonary:     Effort: Pulmonary effort is normal.     Breath sounds: Normal breath sounds. No wheezing, rhonchi or rales.  Chest:     Chest wall: No tenderness.  Abdominal:     General: Bowel sounds are normal.     Palpations: Abdomen is soft. There is no mass.     Tenderness: There is no abdominal tenderness. There is no right CVA tenderness, left CVA tenderness, guarding or rebound.  Musculoskeletal:        General: No  edema. Normal range of motion.     Cervical back: Normal range of motion and neck supple. No rigidity or tenderness.  Lymphadenopathy:     Cervical: No cervical adenopathy.  Skin:    General: Skin is warm and dry.     Coloration: Skin is not jaundiced or pale.     Findings: No bruising, erythema, lesion or rash.  Neurological:     Mental Status: She is alert.     Deep Tendon Reflexes: Reflexes are normal and symmetric.     Wt Readings from Last 3 Encounters:  07/19/20 204 lb (92.5 kg)  04/05/20 203 lb 2.5 oz (92.1 kg)  02/23/20 207 lb (93.9 kg)    BP 132/70   Pulse 72   Ht 5\' 4"  (1.626 m)   Wt 204 lb (92.5 kg)   BMI 35.02 kg/m   Assessment and Plan: ......................1. Essential hypertension Chronic.  Controlled.  Stable.  Blood pressure is 132/70.  Patient will continue losartan 50 mg once a day.  Will obtain CMP for electrolytes and GFR. - losartan (COZAAR) 50 MG tablet; Take 1 tablet (50 mg total) by mouth daily.  Dispense: 90 tablet; Refill: 0 - Comprehensive Metabolic Panel (CMET)  2. Dyslipidemia Chronic.  Controlled.  Stable.  Continue simvastatin 20 mg once a day.  Will check lipid panel for current LDL status. - simvastatin (ZOCOR) 20 MG tablet; TAKE 1 TABLET(20 MG) BY MOUTH DAILY  Dispense: 90 tablet; Refill: 0 - Lipid Panel With LDL/HDL Ratio  3. Localized osteoporosis without current pathological fracture Tolerating well patient is currently on Fosamax 70 mg once a week it is not having any issues with reflux.  We will continue current dosing. - alendronate (FOSAMAX) 70 MG tablet; TAKE 1 TABLET(70 MG) BY MOUTH 1 TIME A WEEK WITH A FULL GLASS OF WATER AND ON AN EMPTY STOMACH  Dispense: 12 tablet; Refill: 1  4. Gastroesophageal reflux disease, unspecified whether esophagitis present Chronic.  Controlled.  Stable.  Continue famotidine 20 mg 1 twice a day. - famotidine (PEPCID) 20 MG tablet; Take 1 tablet (20 mg total) by mouth 2 (two) times daily.  Dispense:  180 tablet; Refill: 1

## 2020-07-20 LAB — COMPREHENSIVE METABOLIC PANEL
ALT: 10 IU/L (ref 0–32)
AST: 13 IU/L (ref 0–40)
Albumin/Globulin Ratio: 1.5 (ref 1.2–2.2)
Albumin: 4.2 g/dL (ref 3.8–4.8)
Alkaline Phosphatase: 92 IU/L (ref 44–121)
BUN/Creatinine Ratio: 13 (ref 12–28)
BUN: 9 mg/dL (ref 8–27)
Bilirubin Total: 0.4 mg/dL (ref 0.0–1.2)
CO2: 29 mmol/L (ref 20–29)
Calcium: 9.5 mg/dL (ref 8.7–10.3)
Chloride: 101 mmol/L (ref 96–106)
Creatinine, Ser: 0.7 mg/dL (ref 0.57–1.00)
GFR calc Af Amer: 102 mL/min/{1.73_m2} (ref >59)
GFR calc non Af Amer: 89 mL/min/{1.73_m2} (ref >59)
Globulin, Total: 2.8 g/dL (ref 1.5–4.5)
Glucose: 89 mg/dL (ref 65–99)
Potassium: 4.1 mmol/L (ref 3.5–5.2)
Sodium: 140 mmol/L (ref 134–144)
Total Protein: 7 g/dL (ref 6.0–8.5)

## 2020-07-20 LAB — LIPID PANEL WITH LDL/HDL RATIO
Cholesterol, Total: 188 mg/dL (ref 100–199)
HDL: 58 mg/dL (ref >39)
LDL Chol Calc (NIH): 103 mg/dL — ABNORMAL HIGH (ref 0–99)
LDL/HDL Ratio: 1.8 ratio (ref 0.0–3.2)
Triglycerides: 154 mg/dL — ABNORMAL HIGH (ref 0–149)
VLDL Cholesterol Cal: 27 mg/dL (ref 5–40)

## 2020-08-08 ENCOUNTER — Inpatient Hospital Stay: Payer: PPO

## 2020-08-08 ENCOUNTER — Other Ambulatory Visit: Payer: Self-pay

## 2020-08-08 ENCOUNTER — Inpatient Hospital Stay: Payer: PPO | Attending: Hematology and Oncology

## 2020-08-08 DIAGNOSIS — D509 Iron deficiency anemia, unspecified: Secondary | ICD-10-CM | POA: Insufficient documentation

## 2020-08-08 DIAGNOSIS — E538 Deficiency of other specified B group vitamins: Secondary | ICD-10-CM

## 2020-08-08 DIAGNOSIS — D649 Anemia, unspecified: Secondary | ICD-10-CM

## 2020-08-08 LAB — CBC WITH DIFFERENTIAL/PLATELET
Abs Immature Granulocytes: 0.03 10*3/uL (ref 0.00–0.07)
Basophils Absolute: 0.1 10*3/uL (ref 0.0–0.1)
Basophils Relative: 1 %
Eosinophils Absolute: 0.2 10*3/uL (ref 0.0–0.5)
Eosinophils Relative: 2 %
HCT: 36.4 % (ref 36.0–46.0)
Hemoglobin: 11.8 g/dL — ABNORMAL LOW (ref 12.0–15.0)
Immature Granulocytes: 0 %
Lymphocytes Relative: 14 %
Lymphs Abs: 1.1 10*3/uL (ref 0.7–4.0)
MCH: 29.6 pg (ref 26.0–34.0)
MCHC: 32.4 g/dL (ref 30.0–36.0)
MCV: 91.5 fL (ref 80.0–100.0)
Monocytes Absolute: 0.4 10*3/uL (ref 0.1–1.0)
Monocytes Relative: 5 %
Neutro Abs: 6.4 10*3/uL (ref 1.7–7.7)
Neutrophils Relative %: 78 %
Platelets: 259 10*3/uL (ref 150–400)
RBC: 3.98 MIL/uL (ref 3.87–5.11)
RDW: 12.4 % (ref 11.5–15.5)
WBC: 8.2 10*3/uL (ref 4.0–10.5)
nRBC: 0 % (ref 0.0–0.2)

## 2020-08-08 LAB — FERRITIN: Ferritin: 15 ng/mL (ref 11–307)

## 2020-08-08 NOTE — Progress Notes (Signed)
[facility]  [address], [address] [place], [place] [address] Phone: [phone]  Fax: [phone]   Clinic Day:  08/09/2020  Referring physician: Juline Patch, MD  Chief Complaint: Ellen Baker is a 70 y.o. female with iron deficiency anemia and B12 deficiency who is seen for 4 month assessment.  HPI: The patient was last seen in the hematology clinic on 04/05/2020. At that time, she felt "alright". She had lost 19 lbs since her surgery; she was eating less. She ate liver, turnip greens, and pinto beans. She ate red meat occasionally and did not eat much fish. She was taking Vitamin B12.  Exam was stable. Hematocrit was 39.4, hemoglobin 13.0, platelets 254,000, WBC 4,300. Ferritin was 41. She was to decrease her oral iron from BID to once daily.  Bilateral screening mammogram on 05/17/2020 revealed no evidence of malignancy.  Bone density on 06/20/2020 revealed osteopenia with a T score of -2.3 at the left femur neck.  CBC has been followed: 06/07/2020:  Hematocrit 35.4, hemoglobin 11.7, MCV 91.7, platelets 257,000, WBC 4,900. Ferritin 30. Vitamin B12 was 1,692 and folate 36.0.  08/08/2020:  Hematocrit 36.4, hemoglobin 11.8, MCV 91.5, platelets 259,000, WBC 8200.  Ferritin 15.  B12 was 1,692 and folate 36.0 on 06/07/2020.  B12 was decreased to 1000 mcg 3x/week.  B12 was 1,248 on 07/07/2020.  During the interim, she has been "good." She ate liver twice this month. She eats turnip greens and pinto beans. She ran out of oral iron about a week ago because she keeps forgetting to pick them up. She likes to take Vitron-C because it does not make her constipated. Her melena has resolved.  Her right knee pain has improved.  She started taking Fosamax weekly two weeks ago. She takes vitamin B12 3x per week (MWF). She will start taking it twice per week.   Past Medical History:  Diagnosis Date  . Arthritis    right knee  . Carotid artery occlusion   .  Dizziness   . GERD (gastroesophageal reflux disease)   . Heart murmur   . History of colon polyps   . Hyperlipidemia   . Hypertension   . Iron deficiency   . Stroke [facility]) 12-31-12   no residual effects    Past Surgical History:  Procedure Laterality Date  . CAROTID ENDARTERECTOMY     RIGHT  01/19/13  . COLONOSCOPY  08-18-12  . COLONOSCOPY WITH PROPOFOL N/A 03/10/2018   Procedure: COLONOSCOPY WITH PROPOFOL;  Surgeon: Lollie Sails, MD;  Location: [facility] ENDOSCOPY;  Service: Endoscopy;  Laterality: N/A;  . DILATION AND CURETTAGE OF UTERUS    . ENDARTERECTOMY Right 01/19/2013   Procedure: ENDARTERECTOMY CAROTID with patch angioplasty;  Surgeon: Conrad Chatmoss, MD;  Location: [place];  Service: Vascular;  Laterality: Right;  . ESOPHAGEAL DILATION    . JOINT REPLACEMENT Left 04/15/2017  . taotal knee arthorplasty Right 2021  . TOTAL KNEE ARTHROPLASTY Right 12/01/2019   Procedure: TOTAL KNEE ARTHROPLASTY - RNFA;  Surgeon: Corky Mull, MD;  Location: [facility] ORS;  Service: Orthopedics;  Laterality: Right;  . TUBAL LIGATION    . UPPER GI ENDOSCOPY  08-18-12    Family History  Problem Relation Age of Onset  . Stroke Mother   . Deep vein thrombosis Mother   . Heart disease Mother        Amputation-  . Hypertension Mother   . Alzheimer's disease Mother   . Heart failure Father   .  Heart disease Father        CHF  . Hypertension Father   . Varicose Veins Father   . Parkinson's disease Father   . Breast cancer Paternal Aunt 37    Social History:  reports that she has never smoked. She has never used smokeless tobacco. She reports that she does not drink alcohol and does not use drugs.the patient recently retired back in 05/2019. 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 She was recently furloughed for a short period from her job due to the pandemic. She lives in [place]. Her daughter's name is Amy. She has a dog named Water quality scientist (havanese breed). She is alone today.   Allergies:  Allergies   Allergen Reactions  . Adhesive [Tape] Rash    Skin became RAW    Current Medications: Current Outpatient Medications  Medication Sig Dispense Refill  . alendronate (FOSAMAX) 70 MG tablet TAKE 1 TABLET(70 MG) BY MOUTH 1 TIME A WEEK WITH A FULL GLASS OF WATER AND ON AN EMPTY STOMACH 12 tablet 1  . Calcium Carbonate-Vitamin D3 600-400 MG-UNIT TABS Take 1 tablet by mouth daily.    . clopidogrel (PLAVIX) 75 MG tablet TAKE 1 TABLET(75 MG) BY MOUTH DAILY 90 tablet 0  . famotidine (PEPCID) 20 MG tablet Take 1 tablet (20 mg total) by mouth 2 (two) times daily. 180 tablet 1  . ibuprofen (ADVIL) 200 MG tablet Take by mouth.    . Iron-Vitamin C 65-125 MG TABS Take 1 tablet by mouth 2 (two) times daily. 60 tablet 1  . loratadine (CLARITIN) 10 MG tablet Take 1 tablet (10 mg total) by mouth continuous as needed for allergies. 30 tablet 6  . losartan (COZAAR) 50 MG tablet Take 1 tablet (50 mg total) by mouth daily. 90 tablet 0  . Multiple Vitamins-Minerals (CENTRUM SILVER ULTRA WOMENS) TABS Take 1 tablet by mouth daily.    . simvastatin (ZOCOR) 20 MG tablet TAKE 1 TABLET(20 MG) BY MOUTH DAILY 90 tablet 0  . vitamin B-12 (CYANOCOBALAMIN) 1000 MCG tablet Take 1,000 mcg by mouth 3 (three) times a week.     No current facility-administered medications for this visit.    Review of Systems  Constitutional: Negative.  Negative for chills, diaphoresis, fever, malaise/fatigue and weight loss (up 4 lbs).       Feels "good".  HENT: Negative.  Negative for congestion, ear discharge, ear pain, hearing loss, nosebleeds, sinus pain, sore throat and tinnitus.   Eyes: Negative.  Negative for blurred vision.  Respiratory: Negative.  Negative for cough, hemoptysis, sputum production and shortness of breath.   Cardiovascular: Negative.  Negative for chest pain, palpitations and leg swelling.  Gastrointestinal: Negative.  Negative for abdominal pain, blood in stool, constipation, diarrhea, heartburn, melena, nausea and  vomiting.  Genitourinary: Negative.  Negative for dysuria, frequency, hematuria and urgency.  Musculoskeletal: Positive for joint pain (RIGHT knee, improved). Negative for back pain, falls, myalgias and neck pain.  Skin: Negative.  Negative for itching and rash.  Neurological: Negative for dizziness, tingling, speech change, weakness and headaches.       History of a CVA in 2014.  Endo/Heme/Allergies: Negative.  Does not bruise/bleed easily.  Psychiatric/Behavioral: Negative.  Negative for depression and memory loss. The patient is not nervous/anxious and does not have insomnia.   All other systems reviewed and are negative.  Performance status (ECOG): 1  Vitals Blood pressure (!) 167/74, pulse 68, temperature (!) 96.1 F (35.6 C), temperature source Tympanic, resp. rate 18, weight 207  lb 3.7 oz (94 kg), SpO2 100 %.   Physical Exam Vitals and nursing note reviewed.  Constitutional:      General: She is not in acute distress.    Appearance: She is well-developed. She is not diaphoretic.  HENT:     Head: Normocephalic and atraumatic.     Comments: Gray hair.    Mouth/Throat:     Pharynx: No oropharyngeal exudate.  Eyes:     General: No scleral icterus.    Conjunctiva/sclera: Conjunctivae normal.     Pupils: Pupils are equal, round, and reactive to light.     Comments: Glasses.  Blue eyes.  Neck:     Vascular: No JVD.  Cardiovascular:     Rate and Rhythm: Normal rate and regular rhythm.     Heart sounds: Normal heart sounds. No murmur heard.   Pulmonary:     Effort: Pulmonary effort is normal. No respiratory distress.     Breath sounds: Normal breath sounds. No wheezing.  Chest:  Breasts:     Right: No axillary adenopathy or supraclavicular adenopathy.     Left: No axillary adenopathy or supraclavicular adenopathy.    Abdominal:     General: Bowel sounds are normal. There is no distension.     Palpations: Abdomen is soft. There is no hepatomegaly or splenomegaly.      Tenderness: There is no abdominal tenderness.  Musculoskeletal:        General: No swelling or tenderness. Normal range of motion.     Cervical back: Normal range of motion and neck supple.     Right lower leg: No edema.     Left lower leg: No edema.  Lymphadenopathy:     Head:     Right side of head: No preauricular, posterior auricular or occipital adenopathy.     Left side of head: No preauricular, posterior auricular or occipital adenopathy.     Cervical: No cervical adenopathy.     Upper Body:     Right upper body: No supraclavicular or axillary adenopathy.     Left upper body: No supraclavicular or axillary adenopathy.     Lower Body: No right inguinal adenopathy. No left inguinal adenopathy.  Skin:    General: Skin is warm and dry.     Findings: No erythema.  Neurological:     Mental Status: She is alert and oriented to person, place, and time.  Psychiatric:        Behavior: Behavior normal.        Thought Content: Thought content normal.        Judgment: Judgment normal.    Appointment on 08/08/2020  Component Date Value Ref Range Status  . Ferritin 08/08/2020 15  11 - 307 ng/mL Final   Performed at [facility], [address] [place], [place] [address]  . WBC 08/08/2020 8.2  4.0 - 10.5 K/uL Final  . RBC 08/08/2020 3.98  3.87 - 5.11 MIL/uL Final  . Hemoglobin 08/08/2020 11.8* 12.0 - 15.0 g/dL Final  . HCT 08/08/2020 36.4  36.0 - 46.0 % Final  . MCV 08/08/2020 91.5  80.0 - 100.0 fL Final  . MCH 08/08/2020 29.6  26.0 - 34.0 pg Final  . MCHC 08/08/2020 32.4  30.0 - 36.0 g/dL Final  . RDW 08/08/2020 12.4  11.5 - 15.5 % Final  . Platelets 08/08/2020 259  150 - 400 K/uL Final  . nRBC 08/08/2020 0.0  0.0 - 0.2 % Final  . Neutrophils Relative % 08/08/2020 78  %  Final  . Neutro Abs 08/08/2020 6.4  1.7 - 7.7 K/uL Final  . Lymphocytes Relative 08/08/2020 14  % Final  . Lymphs Abs 08/08/2020 1.1  0.7 - 4.0 K/uL Final  . Monocytes Relative 08/08/2020 5  % Final  .  Monocytes Absolute 08/08/2020 0.4  0.1 - 1.0 K/uL Final  . Eosinophils Relative 08/08/2020 2  % Final  . Eosinophils Absolute 08/08/2020 0.2  0.0 - 0.5 K/uL Final  . Basophils Relative 08/08/2020 1  % Final  . Basophils Absolute 08/08/2020 0.1  0.0 - 0.1 K/uL Final  . Immature Granulocytes 08/08/2020 0  % Final  . Abs Immature Granulocytes 08/08/2020 0.03  0.00 - 0.07 K/uL Final   Performed at [facility], [address] [place], [place] [address]    Assessment:  Langdon Place is a 70 y.o. female with iron deficiency. Diet appears good. She denies any melena, hematochezia, hematuria or vaginal bleeding. She is on Plavix.  Work-up on 07/21/2018 revealed a hematocrit of 35.4, hemoglobin 10.9, MCV 81.6, platelets 331,000, WBC 6300 with an ANC of 3900.  Ferritin was 8 with an iron saturation of 4% and a TIBC of 529.  B12 was 255 (low normal).  Retic was 1.1%.  Normal studies included folate, TSH, and urinalysis (no hematuria).  She is on Vitron-C.  Ferritin has been followed: 14 on 06/06/2018, 17 on 06/27/2018, 8 on 07/21/2018, 16 on 08/25/2018, 23 on 09/22/2018, 42 on 11/26/2018, 59 on 01/20/2019, 67 on 03/18/2019, 82 on 06/02/2019, 69 on 08/04/2019, 82 on 10/05/2019, 74 on 01/04/2020, and 41 on 04/04/2020.  EGD on 08/18/2012 revealed reflux esophagitis.  Esophageal biopsy at 32 cm revealed columnar mucosa with acute and chronic inflammation.  Esophageal biopsy at 30 cm revealed squamous mucosa with reflux changes and mild chronic inflammation.  There was no dysplasia or malignancy.  Colonoscopy on 03/10/2018 revealed diverticulosis in the sigmoid colon, in the descending colon, in the transverse colon and in the ascending colon.  There was one 3 mm polyp at the hepatic flexure (tubular adenoma) and two 4 to 6 mm polyps (hyperplastic) in the sigmoid colon.  She has B12 deficiency.  She started oral B12 on 07/22/2018.  She is on B12 3 days/week.  B12 was 255 on  02/03/20200 and 1248 on 07/07/2020.  Folate was 36 on 06/07/2020.  She received the [place] COVID-19 vaccine on 08/11/2019 and 09/01/2019. She received the [employer] on 04/27/2020. She received the high dose flu vaccine on 04/11/2020.   Symptomatically, she feels "good." She is eating iron rich foods. She ran out of oral iron about a week ago; she likes to take Vitron-C because it does not make her constipated. Her melena has resolved.   Plan: 1.   Review labs from 08/08/2020. 2.   Iron deficiency anemia Hematocrit 36.4.  Hemoglobin 11.8.  MCV 91.5. Ferritin 15. Ferritin goal 100.  Patient notes that she ran out of iron 1 week ago. Continue oral iron. Consider IV iron if unable to replete iron stores and patient symptomatic. 3.   B12 deficiency B12 was 476 on 08/25/2018. Continues oral B12. Check B12 and folate annually 4.   Hypertension  BP 169/91.  RN:  recheck BP. 5.   RTC in 3 months for labs (CBC and ferritin). 6.   RTC in 6 months for MD assess and labs (CBC with diff, ferritin, B12- day before).  I discussed the assessment and treatment plan with the patient.  The patient was provided  an opportunity to ask questions and all were answered.  The patient agreed with the plan and demonstrated an understanding of the instructions.  The patient was advised to call back if the symptoms worsen or if the condition fails to improve as anticipated.   Lequita Asal, MD, PhD    08/09/2020, 11:09 AM  I, Mirian Mo Tufford, am acting as Education administrator for [employer]. Mike Gip, MD, PhD.  I, Madeline Bebout C. Mike Gip, MD, have reviewed the above documentation for accuracy and completeness, and I agree with the above.

## 2020-08-09 ENCOUNTER — Encounter: Payer: Self-pay | Admitting: Hematology and Oncology

## 2020-08-09 ENCOUNTER — Other Ambulatory Visit: Payer: PPO

## 2020-08-09 ENCOUNTER — Telehealth: Payer: Self-pay

## 2020-08-09 ENCOUNTER — Inpatient Hospital Stay (HOSPITAL_BASED_OUTPATIENT_CLINIC_OR_DEPARTMENT_OTHER): Payer: PPO | Admitting: Hematology and Oncology

## 2020-08-09 VITALS — BP 169/91 | HR 68 | Temp 96.1°F | Resp 18 | Wt 207.2 lb

## 2020-08-09 DIAGNOSIS — D509 Iron deficiency anemia, unspecified: Secondary | ICD-10-CM | POA: Diagnosis not present

## 2020-08-09 DIAGNOSIS — E538 Deficiency of other specified B group vitamins: Secondary | ICD-10-CM

## 2020-08-09 NOTE — Progress Notes (Signed)
Patient states she feels like she has more energy now. Patient will go to [facility] today to get more iron tablets.    rechecked patients BP: 169/91- advised patient to go home and relax and check it there. If still high, call her blood pressure medication prescriber.

## 2020-08-09 NOTE — Telephone Encounter (Signed)
  Do you mean 65 mg of elemental iron?  Please have her restart with OJ or vitamin C.  M

## 2020-08-14 ENCOUNTER — Telehealth: Payer: Self-pay | Admitting: Family Medicine

## 2020-08-14 DIAGNOSIS — M816 Localized osteoporosis [Lequesne]: Secondary | ICD-10-CM

## 2020-08-23 ENCOUNTER — Other Ambulatory Visit: Payer: Self-pay

## 2020-08-23 DIAGNOSIS — M816 Localized osteoporosis [Lequesne]: Secondary | ICD-10-CM

## 2020-08-23 MED ORDER — ALENDRONATE SODIUM 70 MG PO TABS: ORAL_TABLET | ORAL | 1 refills | Status: DC

## 2020-08-23 NOTE — Telephone Encounter (Signed)
Patient was under the impression a 3 month supply of alendronate (FOSAMAX) 70 MG tablet would be sent in, patient is now out requesting a 3 month supply. Patient contacted the pharmacy and was advise to call PCP    [facility], [place] [address] AT [place] Phone:  [phone]  Fax:  [phone]

## 2020-08-23 NOTE — Progress Notes (Unsigned)
Sent fosamax in

## 2020-08-23 NOTE — Telephone Encounter (Signed)
Sent in fosamax #[phone] with 1 refill to [facility] [place]

## 2020-09-26 ENCOUNTER — Other Ambulatory Visit: Payer: Self-pay | Admitting: Family Medicine

## 2020-09-26 DIAGNOSIS — I10 Essential (primary) hypertension: Secondary | ICD-10-CM

## 2020-09-26 DIAGNOSIS — E785 Hyperlipidemia, unspecified: Secondary | ICD-10-CM

## 2020-11-03 ENCOUNTER — Other Ambulatory Visit: Payer: Self-pay

## 2020-11-03 DIAGNOSIS — E538 Deficiency of other specified B group vitamins: Secondary | ICD-10-CM

## 2020-11-03 DIAGNOSIS — D509 Iron deficiency anemia, unspecified: Secondary | ICD-10-CM

## 2020-11-07 ENCOUNTER — Other Ambulatory Visit: Payer: PPO

## 2020-11-08 ENCOUNTER — Other Ambulatory Visit: Payer: Self-pay

## 2020-11-08 ENCOUNTER — Inpatient Hospital Stay: Payer: PPO | Attending: Oncology | Admitting: Oncology

## 2020-11-08 DIAGNOSIS — D509 Iron deficiency anemia, unspecified: Secondary | ICD-10-CM | POA: Diagnosis not present

## 2020-11-08 DIAGNOSIS — E538 Deficiency of other specified B group vitamins: Secondary | ICD-10-CM

## 2020-11-08 LAB — CBC WITH DIFFERENTIAL/PLATELET
Abs Immature Granulocytes: 0.02 10*3/uL (ref 0.00–0.07)
Basophils Absolute: 0 10*3/uL (ref 0.0–0.1)
Basophils Relative: 1 %
Eosinophils Absolute: 0.3 10*3/uL (ref 0.0–0.5)
Eosinophils Relative: 5 %
HCT: 36.7 % (ref 36.0–46.0)
Hemoglobin: 12.1 g/dL (ref 12.0–15.0)
Immature Granulocytes: 0 %
Lymphocytes Relative: 19 %
Lymphs Abs: 1 10*3/uL (ref 0.7–4.0)
MCH: 30.5 pg (ref 26.0–34.0)
MCHC: 33 g/dL (ref 30.0–36.0)
MCV: 92.4 fL (ref 80.0–100.0)
Monocytes Absolute: 0.4 10*3/uL (ref 0.1–1.0)
Monocytes Relative: 8 %
Neutro Abs: 3.4 10*3/uL (ref 1.7–7.7)
Neutrophils Relative %: 67 %
Platelets: 221 10*3/uL (ref 150–400)
RBC: 3.97 MIL/uL (ref 3.87–5.11)
RDW: 12.7 % (ref 11.5–15.5)
WBC: 5.1 10*3/uL (ref 4.0–10.5)
nRBC: 0 % (ref 0.0–0.2)

## 2020-11-08 LAB — VITAMIN B12: Vitamin B-12: 747 pg/mL (ref 180–914)

## 2020-11-08 LAB — FERRITIN: Ferritin: 18 ng/mL (ref 11–307)

## 2020-11-09 ENCOUNTER — Telehealth: Payer: Self-pay

## 2020-11-09 NOTE — Progress Notes (Signed)
Her labs look good. Can you let her know.   Faythe Casa, NP 11/09/2020 10:38 AM

## 2020-11-09 NOTE — Telephone Encounter (Signed)
Pt is aware of good lab results per Sonia Baller. Pt is satisfied and aware of future appt.

## 2020-11-11 ENCOUNTER — Encounter: Payer: Self-pay | Admitting: Urgent Care

## 2021-01-01 ENCOUNTER — Other Ambulatory Visit: Payer: Self-pay | Admitting: Family Medicine

## 2021-01-01 DIAGNOSIS — E785 Hyperlipidemia, unspecified: Secondary | ICD-10-CM

## 2021-01-01 DIAGNOSIS — I10 Essential (primary) hypertension: Secondary | ICD-10-CM

## 2021-01-01 NOTE — Telephone Encounter (Signed)
Requested Prescriptions  Pending Prescriptions Disp Refills  . losartan (COZAAR) 50 MG tablet [Pharmacy Med Name: LOSARTAN 50MG  TABLETS] 90 tablet 0    Sig: TAKE 1 TABLET BY MOUTH DAILY     Cardiovascular:  Angiotensin Receptor Blockers Failed - 01/01/2021  7:40 AM      Failed - Last BP in normal range    BP Readings from Last 1 Encounters:  08/09/20 (!) 169/91         Passed - Cr in normal range and within 180 days    Creatinine  Date Value Ref Range Status  12/31/2012 0.79 0.60 - 1.30 mg/dL Final   Creatinine, Ser  Date Value Ref Range Status  07/19/2020 0.70 0.57 - 1.00 mg/dL Final         Passed - K in normal range and within 180 days    Potassium  Date Value Ref Range Status  07/19/2020 4.1 3.5 - 5.2 mmol/L Final  12/31/2012 3.4 (L) 3.5 - 5.1 mmol/L Final         Passed - Patient is not pregnant      Passed - Valid encounter within last 6 months    Recent Outpatient Visits          5 months ago Essential hypertension   [place], Deanna C, MD   1 year ago Essential hypertension   [place], Deanna C, MD   1 year ago Essential hypertension   [place], Deanna C, MD   2 years ago Essential hypertension   [place], Deanna C, MD   2 years ago Essential hypertension   [place], Deanna C, MD      Future Appointments            In 2 weeks Juline Patch, MD [facility], [facility]           . simvastatin (ZOCOR) 20 MG tablet [Pharmacy Med Name: SIMVASTATIN 20MG  TABLETS] 90 tablet 0    Sig: TAKE 1 TABLET(20 MG) BY MOUTH DAILY     Cardiovascular:  Antilipid - Statins Failed - 01/01/2021  7:40 AM      Failed - LDL in normal range and within 360 days    LDL Chol Calc (NIH)  Date Value Ref Range Status  07/19/2020 103 (H) 0 - 99 mg/dL Final         Failed - Triglycerides in normal range and within 360 days    Triglycerides  Date Value Ref Range Status  07/19/2020  154 (H) 0 - 149 mg/dL Final         Passed - Total Cholesterol in normal range and within 360 days    Cholesterol, Total  Date Value Ref Range Status  07/19/2020 188 100 - 199 mg/dL Final         Passed - HDL in normal range and within 360 days    HDL  Date Value Ref Range Status  07/19/2020 58 >39 mg/dL Final         Passed - Patient is not pregnant      Passed - Valid encounter within last 12 months    Recent Outpatient Visits          5 months ago Essential hypertension   [facility] Juline Patch, MD   1 year ago Essential hypertension   [facility] Juline Patch, MD   1 year ago Essential hypertension  [facility] Juline Patch, MD   2 years ago Essential hypertension   [place], Deanna C, MD   2 years ago Essential hypertension   [place], Deanna C, MD      Future Appointments            In 2 weeks Juline Patch, MD [facility], [place]           . clopidogrel (PLAVIX) 75 MG tablet [Pharmacy Med Name: CLOPIDOGREL 75MG  TABLETS] 90 tablet 0    Sig: TAKE 1 TABLET(75 MG) BY MOUTH DAILY     Hematology: Antiplatelets - clopidogrel Failed - 01/01/2021  7:40 AM      Failed - Evaluate AST, ALT within 2 months of therapy initiation.      Passed - ALT in normal range and within 360 days    ALT  Date Value Ref Range Status  07/19/2020 10 0 - 32 IU/L Final   SGPT (ALT)  Date Value Ref Range Status  12/31/2012 19 12 - 78 U/L Final         Passed - AST in normal range and within 360 days    AST  Date Value Ref Range Status  07/19/2020 13 0 - 40 IU/L Final   SGOT(AST)  Date Value Ref Range Status  12/31/2012 26 15 - 37 Unit/L Final         Passed - HCT in normal range and within 180 days    HCT  Date Value Ref Range Status  11/08/2020 36.7 36.0 - 46.0 % Final   Hematocrit  Date Value Ref Range Status  06/06/2018 30.3 (L) 34.0 - 46.6 % Final         Passed - HGB in  normal range and within 180 days    Hemoglobin  Date Value Ref Range Status  11/08/2020 12.1 12.0 - 15.0 g/dL Final  06/27/2018 9.6 (L) 11.1 - 15.9 g/dL Final         Passed - PLT in normal range and within 180 days    Platelets  Date Value Ref Range Status  11/08/2020 221 150 - 400 K/uL Final  06/06/2018 290 150 - 450 x10E3/uL Final         Passed - Valid encounter within last 6 months    Recent Outpatient Visits          5 months ago Essential hypertension   [place], Deanna C, MD   1 year ago Essential hypertension   [place], Deanna C, MD   1 year ago Essential hypertension   [place], Deanna C, MD   2 years ago Essential hypertension   [place], Deanna C, MD   2 years ago Essential hypertension   [place], Deanna C, MD      Future Appointments            In 2 weeks Juline Patch, MD [facility], [facility]

## 2021-01-16 ENCOUNTER — Other Ambulatory Visit: Payer: Self-pay | Admitting: Family Medicine

## 2021-01-16 DIAGNOSIS — K219 Gastro-esophageal reflux disease without esophagitis: Secondary | ICD-10-CM

## 2021-01-17 ENCOUNTER — Other Ambulatory Visit: Payer: Self-pay

## 2021-01-17 ENCOUNTER — Encounter: Payer: Self-pay | Admitting: Family Medicine

## 2021-01-17 ENCOUNTER — Ambulatory Visit (INDEPENDENT_AMBULATORY_CARE_PROVIDER_SITE_OTHER): Payer: PPO | Admitting: Family Medicine

## 2021-01-17 VITALS — BP 128/84 | HR 96 | Ht 64.0 in | Wt 213.0 lb

## 2021-01-17 DIAGNOSIS — K219 Gastro-esophageal reflux disease without esophagitis: Secondary | ICD-10-CM

## 2021-01-17 DIAGNOSIS — H8110 Benign paroxysmal vertigo, unspecified ear: Secondary | ICD-10-CM | POA: Diagnosis not present

## 2021-01-17 DIAGNOSIS — I10 Essential (primary) hypertension: Secondary | ICD-10-CM

## 2021-01-17 DIAGNOSIS — M816 Localized osteoporosis [Lequesne]: Secondary | ICD-10-CM

## 2021-01-17 DIAGNOSIS — E785 Hyperlipidemia, unspecified: Secondary | ICD-10-CM | POA: Diagnosis not present

## 2021-01-17 MED ORDER — LOSARTAN POTASSIUM 50 MG PO TABS: 50.0000 mg | ORAL_TABLET | Freq: Every day | ORAL | 1 refills | Status: DC

## 2021-01-17 MED ORDER — CLOPIDOGREL BISULFATE 75 MG PO TABS: ORAL_TABLET | ORAL | 1 refills | Status: DC

## 2021-01-17 MED ORDER — SIMVASTATIN 20 MG PO TABS: ORAL_TABLET | ORAL | 1 refills | Status: DC

## 2021-01-17 MED ORDER — MECLIZINE HCL 25 MG PO TABS: 25.0000 mg | ORAL_TABLET | Freq: Three times a day (TID) | ORAL | 0 refills | Status: DC | PRN

## 2021-01-17 MED ORDER — ALENDRONATE SODIUM 70 MG PO TABS: ORAL_TABLET | ORAL | 1 refills | Status: DC

## 2021-01-17 MED ORDER — FAMOTIDINE 20 MG PO TABS: ORAL_TABLET | ORAL | 1 refills | Status: DC

## 2021-01-17 NOTE — Progress Notes (Signed)
Date:  01/17/2021   Name:  Ellen Baker   DOB:  [birth year]   MRN:  [id number]   Chief Complaint: Osteoporosis, Coronary Artery Disease, Hypertension, Hyperlipidemia, Gastroesophageal Reflux, and Dizziness  Coronary Artery Disease Presents for follow-up visit. Symptoms include dizziness and shortness of breath. Pertinent negatives include no chest pain, chest pressure, chest tightness, leg swelling or palpitations. Risk factors include hyperlipidemia and hypertension. Compliance with diet is variable. Compliance with exercise is poor. Compliance with medications is good.  Hypertension This is a chronic problem. The current episode started more than 1 year ago. The problem is unchanged. Associated symptoms include shortness of breath. Pertinent negatives include no anxiety, blurred vision, chest pain, headaches, malaise/fatigue, neck pain, orthopnea, palpitations or peripheral edema.  Hyperlipidemia This is a chronic problem. The current episode started more than 1 year ago. Associated symptoms include shortness of breath. Pertinent negatives include no chest pain, leg pain or myalgias.  Gastroesophageal Reflux She reports no abdominal pain, no belching, no chest pain, no choking, no coughing, no dysphagia, no heartburn, no hoarse voice, no nausea, no sore throat or no wheezing.  Dizziness This is a new problem. The current episode started more than 1 month ago. The problem occurs intermittently. The problem has been unchanged. Associated symptoms include vertigo. Pertinent negatives include no abdominal pain, chest pain, chills, coughing, fever, headaches, myalgias, nausea, neck pain, rash or sore throat. Treatments tried: meclizine.   Lab Results  Component Value Date   CREATININE 0.70 07/19/2020   BUN 9 07/19/2020   NA 140 07/19/2020   K 4.1 07/19/2020   CL 101 07/19/2020   CO2 29 07/19/2020   Lab Results  Component Value Date   CHOL 188 07/19/2020   HDL 58 07/19/2020    LDLCALC 103 (H) 07/19/2020   TRIG 154 (H) 07/19/2020   CHOLHDL 2.6 12/05/2017   Lab Results  Component Value Date   TSH 1.757 07/21/2018   Lab Results  Component Value Date   HGBA1C 5.3 01/01/2013   Lab Results  Component Value Date   WBC 5.1 11/08/2020   HGB 12.1 11/08/2020   HCT 36.7 11/08/2020   MCV 92.4 11/08/2020   PLT 221 11/08/2020   Lab Results  Component Value Date   ALT 10 07/19/2020   AST 13 07/19/2020   ALKPHOS 92 07/19/2020   BILITOT 0.4 07/19/2020     Review of Systems  Constitutional:  Negative for chills, fever and malaise/fatigue.  HENT:  Negative for drooling, ear discharge, ear pain, hoarse voice and sore throat.   Eyes:  Negative for blurred vision.  Respiratory:  Positive for shortness of breath. Negative for cough, choking, chest tightness and wheezing.   Cardiovascular:  Negative for chest pain, palpitations, orthopnea and leg swelling.  Gastrointestinal:  Negative for abdominal pain, blood in stool, constipation, diarrhea, dysphagia, heartburn and nausea.  Endocrine: Negative for polydipsia.  Genitourinary:  Negative for dysuria, frequency, hematuria and urgency.  Musculoskeletal:  Negative for back pain, myalgias and neck pain.  Skin:  Negative for rash.  Allergic/Immunologic: Negative for environmental allergies.  Neurological:  Positive for dizziness and vertigo. Negative for headaches.  Hematological:  Does not bruise/bleed easily.  Psychiatric/Behavioral:  Negative for suicidal ideas. The patient is not nervous/anxious.    Patient Active Problem List   Diagnosis Date Noted   Status post total knee replacement using cement, right 12/01/2019   B12 deficiency 09/24/2018   Normocytic anemia 07/20/2018   Primary osteoarthritis of right knee 02/04/2018  Status post total knee replacement using cement, left 02/04/2018   Obesity (BMI 35.0-39.9 without comorbidity) 01/18/2017   Primary osteoarthritis of left knee 01/18/2017   Cerebral  vascular insufficiency 06/06/2015   Dyslipidemia 06/06/2015   Aftercare following surgery of the circulatory system, NEC 08/03/2013   Occlusion and stenosis of carotid artery without mention of cerebral infarction 01/16/2013   Pre-operative cardiovascular examination 01/16/2013   Carotid artery stenosis, symptomatic 01/02/2013   Occlusion and stenosis of unspecified carotid artery 01/02/2013   CVA (cerebral infarction) 12/31/2012   HTN (hypertension) 12/31/2012   GERD (gastroesophageal reflux disease) 12/31/2012   Cerebral artery occlusion with cerebral infarction ([place]) 12/31/2012   Cerebral infarction ([place]) 12/31/2012   Esophageal reflux 12/31/2012   Essential hypertension 12/31/2012    Allergies  Allergen Reactions   Adhesive [Tape] Rash    Skin became RAW    Past Surgical History:  Procedure Laterality Date   CAROTID ENDARTERECTOMY     RIGHT  01/19/13   COLONOSCOPY  08-18-12   COLONOSCOPY WITH PROPOFOL N/A 03/10/2018   Procedure: COLONOSCOPY WITH PROPOFOL;  Surgeon: Lollie Sails, MD;  Location: [facility] ENDOSCOPY;  Service: Endoscopy;  Laterality: N/A;   DILATION AND CURETTAGE OF UTERUS     ENDARTERECTOMY Right 01/19/2013   Procedure: ENDARTERECTOMY CAROTID with patch angioplasty;  Surgeon: Conrad Baker, MD;  Location: [place];  Service: Vascular;  Laterality: Right;   ESOPHAGEAL DILATION     JOINT REPLACEMENT Left 04/15/2017   taotal knee arthorplasty Right 2021   TOTAL KNEE ARTHROPLASTY Right 12/01/2019   Procedure: TOTAL KNEE ARTHROPLASTY - RNFA;  Surgeon: Corky Mull, MD;  Location: [facility] ORS;  Service: Orthopedics;  Laterality: Right;   TUBAL LIGATION     UPPER GI ENDOSCOPY  08-18-12    Social History   Tobacco Use   Smoking status: Never   Smokeless tobacco: Never   Tobacco comments:    Smoking cessation materials not required  Vaping Use   Vaping Use: Never used  Substance Use Topics   Alcohol use: No    Alcohol/week: 0.0 standard drinks   Drug use: No      Medication list has been reviewed and updated.  Current Meds  Medication Sig   alendronate (FOSAMAX) 70 MG tablet TAKE 1 TABLET(70 MG) BY MOUTH 1 TIME A WEEK WITH A FULL GLASS OF WATER AND ON AN EMPTY STOMACH   Calcium Carbonate-Vitamin D3 600-400 MG-UNIT TABS Take 1 tablet by mouth daily.   clopidogrel (PLAVIX) 75 MG tablet TAKE 1 TABLET(75 MG) BY MOUTH DAILY   famotidine (PEPCID) 20 MG tablet TAKE 1 TABLET(20 MG) BY MOUTH TWICE DAILY   ibuprofen (ADVIL) 200 MG tablet Take by mouth.   Iron-Vitamin C 65-125 MG TABS Take 1 tablet by mouth 2 (two) times daily.   loratadine (CLARITIN) 10 MG tablet Take 1 tablet (10 mg total) by mouth continuous as needed for allergies.   losartan (COZAAR) 50 MG tablet TAKE 1 TABLET BY MOUTH DAILY   Multiple Vitamins-Minerals (CENTRUM SILVER ULTRA WOMENS) TABS Take 1 tablet by mouth daily.   simvastatin (ZOCOR) 20 MG tablet TAKE 1 TABLET(20 MG) BY MOUTH DAILY   vitamin B-12 (CYANOCOBALAMIN) 1000 MCG tablet Take 1,000 mcg by mouth once a week.    PHQ 2/9 Scores 07/19/2020 06/01/2020 01/01/2020 06/16/2019  PHQ - 2 Score 0 0 0 0  PHQ- 9 Score 0 - 4 0    GAD 7 : Generalized Anxiety Score 01/01/2020 06/16/2019  Nervous, Anxious, on Edge 0  0  Control/stop worrying 0 0  Worry too much - different things 0 0  Trouble relaxing 0 0  Restless 0 0  Easily annoyed or irritable 0 0  Afraid - awful might happen 0 0  Total GAD 7 Score 0 0    BP Readings from Last 3 Encounters:  08/09/20 (!) 169/91  07/19/20 132/70  04/05/20 (!) 158/76    Physical Exam Vitals and nursing note reviewed.  Constitutional:      General: She is not in acute distress.    Appearance: She is not diaphoretic.  HENT:     Head: Normocephalic and atraumatic.     Right Ear: Tympanic membrane, ear canal and external ear normal.     Left Ear: Tympanic membrane, ear canal and external ear normal.     Nose: Nose normal.  Eyes:     General:        Right eye: No discharge.         Left eye: No discharge.     Conjunctiva/sclera: Conjunctivae normal.     Pupils: Pupils are equal, round, and reactive to light.  Neck:     Thyroid: No thyromegaly.     Vascular: No JVD.  Cardiovascular:     Rate and Rhythm: Normal rate and regular rhythm.     Heart sounds: Normal heart sounds. No murmur heard.   No friction rub. No gallop.  Pulmonary:     Effort: Pulmonary effort is normal.     Breath sounds: Normal breath sounds.  Abdominal:     General: Bowel sounds are normal.     Palpations: Abdomen is soft. There is no mass.     Tenderness: There is no abdominal tenderness. There is no guarding.  Musculoskeletal:        General: Normal range of motion.     Cervical back: Normal range of motion and neck supple.  Lymphadenopathy:     Cervical: No cervical adenopathy.  Skin:    General: Skin is warm and dry.  Neurological:     Mental Status: She is alert.     Deep Tendon Reflexes: Reflexes are normal and symmetric.    Wt Readings from Last 3 Encounters:  01/17/21 213 lb (96.6 kg)  08/09/20 207 lb 3.7 oz (94 kg)  07/19/20 204 lb (92.5 kg)    Ht '5\' 4"'$  (1.626 m)   Wt 213 lb (96.6 kg)   BMI 36.56 kg/m   Assessment and Plan:  1. Essential hypertension Chronic.  Controlled.  Stable.  Blood pressure is 128/84.  Continue losartan 50 mg once a day. - losartan (COZAAR) 50 MG tablet; Take 1 tablet (50 mg total) by mouth daily.  Dispense: 90 tablet; Refill: 1  2. Localized osteoporosis without current pathological fracture .  Controlled.  Stable.  Continue Fosamax 70 mg once a week. - alendronate (FOSAMAX) 70 MG tablet; TAKE 1 TABLET(70 MG) BY MOUTH 1 TIME A WEEK WITH A FULL GLASS OF WATER AND ON AN EMPTY STOMACH  Dispense: 12 tablet; Refill: 1  3. Gastroesophageal reflux disease, unspecified whether esophagitis present Chronic.  Controlled.  Stable.  Symptoms are controlled with famotidine 20 mg 1 twice a day. - famotidine (PEPCID) 20 MG tablet; TAKE 1 TABLET(20 MG) BY  MOUTH TWICE DAILY  Dispense: 180 tablet; Refill: 1  4. Dyslipidemia Chronic.  Controlled.  Stable.  Continue simvastatin 20 mg once a day.  Will check lipid panel in that LDL and triglycerides were elevated on the previous lipid  panel. - simvastatin (ZOCOR) 20 MG tablet; Take 1 tablet daily  Dispense: 90 tablet; Refill: 1 - Lipid Panel With LDL/HDL Ratio  5. Benign paroxysmal positional vertigo, unspecified laterality Chronic.  Episodic.  Stable.  Patient has off-and-on symptoms of vertigo which is controlled with as needed Antivert 25 mg as needed - meclizine (ANTIVERT) 25 MG tablet; Take 1 tablet (25 mg total) by mouth 3 (three) times daily as needed for dizziness.  Dispense: 30 tablet; Refill: 0

## 2021-01-18 LAB — LIPID PANEL WITH LDL/HDL RATIO
Cholesterol, Total: 171 mg/dL (ref 100–199)
HDL: 59 mg/dL (ref >39)
LDL Chol Calc (NIH): 92 mg/dL (ref 0–99)
LDL/HDL Ratio: 1.6 ratio (ref 0.0–3.2)
Triglycerides: 115 mg/dL (ref 0–149)
VLDL Cholesterol Cal: 20 mg/dL (ref 5–40)

## 2021-01-23 ENCOUNTER — Other Ambulatory Visit: Payer: Self-pay

## 2021-01-23 ENCOUNTER — Telehealth: Payer: Self-pay

## 2021-01-23 DIAGNOSIS — H8110 Benign paroxysmal vertigo, unspecified ear: Secondary | ICD-10-CM

## 2021-01-23 MED ORDER — MECLIZINE HCL 25 MG PO TABS: 25.0000 mg | ORAL_TABLET | Freq: Three times a day (TID) | ORAL | 0 refills | Status: DC | PRN

## 2021-01-23 NOTE — Telephone Encounter (Signed)
Resent patients meclizine into pharmacy for filling.

## 2021-01-23 NOTE — Telephone Encounter (Signed)
Copied from [place] [phone]. Topic: General - Other >> Jan 20, 2021  4:41 PM Antonieta Iba C wrote: Reason for CRM: pt called in to make provider aware that her pharmacy is unable to fill her Rx for meclizine (ANTIVERT) 25 MG tablet. Pt says that she was told that her pharmacy that her Rx is expired. Pt would like further assistance with this.

## 2021-01-31 ENCOUNTER — Other Ambulatory Visit: Payer: Self-pay | Admitting: *Deleted

## 2021-01-31 DIAGNOSIS — E538 Deficiency of other specified B group vitamins: Secondary | ICD-10-CM

## 2021-01-31 DIAGNOSIS — D509 Iron deficiency anemia, unspecified: Secondary | ICD-10-CM

## 2021-02-03 ENCOUNTER — Other Ambulatory Visit: Payer: PPO

## 2021-02-03 ENCOUNTER — Other Ambulatory Visit: Payer: Self-pay

## 2021-02-03 ENCOUNTER — Inpatient Hospital Stay: Payer: PPO | Attending: Nurse Practitioner

## 2021-02-03 DIAGNOSIS — E538 Deficiency of other specified B group vitamins: Secondary | ICD-10-CM | POA: Diagnosis not present

## 2021-02-03 DIAGNOSIS — Z803 Family history of malignant neoplasm of breast: Secondary | ICD-10-CM | POA: Diagnosis not present

## 2021-02-03 DIAGNOSIS — Z791 Long term (current) use of non-steroidal anti-inflammatories (NSAID): Secondary | ICD-10-CM | POA: Diagnosis not present

## 2021-02-03 DIAGNOSIS — D509 Iron deficiency anemia, unspecified: Secondary | ICD-10-CM | POA: Diagnosis not present

## 2021-02-03 DIAGNOSIS — Z8719 Personal history of other diseases of the digestive system: Secondary | ICD-10-CM | POA: Insufficient documentation

## 2021-02-03 DIAGNOSIS — I10 Essential (primary) hypertension: Secondary | ICD-10-CM | POA: Diagnosis not present

## 2021-02-03 DIAGNOSIS — K219 Gastro-esophageal reflux disease without esophagitis: Secondary | ICD-10-CM | POA: Insufficient documentation

## 2021-02-03 DIAGNOSIS — E785 Hyperlipidemia, unspecified: Secondary | ICD-10-CM | POA: Diagnosis not present

## 2021-02-03 DIAGNOSIS — I6529 Occlusion and stenosis of unspecified carotid artery: Secondary | ICD-10-CM | POA: Insufficient documentation

## 2021-02-03 DIAGNOSIS — Z8673 Personal history of transient ischemic attack (TIA), and cerebral infarction without residual deficits: Secondary | ICD-10-CM | POA: Insufficient documentation

## 2021-02-03 DIAGNOSIS — Z79899 Other long term (current) drug therapy: Secondary | ICD-10-CM | POA: Insufficient documentation

## 2021-02-03 LAB — CBC WITH DIFFERENTIAL/PLATELET
Abs Immature Granulocytes: 0 10*3/uL (ref 0.00–0.07)
Basophils Absolute: 0 10*3/uL (ref 0.0–0.1)
Basophils Relative: 1 %
Eosinophils Absolute: 0.2 10*3/uL (ref 0.0–0.5)
Eosinophils Relative: 4 %
HCT: 36.4 % (ref 36.0–46.0)
Hemoglobin: 12.4 g/dL (ref 12.0–15.0)
Immature Granulocytes: 0 %
Lymphocytes Relative: 22 %
Lymphs Abs: 1 10*3/uL (ref 0.7–4.0)
MCH: 30.6 pg (ref 26.0–34.0)
MCHC: 34.1 g/dL (ref 30.0–36.0)
MCV: 89.9 fL (ref 80.0–100.0)
Monocytes Absolute: 0.4 10*3/uL (ref 0.1–1.0)
Monocytes Relative: 9 %
Neutro Abs: 3 10*3/uL (ref 1.7–7.7)
Neutrophils Relative %: 64 %
Platelets: 254 10*3/uL (ref 150–400)
RBC: 4.05 MIL/uL (ref 3.87–5.11)
RDW: 12.3 % (ref 11.5–15.5)
WBC: 4.6 10*3/uL (ref 4.0–10.5)
nRBC: 0 % (ref 0.0–0.2)

## 2021-02-03 LAB — IRON AND TIBC
Iron: 69 ug/dL (ref 28–170)
Saturation Ratios: 18 % (ref 10.4–31.8)
TIBC: 379 ug/dL (ref 250–450)
UIBC: 310 ug/dL

## 2021-02-03 LAB — VITAMIN B12: Vitamin B-12: 964 pg/mL — ABNORMAL HIGH (ref 180–914)

## 2021-02-03 LAB — FERRITIN: Ferritin: 32 ng/mL (ref 11–307)

## 2021-02-06 ENCOUNTER — Other Ambulatory Visit: Payer: PPO

## 2021-02-07 ENCOUNTER — Encounter: Payer: Self-pay | Admitting: Internal Medicine

## 2021-02-07 ENCOUNTER — Other Ambulatory Visit: Payer: Self-pay

## 2021-02-07 ENCOUNTER — Inpatient Hospital Stay: Payer: PPO | Admitting: Internal Medicine

## 2021-02-07 VITALS — BP 153/99 | HR 99 | Temp 97.2°F | Resp 18 | Wt 212.0 lb

## 2021-02-07 DIAGNOSIS — E611 Iron deficiency: Secondary | ICD-10-CM | POA: Diagnosis not present

## 2021-02-07 DIAGNOSIS — E538 Deficiency of other specified B group vitamins: Secondary | ICD-10-CM

## 2021-02-07 DIAGNOSIS — D509 Iron deficiency anemia, unspecified: Secondary | ICD-10-CM | POA: Diagnosis not present

## 2021-02-07 NOTE — Progress Notes (Signed)
[place] NOTE  Patient Care Team: Juline Patch, MD as PCP - General (Family Medicine) Lollie Sails, MD (Inactive) as Consulting Physician (Gastroenterology) Reche Dixon, PA-C (Orthopedic Surgery) Leanor Kail, MD (Inactive) as Consulting Physician (Orthopedic Surgery) Ammie Dalton, Okey Regal, MD as Consulting Physician (Unknown Physician Specialty) Juline Patch, MD (Family Medicine)  CHIEF COMPLAINTS/PURPOSE OF CONSULTATION:  Iron/B12 deficiency  #  Oncology History   No history exists.     HISTORY OF PRESENTING ILLNESS:  Ellen Baker 70 y.o.  female history of iron deficiency has B12 deficiency is here for follow-up.  Patient has been on oral iron supplementation Vitron-C twice a day.  She is also on B12 supplementation once a week.  Patient denies any blood in stools black or stools daily nausea vomiting.  Has any tingling or numbness.  No falls.  Review of Systems  Constitutional:  Negative for chills, diaphoresis, fever, malaise/fatigue and weight loss.  HENT:  Negative for nosebleeds and sore throat.   Eyes:  Negative for double vision.  Respiratory:  Negative for cough, hemoptysis, sputum production, shortness of breath and wheezing.   Cardiovascular:  Negative for chest pain, palpitations, orthopnea and leg swelling.  Gastrointestinal:  Negative for abdominal pain, blood in stool, constipation, diarrhea, heartburn, melena, nausea and vomiting.  Genitourinary:  Negative for dysuria, frequency and urgency.  Musculoskeletal:  Negative for back pain and joint pain.  Skin: Negative.  Negative for itching and rash.  Neurological:  Negative for dizziness, tingling, focal weakness, weakness and headaches.  Endo/Heme/Allergies:  Does not bruise/bleed easily.  Psychiatric/Behavioral:  Negative for depression. The patient is not nervous/anxious and does not have insomnia.     MEDICAL HISTORY:  Past Medical History:  Diagnosis Date   . Arthritis    right knee  . Carotid artery occlusion   . Dizziness   . GERD (gastroesophageal reflux disease)   . Heart murmur   . History of colon polyps   . Hyperlipidemia   . Hypertension   . Iron deficiency   . Stroke [facility]) 12-31-12   no residual effects    SURGICAL HISTORY: Past Surgical History:  Procedure Laterality Date  . CAROTID ENDARTERECTOMY     RIGHT  01/19/13  . COLONOSCOPY  08-18-12  . COLONOSCOPY WITH PROPOFOL N/A 03/10/2018   Procedure: COLONOSCOPY WITH PROPOFOL;  Surgeon: Lollie Sails, MD;  Location: [facility] ENDOSCOPY;  Service: Endoscopy;  Laterality: N/A;  . DILATION AND CURETTAGE OF UTERUS    . ENDARTERECTOMY Right 01/19/2013   Procedure: ENDARTERECTOMY CAROTID with patch angioplasty;  Surgeon: Conrad Gold Hill, MD;  Location: [place];  Service: Vascular;  Laterality: Right;  . ESOPHAGEAL DILATION    . JOINT REPLACEMENT Left 04/15/2017  . taotal knee arthorplasty Right 2021  . TOTAL KNEE ARTHROPLASTY Right 12/01/2019   Procedure: TOTAL KNEE ARTHROPLASTY - RNFA;  Surgeon: Corky Mull, MD;  Location: [facility] ORS;  Service: Orthopedics;  Laterality: Right;  . TUBAL LIGATION    . UPPER GI ENDOSCOPY  08-18-12    SOCIAL HISTORY: Social History   Socioeconomic History  . Marital status: Widowed    Spouse name: Not on file  . Number of children: 3  . Years of education: Not on file  . Highest education level: 12th grade  Occupational History    Employer: [employer]  Tobacco Use  . Smoking status: Never  . Smokeless tobacco: Never  . Tobacco comments:    Smoking cessation materials not required  Vaping Use  . Vaping Use: Never used  Substance and Sexual Activity  . Alcohol use: No    Alcohol/week: 0.0 standard drinks  . Drug use: No  . Sexual activity: Never  Other Topics Concern  . Not on file  Social History Narrative   Pt lives alone.    Social Determinants of Health   Financial Resource Strain: Low Risk   . Difficulty of Paying Living Expenses:  Not hard at all  Food Insecurity: No Food Insecurity  . Worried About Charity fundraiser in the Last Year: Never true  . Ran Out of Food in the Last Year: Never true  Transportation Needs: No Transportation Needs  . Lack of Transportation (Medical): No  . Lack of Transportation (Non-Medical): No  Physical Activity: Inactive  . Days of Exercise per Week: 0 days  . Minutes of Exercise per Session: 0 min  Stress: No Stress Concern Present  . Feeling of Stress : Not at all  Social Connections: Moderately Isolated  . Frequency of Communication with Friends and Family: More than three times a week  . Frequency of Social Gatherings with Friends and Family: Three times a week  . Attends Religious Services: More than 4 times per year  . Active Member of Clubs or Organizations: No  . Attends Archivist Meetings: Never  . Marital Status: Widowed  Intimate Partner Violence: Not At Risk  . Fear of Current or Ex-Partner: No  . Emotionally Abused: No  . Physically Abused: No  . Sexually Abused: No    FAMILY HISTORY: Family History  Problem Relation Age of Onset  . Stroke Mother   . Deep vein thrombosis Mother   . Heart disease Mother        Amputation-  . Hypertension Mother   . Alzheimer's disease Mother   . Heart failure Father   . Heart disease Father        CHF  . Hypertension Father   . Varicose Veins Father   . Parkinson's disease Father   . Breast cancer Paternal Aunt 17    ALLERGIES:  is allergic to adhesive [tape].  MEDICATIONS:  Current Outpatient Medications  Medication Sig Dispense Refill  . alendronate (FOSAMAX) 70 MG tablet TAKE 1 TABLET(70 MG) BY MOUTH 1 TIME A WEEK WITH A FULL GLASS OF WATER AND ON AN EMPTY STOMACH 12 tablet 1  . Calcium Carbonate-Vitamin D3 600-400 MG-UNIT TABS Take 1 tablet by mouth daily.    . clopidogrel (PLAVIX) 75 MG tablet Take 1 tablet by mouth daily 90 tablet 1  . famotidine (PEPCID) 20 MG tablet TAKE 1 TABLET(20 MG) BY MOUTH  TWICE DAILY 180 tablet 1  . ibuprofen (ADVIL) 200 MG tablet Take by mouth.    . Iron-Vitamin C 65-125 MG TABS Take 1 tablet by mouth 2 (two) times daily. 60 tablet 1  . loratadine (CLARITIN) 10 MG tablet Take 1 tablet (10 mg total) by mouth continuous as needed for allergies. 30 tablet 6  . losartan (COZAAR) 50 MG tablet Take 1 tablet (50 mg total) by mouth daily. 90 tablet 1  . meclizine (ANTIVERT) 25 MG tablet Take 1 tablet (25 mg total) by mouth 3 (three) times daily as needed for dizziness. 30 tablet 0  . Multiple Vitamins-Minerals (CENTRUM SILVER ULTRA WOMENS) TABS Take 1 tablet by mouth daily.    . simvastatin (ZOCOR) 20 MG tablet Take 1 tablet daily 90 tablet 1  . vitamin B-12 (CYANOCOBALAMIN) 1000 MCG tablet Take  1,000 mcg by mouth once a week.     No current facility-administered medications for this visit.      Marland Kitchen  PHYSICAL EXAMINATION: ECOG PERFORMANCE STATUS: 0 - Asymptomatic  Vitals:   02/07/21 1043  BP: (!) 153/99  Pulse: 99  Resp: 18  Temp: (!) 97.2 F (36.2 C)  SpO2: 96%   Filed Weights   02/07/21 1043  Weight: 211 lb 15.6 oz (96.2 kg)    Physical Exam Vitals and nursing note reviewed.  Constitutional:      Comments:     HENT:     Head: Normocephalic and atraumatic.     Mouth/Throat:     Mouth: Mucous membranes are moist.     Pharynx: No oropharyngeal exudate.  Eyes:     Pupils: Pupils are equal, round, and reactive to light.  Cardiovascular:     Rate and Rhythm: Normal rate and regular rhythm.  Pulmonary:     Effort: No respiratory distress.     Breath sounds: No wheezing.     Comments: Decreased breath sounds bilaterally at bases.  No wheeze or crackles Abdominal:     General: Bowel sounds are normal. There is no distension.     Palpations: Abdomen is soft. There is no mass.     Tenderness: no abdominal tenderness There is no guarding or rebound.  Musculoskeletal:        General: No tenderness. Normal range of motion.     Cervical back:  Normal range of motion and neck supple.  Skin:    General: Skin is warm.  Neurological:     Mental Status: She is alert and oriented to person, place, and time.  Psychiatric:        Mood and Affect: Affect normal.        Judgment: Judgment normal.    LABORATORY DATA:  I have reviewed the data as listed Lab Results  Component Value Date   WBC 4.6 02/03/2021   HGB 12.4 02/03/2021   HCT 36.4 02/03/2021   MCV 89.9 02/03/2021   PLT 254 02/03/2021   Recent Labs    07/19/20 1122  NA 140  K 4.1  CL 101  CO2 29  GLUCOSE 89  BUN 9  CREATININE 0.70  CALCIUM 9.5  GFRNONAA 89  GFRAA 102  PROT 7.0  ALBUMIN 4.2  AST 13  ALT 10  ALKPHOS 92  BILITOT 0.4    RADIOGRAPHIC STUDIES: I have personally reviewed the radiological images as listed and agreed with the findings in the report. No results found.  ASSESSMENT & PLAN:   Iron deficiency # iron deficiency [ferritin- AUG 2022- 32' iron sat-18%] Plavix; Vitron-C BID [liver/onion].  Hold off IV iron.    # ETIOLOGY: EGD on 08/18/2012; Colonoscopy on 03/10/2018 revealed diverticulosis in the sigmoid colon, in the descending colon, in the transverse colon and in the ascending colon.  There was one 3 mm polyp at the hepatic flexure (tubular adenoma) and two 4 to 6 mm polyps (hyperplastic) in the sigmoid colon.   # B12 deficiency: B12 pill once a week.   # DISPOSITION: # follow up in 6 months- MD; 2-3 days prior-labs- cbc/cmp/B12 levels; Iron studies/ferritin- Dr.B   All questions were answered. The patient knows to call the clinic with any problems, questions or concerns.     Cammie Sickle, MD 02/07/2021 12:34 PM

## 2021-02-07 NOTE — Assessment & Plan Note (Addendum)
#   iron deficiency [ferritin- AUG 2022- 32' iron sat-18%] Plavix; Vitron-C BID [liver/onion].  Hold off IV iron.   # ETIOLOGY: EGDon 08/18/2012; Colonoscopyon 03/10/2018 revealed diverticulosis in the sigmoid colon, in the descending colon, in the transverse colon and in the ascending colon. There was one 3 mm polyp at the hepatic flexure (tubular adenoma) and two 4 to 6 mm polyps (hyperplastic) in the sigmoid colon.  # B12 deficiency: B12 pill once a week.  # DISPOSITION: # follow up in 6 months- MD; 2-3 days prior-labs- cbc/cmp/B12 levels; Iron studies/ferritin- Dr.B

## 2021-02-17 ENCOUNTER — Other Ambulatory Visit (INDEPENDENT_AMBULATORY_CARE_PROVIDER_SITE_OTHER): Payer: Self-pay | Admitting: Vascular Surgery

## 2021-02-17 DIAGNOSIS — I6523 Occlusion and stenosis of bilateral carotid arteries: Secondary | ICD-10-CM

## 2021-02-21 ENCOUNTER — Ambulatory Visit (INDEPENDENT_AMBULATORY_CARE_PROVIDER_SITE_OTHER): Payer: PPO | Admitting: Vascular Surgery

## 2021-02-21 ENCOUNTER — Other Ambulatory Visit: Payer: Self-pay

## 2021-02-21 ENCOUNTER — Other Ambulatory Visit: Payer: Self-pay | Admitting: Family Medicine

## 2021-02-21 ENCOUNTER — Ambulatory Visit (INDEPENDENT_AMBULATORY_CARE_PROVIDER_SITE_OTHER): Payer: PPO

## 2021-02-21 VITALS — BP 162/82 | HR 84 | Ht 64.0 in | Wt 212.0 lb

## 2021-02-21 DIAGNOSIS — I10 Essential (primary) hypertension: Secondary | ICD-10-CM | POA: Diagnosis not present

## 2021-02-21 DIAGNOSIS — I6523 Occlusion and stenosis of bilateral carotid arteries: Secondary | ICD-10-CM | POA: Diagnosis not present

## 2021-02-21 DIAGNOSIS — Z1231 Encounter for screening mammogram for malignant neoplasm of breast: Secondary | ICD-10-CM

## 2021-02-21 DIAGNOSIS — E785 Hyperlipidemia, unspecified: Secondary | ICD-10-CM | POA: Diagnosis not present

## 2021-02-21 NOTE — Assessment & Plan Note (Signed)
Duplex today shows a widely patent right carotid endarterectomy and stable 1 to 39% left ICA stenosis.  Doing well.  Continue current medical regimen.  Recheck in 1 year.

## 2021-02-21 NOTE — Progress Notes (Signed)
MRN : [id number]  Wauzeka is a 70 y.o. ([birth year]) female who presents with chief complaint of  Chief Complaint  Patient presents with   Follow-up    Fol 1  yr carotid   .  History of Present Illness: Patient returns in follow-up of her carotid disease.  She is several years status post right carotid endarterectomy for high-grade symptomatic stenosis at another institution.  She is doing well with no problems or issues since her last visit.  She denies any current focal neurologic symptoms. Specifically, the patient denies amaurosis fugax, speech or swallowing difficulties, or arm or leg weakness or numbness Duplex today shows a widely patent right carotid endarterectomy and stable 1 to 39% left ICA stenosis.   Current Outpatient Medications  Medication Sig Dispense Refill   alendronate (FOSAMAX) 70 MG tablet TAKE 1 TABLET(70 MG) BY MOUTH 1 TIME A WEEK WITH A FULL GLASS OF WATER AND ON AN EMPTY STOMACH 12 tablet 1   Calcium Carbonate-Vitamin D3 600-400 MG-UNIT TABS Take 1 tablet by mouth daily.     clopidogrel (PLAVIX) 75 MG tablet Take 1 tablet by mouth daily 90 tablet 1   famotidine (PEPCID) 20 MG tablet TAKE 1 TABLET(20 MG) BY MOUTH TWICE DAILY 180 tablet 1   ibuprofen (ADVIL) 200 MG tablet Take by mouth.     Iron-Vitamin C 65-125 MG TABS Take 1 tablet by mouth 2 (two) times daily. 60 tablet 1   loratadine (CLARITIN) 10 MG tablet Take 1 tablet (10 mg total) by mouth continuous as needed for allergies. 30 tablet 6   losartan (COZAAR) 50 MG tablet Take 1 tablet (50 mg total) by mouth daily. 90 tablet 1   meclizine (ANTIVERT) 25 MG tablet Take 1 tablet (25 mg total) by mouth 3 (three) times daily as needed for dizziness. 30 tablet 0   Multiple Vitamins-Minerals (CENTRUM SILVER ULTRA WOMENS) TABS Take 1 tablet by mouth daily.     simvastatin (ZOCOR) 20 MG tablet Take 1 tablet daily 90 tablet 1   vitamin B-12 (CYANOCOBALAMIN) 1000 MCG tablet Take 1,000 mcg by mouth once a  week.     No current facility-administered medications for this visit.    Past Medical History:  Diagnosis Date   Arthritis    right knee   Carotid artery occlusion    Dizziness    GERD (gastroesophageal reflux disease)    Heart murmur    History of colon polyps    Hyperlipidemia    Hypertension    Iron deficiency    Stroke [facility]) 12-31-12   no residual effects    Past Surgical History:  Procedure Laterality Date   CAROTID ENDARTERECTOMY     RIGHT  01/19/13   COLONOSCOPY  08-18-12   COLONOSCOPY WITH PROPOFOL N/A 03/10/2018   Procedure: COLONOSCOPY WITH PROPOFOL;  Surgeon: Lollie Sails, MD;  Location: [facility] ENDOSCOPY;  Service: Endoscopy;  Laterality: N/A;   DILATION AND CURETTAGE OF UTERUS     ENDARTERECTOMY Right 01/19/2013   Procedure: ENDARTERECTOMY CAROTID with patch angioplasty;  Surgeon: Conrad [place], MD;  Location: [place];  Service: Vascular;  Laterality: Right;   ESOPHAGEAL DILATION     JOINT REPLACEMENT Left 04/15/2017   taotal knee arthorplasty Right 2021   TOTAL KNEE ARTHROPLASTY Right 12/01/2019   Procedure: TOTAL KNEE ARTHROPLASTY - RNFA;  Surgeon: Corky Mull, MD;  Location: [facility] ORS;  Service: Orthopedics;  Laterality: Right;   TUBAL LIGATION     UPPER GI ENDOSCOPY  08-18-12     Social History   Tobacco Use   Smoking status: Never   Smokeless tobacco: Never   Tobacco comments:    Smoking cessation materials not required  Vaping Use   Vaping Use: Never used  Substance Use Topics   Alcohol use: No    Alcohol/week: 0.0 standard drinks   Drug use: No      Family History  Problem Relation Age of Onset   Stroke Mother    Deep vein thrombosis Mother    Heart disease Mother        Amputation-   Hypertension Mother    Alzheimer's disease Mother    Heart failure Father    Heart disease Father        CHF   Hypertension Father    Varicose Veins Father    Parkinson's disease Father    Breast cancer Paternal Aunt 14     Allergies  Allergen  Reactions   Adhesive [Tape] Rash    Skin became RAW    REVIEW OF SYSTEMS (Negative unless checked)   Constitutional: '[]'$ Weight loss  '[]'$ Fever  '[]'$ Chills Cardiac: '[]'$ Chest pain   '[]'$ Chest pressure   '[]'$ Palpitations   '[]'$ Shortness of breath when laying flat   '[]'$ Shortness of breath at rest   '[]'$ Shortness of breath with exertion. Vascular:  '[]'$ Pain in legs with walking   '[]'$ Pain in legs at rest   '[]'$ Pain in legs when laying flat   '[]'$ Claudication   '[]'$ Pain in feet when walking  '[]'$ Pain in feet at rest  '[]'$ Pain in feet when laying flat   '[]'$ History of DVT   '[]'$ Phlebitis   '[]'$ Swelling in legs   '[]'$ Varicose veins   '[]'$ Non-healing ulcers Pulmonary:   '[]'$ Uses home oxygen   '[]'$ Productive cough   '[]'$ Hemoptysis   '[]'$ Wheeze  '[]'$ COPD   '[]'$ Asthma Neurologic:  '[x]'$ Dizziness  '[]'$ Blackouts   '[]'$ Seizures   '[x]'$ History of stroke   '[]'$ History of TIA  '[]'$ Aphasia   '[]'$ Temporary blindness   '[]'$ Dysphagia   '[]'$ Weakness or numbness in arms   '[]'$ Weakness or numbness in legs Musculoskeletal:  '[x]'$ Arthritis   '[]'$ Joint swelling   '[]'$ Joint pain   '[]'$ Low back pain Hematologic:  '[]'$ Easy bruising  '[]'$ Easy bleeding   '[]'$ Hypercoagulable state   '[x]'$ Anemic  '[]'$ Hepatitis Gastrointestinal:  '[]'$ Blood in stool   '[]'$ Vomiting blood  '[x]'$ Gastroesophageal reflux/heartburn   '[]'$ Abdominal pain Genitourinary:  '[]'$ Chronic kidney disease   '[]'$ Difficult urination  '[]'$ Frequent urination  '[]'$ Burning with urination   '[]'$ Hematuria Skin:  '[]'$ Rashes   '[]'$ Ulcers   '[]'$ Wounds Psychological:  '[]'$ History of anxiety   '[]'$  History of major depression.  Physical Examination  Vitals:   02/21/21 1112  BP: (!) 162/82  Pulse: 84  Weight: 212 lb (96.2 kg)  Height: '5\' 4"'$  (1.626 m)   Body mass index is 36.39 kg/m. Gen:  WD/WN, NAD Head: [place]/AT, No temporalis wasting. Ear/Nose/Throat: Hearing grossly intact, nares w/o erythema or drainage, trachea midline Eyes: Conjunctiva clear. Sclera non-icteric Neck: Supple.  No bruit  Pulmonary:  Good air movement, equal and clear to auscultation bilaterally.  Cardiac: RRR,  No JVD Vascular:  Vessel Right Left  Radial Palpable Palpable       Musculoskeletal: M/S 5/5 throughout.  No deformity or atrophy. Mild LE edema. Neurologic: CN 2-12 intact. Sensation grossly intact in extremities.  Symmetrical.  Speech is fluent. Motor exam as listed above. Psychiatric: Judgment intact, Mood & affect appropriate for pt's clinical situation. Dermatologic: No rashes or ulcers noted.  No cellulitis or open wounds.     CBC  Lab Results  Component Value Date   WBC 4.6 02/03/2021   HGB 12.4 02/03/2021   HCT 36.4 02/03/2021   MCV 89.9 02/03/2021   PLT 254 02/03/2021    BMET    Component Value Date/Time   NA 140 07/19/2020 1122   NA 139 12/31/2012 1652   K 4.1 07/19/2020 1122   K 3.4 (L) 12/31/2012 1652   CL 101 07/19/2020 1122   CL 105 12/31/2012 1652   CO2 29 07/19/2020 1122   CO2 28 12/31/2012 1652   GLUCOSE 89 07/19/2020 1122   GLUCOSE 119 (H) 12/03/2019 0557   GLUCOSE 93 12/31/2012 1652   BUN 9 07/19/2020 1122   BUN 10 12/31/2012 1652   CREATININE 0.70 07/19/2020 1122   CREATININE 0.79 12/31/2012 1652   CALCIUM 9.5 07/19/2020 1122   CALCIUM 9.1 12/31/2012 1652   GFRNONAA 89 07/19/2020 1122   GFRNONAA >60 12/31/2012 1652   GFRAA 102 07/19/2020 1122   GFRAA >60 12/31/2012 1652   CrCl cannot be calculated (Patient's most recent lab result is older than the maximum 21 days allowed.).  COAG Lab Results  Component Value Date   INR 1.07 01/16/2013   INR 1.0 12/31/2012    Radiology VAS US CAROTID  Result Date: 02/21/2021 Carotid Arterial Duplex Study Patient Name:  REYNALDO VOLTAIRE Casco  Date of Exam:   02/21/2021 Medical Rec #: [id number]             Accession #:    [id number] Date of Birth: [birth year]              Patient Gender: F Patient Age:   89 years Exam Location:  [facility] Procedure:      VAS US CAROTID Referring Phys: Leotis Pain --------------------------------------------------------------------------------  Comparison Study:   02/23/2020 Performing Technologist: Charlane Ferretti RT (R)(VS)  Examination Guidelines: A complete evaluation includes B-mode imaging, spectral Doppler, color Doppler, and power Doppler as needed of all accessible portions of each vessel. Bilateral testing is considered an integral part of a complete examination. Limited examinations for reoccurring indications may be performed as noted.  Right Carotid Findings: +----------+--------+--------+--------+------------------+---------------------+           PSV cm/sEDV cm/sStenosisPlaque DescriptionComments              +----------+--------+--------+--------+------------------+---------------------+ CCA Prox  162     18                                intimal thickening,                                                       tortuous              +----------+--------+--------+--------+------------------+---------------------+ CCA Mid   89      17                                                      +----------+--------+--------+--------+------------------+---------------------+ CCA Distal103     27                                                      +----------+--------+--------+--------+------------------+---------------------+  ICA Prox  80      17                                                      +----------+--------+--------+--------+------------------+---------------------+ ICA Mid   80      27                                tortuous              +----------+--------+--------+--------+------------------+---------------------+ ICA Distal95      35                                ICA/CCA ratio = 1.10  +----------+--------+--------+--------+------------------+---------------------+ ECA       131     102                                                     +----------+--------+--------+--------+------------------+---------------------+ +----------+--------+-------+-----------+-------------------+           PSV  cm/sEDV cmsDescribe   Arm Pressure (mmHG) +----------+--------+-------+-----------+-------------------+ [id number]            Multiphasic                    +----------+--------+-------+-----------+-------------------+ +---------+--------+--+--------+--+---------+ VertebralPSV cm/s57EDV cm/s18Antegrade +---------+--------+--+--------+--+---------+ Left Carotid Findings: +----------+--------+--------+--------+------------------+--------------------+           PSV cm/sEDV cm/sStenosisPlaque DescriptionComments             +----------+--------+--------+--------+------------------+--------------------+ CCA Prox  86      23                                intimal thickening   +----------+--------+--------+--------+------------------+--------------------+ CCA Mid   75      20                                intimal thickening   +----------+--------+--------+--------+------------------+--------------------+ CCA Distal90      22              calcific          intimal thickening   +----------+--------+--------+--------+------------------+--------------------+ ICA Prox  99      32                                                     +----------+--------+--------+--------+------------------+--------------------+ ICA Mid   105     35                                ICA/CCA ratio = 1.44 +----------+--------+--------+--------+------------------+--------------------+ ICA Distal108     38                                                     +----------+--------+--------+--------+------------------+--------------------+  ECA       183     24                                                     +----------+--------+--------+--------+------------------+--------------------+ +----------+--------+--------+-----------+-------------------+           PSV cm/sEDV cm/sDescribe   Arm Pressure (mmHG) +----------+--------+--------+-----------+-------------------+  [id number]             Multiphasic                    +----------+--------+--------+-----------+-------------------+ +---------+--------+--+--------+--+---------+ VertebralPSV cm/s66EDV cm/s19Antegrade +---------+--------+--+--------+--+---------+ Summary: Right Carotid: Velocities in the right ICA are consistent with a 1-39% stenosis.                The ECA appears <50% stenosed. Left Carotid: Velocities in the left ICA are consistent with a 1-39% stenosis.               The ECA appears >50% stenosed. Vertebrals:  Bilateral vertebral arteries demonstrate antegrade flow. Subclavians: Normal flow hemodynamics were seen in bilateral subclavian              arteries. *See table(s) above for measurements and observations.  Electronically signed by Leotis Pain MD on 02/21/2021 at 11:13:37 AM.    Final      Assessment/Plan Essential hypertension blood pressure control important in reducing the progression of atherosclerotic disease. On appropriate oral medications.     Dyslipidemia lipid control important in reducing the progression of atherosclerotic disease. Continue statin therapy  Carotid artery stenosis, symptomatic Duplex today shows a widely patent right carotid endarterectomy and stable 1 to 39% left ICA stenosis.  Doing well.  Continue current medical regimen.  Recheck in 1 year.    Leotis Pain, MD  02/21/2021 12:12 PM    This note was created with Dragon medical transcription system.  Any errors from dictation are purely unintentional

## 2021-03-03 DIAGNOSIS — H2513 Age-related nuclear cataract, bilateral: Secondary | ICD-10-CM | POA: Diagnosis not present

## 2021-04-10 ENCOUNTER — Other Ambulatory Visit: Payer: Self-pay | Admitting: Family Medicine

## 2021-04-10 DIAGNOSIS — E785 Hyperlipidemia, unspecified: Secondary | ICD-10-CM

## 2021-04-10 DIAGNOSIS — I10 Essential (primary) hypertension: Secondary | ICD-10-CM

## 2021-04-11 NOTE — Telephone Encounter (Signed)
Requested medications are due for refill today yes  Requested medications are on the active medication list yes  Last refill 01/01/21  Last visit 01/17/21  Future visit scheduled 07/21/21  Notes to clinic .failed protocols due to lab work out of date, please assess.  Requested Prescriptions  Pending Prescriptions Disp Refills   clopidogrel (PLAVIX) 75 MG tablet [Pharmacy Med Name: CLOPIDOGREL 75MG  TABLETS] 90 tablet 1    Sig: TAKE 1 TABLET(75 MG) BY MOUTH DAILY     Hematology: Antiplatelets - clopidogrel Failed - 04/10/2021  9:25 PM      Failed - Evaluate AST, ALT within 2 months of therapy initiation.      Passed - ALT in normal range and within 360 days    ALT  Date Value Ref Range Status  07/19/2020 10 0 - 32 IU/L Final   SGPT (ALT)  Date Value Ref Range Status  12/31/2012 19 12 - 78 U/L Final          Passed - AST in normal range and within 360 days    AST  Date Value Ref Range Status  07/19/2020 13 0 - 40 IU/L Final   SGOT(AST)  Date Value Ref Range Status  12/31/2012 26 15 - 37 Unit/L Final          Passed - HCT in normal range and within 180 days    HCT  Date Value Ref Range Status  02/03/2021 36.4 36.0 - 46.0 % Final   Hematocrit  Date Value Ref Range Status  06/06/2018 30.3 (L) 34.0 - 46.6 % Final          Passed - HGB in normal range and within 180 days    Hemoglobin  Date Value Ref Range Status  02/03/2021 12.4 12.0 - 15.0 g/dL Final  06/27/2018 9.6 (L) 11.1 - 15.9 g/dL Final          Passed - PLT in normal range and within 180 days    Platelets  Date Value Ref Range Status  02/03/2021 254 150 - 400 K/uL Final  06/06/2018 290 150 - 450 x10E3/uL Final          Passed - Valid encounter within last 6 months    Recent Outpatient Visits           2 months ago Essential hypertension   [facility] Juline Patch, MD   8 months ago Essential hypertension   [facility] Juline Patch, MD   1 year ago Essential  hypertension   [facility] Juline Patch, MD   1 year ago Essential hypertension   [facility] Juline Patch, MD   2 years ago Essential hypertension   [facility] Juline Patch, MD       Future Appointments             In 3 months Juline Patch, MD [facility], [facility]             losartan (COZAAR) 50 MG tablet [Pharmacy Med Name: LOSARTAN 50MG  TABLETS] 90 tablet 1    Sig: TAKE 1 TABLET BY MOUTH DAILY     Cardiovascular:  Angiotensin Receptor Blockers Failed - 04/10/2021  9:25 PM      Failed - Cr in normal range and within 180 days    Creatinine  Date Value Ref Range Status  12/31/2012 0.79 0.60 - 1.30 mg/dL Final   Creatinine, Ser  Date Value Ref Range Status  07/19/2020 0.70 0.57 - 1.00 mg/dL Final          Failed - K in normal range and within 180 days    Potassium  Date Value Ref Range Status  07/19/2020 4.1 3.5 - 5.2 mmol/L Final  12/31/2012 3.4 (L) 3.5 - 5.1 mmol/L Final          Failed - Last BP in normal range    BP Readings from Last 1 Encounters:  02/21/21 (!) 162/82          Passed - Patient is not pregnant      Passed - Valid encounter within last 6 months    Recent Outpatient Visits           2 months ago Essential hypertension   [facility] Juline Patch, MD   8 months ago Essential hypertension   [facility] Juline Patch, MD   1 year ago Essential hypertension   [facility] Juline Patch, MD   1 year ago Essential hypertension   [facility] Juline Patch, MD   2 years ago Essential hypertension   [place], Deanna C, MD       Future Appointments             In 3 months Juline Patch, MD [facility], [facility]            Signed Prescriptions Disp Refills   simvastatin (ZOCOR) 20 MG tablet 90 tablet 1    Sig: TAKE 1 TABLET(20 MG) BY MOUTH DAILY     Cardiovascular:  Antilipid - Statins Passed - 04/10/2021   9:25 PM      Passed - Total Cholesterol in normal range and within 360 days    Cholesterol, Total  Date Value Ref Range Status  01/17/2021 171 100 - 199 mg/dL Final          Passed - LDL in normal range and within 360 days    LDL Chol Calc (NIH)  Date Value Ref Range Status  01/17/2021 92 0 - 99 mg/dL Final          Passed - HDL in normal range and within 360 days    HDL  Date Value Ref Range Status  01/17/2021 59 >39 mg/dL Final          Passed - Triglycerides in normal range and within 360 days    Triglycerides  Date Value Ref Range Status  01/17/2021 115 0 - 149 mg/dL Final          Passed - Patient is not pregnant      Passed - Valid encounter within last 12 months    Recent Outpatient Visits           2 months ago Essential hypertension   [place], Deanna C, MD   8 months ago Essential hypertension   [place], Deanna C, MD   1 year ago Essential hypertension   [place], Deanna C, MD   1 year ago Essential hypertension   [place], Deanna C, MD   2 years ago Essential hypertension   [place], Deanna C, MD       Future Appointments             In 3 months Juline Patch, MD [facility], [facility]

## 2021-04-11 NOTE — Telephone Encounter (Signed)
Requested Prescriptions  Pending Prescriptions Disp Refills  . clopidogrel (PLAVIX) 75 MG tablet [Pharmacy Med Name: CLOPIDOGREL 75MG  TABLETS] 90 tablet 1    Sig: TAKE 1 TABLET(75 MG) BY MOUTH DAILY     Hematology: Antiplatelets - clopidogrel Failed - 04/10/2021  9:25 PM      Failed - Evaluate AST, ALT within 2 months of therapy initiation.      Passed - ALT in normal range and within 360 days    ALT  Date Value Ref Range Status  07/19/2020 10 0 - 32 IU/L Final   SGPT (ALT)  Date Value Ref Range Status  12/31/2012 19 12 - 78 U/L Final         Passed - AST in normal range and within 360 days    AST  Date Value Ref Range Status  07/19/2020 13 0 - 40 IU/L Final   SGOT(AST)  Date Value Ref Range Status  12/31/2012 26 15 - 37 Unit/L Final         Passed - HCT in normal range and within 180 days    HCT  Date Value Ref Range Status  02/03/2021 36.4 36.0 - 46.0 % Final   Hematocrit  Date Value Ref Range Status  06/06/2018 30.3 (L) 34.0 - 46.6 % Final         Passed - HGB in normal range and within 180 days    Hemoglobin  Date Value Ref Range Status  02/03/2021 12.4 12.0 - 15.0 g/dL Final  06/27/2018 9.6 (L) 11.1 - 15.9 g/dL Final         Passed - PLT in normal range and within 180 days    Platelets  Date Value Ref Range Status  02/03/2021 254 150 - 400 K/uL Final  06/06/2018 290 150 - 450 x10E3/uL Final         Passed - Valid encounter within last 6 months    Recent Outpatient Visits          2 months ago Essential hypertension   [place], Deanna C, MD   8 months ago Essential hypertension   [place], Deanna C, MD   1 year ago Essential hypertension   [place], Deanna C, MD   1 year ago Essential hypertension   [place], Deanna C, MD   2 years ago Essential hypertension   [place], Deanna C, MD      Future Appointments            In 3 months Juline Patch, MD  [facility], [place]           . simvastatin (ZOCOR) 20 MG tablet [Pharmacy Med Name: SIMVASTATIN 20MG  TABLETS] 90 tablet 1    Sig: TAKE 1 TABLET(20 MG) BY MOUTH DAILY     Cardiovascular:  Antilipid - Statins Passed - 04/10/2021  9:25 PM      Passed - Total Cholesterol in normal range and within 360 days    Cholesterol, Total  Date Value Ref Range Status  01/17/2021 171 100 - 199 mg/dL Final         Passed - LDL in normal range and within 360 days    LDL Chol Calc (NIH)  Date Value Ref Range Status  01/17/2021 92 0 - 99 mg/dL Final         Passed - HDL in normal range and within 360 days    HDL  Date Value  Ref Range Status  01/17/2021 59 >39 mg/dL Final         Passed - Triglycerides in normal range and within 360 days    Triglycerides  Date Value Ref Range Status  01/17/2021 115 0 - 149 mg/dL Final         Passed - Patient is not pregnant      Passed - Valid encounter within last 12 months    Recent Outpatient Visits          2 months ago Essential hypertension   [facility] Juline Patch, MD   8 months ago Essential hypertension   [place], Deanna C, MD   1 year ago Essential hypertension   [facility] Juline Patch, MD   1 year ago Essential hypertension   [facility] Juline Patch, MD   2 years ago Essential hypertension   [place], Deanna C, MD      Future Appointments            In 3 months Juline Patch, MD [facility], [facility]           . losartan (COZAAR) 50 MG tablet [Pharmacy Med Name: LOSARTAN 50MG  TABLETS] 90 tablet 1    Sig: TAKE 1 TABLET BY MOUTH DAILY     Cardiovascular:  Angiotensin Receptor Blockers Failed - 04/10/2021  9:25 PM      Failed - Cr in normal range and within 180 days    Creatinine  Date Value Ref Range Status  12/31/2012 0.79 0.60 - 1.30 mg/dL Final   Creatinine, Ser  Date Value Ref Range Status  07/19/2020 0.70 0.57 - 1.00  mg/dL Final         Failed - K in normal range and within 180 days    Potassium  Date Value Ref Range Status  07/19/2020 4.1 3.5 - 5.2 mmol/L Final  12/31/2012 3.4 (L) 3.5 - 5.1 mmol/L Final         Failed - Last BP in normal range    BP Readings from Last 1 Encounters:  02/21/21 (!) 162/82         Passed - Patient is not pregnant      Passed - Valid encounter within last 6 months    Recent Outpatient Visits          2 months ago Essential hypertension   [place], Deanna C, MD   8 months ago Essential hypertension   [place], Deanna C, MD   1 year ago Essential hypertension   [place], Deanna C, MD   1 year ago Essential hypertension   [place], Deanna C, MD   2 years ago Essential hypertension   [facility] Juline Patch, MD      Future Appointments            In 3 months Juline Patch, MD [facility], [facility]

## 2021-05-09 ENCOUNTER — Telehealth: Payer: Self-pay

## 2021-05-09 NOTE — Telephone Encounter (Signed)
Copied from [place] [phone]. Topic: General - Other >> May 09, 2021  2:55 PM Ellen Baker A wrote: Reason for CRM: The patient would like to be contacted by a member of staff to discuss their vaccine history   Please contact further when available

## 2021-05-09 NOTE — Telephone Encounter (Signed)
Tried to call pt back. No answer. Left VM for her to call back.

## 2021-05-18 ENCOUNTER — Other Ambulatory Visit: Payer: Self-pay

## 2021-05-18 ENCOUNTER — Ambulatory Visit
Admission: RE | Admit: 2021-05-18 | Discharge: 2021-05-18 | Disposition: A | Discharge status: Home / Self Care | Payer: PPO | Source: Ambulatory Visit | Attending: Family Medicine | Admitting: Family Medicine

## 2021-05-18 DIAGNOSIS — Z1231 Encounter for screening mammogram for malignant neoplasm of breast: Secondary | ICD-10-CM | POA: Insufficient documentation

## 2021-06-05 ENCOUNTER — Ambulatory Visit (INDEPENDENT_AMBULATORY_CARE_PROVIDER_SITE_OTHER): Payer: PPO

## 2021-06-05 DIAGNOSIS — Z0000 Encounter for general adult medical examination without abnormal findings: Secondary | ICD-10-CM | POA: Diagnosis not present

## 2021-06-05 NOTE — Progress Notes (Signed)
Subjective:   [name] is a 70 y.o. female who presents for Medicare Annual (Subsequent) preventive examination.  Virtual Visit via Telephone Note  I connected with  [name] on 06/05/21 at 11:20 AM EST by telephone and verified that I am speaking with the correct person using two identifiers.  Location: Patient: home Provider: [facility] Persons participating in the virtual visit: [place]   I discussed the limitations, risks, security and privacy concerns of performing an evaluation and management service by telephone and the availability of in person appointments. The patient expressed understanding and agreed to proceed.  Interactive audio and video telecommunications were attempted between this nurse and patient, however failed, due to patient having technical difficulties OR patient did not have access to video capability.  We continued and completed visit with audio only.  Some vital signs may be absent or patient reported.   Clemetine Marker, LPN   Review of Systems     Cardiac Risk Factors include: advanced age (>106men, >56 women);dyslipidemia;hypertension;obesity (BMI >30kg/m2)     Objective:    There were no vitals filed for this visit. There is no height or weight on file to calculate BMI.  Advanced Directives 06/05/2021 02/07/2021 06/01/2020 04/05/2020 12/01/2019 12/01/2019 11/25/2019  Does Patient Have a Medical Advance Directive? No No No No No No No  Type of Advance Directive - - - - - - -  Does patient want to make changes to medical advance directive? - - - - - - -  Would patient like information on creating a medical advance directive? Yes (MAU/Ambulatory/Procedural Areas - Information given) No - Patient declined No - Patient declined No - Patient declined No - Patient declined No - Patient declined No - Patient declined  Pre-existing out of facility DNR order (yellow form or pink MOST form) - - - - - - -    Current Medications  (verified) Outpatient Encounter Medications as of 06/05/2021  Medication Sig   alendronate (FOSAMAX) 70 MG tablet TAKE 1 TABLET(70 MG) BY MOUTH 1 TIME A WEEK WITH A FULL GLASS OF WATER AND ON AN EMPTY STOMACH   Calcium Carbonate-Vitamin D3 600-400 MG-UNIT TABS Take 1 tablet by mouth daily.   clopidogrel (PLAVIX) 75 MG tablet TAKE 1 TABLET(75 MG) BY MOUTH DAILY   famotidine (PEPCID) 20 MG tablet TAKE 1 TABLET(20 MG) BY MOUTH TWICE DAILY   ibuprofen (ADVIL) 200 MG tablet Take by mouth.   Iron-Vitamin C 65-125 MG TABS Take 1 tablet by mouth 2 (two) times daily.   loratadine (CLARITIN) 10 MG tablet Take 1 tablet (10 mg total) by mouth continuous as needed for allergies.   losartan (COZAAR) 50 MG tablet TAKE 1 TABLET BY MOUTH DAILY   meclizine (ANTIVERT) 25 MG tablet Take 1 tablet (25 mg total) by mouth 3 (three) times daily as needed for dizziness.   Multiple Vitamins-Minerals (CENTRUM SILVER ULTRA WOMENS) TABS Take 1 tablet by mouth daily.   simvastatin (ZOCOR) 20 MG tablet TAKE 1 TABLET(20 MG) BY MOUTH DAILY   vitamin B-12 (CYANOCOBALAMIN) 1000 MCG tablet Take 1,000 mcg by mouth once a week.   No facility-administered encounter medications on file as of 06/05/2021.    Allergies (verified) Adhesive [tape]   History: Past Medical History:  Diagnosis Date   Arthritis    right knee   Carotid artery occlusion    Dizziness    GERD (gastroesophageal reflux disease)    Heart murmur    History of colon polyps  Hyperlipidemia    Hypertension    Iron deficiency    Stroke [facility]) 12-31-12   no residual effects   Past Surgical History:  Procedure Laterality Date   CAROTID ENDARTERECTOMY     RIGHT  01/19/13   COLONOSCOPY  08-18-12   COLONOSCOPY WITH PROPOFOL N/A 03/10/2018   Procedure: COLONOSCOPY WITH PROPOFOL;  Surgeon: Lollie Sails, MD;  Location: [facility] ENDOSCOPY;  Service: Endoscopy;  Laterality: N/A;   DILATION AND CURETTAGE OF UTERUS     ENDARTERECTOMY Right 01/19/2013    Procedure: ENDARTERECTOMY CAROTID with patch angioplasty;  Surgeon: Conrad [place], MD;  Location: [place];  Service: Vascular;  Laterality: Right;   ESOPHAGEAL DILATION     JOINT REPLACEMENT Left 04/15/2017   taotal knee arthorplasty Right 2021   TOTAL KNEE ARTHROPLASTY Right 12/01/2019   Procedure: TOTAL KNEE ARTHROPLASTY - RNFA;  Surgeon: Corky Mull, MD;  Location: [facility] ORS;  Service: Orthopedics;  Laterality: Right;   TUBAL LIGATION     UPPER GI ENDOSCOPY  08-18-12   Family History  Problem Relation Age of Onset   Stroke Mother    Deep vein thrombosis Mother    Heart disease Mother        Amputation-   Hypertension Mother    Alzheimer's disease Mother    Heart failure Father    Heart disease Father        CHF   Hypertension Father    Varicose Veins Father    Parkinson's disease Father    Breast cancer Paternal Aunt 47   Social History   Socioeconomic History   Marital status: Widowed    Spouse name: Not on file   Number of children: 3   Years of education: Not on file   Highest education level: 12th grade  Occupational History    Employer: [employer]  Tobacco Use   Smoking status: Never   Smokeless tobacco: Never   Tobacco comments:    Smoking cessation materials not required  Vaping Use   Vaping Use: Never used  Substance and Sexual Activity   Alcohol use: No    Alcohol/week: 0.0 standard drinks   Drug use: No   Sexual activity: Never  Other Topics Concern   Not on file  Social History Narrative   Pt lives alone.    Social Determinants of Health   Financial Resource Strain: Low Risk    Difficulty of Paying Living Expenses: Not hard at all  Food Insecurity: No Food Insecurity   Worried About Charity fundraiser in the Last Year: Never true   [place] in the Last Year: Never true  Transportation Needs: No Transportation Needs   Lack of Transportation (Medical): No   Lack of Transportation (Non-Medical): No  Physical Activity: Inactive   Days of  Exercise per Week: 0 days   Minutes of Exercise per Session: 0 min  Stress: No Stress Concern Present   Feeling of Stress : Only a little  Social Connections: Moderately Isolated   Frequency of Communication with Friends and Family: More than three times a week   Frequency of Social Gatherings with Friends and Family: Three times a week   Attends Religious Services: More than 4 times per year   Active Member of Clubs or Organizations: No   Attends Archivist Meetings: Never   Marital Status: Widowed    Tobacco Counseling Counseling given: Not Answered Tobacco comments: Smoking cessation materials not required   Clinical Intake:  Pre-visit  preparation completed: Yes  Pain : No/denies pain     Nutritional Status: BMI > 30  Obese Nutritional Risks: None Diabetes: No  How often do you need to have someone help you when you read instructions, pamphlets, or other written materials from your doctor or pharmacy?: 1 - Never    Interpreter Needed?: No  Information entered by :: Clemetine Marker LPN   Activities of Daily Living In your present state of health, do you have any difficulty performing the following activities: 06/05/2021  Hearing? N  Vision? N  Difficulty concentrating or making decisions? N  Walking or climbing stairs? N  Dressing or bathing? N  Doing errands, shopping? N  Preparing Food and eating ? N  Using the Toilet? N  In the past six months, have you accidently leaked urine? Y  Do you have problems with loss of bowel control? N  Managing your Medications? N  Managing your Finances? N  Housekeeping or managing your Housekeeping? N  Some recent data might be hidden    Patient Care Team: Juline Patch, MD as PCP - General (Family Medicine) Reche Dixon, PA-C (Orthopedic Surgery) Juline Patch, MD (Family Medicine)  Indicate any recent Medical Services you may have received from other than [facility] providers in the past year (date may be  approximate).     Assessment:   This is a routine wellness examination for Ellen Baker.  Hearing/Vision screen Hearing Screening - Comments:: Pt denies hearing difficulty Vision Screening - Comments:: Annual vision screenings done at [facility] Dr. Edison Pace  Dietary issues and exercise activities discussed: Current Exercise Habits: The patient does not participate in regular exercise at present, Exercise limited by: None identified   Goals Addressed             This Visit's Progress    DIET - INCREASE WATER INTAKE   Not on track    Recommend to drink at least 6-8 8oz glasses of water per day.       Depression Screen PHQ 2/9 Scores 06/05/2021 01/17/2021 07/19/2020 06/01/2020 01/01/2020 06/16/2019 06/06/2018  PHQ - 2 Score 0 0 0 0 0 0 0  PHQ- 9 Score - 0 0 - 4 0 0    Fall Risk Fall Risk  06/05/2021 01/17/2021 06/01/2020 01/01/2020 06/16/2019  Falls in the past year? 1 1 1  0 0  Comment - - - - -  Number falls in past yr: 1 0 0 - -  Injury with Fall? 0 0 0 - -  Risk for fall due to : History of fall(s) No Fall Risks No Fall Risks - -  Risk for fall due to: Comment - - - - -  Follow up Falls prevention discussed Falls evaluation completed Falls prevention discussed Falls evaluation completed Falls evaluation completed    [place]:  Any stairs in or around the home? Yes  If so, are there any without handrails? No  Home free of loose throw rugs in walkways, pet beds, electrical cords, etc? Yes  Adequate lighting in your home to reduce risk of falls? Yes   ASSISTIVE DEVICES UTILIZED TO PREVENT FALLS:  Life alert? No  Use of a cane, walker or w/c? Yes  Grab bars in the bathroom? No  Shower chair or bench in shower? Yes  Elevated toilet seat or a handicapped toilet? Yes   TIMED UP AND GO:  Was the test performed? No . Telephonic visit.   Cognitive Function: Normal cognitive  status assessed by direct observation by this Nurse Health  Advisor. No abnormalities found.       6CIT Screen 12/09/2017  What Year? 0 points  What month? 0 points  What time? 0 points  Count back from 20 0 points  Months in reverse 0 points  Repeat phrase 0 points  Total Score 0    Immunizations Immunization History  Administered Date(s) Administered   Fluad Quad(high Dose 65+) 05/05/2019, 04/11/2020   Influenza, High Dose Seasonal PF 03/31/2018, 06/02/2021   PFIZER(Purple Top)SARS-COV-2 Vaccination 08/11/2019, 09/01/2019, 04/27/2020   Pneumococcal Conjugate-13 06/05/2016   Pneumococcal Polysaccharide-23 12/05/2017   Tdap 07/19/2020    TDAP status: Up to date  Flu Vaccine status: Up to date  Pneumococcal vaccine status: Up to date  Covid-19 vaccine status: Completed vaccines  Qualifies for Shingles Vaccine? Yes   Zostavax completed No   Shingrix Completed?: No.    Education has been provided regarding the importance of this vaccine. Patient has been advised to call insurance company to determine out of pocket expense if they have not yet received this vaccine. Advised may also receive vaccine at local pharmacy or Health Dept. Verbalized acceptance and understanding.  Screening Tests Health Maintenance  Topic Date Due   Zoster Vaccines- Shingrix (1 of 2) Never done   COVID-19 Vaccine (4 - Booster for Pfizer series) 06/22/2020   Hepatitis C Screening  07/19/2021 (Originally 02/24/1969)   COLONOSCOPY (Pts 45-9yrs Insurance coverage will need to be confirmed)  03/11/2023   MAMMOGRAM  05/19/2023   TETANUS/TDAP  07/19/2030   Pneumonia Vaccine 73+ Years old  Completed   INFLUENZA VACCINE  Completed   DEXA SCAN  Completed   HPV VACCINES  Aged Out    Health Maintenance  Health Maintenance Due  Topic Date Due   Zoster Vaccines- Shingrix (1 of 2) Never done   COVID-19 Vaccine (4 - Booster for Pfizer series) 06/22/2020    Colorectal cancer screening: Type of screening: Colonoscopy. Completed 03/10/18. Repeat every 5  years  Mammogram status: Completed 05/18/21. Repeat every year  Bone Density status: Completed 06/20/20. Results reflect: Bone density results: OSTEOPENIA. Repeat every 2 years.  Lung Cancer Screening: (Low Dose CT Chest recommended if Age 24-80 years, 30 pack-year currently smoking OR have quit w/in 15years.) does not qualify.   Additional Screening:  Hepatitis C Screening: does qualify; postponed  Vision Screening: Recommended annual ophthalmology exams for early detection of glaucoma and other disorders of the eye. Is the patient up to date with their annual eye exam?  Yes  Who is the provider or what is the name of the office in which the patient attends annual eye exams? Dr. Edison Pace.   Dental Screening: Recommended annual dental exams for proper oral hygiene  Community Resource Referral / Chronic Care Management: CRR required this visit?  No   CCM required this visit?  No      Plan:     I have personally reviewed and noted the following in the patients chart:   Medical and social history Use of alcohol, tobacco or illicit drugs  Current medications and supplements including opioid prescriptions.  Functional ability and status Nutritional status Physical activity Advanced directives List of other physicians Hospitalizations, surgeries, and ER visits in previous 12 months Vitals Screenings to include cognitive, depression, and falls Referrals and appointments  In addition, I have reviewed and discussed with patient certain preventive protocols, quality metrics, and best practice recommendations. A written personalized care plan for preventive services as well  as general preventive health recommendations were provided to patient.     Clemetine Marker, LPN   03/50/0938   Nurse Notes: none

## 2021-06-05 NOTE — Patient Instructions (Signed)
Ellen Baker , Thank you for taking time to come for your Medicare Wellness Visit. I appreciate your ongoing commitment to your health goals. Please review the following plan we discussed and let me know if I can assist you in the future.   Screening recommendations/referrals: Colonoscopy: done 03/10/18. Repeat 02/2023 Mammogram: done 05/18/21 Bone Density: done 06/20/20 Recommended yearly ophthalmology/optometry visit for glaucoma screening and checkup Recommended yearly dental visit for hygiene and checkup  Vaccinations: Influenza vaccine: done 06/02/21 Pneumococcal vaccine: done 12/05/17 Tdap vaccine: done 07/19/20 Shingles vaccine: Shingrix discussed. Please contact your pharmacy for coverage information.  Covid-19:done 08/11/19, 09/01/19 & 04/27/20  Advanced directives: Advance directive discussed with you today. I have provided a copy for you to complete at home and have notarized. Once this is complete please bring a copy in to our office so we can scan it into your chart.   Conditions/risks identified: Recommend drinking 6-8 glasses of water per day   Next appointment: Follow up in one year for your annual wellness visit    Preventive Care 65 Years and Older, Female Preventive care refers to lifestyle choices and visits with your health care provider that can promote health and wellness. What does preventive care include? A yearly physical exam. This is also called an annual well check. Dental exams once or twice a year. Routine eye exams. Ask your health care provider how often you should have your eyes checked. Personal lifestyle choices, including: Daily care of your teeth and gums. Regular physical activity. Eating a healthy diet. Avoiding tobacco and drug use. Limiting alcohol use. Practicing safe sex. Taking low-dose aspirin every day. Taking vitamin and mineral supplements as recommended by your health care provider. What happens during an annual well check? The services  and screenings done by your health care provider during your annual well check will depend on your age, overall health, lifestyle risk factors, and family history of disease. Counseling  Your health care provider may ask you questions about your: Alcohol use. Tobacco use. Drug use. Emotional well-being. Home and relationship well-being. Sexual activity. Eating habits. History of falls. Memory and ability to understand (cognition). Work and work Statistician. Reproductive health. Screening  You may have the following tests or measurements: Height, weight, and BMI. Blood pressure. Lipid and cholesterol levels. These may be checked every 5 years, or more frequently if you are over 62 years old. Skin check. Lung cancer screening. You may have this screening every year starting at age 18 if you have a 30-pack-year history of smoking and currently smoke or have quit within the past 15 years. Fecal occult blood test (FOBT) of the stool. You may have this test every year starting at age 7. Flexible sigmoidoscopy or colonoscopy. You may have a sigmoidoscopy every 5 years or a colonoscopy every 10 years starting at age 59. Hepatitis C blood test. Hepatitis B blood test. Sexually transmitted disease (STD) testing. Diabetes screening. This is done by checking your blood sugar (glucose) after you have not eaten for a while (fasting). You may have this done every 1-3 years. Bone density scan. This is done to screen for osteoporosis. You may have this done starting at age 83. Mammogram. This may be done every 1-2 years. Talk to your health care provider about how often you should have regular mammograms. Talk with your health care provider about your test results, treatment options, and if necessary, the need for more tests. Vaccines  Your health care provider may recommend certain vaccines, such as: Influenza  vaccine. This is recommended every year. Tetanus, diphtheria, and acellular pertussis  (Tdap, Td) vaccine. You may need a Td booster every 10 years. Zoster vaccine. You may need this after age 82. Pneumococcal 13-valent conjugate (PCV13) vaccine. One dose is recommended after age 56. Pneumococcal polysaccharide (PPSV23) vaccine. One dose is recommended after age 44. Talk to your health care provider about which screenings and vaccines you need and how often you need them. This information is not intended to replace advice given to you by your health care provider. Make sure you discuss any questions you have with your health care provider. Document Released: 07/01/2015 Document Revised: 02/22/2016 Document Reviewed: 04/05/2015 Elsevier Interactive Patient Education  2017 [place] Prevention in the Home Falls can cause injuries. They can happen to people of all ages. There are many things you can do to make your home safe and to help prevent falls. What can I do on the outside of my home? Regularly fix the edges of walkways and driveways and fix any cracks. Remove anything that might make you trip as you walk through a door, such as a raised step or threshold. Trim any bushes or trees on the path to your home. Use bright outdoor lighting. Clear any walking paths of anything that might make someone trip, such as rocks or tools. Regularly check to see if handrails are loose or broken. Make sure that both sides of any steps have handrails. Any raised decks and porches should have guardrails on the edges. Have any leaves, snow, or ice cleared regularly. Use sand or salt on walking paths during winter. Clean up any spills in your garage right away. This includes oil or grease spills. What can I do in the bathroom? Use night lights. Install grab bars by the toilet and in the tub and shower. Do not use towel bars as grab bars. Use non-skid mats or decals in the tub or shower. If you need to sit down in the shower, use a plastic, non-slip stool. Keep the floor dry. Clean  up any water that spills on the floor as soon as it happens. Remove soap buildup in the tub or shower regularly. Attach bath mats securely with double-sided non-slip rug tape. Do not have throw rugs and other things on the floor that can make you trip. What can I do in the bedroom? Use night lights. Make sure that you have a light by your bed that is easy to reach. Do not use any sheets or blankets that are too big for your bed. They should not hang down onto the floor. Have a firm chair that has side arms. You can use this for support while you get dressed. Do not have throw rugs and other things on the floor that can make you trip. What can I do in the kitchen? Clean up any spills right away. Avoid walking on wet floors. Keep items that you use a lot in easy-to-reach places. If you need to reach something above you, use a strong step stool that has a grab bar. Keep electrical cords out of the way. Do not use floor polish or wax that makes floors slippery. If you must use wax, use non-skid floor wax. Do not have throw rugs and other things on the floor that can make you trip. What can I do with my stairs? Do not leave any items on the stairs. Make sure that there are handrails on both sides of the stairs and use them. Fix  handrails that are broken or loose. Make sure that handrails are as long as the stairways. Check any carpeting to make sure that it is firmly attached to the stairs. Fix any carpet that is loose or worn. Avoid having throw rugs at the top or bottom of the stairs. If you do have throw rugs, attach them to the floor with carpet tape. Make sure that you have a light switch at the top of the stairs and the bottom of the stairs. If you do not have them, ask someone to add them for you. What else can I do to help prevent falls? Wear shoes that: Do not have high heels. Have rubber bottoms. Are comfortable and fit you well. Are closed at the toe. Do not wear sandals. If you  use a stepladder: Make sure that it is fully opened. Do not climb a closed stepladder. Make sure that both sides of the stepladder are locked into place. Ask someone to hold it for you, if possible. Clearly mark and make sure that you can see: Any grab bars or handrails. First and last steps. Where the edge of each step is. Use tools that help you move around (mobility aids) if they are needed. These include: Canes. Walkers. Scooters. Crutches. Turn on the lights when you go into a dark area. Replace any light bulbs as soon as they burn out. Set up your furniture so you have a clear path. Avoid moving your furniture around. If any of your floors are uneven, fix them. If there are any pets around you, be aware of where they are. Review your medicines with your doctor. Some medicines can make you feel dizzy. This can increase your chance of falling. Ask your doctor what other things that you can do to help prevent falls. This information is not intended to replace advice given to you by your health care provider. Make sure you discuss any questions you have with your health care provider. Document Released: 03/31/2009 Document Revised: 11/10/2015 Document Reviewed: 07/09/2014 Elsevier Interactive Patient Education  2017 [employer].

## 2021-07-07 ENCOUNTER — Other Ambulatory Visit: Payer: Self-pay | Admitting: Family Medicine

## 2021-07-07 DIAGNOSIS — M816 Localized osteoporosis [Lequesne]: Secondary | ICD-10-CM

## 2021-07-07 NOTE — Telephone Encounter (Signed)
Requested medication (s) are due for refill today: yes  Requested medication (s) are on the active medication list: yes  Last refill:  04/15/21  Future visit scheduled: 07/21/21  Notes to clinic:  Failed protocol of labs within 360 days, (no Vit D on chart)  has upcoming appt, please assess.   Requested Prescriptions  Pending Prescriptions Disp Refills   alendronate (FOSAMAX) 70 MG tablet [Pharmacy Med Name: ALENDRONATE 70MG  TABLETS] 12 tablet 1    Sig: TAKE 1 TABLET(70 MG) BY MOUTH 1 TIME A WEEK WITH A FULL GLASS OF WATER AND ON AN EMPTY STOMACH     Endocrinology:  Bisphosphonates Failed - 07/07/2021  3:34 AM      Failed - Vitamin D in normal range and within 360 days    No results found for: [id number], [id number], [id number], 25OHVITD3, 25OHVITD2, 25OHVITD3, 25OHVITD2, 25OHVITD1, 25OHVITD2, 25OHVITD3, VD25OH        Passed - Ca in normal range and within 360 days    Calcium  Date Value Ref Range Status  07/19/2020 9.5 8.7 - 10.3 mg/dL Final   Calcium, Total  Date Value Ref Range Status  12/31/2012 9.1 8.5 - 10.1 mg/dL Final          Passed - Valid encounter within last 12 months    Recent Outpatient Visits           5 months ago Essential hypertension   [place], MD   11 months ago Essential hypertension   [place], Deanna C, MD   1 year ago Essential hypertension   [place], Deanna C, MD   2 years ago Essential hypertension   [place], Deanna C, MD   2 years ago Essential hypertension   [place], Deanna C, MD       Future Appointments             In 2 weeks Juline Patch, MD [place]

## 2021-07-13 ENCOUNTER — Other Ambulatory Visit: Payer: Self-pay | Admitting: Family Medicine

## 2021-07-13 DIAGNOSIS — I10 Essential (primary) hypertension: Secondary | ICD-10-CM

## 2021-07-21 ENCOUNTER — Encounter: Payer: Self-pay | Admitting: Family Medicine

## 2021-07-21 ENCOUNTER — Ambulatory Visit (INDEPENDENT_AMBULATORY_CARE_PROVIDER_SITE_OTHER): Payer: PPO | Admitting: Family Medicine

## 2021-07-21 ENCOUNTER — Other Ambulatory Visit: Payer: Self-pay

## 2021-07-21 VITALS — BP 158/98 | HR 90 | Ht 64.0 in | Wt 213.0 lb

## 2021-07-21 DIAGNOSIS — K219 Gastro-esophageal reflux disease without esophagitis: Secondary | ICD-10-CM | POA: Diagnosis not present

## 2021-07-21 DIAGNOSIS — I10 Essential (primary) hypertension: Secondary | ICD-10-CM

## 2021-07-21 DIAGNOSIS — M816 Localized osteoporosis [Lequesne]: Secondary | ICD-10-CM | POA: Diagnosis not present

## 2021-07-21 DIAGNOSIS — I679 Cerebrovascular disease, unspecified: Secondary | ICD-10-CM | POA: Diagnosis not present

## 2021-07-21 DIAGNOSIS — E785 Hyperlipidemia, unspecified: Secondary | ICD-10-CM | POA: Diagnosis not present

## 2021-07-21 MED ORDER — LOSARTAN POTASSIUM 50 MG PO TABS: 50.0000 mg | ORAL_TABLET | Freq: Every day | ORAL | 1 refills | Status: DC

## 2021-07-21 MED ORDER — ALENDRONATE SODIUM 70 MG PO TABS: ORAL_TABLET | ORAL | 1 refills | Status: DC

## 2021-07-21 MED ORDER — CLOPIDOGREL BISULFATE 75 MG PO TABS: ORAL_TABLET | ORAL | 1 refills | Status: DC

## 2021-07-21 MED ORDER — SIMVASTATIN 20 MG PO TABS: ORAL_TABLET | ORAL | 1 refills | Status: DC

## 2021-07-21 MED ORDER — FAMOTIDINE 20 MG PO TABS: ORAL_TABLET | ORAL | 1 refills | Status: DC

## 2021-07-21 NOTE — Progress Notes (Signed)
Date:  07/21/2021   Name:  Ellen Baker   DOB:  1950-09-10   MRN:  [id number]   Chief Complaint: No chief complaint on file.  Hypertension This is a chronic problem. The current episode started more than 1 year ago. The problem has been gradually improving since onset. The problem is controlled. Pertinent negatives include no anxiety, blurred vision, chest pain, headaches, malaise/fatigue, neck pain, orthopnea, palpitations, peripheral edema, PND, shortness of breath or sweats. There are no associated agents to hypertension. There are no known risk factors for coronary artery disease. Past treatments include angiotensin blockers. The current treatment provides moderate improvement. There are no compliance problems.  There is no history of angina, kidney disease, CAD/MI, CVA, heart failure, left ventricular hypertrophy, PVD or retinopathy. There is no history of chronic renal disease, a hypertension causing med or renovascular disease.  Hyperlipidemia This is a chronic problem. The current episode started more than 1 year ago. The problem is controlled. Recent lipid tests were reviewed and are normal. She has no history of chronic renal disease, diabetes, hypothyroidism, liver disease, obesity or nephrotic syndrome. Factors aggravating her hyperlipidemia include thiazides. Pertinent negatives include no chest pain, focal sensory loss, focal weakness, leg pain, myalgias or shortness of breath. Current antihyperlipidemic treatment includes statins. Risk factors for coronary artery disease include dyslipidemia.  Gastroesophageal Reflux She reports no abdominal pain, no belching, no chest pain, no choking, no coughing, no dysphagia, no heartburn, no hoarse voice, no nausea, no sore throat or no wheezing. This is a chronic problem. The current episode started more than 1 year ago. The problem has been gradually improving. Nothing aggravates the symptoms. Pertinent negatives include no anemia, fatigue,  melena, muscle weakness, orthopnea or weight loss. She has tried a PPI for the symptoms.   Lab Results  Component Value Date   NA 140 07/19/2020   K 4.1 07/19/2020   CO2 29 07/19/2020   GLUCOSE 89 07/19/2020   BUN 9 07/19/2020   CREATININE 0.70 07/19/2020   CALCIUM 9.5 07/19/2020   GFRNONAA 89 07/19/2020   Lab Results  Component Value Date   CHOL 171 01/17/2021   HDL 59 01/17/2021   LDLCALC 92 01/17/2021   TRIG 115 01/17/2021   CHOLHDL 2.6 12/05/2017   Lab Results  Component Value Date   TSH 1.757 07/21/2018   Lab Results  Component Value Date   HGBA1C 5.3 01/01/2013   Lab Results  Component Value Date   WBC 4.6 02/03/2021   HGB 12.4 02/03/2021   HCT 36.4 02/03/2021   MCV 89.9 02/03/2021   PLT 254 02/03/2021   Lab Results  Component Value Date   ALT 10 07/19/2020   AST 13 07/19/2020   ALKPHOS 92 07/19/2020   BILITOT 0.4 07/19/2020   No results found for: 25OHVITD2, 25OHVITD3, VD25OH   Review of Systems  Constitutional:  Negative for chills, fatigue, fever, malaise/fatigue and weight loss.  HENT:  Negative for drooling, ear discharge, ear pain, hoarse voice and sore throat.   Eyes:  Negative for blurred vision.  Respiratory:  Negative for cough, choking, shortness of breath and wheezing.   Cardiovascular:  Negative for chest pain, palpitations, orthopnea, leg swelling and PND.  Gastrointestinal:  Negative for abdominal pain, blood in stool, constipation, diarrhea, dysphagia, heartburn, melena and nausea.  Endocrine: Negative for polydipsia.  Genitourinary:  Negative for dysuria, frequency, hematuria and urgency.  Musculoskeletal:  Negative for back pain, myalgias, muscle weakness and neck pain.  Skin:  Negative  for rash.  Allergic/Immunologic: Negative for environmental allergies.  Neurological:  Negative for dizziness, focal weakness and headaches.  Hematological:  Does not bruise/bleed easily.  Psychiatric/Behavioral:  Negative for suicidal ideas. The  patient is not nervous/anxious.    Patient Active Problem List   Diagnosis Date Noted   Iron deficiency 02/07/2021   Status post total knee replacement using cement, right 12/01/2019   B12 deficiency 09/24/2018   Normocytic anemia 07/20/2018   Primary osteoarthritis of right knee 02/04/2018   Status post total knee replacement using cement, left 02/04/2018   Obesity (BMI 35.0-39.9 without comorbidity) 01/18/2017   Primary osteoarthritis of left knee 01/18/2017   Cerebral vascular insufficiency 06/06/2015   Dyslipidemia 06/06/2015   Aftercare following surgery of the circulatory system, NEC 08/03/2013   Occlusion and stenosis of carotid artery without mention of cerebral infarction 01/16/2013   Pre-operative cardiovascular examination 01/16/2013   Carotid artery stenosis, symptomatic 01/02/2013   Occlusion and stenosis of unspecified carotid artery 01/02/2013   CVA (cerebral infarction) 12/31/2012   HTN (hypertension) 12/31/2012   GERD (gastroesophageal reflux disease) 12/31/2012   Cerebral artery occlusion with cerebral infarction ([place]) 12/31/2012   Cerebral infarction ([place]) 12/31/2012   Esophageal reflux 12/31/2012   Essential hypertension 12/31/2012    Allergies  Allergen Reactions   Adhesive [Tape] Rash    Skin became RAW    Past Surgical History:  Procedure Laterality Date   CAROTID ENDARTERECTOMY     RIGHT  01/19/13   COLONOSCOPY  08-18-12   COLONOSCOPY WITH PROPOFOL N/A 03/10/2018   Procedure: COLONOSCOPY WITH PROPOFOL;  Surgeon: Lollie Sails, MD;  Location: [facility] ENDOSCOPY;  Service: Endoscopy;  Laterality: N/A;   DILATION AND CURETTAGE OF UTERUS     ENDARTERECTOMY Right 01/19/2013   Procedure: ENDARTERECTOMY CAROTID with patch angioplasty;  Surgeon: Conrad Wautoma, MD;  Location: [place];  Service: Vascular;  Laterality: Right;   ESOPHAGEAL DILATION     JOINT REPLACEMENT Left 04/15/2017   taotal knee arthorplasty Right 2021   TOTAL KNEE ARTHROPLASTY Right 12/01/2019    Procedure: TOTAL KNEE ARTHROPLASTY - RNFA;  Surgeon: Corky Mull, MD;  Location: [facility]S;  Service: Orthopedics;  Laterality: Right;   TUBAL LIGATION     UPPER GI ENDOSCOPY  08-18-12    Social History   Tobacco Use   Smoking status: Never   Smokeless tobacco: Never   Tobacco comments:    Smoking cessation materials not required  Vaping Use   Vaping Use: Never used  Substance Use Topics   Alcohol use: No    Alcohol/week: 0.0 standard drinks   Drug use: No     Medication list has been reviewed and updated.  Current Meds  Medication Sig   alendronate (FOSAMAX) 70 MG tablet TAKE 1 TABLET(70 MG) BY MOUTH 1 TIME A WEEK WITH A FULL GLASS OF WATER AND ON AN EMPTY STOMACH   Calcium Carbonate-Vitamin D3 600-400 MG-UNIT TABS Take 1 tablet by mouth daily.   clopidogrel (PLAVIX) 75 MG tablet TAKE 1 TABLET(75 MG) BY MOUTH DAILY   famotidine (PEPCID) 20 MG tablet TAKE 1 TABLET(20 MG) BY MOUTH TWICE DAILY   ibuprofen (ADVIL) 200 MG tablet Take by mouth.   Iron-Vitamin C 65-125 MG TABS Take 1 tablet by mouth 2 (two) times daily.   loratadine (CLARITIN) 10 MG tablet Take 1 tablet (10 mg total) by mouth continuous as needed for allergies.   losartan (COZAAR) 50 MG tablet TAKE 1 TABLET BY MOUTH DAILY   meclizine (ANTIVERT)  25 MG tablet Take 1 tablet (25 mg total) by mouth 3 (three) times daily as needed for dizziness.   Multiple Vitamins-Minerals (CENTRUM SILVER ULTRA WOMENS) TABS Take 1 tablet by mouth daily.   simvastatin (ZOCOR) 20 MG tablet TAKE 1 TABLET(20 MG) BY MOUTH DAILY   vitamin B-12 (CYANOCOBALAMIN) 1000 MCG tablet Take 1,000 mcg by mouth once a week.    PHQ 2/9 Scores 07/21/2021 06/05/2021 01/17/2021 07/19/2020  PHQ - 2 Score 0 0 0 0  PHQ- 9 Score 0 - 0 0    GAD 7 : Generalized Anxiety Score 07/21/2021 01/17/2021 01/01/2020 06/16/2019  Nervous, Anxious, on Edge 0 0 0 0  Control/stop worrying 1 0 0 0  Worry too much - different things 0 0 0 0  Trouble relaxing 0 0 0 0  Restless 0 0  0 0  Easily annoyed or irritable 0 0 0 0  Afraid - awful might happen 0 0 0 0  Total GAD 7 Score 1 0 0 0  Anxiety Difficulty Not difficult at all - - -    BP Readings from Last 3 Encounters:  07/21/21 (!) 158/98  02/21/21 (!) 162/82  02/07/21 (!) 153/99    Physical Exam Vitals and nursing note reviewed.  Constitutional:      Appearance: She is well-developed.  HENT:     Head: Normocephalic.     Right Ear: Tympanic membrane and external ear normal.     Left Ear: Tympanic membrane and external ear normal.  Eyes:     General: Lids are everted, no foreign bodies appreciated. No scleral icterus.       Left eye: No foreign body or hordeolum.     Conjunctiva/sclera: Conjunctivae normal.     Right eye: Right conjunctiva is not injected.     Left eye: Left conjunctiva is not injected.     Pupils: Pupils are equal, round, and reactive to light.  Neck:     Thyroid: No thyromegaly.     Vascular: No JVD.     Trachea: No tracheal deviation.  Cardiovascular:     Rate and Rhythm: Normal rate and regular rhythm.     Heart sounds: Normal heart sounds, S1 normal and S2 normal. No murmur heard. No systolic murmur is present.  No diastolic murmur is present.    No friction rub. No gallop. No S3 or S4 sounds.  Pulmonary:     Effort: Pulmonary effort is normal. No respiratory distress.     Breath sounds: Normal breath sounds. No wheezing or rales.  Abdominal:     General: Bowel sounds are normal.     Palpations: Abdomen is soft. There is no mass.     Tenderness: There is no abdominal tenderness. There is no guarding or rebound.  Musculoskeletal:        General: No tenderness. Normal range of motion.     Cervical back: Normal range of motion and neck supple.  Lymphadenopathy:     Cervical: No cervical adenopathy.  Skin:    General: Skin is warm.     Findings: No rash.  Neurological:     Mental Status: She is alert and oriented to person, place, and time.     Cranial Nerves: No cranial  nerve deficit.     Deep Tendon Reflexes: Reflexes normal.  Psychiatric:        Mood and Affect: Mood is not anxious or depressed.    Wt Readings from Last 3 Encounters:  07/21/21 213 lb (96.6 kg)  02/21/21 212 lb (96.2 kg)  02/07/21 211 lb 15.6 oz (96.2 kg)    BP (!) 158/98 (BP Location: Right Arm)    Pulse 90    Ht 5\' 4"  (1.626 m)    Wt 213 lb (96.6 kg)    BMI 36.56 kg/m   Assessment and Plan:  1. Essential hypertension Chronic.  Controlled.  Stable.  Blood pressure is elevated but that is because she did not take her medication and it is registering today 158/98 patient has been encouraged to take her medication prior to her appointment in 6 months.  Continue losartan 50 mg once a day. - losartan (COZAAR) 50 MG tablet; Take 1 tablet (50 mg total) by mouth daily.  Dispense: 90 tablet; Refill: 1  2. Dyslipidemia Chronic.  Controlled.  Stable.  Continue simvastatin 20 mg once a day. - simvastatin (ZOCOR) 20 MG tablet; TAKE 1 TABLET(20 MG) BY MOUTH DAILY  Dispense: 90 tablet; Refill: 1  3. Gastroesophageal reflux disease, unspecified whether esophagitis present Chronic.  Controlled.  Stable.  Continue Pepcid 20 mg twice a day. - famotidine (PEPCID) 20 MG tablet; TAKE 1 TABLET(20 MG) BY MOUTH TWICE DAILY  Dispense: 180 tablet; Refill: 1  4. Localized osteoporosis without current pathological fracture Chronic.  Controlled.  Stable.  Continue Fosamax 70 mg once a week. - alendronate (FOSAMAX) 70 MG tablet; TAKE 1 TABLET(70 MG) BY MOUTH 1 TIME A WEEK WITH A FULL GLASS OF WATER AND ON AN EMPTY STOMACH  Dispense: 12 tablet; Refill: 1  5. Cerebral vascular insufficiency .  Controlled.  Stable.  No recurrence of TIAs.  Last evaluated by vein and vascular notes carotids are doing well.  We will continue with control of atherosclerosis with hypertension control, lipid control, and continuance of Plavix 75 mg once a day - clopidogrel (PLAVIX) 75 MG tablet; Take 1 tab by mouth daily  Dispense:  90 tablet; Refill: 1

## 2021-07-24 ENCOUNTER — Other Ambulatory Visit: Payer: Self-pay | Admitting: *Deleted

## 2021-07-24 DIAGNOSIS — E611 Iron deficiency: Secondary | ICD-10-CM

## 2021-07-25 ENCOUNTER — Other Ambulatory Visit: Payer: Self-pay | Admitting: Family Medicine

## 2021-07-25 DIAGNOSIS — K219 Gastro-esophageal reflux disease without esophagitis: Secondary | ICD-10-CM

## 2021-08-01 ENCOUNTER — Other Ambulatory Visit: Payer: Self-pay | Admitting: Family Medicine

## 2021-08-01 DIAGNOSIS — K219 Gastro-esophageal reflux disease without esophagitis: Secondary | ICD-10-CM

## 2021-08-01 NOTE — Telephone Encounter (Signed)
Copied from [place] [id number]. Topic: Quick Communication - Rx Refill/Question >> Aug 01, 2021  9:11 AM Tessa Lerner A wrote: Medication: famotidine (PEPCID) 20 MG tablet [[id number]]   Has the patient contacted their pharmacy? Yes.  The patient's been directed to contact their PCP (Agent: If no, request that the patient contact the pharmacy for the refill. If patient does not wish to contact the pharmacy document the reason why and proceed with request.) (Agent: If yes, when and what did the pharmacy advise?)  Preferred Pharmacy (with phone number or street name): [facility] [place], [place] [address] AT [address] [place] [address] Phone: [phone] Fax: [phone]  Has the patient been seen for an appointment in the last year OR does the patient have an upcoming appointment? Yes.    Agent: Please be advised that RX refills may take up to 3 business days. We ask that you follow-up with your pharmacy.

## 2021-08-02 NOTE — Telephone Encounter (Signed)
Spoke with pharmacy. States refill received, have not filled "Checking with insurance." States they will take care of refill today.  Requested Prescriptions  Pending Prescriptions Disp Refills   famotidine (PEPCID) 20 MG tablet 180 tablet 1    Sig: TAKE 1 TABLET(20 MG) BY MOUTH TWICE DAILY     Gastroenterology:  H2 Antagonists Passed - 08/01/2021 11:28 AM      Passed - Valid encounter within last 12 months    Recent Outpatient Visits          1 week ago Essential hypertension   [place], MD   6 months ago Essential hypertension   [place], Deanna C, MD   1 year ago Essential hypertension   [place], Deanna C, MD   1 year ago Essential hypertension   [place], Deanna C, MD   2 years ago Essential hypertension   [place], Deanna C, MD      Future Appointments            In 5 months Juline Patch, MD [facility], [facility]

## 2021-08-02 NOTE — Telephone Encounter (Signed)
Attempted to reach pharmacy. Extended hold, will CB to verify. Receipt confirmed 07/21/21  at 0930.

## 2021-08-04 ENCOUNTER — Inpatient Hospital Stay: Payer: PPO | Attending: Internal Medicine

## 2021-08-04 ENCOUNTER — Other Ambulatory Visit: Payer: Self-pay

## 2021-08-04 DIAGNOSIS — I10 Essential (primary) hypertension: Secondary | ICD-10-CM | POA: Insufficient documentation

## 2021-08-04 DIAGNOSIS — Z803 Family history of malignant neoplasm of breast: Secondary | ICD-10-CM | POA: Insufficient documentation

## 2021-08-04 DIAGNOSIS — D509 Iron deficiency anemia, unspecified: Secondary | ICD-10-CM | POA: Insufficient documentation

## 2021-08-04 DIAGNOSIS — E611 Iron deficiency: Secondary | ICD-10-CM

## 2021-08-04 DIAGNOSIS — E785 Hyperlipidemia, unspecified: Secondary | ICD-10-CM | POA: Diagnosis not present

## 2021-08-04 DIAGNOSIS — R011 Cardiac murmur, unspecified: Secondary | ICD-10-CM | POA: Insufficient documentation

## 2021-08-04 DIAGNOSIS — Z86010 Personal history of colonic polyps: Secondary | ICD-10-CM | POA: Insufficient documentation

## 2021-08-04 DIAGNOSIS — K219 Gastro-esophageal reflux disease without esophagitis: Secondary | ICD-10-CM | POA: Insufficient documentation

## 2021-08-04 DIAGNOSIS — Z8673 Personal history of transient ischemic attack (TIA), and cerebral infarction without residual deficits: Secondary | ICD-10-CM | POA: Diagnosis not present

## 2021-08-04 DIAGNOSIS — E538 Deficiency of other specified B group vitamins: Secondary | ICD-10-CM | POA: Insufficient documentation

## 2021-08-04 DIAGNOSIS — Z79899 Other long term (current) drug therapy: Secondary | ICD-10-CM | POA: Insufficient documentation

## 2021-08-04 LAB — CBC WITH DIFFERENTIAL/PLATELET
Abs Immature Granulocytes: 0.01 10*3/uL (ref 0.00–0.07)
Basophils Absolute: 0 10*3/uL (ref 0.0–0.1)
Basophils Relative: 1 %
Eosinophils Absolute: 0.2 10*3/uL (ref 0.0–0.5)
Eosinophils Relative: 3 %
HCT: 39 % (ref 36.0–46.0)
Hemoglobin: 12.6 g/dL (ref 12.0–15.0)
Immature Granulocytes: 0 %
Lymphocytes Relative: 22 %
Lymphs Abs: 1.2 10*3/uL (ref 0.7–4.0)
MCH: 29.3 pg (ref 26.0–34.0)
MCHC: 32.3 g/dL (ref 30.0–36.0)
MCV: 90.7 fL (ref 80.0–100.0)
Monocytes Absolute: 0.4 10*3/uL (ref 0.1–1.0)
Monocytes Relative: 8 %
Neutro Abs: 3.6 10*3/uL (ref 1.7–7.7)
Neutrophils Relative %: 66 %
Platelets: 259 10*3/uL (ref 150–400)
RBC: 4.3 MIL/uL (ref 3.87–5.11)
RDW: 12.5 % (ref 11.5–15.5)
WBC: 5.4 10*3/uL (ref 4.0–10.5)
nRBC: 0 % (ref 0.0–0.2)

## 2021-08-04 LAB — FERRITIN: Ferritin: 16 ng/mL (ref 11–307)

## 2021-08-04 LAB — COMPREHENSIVE METABOLIC PANEL
ALT: 11 U/L (ref 0–44)
AST: 20 U/L (ref 15–41)
Albumin: 3.7 g/dL (ref 3.5–5.0)
Alkaline Phosphatase: 71 U/L (ref 38–126)
Anion gap: 8 (ref 5–15)
BUN: 9 mg/dL (ref 8–23)
CO2: 30 mmol/L (ref 22–32)
Calcium: 9 mg/dL (ref 8.9–10.3)
Chloride: 102 mmol/L (ref 98–111)
Creatinine, Ser: 0.7 mg/dL (ref 0.44–1.00)
GFR, Estimated: >60 mL/min (ref >60)
Glucose, Bld: 105 mg/dL — ABNORMAL HIGH (ref 70–99)
Potassium: 3.7 mmol/L (ref 3.5–5.1)
Sodium: 140 mmol/L (ref 135–145)
Total Bilirubin: 0.4 mg/dL (ref 0.3–1.2)
Total Protein: 7.4 g/dL (ref 6.5–8.1)

## 2021-08-04 LAB — IRON AND TIBC
Iron: 109 ug/dL (ref 28–170)
Saturation Ratios: 22 % (ref 10.4–31.8)
TIBC: 494 ug/dL — ABNORMAL HIGH (ref 250–450)
UIBC: 385 ug/dL

## 2021-08-04 LAB — VITAMIN B12: Vitamin B-12: 675 pg/mL (ref 180–914)

## 2021-08-08 ENCOUNTER — Inpatient Hospital Stay: Payer: PPO | Admitting: Internal Medicine

## 2021-08-08 ENCOUNTER — Encounter: Payer: Self-pay | Admitting: Internal Medicine

## 2021-08-08 ENCOUNTER — Other Ambulatory Visit: Payer: Self-pay

## 2021-08-08 DIAGNOSIS — E538 Deficiency of other specified B group vitamins: Secondary | ICD-10-CM | POA: Diagnosis not present

## 2021-08-08 DIAGNOSIS — E611 Iron deficiency: Secondary | ICD-10-CM

## 2021-08-08 NOTE — Progress Notes (Signed)
[facility] NOTE  Patient Care Team: Juline Patch, MD as PCP - General (Family Medicine) Reche Dixon, PA-C (Orthopedic Surgery) Juline Patch, MD (Family Medicine)  CHIEF COMPLAINTS/PURPOSE OF CONSULTATION:  Iron/B12 deficiency  #  Oncology History   No history exists.     HISTORY OF PRESENTING ILLNESS: Alone.  Ambulating independently.  [employer] Reier 71 y.o.  female history of iron deficiency has B12 deficiency is here for follow-up.  Patient has been on oral iron supplementation Vitron-C twice a day.  She is also on B12 supplementation once a week.  Patient denies any blood in stools black or stools daily nausea vomiting.  Has any tingling or numbness.  No falls.  Review of Systems  Constitutional:  Negative for chills, diaphoresis, fever, malaise/fatigue and weight loss.  HENT:  Negative for nosebleeds and sore throat.   Eyes:  Negative for double vision.  Respiratory:  Negative for cough, hemoptysis, sputum production, shortness of breath and wheezing.   Cardiovascular:  Negative for chest pain, palpitations, orthopnea and leg swelling.  Gastrointestinal:  Negative for abdominal pain, blood in stool, constipation, diarrhea, heartburn, melena, nausea and vomiting.  Genitourinary:  Negative for dysuria, frequency and urgency.  Musculoskeletal:  Negative for back pain and joint pain.  Skin: Negative.  Negative for itching and rash.  Neurological:  Negative for dizziness, tingling, focal weakness, weakness and headaches.  Endo/Heme/Allergies:  Does not bruise/bleed easily.  Psychiatric/Behavioral:  Negative for depression. The patient is not nervous/anxious and does not have insomnia.     MEDICAL HISTORY:  Past Medical History:  Diagnosis Date   Arthritis    right knee   Carotid artery occlusion    Dizziness    GERD (gastroesophageal reflux disease)    Heart murmur    History of colon polyps    Hyperlipidemia    Hypertension    Iron  deficiency    Stroke ([place]) 12-31-12   no residual effects    SURGICAL HISTORY: Past Surgical History:  Procedure Laterality Date   CAROTID ENDARTERECTOMY     RIGHT  01/19/13   COLONOSCOPY  08-18-12   COLONOSCOPY WITH PROPOFOL N/A 03/10/2018   Procedure: COLONOSCOPY WITH PROPOFOL;  Surgeon: Lollie Sails, MD;  Location: [facility] ENDOSCOPY;  Service: Endoscopy;  Laterality: N/A;   DILATION AND CURETTAGE OF UTERUS     ENDARTERECTOMY Right 01/19/2013   Procedure: ENDARTERECTOMY CAROTID with patch angioplasty;  Surgeon: Conrad Travilah, MD;  Location: [facility];  Service: Vascular;  Laterality: Right;   ESOPHAGEAL DILATION     JOINT REPLACEMENT Left 04/15/2017   taotal knee arthorplasty Right 2021   TOTAL KNEE ARTHROPLASTY Right 12/01/2019   Procedure: TOTAL KNEE ARTHROPLASTY - RNFA;  Surgeon: Corky Mull, MD;  Location: [facility];  Service: Orthopedics;  Laterality: Right;   TUBAL LIGATION     UPPER GI ENDOSCOPY  08-18-12    SOCIAL HISTORY: Social History   Socioeconomic History   Marital status: Widowed    Spouse name: Not on file   Number of children: 3   Years of education: Not on file   Highest education level: 12th grade  Occupational History    Employer: [employer]  Tobacco Use   Smoking status: Never   Smokeless tobacco: Never   Tobacco comments:    Smoking cessation materials not required  Vaping Use   Vaping Use: Never used  Substance and Sexual Activity   Alcohol use: No    Alcohol/week: 0.0 standard drinks  Drug use: No   Sexual activity: Never  Other Topics Concern   Not on file  Social History Narrative   Pt lives alone.    Social Determinants of Health   Financial Resource Strain: Low Risk    Difficulty of Paying Living Expenses: Not hard at all  Food Insecurity: No Food Insecurity   Worried About Charity fundraiser in the Last Year: Never true   [place] in the Last Year: Never true  Transportation Needs: No Transportation Needs   Lack of  Transportation (Medical): No   Lack of Transportation (Non-Medical): No  Physical Activity: Inactive   Days of Exercise per Week: 0 days   Minutes of Exercise per Session: 0 min  Stress: No Stress Concern Present   Feeling of Stress : Only a little  Social Connections: Moderately Isolated   Frequency of Communication with Friends and Family: More than three times a week   Frequency of Social Gatherings with Friends and Family: Three times a week   Attends Religious Services: More than 4 times per year   Active Member of Clubs or Organizations: No   Attends Archivist Meetings: Never   Marital Status: Widowed  Human resources officer Violence: Not At Risk   Fear of Current or Ex-Partner: No   Emotionally Abused: No   Physically Abused: No   Sexually Abused: No    FAMILY HISTORY: Family History  Problem Relation Age of Onset   Stroke Mother    Deep vein thrombosis Mother    Heart disease Mother        Amputation-   Hypertension Mother    Alzheimer's disease Mother    Heart failure Father    Heart disease Father        CHF   Hypertension Father    Varicose Veins Father    Parkinson's disease Father    Breast cancer Paternal Aunt 26    ALLERGIES:  is allergic to adhesive [tape].  MEDICATIONS:  Current Outpatient Medications  Medication Sig Dispense Refill   alendronate (FOSAMAX) 70 MG tablet TAKE 1 TABLET(70 MG) BY MOUTH 1 TIME A WEEK WITH A FULL GLASS OF WATER AND ON AN EMPTY STOMACH 12 tablet 1   Calcium Carbonate-Vitamin D3 600-400 MG-UNIT TABS Take 1 tablet by mouth daily.     clopidogrel (PLAVIX) 75 MG tablet Take 1 tab by mouth daily 90 tablet 1   famotidine (PEPCID) 20 MG tablet TAKE 1 TABLET(20 MG) BY MOUTH TWICE DAILY 180 tablet 1   ibuprofen (ADVIL) 200 MG tablet Take by mouth.     Iron-Vitamin C 65-125 MG TABS Take 1 tablet by mouth 2 (two) times daily. 60 tablet 1   loratadine (CLARITIN) 10 MG tablet Take 1 tablet (10 mg total) by mouth continuous as  needed for allergies. 30 tablet 6   losartan (COZAAR) 50 MG tablet Take 1 tablet (50 mg total) by mouth daily. 90 tablet 1   meclizine (ANTIVERT) 25 MG tablet Take 1 tablet (25 mg total) by mouth 3 (three) times daily as needed for dizziness. 30 tablet 0   Multiple Vitamins-Minerals (CENTRUM SILVER ULTRA WOMENS) TABS Take 1 tablet by mouth daily.     simvastatin (ZOCOR) 20 MG tablet TAKE 1 TABLET(20 MG) BY MOUTH DAILY 90 tablet 1   vitamin B-12 (CYANOCOBALAMIN) 1000 MCG tablet Take 1,000 mcg by mouth once a week.     No current facility-administered medications for this visit.      Marland Kitchen  PHYSICAL EXAMINATION: ECOG PERFORMANCE STATUS: 0 - Asymptomatic  Vitals:   08/08/21 1003  BP: (!) 154/71  Pulse: (!) 101  Temp: (!) 97.5 F (36.4 C)  SpO2: 99%   Filed Weights   08/08/21 1003  Weight: 217 lb (98.4 kg)    Physical Exam Vitals and nursing note reviewed.  Constitutional:      Comments:     HENT:     Head: Normocephalic and atraumatic.     Mouth/Throat:     Mouth: Mucous membranes are moist.     Pharynx: No oropharyngeal exudate.  Eyes:     Pupils: Pupils are equal, round, and reactive to light.  Cardiovascular:     Rate and Rhythm: Normal rate and regular rhythm.  Pulmonary:     Effort: No respiratory distress.     Breath sounds: No wheezing.     Comments: Decreased breath sounds bilaterally at bases.  No wheeze or crackles Abdominal:     General: Bowel sounds are normal. There is no distension.     Palpations: Abdomen is soft. There is no mass.     Tenderness: There is no abdominal tenderness. There is no guarding or rebound.  Musculoskeletal:        General: No tenderness. Normal range of motion.     Cervical back: Normal range of motion and neck supple.  Skin:    General: Skin is warm.  Neurological:     Mental Status: She is alert and oriented to person, place, and time.  Psychiatric:        Mood and Affect: Affect normal.        Judgment: Judgment normal.     LABORATORY DATA:  I have reviewed the data as listed Lab Results  Component Value Date   WBC 5.4 08/04/2021   HGB 12.6 08/04/2021   HCT 39.0 08/04/2021   MCV 90.7 08/04/2021   PLT 259 08/04/2021   Recent Labs    08/04/21 1035  NA 140  K 3.7  CL 102  CO2 30  GLUCOSE 105*  BUN 9  CREATININE 0.70  CALCIUM 9.0  GFRNONAA >60  PROT 7.4  ALBUMIN 3.7  AST 20  ALT 11  ALKPHOS 71  BILITOT 0.4    RADIOGRAPHIC STUDIES: I have personally reviewed the radiological images as listed and agreed with the findings in the report. No results found.  ASSESSMENT & PLAN:   Iron deficiency # iron deficiency [ferritin- AUG 2022- 32' iron sat-18%] Plavix; Vitron-C BID [liver/onion].  Hold off IV iron.    # ETIOLOGY: EGD on 08/18/2012; Colonoscopy on 03/10/2018 revealed diverticulosis in the sigmoid colon, in the descending colon, in the transverse colon and in the ascending colon.  There was one 3 mm polyp at the hepatic flexure (tubular adenoma) and two 4 to 6 mm polyps (hyperplastic) in the sigmoid colon.   # B12 deficiency: B12 pill once a week; Feb 2023- WNL.   # DISPOSITION: # follow up in 6 months- MD; 2-3 days prior-labs- cbc/cmp/B12 levels; Iron studies/ferritin- Dr.B   All questions were answered. The patient knows to call the clinic with any problems, questions or concerns.     Cammie Sickle, MD 08/08/2021 12:02 PM

## 2021-08-08 NOTE — Assessment & Plan Note (Signed)
#   iron deficiency [ferritin- AUG 2022- 32' iron sat-18%] Plavix; Vitron-C BID [liver/onion].  Hold off IV iron.   # ETIOLOGY: EGDon 08/18/2012; Colonoscopyon 03/10/2018 revealed diverticulosis in the sigmoid colon, in the descending colon, in the transverse colon and in the ascending colon. There was one 3 mm polyp at the hepatic flexure (tubular adenoma) and two 4 to 6 mm polyps (hyperplastic) in the sigmoid colon.  # B12 deficiency: B12 pill once a week; Feb 2023- WNL.   # DISPOSITION: # follow up in 6 months- MD; 2-3 days prior-labs- cbc/cmp/B12 levels; Iron studies/ferritin- Dr.B

## 2021-11-14 ENCOUNTER — Other Ambulatory Visit: Payer: Self-pay | Admitting: Family Medicine

## 2021-11-14 ENCOUNTER — Other Ambulatory Visit: Payer: Self-pay

## 2021-11-14 DIAGNOSIS — I679 Cerebrovascular disease, unspecified: Secondary | ICD-10-CM

## 2021-11-14 DIAGNOSIS — K219 Gastro-esophageal reflux disease without esophagitis: Secondary | ICD-10-CM

## 2021-11-14 DIAGNOSIS — I10 Essential (primary) hypertension: Secondary | ICD-10-CM

## 2021-11-14 MED ORDER — FAMOTIDINE 20 MG PO TABS: ORAL_TABLET | ORAL | 0 refills | Status: DC

## 2021-11-14 MED ORDER — LOSARTAN POTASSIUM 50 MG PO TABS: 50.0000 mg | ORAL_TABLET | Freq: Every day | ORAL | 0 refills | Status: DC

## 2021-12-28 ENCOUNTER — Other Ambulatory Visit: Payer: Self-pay | Admitting: Family Medicine

## 2021-12-28 DIAGNOSIS — M816 Localized osteoporosis [Lequesne]: Secondary | ICD-10-CM

## 2021-12-28 MED ORDER — ALENDRONATE SODIUM 70 MG PO TABS: ORAL_TABLET | ORAL | 1 refills | Status: DC

## 2021-12-28 NOTE — Telephone Encounter (Signed)
Requested medication (s) are due for refill today:   Yes  Requested medication (s) are on the active medication list:   Yes  Future visit scheduled:   Yes   Last ordered: 07/21/2021 #12, 1 refill  Returned because labs due per protocol.   Requested Prescriptions  Pending Prescriptions Disp Refills   alendronate (FOSAMAX) 70 MG tablet 12 tablet 1    Sig: TAKE 1 TABLET(70 MG) BY MOUTH 1 TIME A WEEK WITH A FULL GLASS OF WATER AND ON AN EMPTY STOMACH     Endocrinology:  Bisphosphonates Failed - 12/28/2021 12:00 PM      Failed - Vitamin D in normal range and within 360 days    No results found for: "[id number]", "OL0786LJ4", "VD125OH2TOT", "25OHVITD3", "25OHVITD2", "25OHVITD1", "VD25OH"       Failed - Mg Level in normal range and within 360 days    No results found for: "MG"       Failed - Phosphate in normal range and within 360 days    Phosphorus  Date Value Ref Range Status  12/10/2018 4.3 3.0 - 4.3 mg/dL Final         Passed - Ca in normal range and within 360 days    Calcium  Date Value Ref Range Status  08/04/2021 9.0 8.9 - 10.3 mg/dL Final   Calcium, Total  Date Value Ref Range Status  12/31/2012 9.1 8.5 - 10.1 mg/dL Final         Passed - Cr in normal range and within 360 days    Creatinine  Date Value Ref Range Status  12/31/2012 0.79 0.60 - 1.30 mg/dL Final   Creatinine, Ser  Date Value Ref Range Status  08/04/2021 0.70 0.44 - 1.00 mg/dL Final         Passed - eGFR is 30 or above and within 360 days    EGFR (African American)  Date Value Ref Range Status  12/31/2012 >60  Final   GFR calc Af Amer  Date Value Ref Range Status  07/19/2020 102 >59 mL/min/1.73 Final    Comment:    **In accordance with recommendations from the NKF-ASN Task force,**   [facility] is in the process of updating its eGFR calculation to the   2021 CKD-EPI creatinine equation that estimates kidney function   without a race variable.    EGFR (Non-African Amer.)  Date Value Ref Range  Status  12/31/2012 >60  Final    Comment:    eGFR values <1m/min/1.73 m2 may be an indication of chronic kidney disease (CKD). Calculated eGFR is useful in patients with stable renal function. The eGFR calculation will not be reliable in acutely ill patients when serum creatinine is changing rapidly. It is not useful in  patients on dialysis. The eGFR calculation may not be applicable to patients at the low and high extremes of body sizes, pregnant women, and vegetarians.    GFR, Estimated  Date Value Ref Range Status  08/04/2021 >60 >60 mL/min Final    Comment:    (NOTE) Calculated using the CKD-EPI Creatinine Equation (2021)          Passed - Valid encounter within last 12 months    Recent Outpatient Visits           5 months ago Essential hypertension   [place] MD   11 months ago Essential hypertension   [facility] JJuline Patch MD   1 year ago Essential hypertension   [facility] Juline Patch, MD   1 year ago Essential hypertension   [place], MD   2 years ago Essential hypertension   [place], MD       Future Appointments             In 3 weeks Juline Patch, MD [place] Density or Dexa Scan completed in the last 2 years

## 2021-12-28 NOTE — Telephone Encounter (Signed)
Medication Refill - Medication: alendronate (FOSAMAX) 70 MG tablet  Has the patient contacted their pharmacy? Yes.   (Agent: If no, request that the patient contact the pharmacy for the refill. If patient does not wish to contact the pharmacy document the reason why and proceed with request.) (Agent: If yes, when and what did the pharmacy advise?)  Preferred Pharmacy (with phone number or street name):  [id number] - [place], [place] [address] AT [place] Phone:  [phone]  Fax:  [phone]     Has the patient been seen for an appointment in the last year OR does the patient have an upcoming appointment? Yes.    Agent: Please be advised that RX refills may take up to 3 business days. We ask that you follow-up with your pharmacy.

## 2022-01-18 ENCOUNTER — Ambulatory Visit (INDEPENDENT_AMBULATORY_CARE_PROVIDER_SITE_OTHER): Payer: PPO | Admitting: Family Medicine

## 2022-01-18 ENCOUNTER — Encounter: Payer: Self-pay | Admitting: Family Medicine

## 2022-01-18 ENCOUNTER — Other Ambulatory Visit: Payer: Self-pay

## 2022-01-18 DIAGNOSIS — I10 Essential (primary) hypertension: Secondary | ICD-10-CM | POA: Diagnosis not present

## 2022-01-18 DIAGNOSIS — E785 Hyperlipidemia, unspecified: Secondary | ICD-10-CM | POA: Diagnosis not present

## 2022-01-18 DIAGNOSIS — I679 Cerebrovascular disease, unspecified: Secondary | ICD-10-CM

## 2022-01-18 DIAGNOSIS — K219 Gastro-esophageal reflux disease without esophagitis: Secondary | ICD-10-CM | POA: Diagnosis not present

## 2022-01-18 DIAGNOSIS — H8110 Benign paroxysmal vertigo, unspecified ear: Secondary | ICD-10-CM | POA: Diagnosis not present

## 2022-01-18 MED ORDER — LOSARTAN POTASSIUM 50 MG PO TABS: 50.0000 mg | ORAL_TABLET | Freq: Every day | ORAL | 1 refills | Status: DC

## 2022-01-18 MED ORDER — CLOPIDOGREL BISULFATE 75 MG PO TABS: ORAL_TABLET | ORAL | 1 refills | Status: DC

## 2022-01-18 MED ORDER — SIMVASTATIN 20 MG PO TABS
ORAL_TABLET | ORAL | 1 refills | Status: DC
Start: 2022-01-18 — End: 2022-07-31

## 2022-01-18 MED ORDER — FAMOTIDINE 20 MG PO TABS: ORAL_TABLET | ORAL | 1 refills | Status: DC

## 2022-01-18 NOTE — Patient Instructions (Signed)

## 2022-01-18 NOTE — Progress Notes (Signed)
Date:  01/18/2022   Name:  Ellen Baker   DOB:  1951/03/03   MRN:  [id number]   Chief Complaint: Hyperlipidemia, Hypertension, Gastroesophageal Reflux, and Cerebrovascular Accident  Hyperlipidemia This is a chronic problem. The current episode started more than 1 year ago. The problem is controlled. Recent lipid tests were reviewed and are normal. She has no history of chronic renal disease, diabetes, hypothyroidism, liver disease, obesity or nephrotic syndrome. There are no known factors aggravating her hyperlipidemia. Associated symptoms include chest pain. Current antihyperlipidemic treatment includes statins. The current treatment provides moderate improvement of lipids. There are no compliance problems.   Hypertension This is a chronic problem. The current episode started more than 1 year ago. The problem has been gradually improving since onset. The problem is controlled. Associated symptoms include chest pain and palpitations. Pertinent negatives include no neck pain. Risk factors for coronary artery disease include dyslipidemia. Past treatments include angiotensin blockers. The current treatment provides moderate improvement. Hypertensive end-organ damage includes CVA. There is no history of chronic renal disease.  Gastroesophageal Reflux She complains of chest pain. She reports no abdominal pain, no belching, no coughing, no dysphagia, no early satiety, no globus sensation, no hoarse voice, no sore throat or no stridor. This is a chronic problem. The problem has been gradually improving. She has tried a histamine-2 antagonist for the symptoms. The treatment provided moderate relief.  Cerebrovascular Accident Associated symptoms include chest pain. Pertinent negatives include no abdominal pain, coughing, neck pain or sore throat.    Lab Results  Component Value Date   NA 140 08/04/2021   K 3.7 08/04/2021   CO2 30 08/04/2021   GLUCOSE 105 (H) 08/04/2021   BUN 9 08/04/2021    CREATININE 0.70 08/04/2021   CALCIUM 9.0 08/04/2021   GFRNONAA >60 08/04/2021   Lab Results  Component Value Date   CHOL 171 01/17/2021   HDL 59 01/17/2021   LDLCALC 92 01/17/2021   TRIG 115 01/17/2021   CHOLHDL 2.6 12/05/2017   Lab Results  Component Value Date   TSH 1.757 07/21/2018   Lab Results  Component Value Date   HGBA1C 5.3 01/01/2013   Lab Results  Component Value Date   WBC 5.4 08/04/2021   HGB 12.6 08/04/2021   HCT 39.0 08/04/2021   MCV 90.7 08/04/2021   PLT 259 08/04/2021   Lab Results  Component Value Date   ALT 11 08/04/2021   AST 20 08/04/2021   ALKPHOS 71 08/04/2021   BILITOT 0.4 08/04/2021   No results found for: "25OHVITD2", "25OHVITD3", "VD25OH"   Review of Systems  HENT:  Negative for hoarse voice and sore throat.   Respiratory:  Negative for cough.   Cardiovascular:  Positive for chest pain and palpitations.  Gastrointestinal:  Negative for abdominal pain and dysphagia.  Musculoskeletal:  Negative for neck pain.    Patient Active Problem List   Diagnosis Date Noted   Iron deficiency 02/07/2021   Status post total knee replacement using cement, right 12/01/2019   B12 deficiency 09/24/2018   Normocytic anemia 07/20/2018   Primary osteoarthritis of right knee 02/04/2018   Status post total knee replacement using cement, left 02/04/2018   Obesity (BMI 35.0-39.9 without comorbidity) 01/18/2017   Primary osteoarthritis of left knee 01/18/2017   Cerebral vascular insufficiency 06/06/2015   Dyslipidemia 06/06/2015   Aftercare following surgery of the circulatory system, NEC 08/03/2013   Occlusion and stenosis of carotid artery without mention of cerebral infarction 01/16/2013   Pre-operative  cardiovascular examination 01/16/2013   Carotid artery stenosis, symptomatic 01/02/2013   Occlusion and stenosis of unspecified carotid artery 01/02/2013   CVA (cerebral infarction) 12/31/2012   HTN (hypertension) 12/31/2012   GERD (gastroesophageal  reflux disease) 12/31/2012   Cerebral artery occlusion with cerebral infarction ([place]) 12/31/2012   Cerebral infarction ([place]) 12/31/2012   Esophageal reflux 12/31/2012   Essential hypertension 12/31/2012    Allergies  Allergen Reactions   Adhesive [Tape] Rash    Skin became RAW    Past Surgical History:  Procedure Laterality Date   CAROTID ENDARTERECTOMY     RIGHT  01/19/13   COLONOSCOPY  08-18-12   COLONOSCOPY WITH PROPOFOL N/A 03/10/2018   Procedure: COLONOSCOPY WITH PROPOFOL;  Surgeon: Lollie Sails, MD;  Location: [facility] ENDOSCOPY;  Service: Endoscopy;  Laterality: N/A;   DILATION AND CURETTAGE OF UTERUS     ENDARTERECTOMY Right 01/19/2013   Procedure: ENDARTERECTOMY CAROTID with patch angioplasty;  Surgeon: Conrad Washoe, MD;  Location: [place];  Service: Vascular;  Laterality: Right;   ESOPHAGEAL DILATION     JOINT REPLACEMENT Left 04/15/2017   taotal knee arthorplasty Right 2021   TOTAL KNEE ARTHROPLASTY Right 12/01/2019   Procedure: TOTAL KNEE ARTHROPLASTY - RNFA;  Surgeon: Corky Mull, MD;  Location: [facility];  Service: Orthopedics;  Laterality: Right;   TUBAL LIGATION     UPPER GI ENDOSCOPY  08-18-12    Social History   Tobacco Use   Smoking status: Never   Smokeless tobacco: Never   Tobacco comments:    Smoking cessation materials not required  Vaping Use   Vaping Use: Never used  Substance Use Topics   Alcohol use: No    Alcohol/week: 0.0 standard drinks of alcohol   Drug use: No     Medication list has been reviewed and updated.  Current Meds  Medication Sig   alendronate (FOSAMAX) 70 MG tablet TAKE 1 TABLET(70 MG) BY MOUTH 1 TIME A WEEK WITH A FULL GLASS OF WATER AND ON AN EMPTY STOMACH   Calcium Carbonate-Vitamin D3 600-400 MG-UNIT TABS Take 1 tablet by mouth daily.   clopidogrel (PLAVIX) 75 MG tablet TAKE 1 TABLET BY MOUTH DAILY   famotidine (PEPCID) 20 MG tablet TAKE 1 TABLET(20 MG) BY MOUTH TWICE DAILY   ibuprofen (ADVIL) 200 MG tablet Take by mouth.    Iron-Vitamin C 65-125 MG TABS Take 1 tablet by mouth 2 (two) times daily.   loratadine (CLARITIN) 10 MG tablet Take 1 tablet (10 mg total) by mouth continuous as needed for allergies.   losartan (COZAAR) 50 MG tablet Take 1 tablet (50 mg total) by mouth daily.   meclizine (ANTIVERT) 25 MG tablet Take 1 tablet (25 mg total) by mouth 3 (three) times daily as needed for dizziness.   Multiple Vitamins-Minerals (CENTRUM SILVER ULTRA WOMENS) TABS Take 1 tablet by mouth daily.   simvastatin (ZOCOR) 20 MG tablet TAKE 1 TABLET(20 MG) BY MOUTH DAILY   vitamin B-12 (CYANOCOBALAMIN) 1000 MCG tablet Take 1,000 mcg by mouth once a week.       01/18/2022   10:13 AM 07/21/2021    9:10 AM 01/17/2021   10:13 AM 01/01/2020   10:36 AM  GAD 7 : Generalized Anxiety Score  Nervous, Anxious, on Edge 0 0 0 0  Control/stop worrying 0 1 0 0  Worry too much - different things 0 0 0 0  Trouble relaxing 0 0 0 0  Restless 0 0 0 0  Easily annoyed or irritable 0  0 0 0  Afraid - awful might happen 0 0 0 0  Total GAD 7 Score 0 1 0 0  Anxiety Difficulty Not difficult at all Not difficult at all         01/18/2022   10:13 AM 07/21/2021    9:10 AM 06/05/2021   11:25 AM  Depression screen PHQ 2/9  Decreased Interest 0 0 0  Down, Depressed, Hopeless 0 0 0  PHQ - 2 Score 0 0 0  Altered sleeping 2 0   Tired, decreased energy 2 0   Change in appetite 0 0   Feeling bad or failure about yourself  0 0   Trouble concentrating 0 0   Moving slowly or fidgety/restless 0 0   Suicidal thoughts 0 0   PHQ-9 Score 4 0   Difficult doing work/chores Not difficult at all Not difficult at all     BP Readings from Last 3 Encounters:  08/08/21 (!) 154/71  07/21/21 (!) 158/98  02/21/21 (!) 162/82    Physical Exam Vitals and nursing note reviewed. Exam conducted with a chaperone present.  Constitutional:      General: She is not in acute distress.    Appearance: She is not diaphoretic.  HENT:     Head: Normocephalic and  atraumatic.     Right Ear: External ear normal.     Left Ear: External ear normal.     Nose: Nose normal.  Eyes:     General:        Right eye: No discharge.        Left eye: No discharge.     Conjunctiva/sclera: Conjunctivae normal.     Pupils: Pupils are equal, round, and reactive to light.  Neck:     Thyroid: No thyromegaly.     Vascular: No JVD.  Cardiovascular:     Rate and Rhythm: Normal rate and regular rhythm.     Heart sounds: Normal heart sounds. No murmur heard.    No friction rub. No gallop.  Pulmonary:     Effort: Pulmonary effort is normal.     Breath sounds: Normal breath sounds. No wheezing, rhonchi or rales.  Abdominal:     General: Bowel sounds are normal.     Palpations: Abdomen is soft. There is no mass.     Tenderness: There is no abdominal tenderness. There is no guarding.  Musculoskeletal:        General: Normal range of motion.     Cervical back: Normal range of motion and neck supple.  Lymphadenopathy:     Cervical: No cervical adenopathy.  Skin:    General: Skin is warm and dry.  Neurological:     Mental Status: She is alert.     Deep Tendon Reflexes: Reflexes are normal and symmetric.     Wt Readings from Last 3 Encounters:  01/18/22 210 lb (95.3 kg)  08/08/21 217 lb (98.4 kg)  07/21/21 213 lb (96.6 kg)    Pulse 76   Ht '5\' 4"'$  (1.626 m)   Wt 210 lb (95.3 kg)   BMI 36.05 kg/m   Assessment and Plan:  1. Cerebral vascular insufficiency Chronic.  Controlled.  Stable.  No further episodes suggesting cerebrovascular insufficiency.  Continue Plavix 75 mg once a day. - clopidogrel (PLAVIX) 75 MG tablet; TAKE 1 TABLET BY MOUTH DAILY  Dispense: 90 tablet; Refill: 1  2. Gastroesophageal reflux disease, unspecified whether esophagitis present Chronic.  Controlled.  Stable.  Continue famotidine 20 mg twice  a day. - famotidine (PEPCID) 20 MG tablet; TAKE 1 TABLET(20 MG) BY MOUTH TWICE DAILY  Dispense: 180 tablet; Refill: 1  3. Essential  hypertension Chronic.  Controlled.  Stable.  Blood pressure by monitor from urgent of right arm using large cuff is 112/68.  Continue losartan 50 mg once a day. - losartan (COZAAR) 50 MG tablet; Take 1 tablet (50 mg total) by mouth daily.  Dispense: 90 tablet; Refill: 1  4. Benign paroxysmal positional vertigo, unspecified laterality Chronic.  Controlled.  Stable.  May continue meclizine 25 mg on an as-needed basis.  5. Dyslipidemia Chronic.  Controlled.  Stable.  Noted.  Will check lipid panel. - simvastatin (ZOCOR) 20 MG tablet; TAKE 1 TABLET(20 MG) BY MOUTH DAILY  Dispense: 90 tablet; Refill: 1 - Lipid Panel With LDL/HDL Ratio

## 2022-01-19 LAB — LIPID PANEL WITH LDL/HDL RATIO
Cholesterol, Total: 171 mg/dL (ref 100–199)
HDL: 61 mg/dL (ref >39)
LDL Chol Calc (NIH): 91 mg/dL (ref 0–99)
LDL/HDL Ratio: 1.5 ratio (ref 0.0–3.2)
Triglycerides: 108 mg/dL (ref 0–149)
VLDL Cholesterol Cal: 19 mg/dL (ref 5–40)

## 2022-01-26 ENCOUNTER — Encounter: Payer: Self-pay | Admitting: Urgent Care

## 2022-02-02 ENCOUNTER — Inpatient Hospital Stay: Payer: PPO | Attending: Oncology

## 2022-02-02 DIAGNOSIS — Z79899 Other long term (current) drug therapy: Secondary | ICD-10-CM | POA: Diagnosis not present

## 2022-02-02 DIAGNOSIS — Z86010 Personal history of colonic polyps: Secondary | ICD-10-CM | POA: Insufficient documentation

## 2022-02-02 DIAGNOSIS — E611 Iron deficiency: Secondary | ICD-10-CM

## 2022-02-02 DIAGNOSIS — I10 Essential (primary) hypertension: Secondary | ICD-10-CM | POA: Insufficient documentation

## 2022-02-02 DIAGNOSIS — Z8673 Personal history of transient ischemic attack (TIA), and cerebral infarction without residual deficits: Secondary | ICD-10-CM | POA: Diagnosis not present

## 2022-02-02 DIAGNOSIS — D509 Iron deficiency anemia, unspecified: Secondary | ICD-10-CM | POA: Insufficient documentation

## 2022-02-02 DIAGNOSIS — Z7902 Long term (current) use of antithrombotics/antiplatelets: Secondary | ICD-10-CM | POA: Insufficient documentation

## 2022-02-02 DIAGNOSIS — R011 Cardiac murmur, unspecified: Secondary | ICD-10-CM | POA: Diagnosis not present

## 2022-02-02 DIAGNOSIS — E785 Hyperlipidemia, unspecified: Secondary | ICD-10-CM

## 2022-02-02 DIAGNOSIS — R5383 Other fatigue: Secondary | ICD-10-CM | POA: Diagnosis not present

## 2022-02-02 DIAGNOSIS — K219 Gastro-esophageal reflux disease without esophagitis: Secondary | ICD-10-CM | POA: Insufficient documentation

## 2022-02-02 DIAGNOSIS — Z803 Family history of malignant neoplasm of breast: Secondary | ICD-10-CM | POA: Insufficient documentation

## 2022-02-02 DIAGNOSIS — E538 Deficiency of other specified B group vitamins: Secondary | ICD-10-CM | POA: Insufficient documentation

## 2022-02-02 LAB — COMPREHENSIVE METABOLIC PANEL
ALT: 12 U/L (ref 0–44)
AST: 21 U/L (ref 15–41)
Albumin: 3.8 g/dL (ref 3.5–5.0)
Alkaline Phosphatase: 70 U/L (ref 38–126)
Anion gap: 5 (ref 5–15)
BUN: 12 mg/dL (ref 8–23)
CO2: 31 mmol/L (ref 22–32)
Calcium: 9.4 mg/dL (ref 8.9–10.3)
Chloride: 104 mmol/L (ref 98–111)
Creatinine, Ser: 0.82 mg/dL (ref 0.44–1.00)
GFR, Estimated: >60 mL/min (ref >60)
Glucose, Bld: 120 mg/dL — ABNORMAL HIGH (ref 70–99)
Potassium: 3.7 mmol/L (ref 3.5–5.1)
Sodium: 140 mmol/L (ref 135–145)
Total Bilirubin: 0.9 mg/dL (ref 0.3–1.2)
Total Protein: 7.7 g/dL (ref 6.5–8.1)

## 2022-02-02 LAB — CBC WITH DIFFERENTIAL/PLATELET
Abs Immature Granulocytes: 0.09 10*3/uL — ABNORMAL HIGH (ref 0.00–0.07)
Basophils Absolute: 0.1 10*3/uL (ref 0.0–0.1)
Basophils Relative: 1 %
Eosinophils Absolute: 0.2 10*3/uL (ref 0.0–0.5)
Eosinophils Relative: 3 %
HCT: 42 % (ref 36.0–46.0)
Hemoglobin: 13.6 g/dL (ref 12.0–15.0)
Immature Granulocytes: 1 %
Lymphocytes Relative: 22 %
Lymphs Abs: 1.5 10*3/uL (ref 0.7–4.0)
MCH: 30.6 pg (ref 26.0–34.0)
MCHC: 32.4 g/dL (ref 30.0–36.0)
MCV: 94.6 fL (ref 80.0–100.0)
Monocytes Absolute: 0.6 10*3/uL (ref 0.1–1.0)
Monocytes Relative: 9 %
Neutro Abs: 4.2 10*3/uL (ref 1.7–7.7)
Neutrophils Relative %: 64 %
Platelets: 254 10*3/uL (ref 150–400)
RBC: 4.44 MIL/uL (ref 3.87–5.11)
RDW: 12.6 % (ref 11.5–15.5)
WBC: 6.7 10*3/uL (ref 4.0–10.5)
nRBC: 0 % (ref 0.0–0.2)

## 2022-02-02 LAB — FERRITIN: Ferritin: 25 ng/mL (ref 11–307)

## 2022-02-02 LAB — LIPID PANEL
Cholesterol: 172 mg/dL (ref 0–200)
HDL: 60 mg/dL (ref >40)
LDL Cholesterol: 87 mg/dL (ref 0–99)
Total CHOL/HDL Ratio: 2.9 RATIO
Triglycerides: 125 mg/dL (ref <150)
VLDL: 25 mg/dL (ref 0–40)

## 2022-02-02 LAB — IRON AND TIBC
Iron: 121 ug/dL (ref 28–170)
Saturation Ratios: 26 % (ref 10.4–31.8)
TIBC: 463 ug/dL — ABNORMAL HIGH (ref 250–450)
UIBC: 342 ug/dL

## 2022-02-02 LAB — VITAMIN B12: Vitamin B-12: 590 pg/mL (ref 180–914)

## 2022-02-06 ENCOUNTER — Inpatient Hospital Stay: Payer: PPO | Admitting: Medical Oncology

## 2022-02-06 ENCOUNTER — Ambulatory Visit: Payer: PPO | Admitting: Internal Medicine

## 2022-02-06 ENCOUNTER — Encounter: Payer: Self-pay | Admitting: Medical Oncology

## 2022-02-06 VITALS — BP 159/81 | HR 64 | Temp 97.7°F | Resp 16 | Wt 211.0 lb

## 2022-02-06 DIAGNOSIS — E611 Iron deficiency: Secondary | ICD-10-CM

## 2022-02-06 DIAGNOSIS — D649 Anemia, unspecified: Secondary | ICD-10-CM

## 2022-02-06 DIAGNOSIS — D509 Iron deficiency anemia, unspecified: Secondary | ICD-10-CM | POA: Diagnosis not present

## 2022-02-06 NOTE — Progress Notes (Signed)
[place] NOTE  Patient Care Team: Juline Patch, MD as PCP - General (Family Medicine) Reche Dixon, PA-C (Orthopedic Surgery) Juline Patch, MD (Family Medicine)  CHIEF COMPLAINTS/PURPOSE OF CONSULTATION:  Iron/B12 deficiency  #  Oncology History   No history exists.     HISTORY OF PRESENTING ILLNESS: Alone.  Ambulating independently.  [employer] Darwish 71 y.o.  female history of iron deficiency has B12/Iron deficiency is here for follow-up.  Patient reports that she has been on Vitron iron supplement with vitamin C. Also takes B12. She takes this in the afternoon to evening time. She continues to have mild fatigue but overall reports that she is feeling and doing well. No changes in stool, constipation, color changes of stool. UTD on colonoscopy.   Review of Systems  Constitutional:  Negative for chills, diaphoresis, fever, malaise/fatigue and weight loss.  HENT:  Negative for nosebleeds and sore throat.   Eyes:  Negative for double vision.  Respiratory:  Negative for cough, hemoptysis, sputum production, shortness of breath and wheezing.   Cardiovascular:  Negative for chest pain, palpitations, orthopnea and leg swelling.  Gastrointestinal:  Negative for abdominal pain, blood in stool, constipation, diarrhea, heartburn, melena, nausea and vomiting.  Genitourinary:  Negative for dysuria, frequency and urgency.  Musculoskeletal:  Negative for back pain and joint pain.  Skin: Negative.  Negative for itching and rash.  Neurological:  Negative for dizziness, tingling, focal weakness, weakness and headaches.  Endo/Heme/Allergies:  Does not bruise/bleed easily.  Psychiatric/Behavioral:  Negative for depression. The patient is not nervous/anxious and does not have insomnia.      MEDICAL HISTORY:  Past Medical History:  Diagnosis Date   Arthritis    right knee   Carotid artery occlusion    Dizziness    GERD (gastroesophageal reflux disease)     Heart murmur    History of colon polyps    Hyperlipidemia    Hypertension    Iron deficiency    Stroke ([place]) 12-31-12   no residual effects    SURGICAL HISTORY: Past Surgical History:  Procedure Laterality Date   CAROTID ENDARTERECTOMY     RIGHT  01/19/13   COLONOSCOPY  08-18-12   COLONOSCOPY WITH PROPOFOL N/A 03/10/2018   Procedure: COLONOSCOPY WITH PROPOFOL;  Surgeon: Lollie Sails, MD;  Location: [facility] END[facility]COPY;  Service: Endoscopy;  Laterality: N/A;   DILATION AND CURETTAGE OF UTERUS     ENDARTERECTOMY Right 01/19/2013   Procedure: ENDARTERECTOMY CAROTID with patch angioplasty;  Surgeon: Conrad Arco, MD;  Location: [place];  Service: Vascular;  Laterality: Right;   ESOPHAGEAL DILATION     JOINT REPLACEMENT Left 04/15/2017   taotal knee arthorplasty Right 2021   TOTAL KNEE ARTHROPLASTY Right 12/01/2019   Procedure: TOTAL KNEE ARTHROPLASTY - RNFA;  Surgeon: Corky Mull, MD;  Location: [facility];  Service: Orthopedics;  Laterality: Right;   TUBAL LIGATION     UPPER GI ENDOSCOPY  08-18-12    SOCIAL HISTORY: Social History   Socioeconomic History   Marital status: Widowed    Spouse name: Not on file   Number of children: 3   Years of education: Not on file   Highest education level: 12th grade  Occupational History    Employer: [employer]  Tobacco Use   Smoking status: Never   Smokeless tobacco: Never   Tobacco comments:    Smoking cessation materials not required  Vaping Use   Vaping Use: Never used  Substance and Sexual Activity  Alcohol use: No    Alcohol/week: 0.0 standard drinks of alcohol   Drug use: No   Sexual activity: Never  Other Topics Concern   Not on file  Social History Narrative   Pt lives alone.    Social Determinants of Health   Financial Resource Strain: Low Risk  (06/05/2021)   Overall Financial Resource Strain (CARDIA)    Difficulty of Paying Living Expenses: Not hard at all  Food Insecurity: No Food Insecurity (06/05/2021)   Hunger Vital  Sign    Worried About Running Out of Food in the Last Year: Never true    Ran Out of Food in the Last Year: Never true  Transportation Needs: No Transportation Needs (06/05/2021)   PRAPARE - Hydrologist (Medical): No    Lack of Transportation (Non-Medical): No  Physical Activity: Inactive (06/05/2021)   Exercise Vital Sign    Days of Exercise per Week: 0 days    Minutes of Exercise per Session: 0 min  Stress: No Stress Concern Present (06/05/2021)   [place]    Feeling of Stress : Only a little  Social Connections: Moderately Isolated (06/05/2021)   Social Connection and Isolation Panel [NHANES]    Frequency of Communication with Friends and Family: More than three times a week    Frequency of Social Gatherings with Friends and Family: Three times a week    Attends Religious Services: More than 4 times per year    Active Member of Clubs or Organizations: No    Attends Archivist Meetings: Never    Marital Status: Widowed  Intimate Partner Violence: Not At Risk (06/05/2021)   Humiliation, Afraid, Rape, and Kick questionnaire    Fear of Current or Ex-Partner: No    Emotionally Abused: No    Physically Abused: No    Sexually Abused: No    FAMILY HISTORY: Family History  Problem Relation Age of Onset   Stroke Mother    Deep vein thrombosis Mother    Heart disease Mother        Amputation-   Hypertension Mother    Alzheimer's disease Mother    Heart failure Father    Heart disease Father        CHF   Hypertension Father    Varicose Veins Father    Parkinson's disease Father    Breast cancer Paternal Aunt 28    ALLERGIES:  is allergic to adhesive [tape].  MEDICATIONS:  Current Outpatient Medications  Medication Sig Dispense Refill   alendronate (FOSAMAX) 70 MG tablet TAKE 1 TABLET(70 MG) BY MOUTH 1 TIME A WEEK WITH A FULL GLASS OF WATER AND ON AN EMPTY STOMACH 12  tablet 1   Calcium Carbonate-Vitamin D3 600-400 MG-UNIT TABS Take 1 tablet by mouth daily.     clopidogrel (PLAVIX) 75 MG tablet TAKE 1 TABLET BY MOUTH DAILY 90 tablet 1   famotidine (PEPCID) 20 MG tablet TAKE 1 TABLET(20 MG) BY MOUTH TWICE DAILY 180 tablet 1   ibuprofen (ADVIL) 200 MG tablet Take by mouth.     Iron-Vitamin C 65-125 MG TABS Take 1 tablet by mouth 2 (two) times daily. 60 tablet 1   loratadine (CLARITIN) 10 MG tablet Take 1 tablet (10 mg total) by mouth continuous as needed for allergies. 30 tablet 6   losartan (COZAAR) 50 MG tablet Take 1 tablet (50 mg total) by mouth daily. 90 tablet 1   Multiple Vitamins-Minerals (CENTRUM  SILVER ULTRA WOMENS) TABS Take 1 tablet by mouth daily.     simvastatin (ZOCOR) 20 MG tablet TAKE 1 TABLET(20 MG) BY MOUTH DAILY 90 tablet 1   vitamin B-12 (CYANOCOBALAMIN) 1000 MCG tablet Take 1,000 mcg by mouth once a week.     meclizine (ANTIVERT) 25 MG tablet Take 1 tablet (25 mg total) by mouth 3 (three) times daily as needed for dizziness. (Patient not taking: Reported on 02/06/2022) 30 tablet 0   No current facility-administered medications for this visit.   PHYSICAL EXAMINATION: ECOG PERFORMANCE STATUS: 0 - Asymptomatic  Vitals:   02/06/22 1024  BP: (!) 159/81  Pulse: 64  Resp: 16  Temp: 97.7 F (36.5 C)  SpO2: 100%   Filed Weights   02/06/22 1024  Weight: 211 lb (95.7 kg)    Physical Exam Vitals and nursing note reviewed.  Constitutional:      Comments:     HENT:     Head: Normocephalic and atraumatic.     Mouth/Throat:     Mouth: Mucous membranes are moist.     Pharynx: No oropharyngeal exudate.  Eyes:     Pupils: Pupils are equal, round, and reactive to light.     Comments: Conjunctiva without pallor  Cardiovascular:     Rate and Rhythm: Normal rate and regular rhythm.  Pulmonary:     Effort: No respiratory distress.     Breath sounds: No wheezing.     Comments: Decreased breath sounds bilaterally at bases.  No wheeze or  crackles Abdominal:     General: Bowel sounds are normal. There is no distension.     Palpations: Abdomen is soft. There is no mass.     Tenderness: There is no abdominal tenderness. There is no guarding or rebound.  Musculoskeletal:        General: No tenderness. Normal range of motion.     Cervical back: Normal range of motion and neck supple.  Skin:    General: Skin is warm.  Neurological:     Mental Status: She is alert and oriented to person, place, and time.  Psychiatric:        Mood and Affect: Affect normal.        Judgment: Judgment normal.     LABORATORY DATA:  I have reviewed the data as listed Lab Results  Component Value Date   WBC 6.7 02/02/2022   HGB 13.6 02/02/2022   HCT 42.0 02/02/2022   MCV 94.6 02/02/2022   PLT 254 02/02/2022   Recent Labs    08/04/21 1035 02/02/22 1012  NA 140 140  K 3.7 3.7  CL 102 104  CO2 30 31  GLUCOSE 105* 120*  BUN 9 12  CREATININE 0.70 0.82  CALCIUM 9.0 9.4  GFRNONAA >60 >60  PROT 7.4 7.7  ALBUMIN 3.7 3.8  AST 20 21  ALT 11 12  ALKPHOS 71 70  BILITOT 0.4 0.9    RADIOGRAPHIC STUDIES: I have personally reviewed the radiological images as listed and agreed with the findings in the report. No results found.  ASSESSMENT & PLAN:   Encounter Diagnoses  Name Primary?   Iron deficiency Yes   Normocytic anemia    Chronic in nature. Improved with iron supplementation but not in target range. Discussed IV iron vs adjustment in how she is taking the iron supplement-from afternoon to morning to help with absorption. She prefers adjustment in iron timing. RTC 3 months with repeat labs(same as today).  Chronic in nature. Continue B12  supplementation.   All questions were answered. The patient knows to call the clinic with any problems, questions or concerns.     Hughie Closs, PA-C 02/06/2022 11:50 AM

## 2022-02-16 ENCOUNTER — Encounter: Payer: Self-pay | Admitting: Urgent Care

## 2022-02-20 ENCOUNTER — Other Ambulatory Visit: Payer: Self-pay | Admitting: Family Medicine

## 2022-02-20 DIAGNOSIS — K219 Gastro-esophageal reflux disease without esophagitis: Secondary | ICD-10-CM

## 2022-02-20 DIAGNOSIS — I679 Cerebrovascular disease, unspecified: Secondary | ICD-10-CM

## 2022-02-20 DIAGNOSIS — I10 Essential (primary) hypertension: Secondary | ICD-10-CM

## 2022-02-20 DIAGNOSIS — E785 Hyperlipidemia, unspecified: Secondary | ICD-10-CM

## 2022-02-20 NOTE — Telephone Encounter (Signed)
Copied from [place] [id number]. Topic: Quick Communication - See Telephone Encounter >> Feb 20, 2022  9:34 AM Carroll Kinds wrote: CRM for notification. See Telephone encounter for: 02/20/22.  Medication Refill - Medication:  all meds 90 days for 3 months -- clopidogrel (PLAVIX) 75 MG tablet -- famotidine (PEPCID) 20 MG tablet --losartan (COZAAR) 50 MG tablet --simvastatin   Pt stats she is very low to completely out of medications.  Has the patient contacted their pharmacy? No. (Agent: If no, request that the patient contact the pharmacy for the refill. If patient does not wish to contact the pharmacy document the reason why and proceed with request.) (Agent: If yes, when and what did the pharmacy advise?)  Preferred Pharmacy (with phone number or street name):  [facility] [place], [place] [address]  [place] [address]  Phone: [phone] Fax: [phone]   Has the patient been seen for an appointment in the last year OR does the patient have an upcoming appointment? Yes.    Agent: Please be advised that RX refills may take up to 3 business days. We ask that you follow-up with your pharmacy.

## 2022-02-21 NOTE — Telephone Encounter (Signed)
Called pt to let her know that refills will be ready after 11am tomorrow.

## 2022-02-21 NOTE — Telephone Encounter (Signed)
All 4 being refilled with available refills already at pharmacy. Requested Prescriptions  Pending Prescriptions Disp Refills  . clopidogrel (PLAVIX) 75 MG tablet 90 tablet 1    Sig: TAKE 1 TABLET BY MOUTH DAILY     Hematology: Antiplatelets - clopidogrel Passed - 02/20/2022 12:29 PM      Passed - HCT in normal range and within 180 days    HCT  Date Value Ref Range Status  02/02/2022 42.0 36.0 - 46.0 % Final   Hematocrit  Date Value Ref Range Status  06/06/2018 30.3 (L) 34.0 - 46.6 % Final         Passed - HGB in normal range and within 180 days    Hemoglobin  Date Value Ref Range Status  02/02/2022 13.6 12.0 - 15.0 g/dL Final  06/27/2018 9.6 (L) 11.1 - 15.9 g/dL Final         Passed - PLT in normal range and within 180 days    Platelets  Date Value Ref Range Status  02/02/2022 254 150 - 400 K/uL Final  06/06/2018 290 150 - 450 x10E3/uL Final         Passed - Cr in normal range and within 360 days    Creatinine  Date Value Ref Range Status  12/31/2012 0.79 0.60 - 1.30 mg/dL Final   Creatinine, Ser  Date Value Ref Range Status  02/02/2022 0.82 0.44 - 1.00 mg/dL Final         Passed - Valid encounter within last 6 months    Recent Outpatient Visits          1 month ago Cerebral vascular insufficiency   [place] [facility] at [place], Deanna C, MD   7 months ago Essential hypertension   [place] [facility] at [place], Deanna C, MD   1 year ago Essential hypertension   [place] [facility] at [place], Deanna C, MD   1 year ago Essential hypertension   [place] [facility] at [place], Deanna C, MD   2 years ago Essential hypertension   [place] [facility] at [place], Deanna C, MD      Future Appointments            In 5 months Juline Patch, MD [facility]  and [facility] Medicine at [facility]           . famotidine (PEPCID) 20 MG tablet 180 tablet 1    Sig: TAKE 1 TABLET(20 MG) BY MOUTH TWICE DAILY     Gastroenterology:  H2 Antagonists Passed - 02/20/2022 12:29 PM      Passed - Valid encounter within last 12 months    Recent Outpatient Visits          1 month ago Cerebral vascular insufficiency   [place] [facility] at [place], Deanna C, MD   7 months ago Essential hypertension   [place] [facility] Medicine at [place], Deanna C, MD   1 year ago Essential hypertension   [place] [facility] at [place], Deanna C, MD   1 year ago Essential hypertension   [place] [facility] at [place], Deanna C, MD   2 years ago Essential hypertension   [place]  [facility] at [facility], MD      Future Appointments            In 5 months Juline Patch, MD [facility] and [facility] Medicine at [facility], [facility]           . losartan (COZAAR) 50 MG tablet 90 tablet 1    Sig: Take 1 tablet (50 mg total) by mouth daily.     Cardiovascular:  Angiotensin Receptor Blockers Failed - 02/20/2022 12:29 PM      Failed - Last BP in normal range    BP Readings from Last 1 Encounters:  02/06/22 (!) 159/81         Passed - Cr in normal range and within 180 days    Creatinine  Date Value Ref Range Status  12/31/2012 0.79 0.60 - 1.30 mg/dL Final   Creatinine, Ser  Date Value Ref Range Status  02/02/2022 0.82 0.44 - 1.00 mg/dL Final         Passed - K in normal range and within 180 days    Potassium  Date Value Ref Range Status  02/02/2022 3.7 3.5 - 5.1 mmol/L Final  12/31/2012 3.4 (L) 3.5 - 5.1 mmol/L Final         Passed - Patient is not pregnant      Passed - Valid encounter within last 6 months    Recent Outpatient Visits           1 month ago Cerebral vascular insufficiency   [place] [facility] at [place], Deanna C, MD   7 months ago Essential hypertension   [place] and Sports Medicine at [place], Deanna C, MD   1 year ago Essential hypertension   [place] [facility] at [place], Deanna C, MD   1 year ago Essential hypertension   [place] [facility] at [place], Deanna C, MD   2 years ago Essential hypertension   [place] [facility] at [place], [place], MD      Future Appointments            In 5 months Juline Patch, MD [facility] and [facility] Medicine at [facility], [facility]           . simvastatin (ZOCOR) 20 MG tablet 90 tablet 1    Sig: TAKE 1 TABLET(20 MG) BY MOUTH DAILY     Cardiovascular:  Antilipid - Statins Failed - 02/20/2022 12:29 PM      Failed - Lipid Panel in normal range within the last 12 months    Cholesterol, Total  Date Value Ref Range Status  01/18/2022 171 100 - 199 mg/dL Final   Cholesterol  Date Value Ref Range Status  02/02/2022 172 0 - 200 mg/dL Final   LDL Chol Calc (NIH)  Date Value Ref Range Status  01/18/2022 91 0 - 99 mg/dL Final   LDL Cholesterol  Date Value Ref Range Status  02/02/2022 87 0 - 99 mg/dL Final    Comment:           Total Cholesterol/HDL:CHD Risk Coronary Heart Disease Risk Table                     Men   Women  1/2 Average Risk   3.4  3.3  Average Risk       5.0   4.4  2 X Average Risk   9.6   7.1  3 X Average Risk  23.4   11.0        Use the calculated Patient Ratio above and the CHD Risk Table to determine the patient's CHD Risk.        ATP III CLASSIFICATION (LDL):  <100     mg/dL   Optimal  100-129  mg/dL   Near or Above                    Optimal  130-159  mg/dL   Borderline  160-189  mg/dL   High  >190     mg/dL   Very  High Performed at [facility], [place], [place] [address]    HDL  Date Value Ref Range Status  02/02/2022 60 >40 mg/dL Final  01/18/2022 61 >39 mg/dL Final   Triglycerides  Date Value Ref Range Status  02/02/2022 125 <150 mg/dL Final         Passed - Patient is not pregnant      Passed - Valid encounter within last 12 months    Recent Outpatient Visits          1 month ago Cerebral vascular insufficiency   [place] [facility] at [place], Deanna C, MD   7 months ago Essential hypertension   [place] [facility] at [place], Deanna C, MD   1 year ago Essential hypertension   [place] [facility] at [place], Deanna C, MD   1 year ago Essential hypertension   [place] [facility] at [place], Deanna C, MD   2 years ago Essential hypertension   [place] [facility] at [place], [place], MD      Future Appointments            In 5 months Juline Patch, MD [place] [facility] at [facility], [facility]

## 2022-02-22 ENCOUNTER — Other Ambulatory Visit (INDEPENDENT_AMBULATORY_CARE_PROVIDER_SITE_OTHER): Payer: Self-pay | Admitting: Vascular Surgery

## 2022-02-22 DIAGNOSIS — I6523 Occlusion and stenosis of bilateral carotid arteries: Secondary | ICD-10-CM

## 2022-02-23 ENCOUNTER — Ambulatory Visit (INDEPENDENT_AMBULATORY_CARE_PROVIDER_SITE_OTHER): Payer: PPO

## 2022-02-23 ENCOUNTER — Ambulatory Visit (INDEPENDENT_AMBULATORY_CARE_PROVIDER_SITE_OTHER): Payer: PPO | Admitting: Vascular Surgery

## 2022-02-23 DIAGNOSIS — I6523 Occlusion and stenosis of bilateral carotid arteries: Secondary | ICD-10-CM

## 2022-03-06 DIAGNOSIS — H2513 Age-related nuclear cataract, bilateral: Secondary | ICD-10-CM | POA: Diagnosis not present

## 2022-03-12 ENCOUNTER — Ambulatory Visit
Admission: EM | Admit: 2022-03-12 | Discharge: 2022-03-12 | Disposition: A | Discharge status: Home / Self Care | Payer: PPO | Attending: Physician Assistant | Admitting: Physician Assistant

## 2022-03-12 ENCOUNTER — Telehealth: Payer: Self-pay

## 2022-03-12 ENCOUNTER — Ambulatory Visit (INDEPENDENT_AMBULATORY_CARE_PROVIDER_SITE_OTHER): Payer: PPO

## 2022-03-12 DIAGNOSIS — R0982 Postnasal drip: Secondary | ICD-10-CM

## 2022-03-12 NOTE — Telephone Encounter (Signed)
Pt came walking in c/o shortness of breath. She did not have an appt. With cardio- has not seen them since 2014. I called them and got her an appt to be seen, but they can't see her until Oct 3 at 23 in [place]. She has been advised, in the meantime to go to urgent care. We are still seeing patients for the morning. Pt is going to go to urgent care for further evaluation

## 2022-03-12 NOTE — ED Provider Notes (Signed)
Patient presents to urgent care for 47-monthhistory of nasal congestion/postnasal drip, cough and shortness of breath.  I reviewed patient's chart which shows stable vital signs.  Ordered a chest x-ray to be performed before I saw patient.  Patient had the chest x-ray and then proceeded to get dressed and leave without being seen by me.  Multiple attempts made to contact patient to advise her to return for evaluation went unanswered.  Patient was not seen and evaluated by provider.   EDanton Clap PA-C 03/12/22 1551

## 2022-03-12 NOTE — ED Notes (Signed)
Was told by the security guard that the patient got dressed and left the urgent care after receiving her Xray. Pt was not seen by provider before leaving the urgent care. Attempted to contact patient by number in chart. Phone rang 4 times and then went to a dial tone.

## 2022-03-12 NOTE — ED Triage Notes (Signed)
Patient presents to UC for post nasal drip since 2 months. Treating symptoms with OTC Claritin.

## 2022-03-12 NOTE — Telephone Encounter (Signed)
Called pt and left a message to discuss her urgent care visit/ she left without being seen after triage

## 2022-03-12 NOTE — ED Notes (Signed)
Attempted to call a second time to check with patient and see if she would like to be seen. Phone rang and then went to a dial tone again. Could not leave a message.

## 2022-03-19 ENCOUNTER — Ambulatory Visit
Admission: EM | Admit: 2022-03-19 | Discharge: 2022-03-19 | Disposition: A | Discharge status: Home / Self Care | Payer: PPO

## 2022-03-19 ENCOUNTER — Encounter: Payer: Self-pay | Admitting: Emergency Medicine

## 2022-03-19 DIAGNOSIS — J370 Chronic laryngitis: Secondary | ICD-10-CM | POA: Diagnosis not present

## 2022-03-19 NOTE — Discharge Instructions (Addendum)
You may have Congestion of larynx. We encourage conservative treatment with symptom relief. Recommend Mucinex liquid as directed.  Your cough can be soothed with a cough suppressant.  Discussed sleeping and narrow airways. Consider sleep study for evaluation

## 2022-03-19 NOTE — ED Triage Notes (Signed)
Pt c/o cough, shortness of breath, and post nasal drip. Started about 2 months ago. She has not tried anything for her symptoms. She was here last week for this and had a cxr but was not seen by a provider.

## 2022-03-19 NOTE — ED Provider Notes (Signed)
[facility]    CSN: [id number] Arrival date & time: 03/19/22  1518      History   Chief Complaint Chief Complaint  Patient presents with   Cough    HPI Rochelle is a 71 y.o. female.   HPI  She is complaining of throat mucous and cough at night. She denies fever, chills, nasal congestion, sneezing, runny nose, new sore throat, new loss of smell or taste, shortness of breath, chest pain, nausea, or diarrhea. This has been going on for several days. Denies exposure to COVID/Influenza/Strep. The current treatment has been OTC Claritin. She endorses that she has been referred to cardiology for further evaluation. She has been told by her children that she snores  Past Medical History:  Diagnosis Date   Arthritis    right knee   Carotid artery occlusion    Dizziness    GERD (gastroesophageal reflux disease)    Heart murmur    History of colon polyps    Hyperlipidemia    Hypertension    Iron deficiency    Stroke ([place]) 12-31-12   no residual effects    Patient Active Problem List   Diagnosis Date Noted   Iron deficiency 02/07/2021   Status post total knee replacement using cement, right 12/01/2019   B12 deficiency 09/24/2018   Normocytic anemia 07/20/2018   Primary osteoarthritis of right knee 02/04/2018   Status post total knee replacement using cement, left 02/04/2018   Obesity (BMI 35.0-39.9 without comorbidity) 01/18/2017   Primary osteoarthritis of left knee 01/18/2017   Cerebral vascular insufficiency 06/06/2015   Dyslipidemia 06/06/2015   Aftercare following surgery of the circulatory system, NEC 08/03/2013   Occlusion and stenosis of carotid artery without mention of cerebral infarction 01/16/2013   Pre-operative cardiovascular examination 01/16/2013   Carotid artery stenosis, symptomatic 01/02/2013   Occlusion and stenosis of unspecified carotid artery 01/02/2013   CVA (cerebral infarction) 12/31/2012   HTN (hypertension) 12/31/2012    GERD (gastroesophageal reflux disease) 12/31/2012   Cerebral artery occlusion with cerebral infarction ([place]) 12/31/2012   Cerebral infarction ([place]) 12/31/2012   Esophageal reflux 12/31/2012   Essential hypertension 12/31/2012    Past Surgical History:  Procedure Laterality Date   CAROTID ENDARTERECTOMY     RIGHT  01/19/13   COLONOSCOPY  08-18-12   COLONOSCOPY WITH PROPOFOL N/A 03/10/2018   Procedure: COLONOSCOPY WITH PROPOFOL;  Surgeon: Lollie Sails, MD;  [facility] END[facility]COPY;  Service: Endoscopy;  Laterality: N/A;   DILATION AND CURETTAGE OF UTERUS     ENDARTERECTOMY Right 01/19/2013   Procedure: ENDARTERECTOMY CAROTID with patch angioplasty;  Surgeon: Conrad Cullman, MD;  Location: [place];  Service: Vascular;  Laterality: Right;   ESOPHAGEAL DILATION     JOINT REPLACEMENT Left 04/15/2017   taotal knee arthorplasty Right 2021   TOTAL KNEE ARTHROPLASTY Right 12/01/2019   Procedure: TOTAL KNEE ARTHROPLASTY - RNFA;  Surgeon: Corky Mull, MD;  Location: [facility] ORS;  Service: Orthopedics;  Laterality: Right;   TUBAL LIGATION     UPPER GI ENDOSCOPY  08-18-12    OB History   No obstetric history on file.      Home Medications    Prior to Admission medications   Medication Sig Start Date End Date Taking? Authorizing Provider  alendronate (FOSAMAX) 70 MG tablet TAKE 1 TABLET(70 MG) BY MOUTH 1 TIME A WEEK WITH A FULL GLASS OF WATER AND ON AN EMPTY STOMACH 12/28/21  Yes Glean Hess, MD  Calcium Carbonate-Vitamin  D3 600-400 MG-UNIT TABS Take 1 tablet by mouth daily.   Yes [provider]  clopidogrel (PLAVIX) 75 MG tablet TAKE 1 TABLET BY MOUTH DAILY 01/18/22  Yes Juline Patch, MD  famotidine (PEPCID) 20 MG tablet TAKE 1 TABLET(20 MG) BY MOUTH TWICE DAILY 01/18/22  Yes Juline Patch, MD  ibuprofen (ADVIL) 200 MG tablet Take by mouth.   Yes [provider]  Iron-Vitamin C 65-125 MG TABS Take 1 tablet by mouth 2 (two) times daily. 07/21/18  Yes Karen Kitchens, NP   loratadine (CLARITIN) 10 MG tablet Take 1 tablet (10 mg total) by mouth continuous as needed for allergies. 03/31/18  Yes Juline Patch, MD  losartan (COZAAR) 50 MG tablet Take 1 tablet (50 mg total) by mouth daily. 01/18/22  Yes Juline Patch, MD  meclizine (ANTIVERT) 25 MG tablet Take 1 tablet (25 mg total) by mouth 3 (three) times daily as needed for dizziness. 01/23/21  Yes Juline Patch, MD  Multiple Vitamins-Minerals (CENTRUM SILVER ULTRA WOMENS) TABS Take 1 tablet by mouth daily.   Yes [provider]  simvastatin (ZOCOR) 20 MG tablet TAKE 1 TABLET(20 MG) BY MOUTH DAILY 01/18/22  Yes Juline Patch, MD  vitamin B-12 (CYANOCOBALAMIN) 1000 MCG tablet Take 1,000 mcg by mouth once a week.   Yes [provider]    Family History Family History  Problem Relation Age of Onset   Stroke Mother    Deep vein thrombosis Mother    Heart disease Mother        Amputation-   Hypertension Mother    Alzheimer's disease Mother    Heart failure Father    Heart disease Father        CHF   Hypertension Father    Varicose Veins Father    Parkinson's disease Father    Breast cancer Paternal Aunt 25    Social History Social History   Tobacco Use   Smoking status: Never   Smokeless tobacco: Never   Tobacco comments:    Smoking cessation materials not required  Vaping Use   Vaping Use: Never used  Substance Use Topics   Alcohol use: No    Alcohol/week: 0.0 standard drinks of alcohol   Drug use: No     Allergies   Adhesive [tape]   Review of Systems Review of Systems   Physical Exam Triage Vital Signs ED Triage Vitals  Enc Vitals Group     BP 03/19/22 1542 (!) 152/88     Pulse Rate 03/19/22 1542 96     Resp 03/19/22 1542 18     Temp 03/19/22 1542 (!) 89.4 F (31.9 C)     Temp Source 03/19/22 1542 Oral     SpO2 03/19/22 1542 97 %     Weight 03/19/22 1541 210 lb 15.7 oz (95.7 kg)     Height 03/19/22 1541 '5\' 4"'$  (1.626 m)     Head Circumference --       Peak Flow --      Pain Score 03/19/22 1539 0     Pain Loc --      Pain Edu? --      Excl. in [place]? --    No data found.  Updated Vital Signs BP (!) 152/88 (BP Location: Left Arm)   Pulse 96   Temp (!) 89.4 F (31.9 C) (Oral)   Resp 18   Ht '5\' 4"'$  (1.626 m)   Wt 210 lb 15.7 oz (95.7 kg)  SpO2 97%   BMI 36.21 kg/m   Visual Acuity Right Eye Distance:   Left Eye Distance:   Bilateral Distance:    Right Eye Near:   Left Eye Near:    Bilateral Near:     Physical Exam Constitutional:      General: She is not in acute distress.    Appearance: She is obese. She is not ill-appearing, toxic-appearing or diaphoretic.  HENT:     Head: Normocephalic and atraumatic.     Right Ear: Tympanic membrane normal.     Left Ear: Tympanic membrane normal.     Nose: Nose normal.     Mouth/Throat:     Mouth: Mucous membranes are moist.     Comments: Narrow airway  Cardiovascular:     Rate and Rhythm: Normal rate and regular rhythm.     Pulses: Normal pulses.     Heart sounds: Normal heart sounds.  Pulmonary:     Effort: Pulmonary effort is normal.     Breath sounds: Normal breath sounds.  Musculoskeletal:     Cervical back: Normal range of motion.  Neurological:     Mental Status: She is alert.      UC Treatments / Results  Labs (all labs ordered are listed, but only abnormal results are displayed) Labs Reviewed - No data to display  EKG   Radiology No results found.  Procedures Procedures (including critical care time)  Medications Ordered in UC Medications - No data to display  Initial Impression / Assessment and Plan / UC Course  I have reviewed the triage vital signs and the nursing notes.  Pertinent labs & imaging results that were available during my care of the patient were reviewed by me and considered in my medical decision making (see chart for details).     Congestion Final Clinical Impressions(s) / UC Diagnoses   Final diagnoses:  Congestion of  larynx     Discharge Instructions      You may have Congestion of larynx. We encourage conservative treatment with symptom relief. Recommend Mucinex liquid as directed.  Your cough can be soothed with a cough suppressant.  Discussed sleeping and narrow airways. Consider sleep study for evaluation      ED Prescriptions   None    PDMP not reviewed this encounter.   Dionisio David [place], [place] 03/19/22 1718

## 2022-03-22 ENCOUNTER — Other Ambulatory Visit: Payer: Self-pay | Admitting: Family Medicine

## 2022-03-22 ENCOUNTER — Ambulatory Visit: Payer: Self-pay | Admitting: *Deleted

## 2022-03-22 DIAGNOSIS — M816 Localized osteoporosis [Lequesne]: Secondary | ICD-10-CM

## 2022-03-22 NOTE — Telephone Encounter (Signed)
Medication Refill - Medication:  alendronate (FOSAMAX) 70 MG tablet  Has the patient contacted their pharmacy? No.  Preferred Pharmacy (with phone number or street name):  [id number] - [place], [place] [address] AT [place] Phone:  [phone]  Fax:  [phone]     Has the patient been seen for an appointment in the last year OR does the patient have an upcoming appointment? Yes.    Agent: Please be advised that RX refills may take up to 3 business days. We ask that you follow-up with your pharmacy.

## 2022-03-22 NOTE — Telephone Encounter (Signed)
Requested medication (s) are due for refill today:For review  Requested medication (s) are on the active medication list: yes    Last refill: 12/28/21  #12  1 refill  Future visit scheduled Yes 07/23/22  Notes to clinic:Failed due to labs, please review.   Requested Prescriptions  Pending Prescriptions Disp Refills   alendronate (FOSAMAX) 70 MG tablet 12 tablet 1    Sig: TAKE 1 TABLET(70 MG) BY MOUTH 1 TIME A WEEK WITH A FULL GLASS OF WATER AND ON AN EMPTY STOMACH     Endocrinology:  Bisphosphonates Failed - 03/22/2022 11:13 AM      Failed - Vitamin D in normal range and within 360 days    No results found for: "BO1751WC5", "EN2778EU2", "VD125OH2TOT", "25OHVITD3", "25OHVITD2", "25OHVITD1", "VD25OH"       Failed - Mg Level in normal range and within 360 days    No results found for: "MG"       Failed - Phosphate in normal range and within 360 days    Phosphorus  Date Value Ref Range Status  12/10/2018 4.3 3.0 - 4.3 mg/dL Final         Passed - Ca in normal range and within 360 days    Calcium  Date Value Ref Range Status  02/02/2022 9.4 8.9 - 10.3 mg/dL Final   Calcium, Total  Date Value Ref Range Status  12/31/2012 9.1 8.5 - 10.1 mg/dL Final         Passed - Cr in normal range and within 360 days    Creatinine  Date Value Ref Range Status  12/31/2012 0.79 0.60 - 1.30 mg/dL Final   Creatinine, Ser  Date Value Ref Range Status  02/02/2022 0.82 0.44 - 1.00 mg/dL Final         Passed - eGFR is 30 or above and within 360 days    EGFR (African American)  Date Value Ref Range Status  12/31/2012 >60  Final   GFR calc Af Amer  Date Value Ref Range Status  07/19/2020 102 >59 mL/min/1.73 Final    Comment:    **In accordance with recommendations from the NKF-ASN Task force,**   [facility] is in the process of updating its eGFR calculation to the   2021 CKD-EPI creatinine equation that estimates kidney function   without a race variable.    EGFR (Non-African Amer.)  Date  Value Ref Range Status  12/31/2012 >60  Final    Comment:    eGFR values <33m/min/1.73 m2 may be an indication of chronic kidney disease (CKD). Calculated eGFR is useful in patients with stable renal function. The eGFR calculation will not be reliable in acutely ill patients when serum creatinine is changing rapidly. It is not useful in  patients on dialysis. The eGFR calculation may not be applicable to patients at the low and high extremes of body sizes, pregnant women, and vegetarians.    GFR, Estimated  Date Value Ref Range Status  02/02/2022 >60 >60 mL/min Final    Comment:    (NOTE) Calculated using the CKD-EPI Creatinine Equation (2021)          Passed - Valid encounter within last 12 months    Recent Outpatient Visits           2 months ago Cerebral vascular insufficiency   [place] [facility] [place]   8 months ago Essential hypertension   [facility]  [place], MD   1 year ago Essential hypertension   [place] [facility] at [place], Deanna C, MD   1 year ago Essential hypertension   [place] [facility] at [place], Deanna C, MD   2 years ago Essential hypertension   [place] [facility] at [place], [place], MD       Future Appointments             In 4 months Juline Patch, MD [facility] Care and Sports Medicine at [facility], [place] or [place] Scan completed in the last 2 years

## 2022-03-22 NOTE — Telephone Encounter (Signed)
  Chief Complaint: See other encounter. Denies SOB Symptoms:  Frequency:  Pertinent Negatives: Patient denies  Disposition: '[]'$ ED /'[]'$ Urgent Care (no appt availability in office) / '[]'$ Appointment(In office/virtual)/ '[]'$  [place] [facility]/ '[]'$ Home Care/ '[]'$ Refused Recommended Disposition /'[]'$ [place] [facility]/ '[]'$  Follow-up with PCP Additional Notes:  Reason for Disposition  [1] Caller has URGENT medicine question about med that PCP or specialist prescribed AND [2] triager unable to answer question    Not Urgent  Answer Assessment - Initial Assessment Questions 1. RESPIRATORY STATUS: "Describe your breathing?" (e.g., wheezing, shortness of breath, unable to speak, severe coughing)      Mild SOB with exertion 2. ONSET: "When did this breathing problem begin?"       3. PATTERN "Does the difficult breathing come and go, or has it been constant since it started?"      *No Answer* 4. SEVERITY: "How bad is your breathing?" (e.g., mild, moderate, severe)    - MILD: No SOB at rest, mild SOB with walking, speaks normally in sentences, can lie down, no retractions, pulse < 100.    - MODERATE: SOB at rest, SOB with minimal exertion and prefers to sit, cannot lie down flat, speaks in phrases, mild retractions, audible wheezing, pulse 100-120.    - SEVERE: Very SOB at rest, speaks in single words, struggling to breathe, sitting hunched forward, retractions, pulse > 120       5. RECURRENT SYMPTOM: "Have you had difficulty breathing before?" If Yes, ask: "When was the last time?" and "What happened that time?"       6. CARDIAC HISTORY: "Do you have any history of heart disease?" (e.g., heart attack, angina, bypass surgery, angioplasty)       7. LUNG HISTORY: "Do you have any history of lung disease?"  (e.g., pulmonary embolus, asthma, emphysema)      8. CAUSE: "What do you think is causing the breathing problem?"       9. OTHER SYMPTOMS: "Do you have any other symptoms? (e.g., dizziness, runny  nose, cough, chest pain, fever)     Cough, mucous 10. O2 SATURATION MONITOR:  "Do you use an oxygen saturation monitor (pulse oximeter) at home?" If Yes, ask: "What is your reading (oxygen level) today?" "What is your usual oxygen saturation reading?" (e.g., 95%)      NA  Answer Assessment - Initial Assessment Questions 1. NAME of MEDICINE: "What medicine(s) are you calling about?"    Mucinex 2. QUESTION: "What is your question?" (e.g., double dose of medicine, side effect)     Ok to take with high blood pressure  Protocols used: Breathing Difficulty-A-AH, Medication Question Call-A-AH

## 2022-03-22 NOTE — Telephone Encounter (Signed)
  Chief Complaint: Medication question Symptoms: Pt seen in UC 03/19/22, "Larynx Congestion" Advised mucinex. Pt questioning if OK to tae with high blood pressure. States does not read DM. States went to pharmacist who advised Coricidin, "But that doesn't mention mucous." Questioning what Dr. Ronnald Ramp would recommend.  Frequency: NA Pertinent Negatives: Patient denies worsening symptoms. Disposition: '[]'$ ED /'[]'$ Urgent Care (no appt availability in office) / '[]'$ Appointment(In office/virtual)/ '[]'$  [place] [facility]/ '[]'$ Home Care/ '[]'$ Refused Recommended Disposition /'[]'$ [place] [facility]/ '[x]'$  Follow-up with PCP Additional Notes: Please advise. Thank you.

## 2022-03-23 MED ORDER — ALENDRONATE SODIUM 70 MG PO TABS: ORAL_TABLET | ORAL | 1 refills | Status: DC

## 2022-03-26 DIAGNOSIS — I10 Essential (primary) hypertension: Secondary | ICD-10-CM | POA: Diagnosis not present

## 2022-03-26 DIAGNOSIS — I6350 Cerebral infarction due to unspecified occlusion or stenosis of unspecified cerebral artery: Secondary | ICD-10-CM | POA: Diagnosis not present

## 2022-03-26 DIAGNOSIS — I6529 Occlusion and stenosis of unspecified carotid artery: Secondary | ICD-10-CM | POA: Diagnosis not present

## 2022-03-26 DIAGNOSIS — E785 Hyperlipidemia, unspecified: Secondary | ICD-10-CM | POA: Diagnosis not present

## 2022-03-26 DIAGNOSIS — I2089 Other forms of angina pectoris: Secondary | ICD-10-CM | POA: Diagnosis not present

## 2022-03-26 DIAGNOSIS — I255 Ischemic cardiomyopathy: Secondary | ICD-10-CM | POA: Diagnosis not present

## 2022-03-26 DIAGNOSIS — E669 Obesity, unspecified: Secondary | ICD-10-CM | POA: Diagnosis not present

## 2022-03-26 DIAGNOSIS — R0602 Shortness of breath: Secondary | ICD-10-CM | POA: Diagnosis not present

## 2022-03-28 DIAGNOSIS — H2511 Age-related nuclear cataract, right eye: Secondary | ICD-10-CM | POA: Diagnosis not present

## 2022-04-09 ENCOUNTER — Ambulatory Visit (INDEPENDENT_AMBULATORY_CARE_PROVIDER_SITE_OTHER): Payer: PPO | Admitting: Family Medicine

## 2022-04-09 ENCOUNTER — Encounter: Payer: Self-pay | Admitting: Family Medicine

## 2022-04-09 VITALS — BP 128/68 | HR 102 | Temp 98.0°F | Ht 64.0 in | Wt 213.0 lb

## 2022-04-09 DIAGNOSIS — R051 Acute cough: Secondary | ICD-10-CM

## 2022-04-09 DIAGNOSIS — J0100 Acute maxillary sinusitis, unspecified: Secondary | ICD-10-CM

## 2022-04-09 MED ORDER — AZITHROMYCIN 250 MG PO TABS: ORAL_TABLET | ORAL | 0 refills | Status: AC

## 2022-04-09 NOTE — Progress Notes (Signed)
Date:  04/09/2022   Name:  Ellen Baker   DOB:  05-12-1951   MRN:  [id number]   Chief Complaint: No chief complaint on file.  Sinusitis This is a new problem. The current episode started 1 to 4 weeks ago (3 weeks). The problem is unchanged. There has been no fever. Associated symptoms include congestion, coughing, diaphoresis, shortness of breath, sinus pressure and a sore throat. Pertinent negatives include no chills, ear pain, headaches or neck pain. Treatments tried: mucinex. The treatment provided moderate relief.    Lab Results  Component Value Date   NA 140 02/02/2022   K 3.7 02/02/2022   CO2 31 02/02/2022   GLUCOSE 120 (H) 02/02/2022   BUN 12 02/02/2022   CREATININE 0.82 02/02/2022   CALCIUM 9.4 02/02/2022   GFRNONAA >60 02/02/2022   Lab Results  Component Value Date   CHOL 172 02/02/2022   HDL 60 02/02/2022   LDLCALC 87 02/02/2022   TRIG 125 02/02/2022   CHOLHDL 2.9 02/02/2022   Lab Results  Component Value Date   TSH 1.757 07/21/2018   Lab Results  Component Value Date   HGBA1C 5.3 01/01/2013   Lab Results  Component Value Date   WBC 6.7 02/02/2022   HGB 13.6 02/02/2022   HCT 42.0 02/02/2022   MCV 94.6 02/02/2022   PLT 254 02/02/2022   Lab Results  Component Value Date   ALT 12 02/02/2022   AST 21 02/02/2022   ALKPHOS 70 02/02/2022   BILITOT 0.9 02/02/2022   No results found for: "25OHVITD2", "25OHVITD3", "VD25OH"   Review of Systems  Constitutional:  Positive for diaphoresis. Negative for chills and fever.  HENT:  Positive for congestion, sinus pressure and sore throat. Negative for drooling, ear discharge and ear pain.   Respiratory:  Positive for cough and shortness of breath. Negative for wheezing.   Cardiovascular:  Negative for chest pain, palpitations and leg swelling.  Gastrointestinal:  Negative for abdominal pain, blood in stool, constipation, diarrhea and nausea.  Endocrine: Negative for polydipsia.  Genitourinary:  Negative  for dysuria, frequency, hematuria and urgency.  Musculoskeletal:  Negative for back pain, myalgias and neck pain.  Skin:  Negative for rash.  Allergic/Immunologic: Negative for environmental allergies.  Neurological:  Negative for dizziness and headaches.  Hematological:  Does not bruise/bleed easily.  Psychiatric/Behavioral:  Negative for suicidal ideas. The patient is not nervous/anxious.     Patient Active Problem List   Diagnosis Date Noted   Iron deficiency 02/07/2021   Status post total knee replacement using cement, right 12/01/2019   B12 deficiency 09/24/2018   Normocytic anemia 07/20/2018   Primary osteoarthritis of right knee 02/04/2018   Status post total knee replacement using cement, left 02/04/2018   Obesity (BMI 35.0-39.9 without comorbidity) 01/18/2017   Primary osteoarthritis of left knee 01/18/2017   Cerebral vascular insufficiency 06/06/2015   Dyslipidemia 06/06/2015   Aftercare following surgery of the circulatory system, NEC 08/03/2013   Occlusion and stenosis of carotid artery without mention of cerebral infarction 01/16/2013   Pre-operative cardiovascular examination 01/16/2013   Carotid artery stenosis, symptomatic 01/02/2013   Occlusion and stenosis of unspecified carotid artery 01/02/2013   CVA (cerebral infarction) 12/31/2012   HTN (hypertension) 12/31/2012   GERD (gastroesophageal reflux disease) 12/31/2012   Cerebral artery occlusion with cerebral infarction ([place]) 12/31/2012   Cerebral infarction ([place]) 12/31/2012   Esophageal reflux 12/31/2012   Essential hypertension 12/31/2012    Allergies  Allergen Reactions   Adhesive [Tape] Rash  Skin became RAW    Past Surgical History:  Procedure Laterality Date   CAROTID ENDARTERECTOMY     RIGHT  01/19/13   COLONOSCOPY  08-18-12   COLONOSCOPY WITH PROPOFOL N/A 03/10/2018   Procedure: COLONOSCOPY WITH PROPOFOL;  Surgeon: Lollie Sails, MD;  Location: [facility] END[facility]COPY;  Service: Endoscopy;   Laterality: N/A;   DILATION AND CURETTAGE OF UTERUS     ENDARTERECTOMY Right 01/19/2013   Procedure: ENDARTERECTOMY CAROTID with patch angioplasty;  Surgeon: Conrad Offerle, MD;  Location: [facility];  Service: Vascular;  Laterality: Right;   ESOPHAGEAL DILATION     JOINT REPLACEMENT Left 04/15/2017   taotal knee arthorplasty Right 2021   TOTAL KNEE ARTHROPLASTY Right 12/01/2019   Procedure: TOTAL KNEE ARTHROPLASTY - RNFA;  Surgeon: Corky Mull, MD;  Location: [facility];  Service: Orthopedics;  Laterality: Right;   TUBAL LIGATION     UPPER GI ENDOSCOPY  08-18-12    Social History   Tobacco Use   Smoking status: Never   Smokeless tobacco: Never   Tobacco comments:    Smoking cessation materials not required  Vaping Use   Vaping Use: Never used  Substance Use Topics   Alcohol use: No    Alcohol/week: 0.0 standard drinks of alcohol   Drug use: No     Medication list has been reviewed and updated.  No outpatient medications have been marked as taking for the 04/09/22 encounter (Appointment) with Juline Patch, MD.       01/18/2022   10:13 AM 07/21/2021    9:10 AM 01/17/2021   10:13 AM 01/01/2020   10:36 AM  GAD 7 : Generalized Anxiety Score  Nervous, Anxious, on Edge 0 0 0 0  Control/stop worrying 0 1 0 0  Worry too much - different things 0 0 0 0  Trouble relaxing 0 0 0 0  Restless 0 0 0 0  Easily annoyed or irritable 0 0 0 0  Afraid - awful might happen 0 0 0 0  Total GAD 7 Score 0 1 0 0  Anxiety Difficulty Not difficult at all Not difficult at all         01/18/2022   10:13 AM 07/21/2021    9:10 AM 06/05/2021   11:25 AM  Depression screen PHQ 2/9  Decreased Interest 0 0 0  Down, Depressed, Hopeless 0 0 0  PHQ - 2 Score 0 0 0  Altered sleeping 2 0   Tired, decreased energy 2 0   Change in appetite 0 0   Feeling bad or failure about yourself  0 0   Trouble concentrating 0 0   Moving slowly or fidgety/restless 0 0   Suicidal thoughts 0 0   PHQ-9 Score 4 0   Difficult  doing work/chores Not difficult at all Not difficult at all     BP Readings from Last 3 Encounters:  03/19/22 (!) 152/88  03/12/22 128/79  02/06/22 (!) 159/81    Physical Exam Vitals and nursing note reviewed. Exam conducted with a chaperone present.  Constitutional:      General: She is not in acute distress.    Appearance: She is not diaphoretic.  HENT:     Head: Normocephalic and atraumatic.     Right Ear: External ear normal.     Left Ear: External ear normal.     Nose: Nose normal.     Mouth/Throat:     Mouth: Mucous membranes are moist.  Eyes:  General:        Right eye: No discharge.        Left eye: No discharge.     Conjunctiva/sclera: Conjunctivae normal.     Pupils: Pupils are equal, round, and reactive to light.  Neck:     Thyroid: No thyromegaly.     Vascular: No JVD.  Cardiovascular:     Rate and Rhythm: Normal rate and regular rhythm.     Heart sounds: Normal heart sounds. No murmur heard.    No friction rub. No gallop.  Pulmonary:     Effort: Pulmonary effort is normal.     Breath sounds: Normal breath sounds.  Abdominal:     General: Bowel sounds are normal.     Palpations: Abdomen is soft. There is no mass.     Tenderness: There is no abdominal tenderness. There is no guarding.  Musculoskeletal:        General: Normal range of motion.     Cervical back: Normal range of motion and neck supple.  Lymphadenopathy:     Cervical: No cervical adenopathy.  Skin:    General: Skin is warm and dry.  Neurological:     Mental Status: She is alert.     Wt Readings from Last 3 Encounters:  03/19/22 210 lb 15.7 oz (95.7 kg)  02/06/22 211 lb (95.7 kg)  01/18/22 210 lb (95.3 kg)    There were no vitals taken for this visit.  Assessment and Plan:  1. Acute maxillary sinusitis, recurrence not specified Acute.  Persistent.  Stable.  This is ongoing and has been persistent for 3 weeks since initial evaluation in urgent care.  This has been treated with  symptomatic relief but not totally resolved on antihistamine/mucolytic cough preparation with mild cough suppression DM.  We will treat because symptoms and physical exam is consistent with a residual sinusitis.  We will call in azithromycin to 50 mg 2 today followed by 1 a day for 4 days. - azithromycin (ZITHROMAX) 250 MG tablet; Take 2 tablets on day 1, then 1 tablet daily on days 2 through 5  Dispense: 6 tablet; Refill: 0  2. Acute cough Patient with acute cough and I have encouraged her to continue Mucinex with DM preparation.   Otilio Miu, MD

## 2022-04-16 ENCOUNTER — Encounter (INDEPENDENT_AMBULATORY_CARE_PROVIDER_SITE_OTHER): Payer: Self-pay

## 2022-04-16 DIAGNOSIS — I2089 Other forms of angina pectoris: Secondary | ICD-10-CM | POA: Diagnosis not present

## 2022-04-16 DIAGNOSIS — R0602 Shortness of breath: Secondary | ICD-10-CM | POA: Diagnosis not present

## 2022-04-18 ENCOUNTER — Encounter: Payer: Self-pay | Admitting: Ophthalmology

## 2022-04-18 ENCOUNTER — Ambulatory Visit: Payer: PPO | Admitting: Anesthesiology

## 2022-04-19 NOTE — Discharge Instructions (Signed)

## 2022-04-23 ENCOUNTER — Encounter
Admission: RE | Disposition: A | Discharge status: Home / Self Care | Payer: Self-pay | Source: Home / Self Care | Attending: Ophthalmology

## 2022-04-23 ENCOUNTER — Encounter: Payer: Self-pay | Admitting: Ophthalmology

## 2022-04-23 ENCOUNTER — Ambulatory Visit
Admission: RE | Admit: 2022-04-23 | Discharge: 2022-04-23 | Disposition: A | Discharge status: Home / Self Care | Payer: PPO | Attending: Ophthalmology | Admitting: Ophthalmology

## 2022-04-23 ENCOUNTER — Other Ambulatory Visit: Payer: Self-pay

## 2022-04-23 DIAGNOSIS — Z5309 Procedure and treatment not carried out because of other contraindication: Secondary | ICD-10-CM | POA: Diagnosis not present

## 2022-04-23 DIAGNOSIS — H269 Unspecified cataract: Secondary | ICD-10-CM | POA: Insufficient documentation

## 2022-04-23 SURGERY — PHACOEMULSIFICATION, CATARACT, WITH IOL INSERTION
Anesthesia: Topical | Laterality: Right

## 2022-04-23 MED ORDER — LACTATED RINGERS IV SOLN: INTRAVENOUS | Status: DC

## 2022-04-23 MED ORDER — TETRACAINE HCL 0.5 % OP SOLN
1.0000 [drp] | OPHTHALMIC | Status: DC | PRN
  Administered 2022-04-23: 1 [drp] via OPHTHALMIC

## 2022-04-23 MED ORDER — ARMC OPHTHALMIC DILATING DROPS: 1.0000 | OPHTHALMIC | Status: DC | PRN

## 2022-04-23 SURGICAL SUPPLY — 9 items
CATARACT SUITE SIGHTPATH (MISCELLANEOUS) ×1 IMPLANT
DISSECTOR HYDRO NUCLEUS 50X22 (MISCELLANEOUS) ×1 IMPLANT
GLOVE SURG GAMMEX PI TX LF 7.5 (GLOVE) ×1 IMPLANT
GLOVE SURG SYN 8.5  E (GLOVE) ×1
GLOVE SURG SYN 8.5 E (GLOVE) ×1 IMPLANT
NEEDLE FILTER BLUNT 18X1 1/2 (NEEDLE) ×1 IMPLANT
SYR 3ML LL SCALE MARK (SYRINGE) ×1 IMPLANT
SYR 5ML LL (SYRINGE) ×1 IMPLANT
WATER STERILE IRR 250ML POUR (IV SOLUTION) ×1 IMPLANT

## 2022-04-23 NOTE — OR Nursing (Signed)
Patient canceled due to A-Fib.

## 2022-04-23 NOTE — H&P (Signed)
Patient complains of cough, and is not able to lay flat.  In preop area attempted to lay the patient flat and at about 45 degree incline patient became uncomfortable or able to breathe.   After discussion of R/B/A, patient elected to defer cataract surgery until after her respiratory system improves and she is able to lay more flat. [place]

## 2022-04-25 ENCOUNTER — Other Ambulatory Visit: Payer: Self-pay | Admitting: Family Medicine

## 2022-04-25 DIAGNOSIS — Z1231 Encounter for screening mammogram for malignant neoplasm of breast: Secondary | ICD-10-CM

## 2022-04-30 DIAGNOSIS — I255 Ischemic cardiomyopathy: Secondary | ICD-10-CM | POA: Diagnosis not present

## 2022-04-30 DIAGNOSIS — E669 Obesity, unspecified: Secondary | ICD-10-CM | POA: Diagnosis not present

## 2022-04-30 DIAGNOSIS — E785 Hyperlipidemia, unspecified: Secondary | ICD-10-CM | POA: Diagnosis not present

## 2022-04-30 DIAGNOSIS — I10 Essential (primary) hypertension: Secondary | ICD-10-CM | POA: Diagnosis not present

## 2022-04-30 DIAGNOSIS — I6350 Cerebral infarction due to unspecified occlusion or stenosis of unspecified cerebral artery: Secondary | ICD-10-CM | POA: Diagnosis not present

## 2022-04-30 DIAGNOSIS — I6529 Occlusion and stenosis of unspecified carotid artery: Secondary | ICD-10-CM | POA: Diagnosis not present

## 2022-04-30 DIAGNOSIS — R0602 Shortness of breath: Secondary | ICD-10-CM | POA: Diagnosis not present

## 2022-04-30 DIAGNOSIS — I2089 Other forms of angina pectoris: Secondary | ICD-10-CM | POA: Diagnosis not present

## 2022-05-03 ENCOUNTER — Other Ambulatory Visit: Payer: PPO

## 2022-05-03 ENCOUNTER — Inpatient Hospital Stay: Payer: PPO | Attending: Oncology

## 2022-05-03 ENCOUNTER — Other Ambulatory Visit: Payer: Self-pay

## 2022-05-03 ENCOUNTER — Encounter: Payer: Self-pay | Admitting: Nurse Practitioner

## 2022-05-03 ENCOUNTER — Inpatient Hospital Stay (HOSPITAL_BASED_OUTPATIENT_CLINIC_OR_DEPARTMENT_OTHER): Payer: PPO | Admitting: Nurse Practitioner

## 2022-05-03 VITALS — BP 128/88 | HR 57 | Temp 99.3°F | Resp 17

## 2022-05-03 DIAGNOSIS — E538 Deficiency of other specified B group vitamins: Secondary | ICD-10-CM | POA: Insufficient documentation

## 2022-05-03 DIAGNOSIS — Z86718 Personal history of other venous thrombosis and embolism: Secondary | ICD-10-CM | POA: Insufficient documentation

## 2022-05-03 DIAGNOSIS — R011 Cardiac murmur, unspecified: Secondary | ICD-10-CM | POA: Diagnosis not present

## 2022-05-03 DIAGNOSIS — D125 Benign neoplasm of sigmoid colon: Secondary | ICD-10-CM | POA: Diagnosis not present

## 2022-05-03 DIAGNOSIS — D122 Benign neoplasm of ascending colon: Secondary | ICD-10-CM | POA: Diagnosis not present

## 2022-05-03 DIAGNOSIS — Z79899 Other long term (current) drug therapy: Secondary | ICD-10-CM | POA: Diagnosis not present

## 2022-05-03 DIAGNOSIS — D509 Iron deficiency anemia, unspecified: Secondary | ICD-10-CM

## 2022-05-03 DIAGNOSIS — Z7901 Long term (current) use of anticoagulants: Secondary | ICD-10-CM | POA: Diagnosis not present

## 2022-05-03 DIAGNOSIS — D124 Benign neoplasm of descending colon: Secondary | ICD-10-CM | POA: Diagnosis not present

## 2022-05-03 DIAGNOSIS — K219 Gastro-esophageal reflux disease without esophagitis: Secondary | ICD-10-CM | POA: Diagnosis not present

## 2022-05-03 DIAGNOSIS — Z7902 Long term (current) use of antithrombotics/antiplatelets: Secondary | ICD-10-CM | POA: Diagnosis not present

## 2022-05-03 DIAGNOSIS — K5730 Diverticulosis of large intestine without perforation or abscess without bleeding: Secondary | ICD-10-CM | POA: Diagnosis not present

## 2022-05-03 DIAGNOSIS — I509 Heart failure, unspecified: Secondary | ICD-10-CM | POA: Insufficient documentation

## 2022-05-03 DIAGNOSIS — I10 Essential (primary) hypertension: Secondary | ICD-10-CM | POA: Diagnosis not present

## 2022-05-03 DIAGNOSIS — I4891 Unspecified atrial fibrillation: Secondary | ICD-10-CM | POA: Diagnosis not present

## 2022-05-03 DIAGNOSIS — I110 Hypertensive heart disease with heart failure: Secondary | ICD-10-CM | POA: Diagnosis not present

## 2022-05-03 DIAGNOSIS — D649 Anemia, unspecified: Secondary | ICD-10-CM

## 2022-05-03 LAB — COMPREHENSIVE METABOLIC PANEL
ALT: 20 U/L (ref 0–44)
AST: 30 U/L (ref 15–41)
Albumin: 3.9 g/dL (ref 3.5–5.0)
Alkaline Phosphatase: 59 U/L (ref 38–126)
Anion gap: 11 (ref 5–15)
BUN: 9 mg/dL (ref 8–23)
CO2: 28 mmol/L (ref 22–32)
Calcium: 8.6 mg/dL — ABNORMAL LOW (ref 8.9–10.3)
Chloride: 98 mmol/L (ref 98–111)
Creatinine, Ser: 0.89 mg/dL (ref 0.44–1.00)
GFR, Estimated: >60 mL/min (ref >60)
Glucose, Bld: 107 mg/dL — ABNORMAL HIGH (ref 70–99)
Potassium: 3.7 mmol/L (ref 3.5–5.1)
Sodium: 137 mmol/L (ref 135–145)
Total Bilirubin: 1.5 mg/dL — ABNORMAL HIGH (ref 0.3–1.2)
Total Protein: 7.3 g/dL (ref 6.5–8.1)

## 2022-05-03 LAB — IRON AND TIBC
Iron: 42 ug/dL (ref 28–170)
Saturation Ratios: 8 % — ABNORMAL LOW (ref 10.4–31.8)
TIBC: 500 ug/dL — ABNORMAL HIGH (ref 250–450)
UIBC: 458 ug/dL

## 2022-05-03 LAB — CBC WITH DIFFERENTIAL/PLATELET
Abs Immature Granulocytes: 0.01 10*3/uL (ref 0.00–0.07)
Basophils Absolute: 0 10*3/uL (ref 0.0–0.1)
Basophils Relative: 0 %
Eosinophils Absolute: 0 10*3/uL (ref 0.0–0.5)
Eosinophils Relative: 1 %
HCT: 39.1 % (ref 36.0–46.0)
Hemoglobin: 12.4 g/dL (ref 12.0–15.0)
Immature Granulocytes: 0 %
Lymphocytes Relative: 6 %
Lymphs Abs: 0.3 10*3/uL — ABNORMAL LOW (ref 0.7–4.0)
MCH: 29.8 pg (ref 26.0–34.0)
MCHC: 31.7 g/dL (ref 30.0–36.0)
MCV: 94 fL (ref 80.0–100.0)
Monocytes Absolute: 0.7 10*3/uL (ref 0.1–1.0)
Monocytes Relative: 13 %
Neutro Abs: 4.3 10*3/uL (ref 1.7–7.7)
Neutrophils Relative %: 80 %
Platelets: 180 10*3/uL (ref 150–400)
RBC: 4.16 MIL/uL (ref 3.87–5.11)
RDW: 13.8 % (ref 11.5–15.5)
WBC: 5.3 10*3/uL (ref 4.0–10.5)
nRBC: 0 % (ref 0.0–0.2)

## 2022-05-03 LAB — LIPID PANEL
Cholesterol: 123 mg/dL (ref 0–200)
HDL: 49 mg/dL (ref >40)
LDL Cholesterol: 63 mg/dL (ref 0–99)
Total CHOL/HDL Ratio: 2.5 RATIO
Triglycerides: 53 mg/dL (ref <150)
VLDL: 11 mg/dL (ref 0–40)

## 2022-05-03 LAB — FERRITIN: Ferritin: 33 ng/mL (ref 11–307)

## 2022-05-03 LAB — VITAMIN B12: Vitamin B-12: 1215 pg/mL — ABNORMAL HIGH (ref 180–914)

## 2022-05-03 NOTE — Progress Notes (Signed)
Patient here for oncology follow-up appointment,  concerns of dry mouth

## 2022-05-03 NOTE — Progress Notes (Signed)
[place] NOTE  Patient Care Team: Juline Patch, MD as PCP - General (Family Medicine) Reche Dixon, PA-C (Orthopedic Surgery) Juline Patch, MD (Family Medicine)  CHIEF COMPLAINTS/PURPOSE OF CONSULTATION:  Iron/B12 deficiency  #  Oncology History   No history exists.   HISTORY OF PRESENTING ILLNESS: Alone.  Ambulating independently. [employer] Hubert 71 y.o. female with history of iron deficiency and b12 deficiency who presents to clinic for follow up. In interim, she's been diagnosed with a fib and chf. Is being managed by Dr Clayborn Bigness. She's concerned because her mother had a fib and alzheimers, her father had chf and parkinsons. She has black stools with oral iron. Unchanged. She has mild fatigue but feels well and denies other complaints.   Review of Systems  Constitutional:  Negative for chills, diaphoresis, fever, malaise/fatigue and weight loss.  HENT:  Negative for nosebleeds and sore throat.   Eyes:  Negative for double vision.  Respiratory:  Negative for cough, hemoptysis, sputum production, shortness of breath and wheezing.   Cardiovascular:  Negative for chest pain, palpitations, orthopnea and leg swelling.  Gastrointestinal:  Negative for abdominal pain, blood in stool, constipation, diarrhea, heartburn, melena, nausea and vomiting.  Genitourinary:  Negative for dysuria, frequency and urgency.  Musculoskeletal:  Negative for back pain and joint pain.  Skin: Negative.  Negative for itching and rash.  Neurological:  Negative for dizziness, tingling, focal weakness, weakness and headaches.  Endo/Heme/Allergies:  Does not bruise/bleed easily.  Psychiatric/Behavioral:  Negative for depression. The patient is not nervous/anxious and does not have insomnia.     MEDICAL HISTORY:  Past Medical History:  Diagnosis Date   Arthritis    right knee   Atrial fibrillation (HCC)    Carotid artery occlusion    Dizziness    GERD (gastroesophageal  reflux disease)    Heart murmur    History of colon polyps    Hyperlipidemia    Hypertension    Iron deficiency    Stroke ([place]) 12/31/2012   no residual effects    SURGICAL HISTORY: Past Surgical History:  Procedure Laterality Date   CAROTID ENDARTERECTOMY     RIGHT  01/19/13   COLONOSCOPY  08-18-12   COLONOSCOPY WITH PROPOFOL N/A 03/10/2018   Procedure: COLONOSCOPY WITH PROPOFOL;  Surgeon: Lollie Sails, MD;  Location: [facility];  Service: Endoscopy;  Laterality: N/A;   DILATION AND CURETTAGE OF UTERUS     ENDARTERECTOMY Right 01/19/2013   Procedure: ENDARTERECTOMY CAROTID with patch angioplasty;  Surgeon: Conrad West Winfield, MD;  Location: [place];  Service: Vascular;  Laterality: Right;   ESOPHAGEAL DILATION     JOINT REPLACEMENT Left 04/15/2017   taotal knee arthorplasty Right 2021   TOTAL KNEE ARTHROPLASTY Right 12/01/2019   Procedure: TOTAL KNEE ARTHROPLASTY - RNFA;  Surgeon: Corky Mull, MD;  Location: [facility] ORS;  Service: Orthopedics;  Laterality: Right;   TUBAL LIGATION     UPPER GI ENDOSCOPY  08-18-12    SOCIAL HISTORY: Social History   Socioeconomic History   Marital status: Widowed    Spouse name: Not on file   Number of children: 3   Years of education: Not on file   Highest education level: 12th grade  Occupational History    Employer: [employer]  Tobacco Use   Smoking status: Never   Smokeless tobacco: Never   Tobacco comments:    Smoking cessation materials not required  Vaping Use   Vaping Use: Never used  Substance and  Sexual Activity   Alcohol use: No    Alcohol/week: 0.0 standard drinks of alcohol   Drug use: No   Sexual activity: Never  Other Topics Concern   Not on file  Social History Narrative   Pt lives alone.    Social Determinants of Health   Financial Resource Strain: Low Risk  (06/05/2021)   Overall Financial Resource Strain (CARDIA)    Difficulty of Paying Living Expenses: Not hard at all  Food Insecurity: No Food Insecurity  (06/05/2021)   Hunger Vital Sign    Worried About Running Out of Food in the Last Year: Never true    Ran Out of Food in the Last Year: Never true  Transportation Needs: No Transportation Needs (06/05/2021)   PRAPARE - Hydrologist (Medical): No    Lack of Transportation (Non-Medical): No  Physical Activity: Inactive (06/05/2021)   Exercise Vital Sign    Days of Exercise per Week: 0 days    Minutes of Exercise per Session: 0 min  Stress: No Stress Concern Present (06/05/2021)   [place]    Feeling of Stress : Only a little  Social Connections: Moderately Isolated (06/05/2021)   Social Connection and Isolation Panel [NHANES]    Frequency of Communication with Friends and Family: More than three times a week    Frequency of Social Gatherings with Friends and Family: Three times a week    Attends Religious Services: More than 4 times per year    Active Member of Clubs or Organizations: No    Attends Archivist Meetings: Never    Marital Status: Widowed  Intimate Partner Violence: Not At Risk (06/05/2021)   Humiliation, Afraid, Rape, and Kick questionnaire    Fear of Current or Ex-Partner: No    Emotionally Abused: No    Physically Abused: No    Sexually Abused: No    FAMILY HISTORY: Family History  Problem Relation Age of Onset   Stroke Mother    Deep vein thrombosis Mother    Heart disease Mother        Amputation-   Hypertension Mother    Alzheimer's disease Mother    Heart failure Father    Heart disease Father        CHF   Hypertension Father    Varicose Veins Father    Parkinson's disease Father    Breast cancer Paternal Aunt 90    ALLERGIES:  is allergic to adhesive [tape].  MEDICATIONS:  Current Outpatient Medications  Medication Sig Dispense Refill   alendronate (FOSAMAX) 70 MG tablet TAKE 1 TABLET(70 MG) BY MOUTH 1 TIME A WEEK WITH A FULL GLASS OF WATER  AND ON AN EMPTY STOMACH 12 tablet 1   amiodarone (PACERONE) 200 MG tablet Take by mouth.     apixaban (ELIQUIS) 5 MG TABS tablet Take 5 mg by mouth 2 (two) times daily.     Calcium Carbonate-Vitamin D3 600-400 MG-UNIT TABS Take 1 tablet by mouth daily.     clopidogrel (PLAVIX) 75 MG tablet TAKE 1 TABLET BY MOUTH DAILY 90 tablet 1   DM-APAP-CPM (CORICIDIN HBP MAX STRENGTH FLU PO) Take 30 mLs by mouth daily as needed.     famotidine (PEPCID) 20 MG tablet TAKE 1 TABLET(20 MG) BY MOUTH TWICE DAILY 180 tablet 1   furosemide (LASIX) 20 MG tablet Take by mouth.     ibuprofen (ADVIL) 200 MG tablet Take by mouth.  Iron-Vitamin C 65-125 MG TABS Take 1 tablet by mouth 2 (two) times daily. 60 tablet 1   loratadine (CLARITIN) 10 MG tablet Take 1 tablet (10 mg total) by mouth continuous as needed for allergies. 30 tablet 6   losartan (COZAAR) 50 MG tablet Take 1 tablet (50 mg total) by mouth daily. 90 tablet 1   meclizine (ANTIVERT) 25 MG tablet Take 1 tablet (25 mg total) by mouth 3 (three) times daily as needed for dizziness. 30 tablet 0   metoprolol tartrate (LOPRESSOR) 25 MG tablet Take 25 mg by mouth 2 (two) times daily.     metoprolol tartrate (LOPRESSOR) 50 MG tablet Take 1 tablet by mouth 2 (two) times daily.     Multiple Vitamins-Minerals (CENTRUM SILVER ULTRA WOMENS) TABS Take 1 tablet by mouth daily.     Phenylephrine-APAP-guaiFENesin (MUCINEX FAST-MAX) 10-650-400 MG/20ML LIQD Take 20 mLs by mouth every 4 (four) hours as needed.     simvastatin (ZOCOR) 20 MG tablet TAKE 1 TABLET(20 MG) BY MOUTH DAILY 90 tablet 1   vitamin B-12 (CYANOCOBALAMIN) 1000 MCG tablet Take 1,000 mcg by mouth once a week.     No current facility-administered medications for this visit.   PHYSICAL EXAMINATION: ECOG PERFORMANCE STATUS: 0 - Asymptomatic  Vitals:   05/03/22 1110  BP: 128/88  Pulse: (!) 57  Resp: 17  Temp: 99.3 F (37.4 C)  SpO2: 98%   There were no vitals filed for this visit.   Physical  Exam Constitutional:      Appearance: She is not ill-appearing.  Pulmonary:     Effort: Pulmonary effort is normal.  Musculoskeletal:        General: No deformity.     Right lower leg: No edema.     Left lower leg: No edema.  Skin:    General: Skin is warm and dry.  Neurological:     Mental Status: She is alert and oriented to person, place, and time. Mental status is at baseline.  Psychiatric:        Mood and Affect: Mood normal.        Behavior: Behavior normal.    LABORATORY DATA:  I have reviewed the data as listed Lab Results  Component Value Date   WBC 5.3 05/03/2022   HGB 12.4 05/03/2022   HCT 39.1 05/03/2022   MCV 94.0 05/03/2022   PLT 180 05/03/2022   Recent Labs    08/04/21 1035 02/02/22 1012 05/03/22 1046  NA 140 140 137  K 3.7 3.7 3.7  CL 102 104 98  CO2 '30 31 28  '$ GLUCOSE 105* 120* 107*  BUN '9 12 9  '$ CREATININE 0.70 0.82 0.89  CALCIUM 9.0 9.4 8.6*  GFRNONAA >60 >60 >60  PROT 7.4 7.7 7.3  ALBUMIN 3.7 3.8 3.9  AST '20 21 30  '$ ALT '11 12 20  '$ ALKPHOS 71 70 59  BILITOT 0.4 0.9 1.5*  Iron/TIBC/Ferritin/ %Sat    Component Value Date/Time   IRON 121 02/02/2022 1012   TIBC 463 (H) 02/02/2022 1012   FERRITIN 25 02/02/2022 1012   FERRITIN 17 06/27/2018 0856   IRONPCTSAT 26 02/02/2022 1012    RADIOGRAPHIC STUDIES: I have personally reviewed the radiological images as listed and agreed with the findings in the report. No results found.  ASSESSMENT & PLAN:  Encounter Diagnoses  Name Primary?   Iron deficiency anemia, unspecified iron deficiency anemia type Yes   B12 deficiency    Iron deficiency # Iron deficiency - [ferritin- AUG 2022- 32'  iron sat-18%]. On Plavix and Eliquis for afib and chf. Tolerating vitron-c BID. Tolerating well. Hemoglobin is stable. Asymptomatic. Iron studies pending at time of visit. Hold off on iv iron today.     # ETIOLOGY: EGD on 08/18/2012; Colonoscopy on 03/10/2018 revealed diverticulosis in the sigmoid colon, in the  descending colon, in the transverse colon and in the ascending colon.  There was one 3 mm polyp at the hepatic flexure (tubular adenoma) and two 4 to 6 mm polyps (hyperplastic) in the sigmoid colon.    # B12 deficiency: B12 pill once a week; Feb 2023- WNL. pending today.    DISPOSITION: 3 months- labs (cbc, bmp, ferritin, iron studies, b12), Dr Rogue Bussing- [place]   All questions were answered. The patient knows to call the clinic with any problems, questions or concerns.  Verlon Au, NP 05/03/2022

## 2022-05-07 ENCOUNTER — Ambulatory Visit: Admit: 2022-05-07 | Payer: PPO | Admitting: Ophthalmology

## 2022-05-07 SURGERY — PHACOEMULSIFICATION, CATARACT, WITH IOL INSERTION
Anesthesia: Topical | Laterality: Left

## 2022-05-09 ENCOUNTER — Ambulatory Visit: Payer: PPO | Admitting: Internal Medicine

## 2022-05-25 ENCOUNTER — Ambulatory Visit
Admission: RE | Admit: 2022-05-25 | Discharge: 2022-05-25 | Disposition: A | Discharge status: Home / Self Care | Payer: PPO | Source: Ambulatory Visit | Attending: Family Medicine | Admitting: Family Medicine

## 2022-05-25 DIAGNOSIS — Z1231 Encounter for screening mammogram for malignant neoplasm of breast: Secondary | ICD-10-CM | POA: Diagnosis not present

## 2022-05-28 DIAGNOSIS — J301 Allergic rhinitis due to pollen: Secondary | ICD-10-CM | POA: Diagnosis not present

## 2022-05-28 DIAGNOSIS — K219 Gastro-esophageal reflux disease without esophagitis: Secondary | ICD-10-CM | POA: Diagnosis not present

## 2022-05-28 DIAGNOSIS — J328 Other chronic sinusitis: Secondary | ICD-10-CM | POA: Diagnosis not present

## 2022-05-28 DIAGNOSIS — R0982 Postnasal drip: Secondary | ICD-10-CM | POA: Diagnosis not present

## 2022-05-30 DIAGNOSIS — I2089 Other forms of angina pectoris: Secondary | ICD-10-CM | POA: Diagnosis not present

## 2022-05-30 DIAGNOSIS — E669 Obesity, unspecified: Secondary | ICD-10-CM | POA: Diagnosis not present

## 2022-05-30 DIAGNOSIS — R0602 Shortness of breath: Secondary | ICD-10-CM | POA: Diagnosis not present

## 2022-05-30 DIAGNOSIS — E785 Hyperlipidemia, unspecified: Secondary | ICD-10-CM | POA: Diagnosis not present

## 2022-05-30 DIAGNOSIS — I10 Essential (primary) hypertension: Secondary | ICD-10-CM | POA: Diagnosis not present

## 2022-06-06 ENCOUNTER — Ambulatory Visit (INDEPENDENT_AMBULATORY_CARE_PROVIDER_SITE_OTHER): Payer: PPO

## 2022-06-06 DIAGNOSIS — Z0000 Encounter for general adult medical examination without abnormal findings: Secondary | ICD-10-CM | POA: Diagnosis not present

## 2022-06-06 NOTE — Patient Instructions (Signed)

## 2022-06-06 NOTE — Progress Notes (Signed)
I connected with  [place] on 06/06/22 by a audio enabled telemedicine application and verified that I am speaking with the correct person using two identifiers.  Patient Location: Home  Provider Location: Office/Clinic  I discussed the limitations of evaluation and management by telemedicine. The patient expressed understanding and agreed to proceed.   Subjective:   Ellen Baker is a 71 y.o. female who presents for Medicare Annual (Subsequent) preventive examination.  Review of Systems    Per HPI unless specifically indicated below.  Cardiac Risk Factors include: advanced age (>30mn, >>16women);female gender, hyperlipidemia, and essential hypertension.          Objective:       05/03/2022   11:10 AM 04/23/2022   10:35 AM 04/18/2022   10:24 AM  Vitals with BMI  Height  '5\' 4"'$  '5\' 4"'$   Weight  214 lbs 11 oz 210 lbs  BMI  [phone] Systolic [id number]  Diastolic 88 87   Pulse 57 152     There were no vitals filed for this visit. There is no height or weight on file to calculate BMI.     06/06/2022   11:52 AM 05/03/2022   11:13 AM 03/19/2022    3:42 PM 02/06/2022   10:15 AM 08/08/2021   10:01 AM 06/05/2021   11:26 AM 02/07/2021   10:43 AM  Advanced Directives  Does Patient Have a Medical Advance Directive? No No No No No No No  Would patient like information on creating a medical advance directive? No - Patient declined No - Patient declined   No - Patient declined Yes (MAU/Ambulatory/Procedural Areas - Information given) No - Patient declined    Current Medications (verified) Outpatient Encounter Medications as of 06/06/2022  Medication Sig   alendronate (FOSAMAX) 70 MG tablet TAKE 1 TABLET(70 MG) BY MOUTH 1 TIME A WEEK WITH A FULL GLASS OF WATER AND ON AN EMPTY STOMACH   amiodarone (PACERONE) 200 MG tablet Take by mouth.   amoxicillin-clavulanate (AUGMENTIN) 875-125 MG tablet Take 1 tablet by mouth 2 (two) times daily.   apixaban (ELIQUIS) 5 MG  TABS tablet Take 5 mg by mouth 2 (two) times daily.   Calcium Carbonate-Vitamin D3 600-400 MG-UNIT TABS Take 1 tablet by mouth daily.   clopidogrel (PLAVIX) 75 MG tablet TAKE 1 TABLET BY MOUTH DAILY   DM-APAP-CPM (CORICIDIN HBP MAX STRENGTH FLU PO) Take 30 mLs by mouth daily as needed.   famotidine (PEPCID) 20 MG tablet TAKE 1 TABLET(20 MG) BY MOUTH TWICE DAILY   furosemide (LASIX) 20 MG tablet Take by mouth.   Iron-Vitamin C 65-125 MG TABS Take 1 tablet by mouth 2 (two) times daily.   loratadine (CLARITIN) 10 MG tablet Take 1 tablet (10 mg total) by mouth continuous as needed for allergies.   losartan (COZAAR) 50 MG tablet Take 1 tablet (50 mg total) by mouth daily.   metoprolol tartrate (LOPRESSOR) 25 MG tablet Take 25 mg by mouth 2 (two) times daily.   metoprolol tartrate (LOPRESSOR) 50 MG tablet Take 1 tablet by mouth 2 (two) times daily.   Multiple Vitamins-Minerals (CENTRUM SILVER ULTRA WOMENS) TABS Take 1 tablet by mouth daily.   predniSONE (DELTASONE) 5 MG tablet Take by mouth.   simvastatin (ZOCOR) 20 MG tablet TAKE 1 TABLET(20 MG) BY MOUTH DAILY   vitamin B-12 (CYANOCOBALAMIN) 1000 MCG tablet Take 1,000 mcg by mouth once a week.   ibuprofen (ADVIL) 200 MG tablet Take by mouth. (Patient not taking:  Reported on 06/06/2022)   meclizine (ANTIVERT) 25 MG tablet Take 1 tablet (25 mg total) by mouth 3 (three) times daily as needed for dizziness. (Patient not taking: Reported on 06/06/2022)   [DISCONTINUED] Phenylephrine-APAP-guaiFENesin (MUCINEX FAST-MAX) 10-650-400 MG/20ML LIQD Take 20 mLs by mouth every 4 (four) hours as needed. (Patient not taking: Reported on 06/06/2022)   No facility-administered encounter medications on file as of 06/06/2022.    Allergies (verified) Adhesive [tape]   History: Past Medical History:  Diagnosis Date   Arthritis    right knee   Atrial fibrillation (HCC)    Carotid artery occlusion    Dizziness    GERD (gastroesophageal reflux disease)    Heart  murmur    History of colon polyps    Hyperlipidemia    Hypertension    Iron deficiency    Stroke ([place]) 12/31/2012   no residual effects   Past Surgical History:  Procedure Laterality Date   CAROTID ENDARTERECTOMY     RIGHT  01/19/13   COLONOSCOPY  08-18-12   COLONOSCOPY WITH PROPOFOL N/A 03/10/2018   Procedure: COLONOSCOPY WITH PROPOFOL;  Surgeon: Lollie Sails, MD;  Location: [facility] ENDOSCOPY;  Service: Endoscopy;  Laterality: N/A;   DILATION AND CURETTAGE OF UTERUS     ENDARTERECTOMY Right 01/19/2013   Procedure: ENDARTERECTOMY CAROTID with patch angioplasty;  Surgeon: Conrad Milton, MD;  Location: [facility];  Service: Vascular;  Laterality: Right;   ESOPHAGEAL DILATION     JOINT REPLACEMENT Left 04/15/2017   taotal knee arthorplasty Right 2021   TOTAL KNEE ARTHROPLASTY Right 12/01/2019   Procedure: TOTAL KNEE ARTHROPLASTY - RNFA;  Surgeon: Corky Mull, MD;  Location: [facility] ORS;  Service: Orthopedics;  Laterality: Right;   TUBAL LIGATION     UPPER GI ENDOSCOPY  08-18-12   Family History  Problem Relation Age of Onset   Stroke Mother    Deep vein thrombosis Mother    Heart disease Mother        Amputation-   Hypertension Mother    Alzheimer's disease Mother    Heart failure Father    Heart disease Father        CHF   Hypertension Father    Varicose Veins Father    Parkinson's disease Father    Breast cancer Paternal Aunt 61   Social History   Socioeconomic History   Marital status: Widowed    Spouse name: Not on file   Number of children: 3   Years of education: Not on file   Highest education level: 12th grade  Occupational History   Occupation: retired  Tobacco Use   Smoking status: Never   Smokeless tobacco: Never   Tobacco comments:    Smoking cessation materials not required  Vaping Use   Vaping Use: Never used  Substance and Sexual Activity   Alcohol use: No    Alcohol/week: 0.0 standard drinks of alcohol   Drug use: No   Sexual activity: Never  Other  Topics Concern   Not on file  Social History Narrative   Pt lives alone.    Social Determinants of Health   Financial Resource Strain: Low Risk  (06/06/2022)   Overall Financial Resource Strain (CARDIA)    Difficulty of Paying Living Expenses: Not hard at all  Food Insecurity: No Food Insecurity (06/06/2022)   Hunger Vital Sign    Worried About Running Out of Food in the Last Year: Never true    Ran Out of Food in the Last Year:  Never true  Transportation Needs: No Transportation Needs (06/06/2022)   PRAPARE - Hydrologist (Medical): No    Lack of Transportation (Non-Medical): No  Physical Activity: Inactive (06/06/2022)   Exercise Vital Sign    Days of Exercise per Week: 0 days    Minutes of Exercise per Session: 0 min  Stress: No Stress Concern Present (06/06/2022)   [place]    Feeling of Stress : Only a little  Social Connections: Moderately Integrated (06/06/2022)   Social Connection and Isolation Panel [NHANES]    Frequency of Communication with Friends and Family: More than three times a week    Frequency of Social Gatherings with Friends and Family: More than three times a week    Attends Religious Services: More than 4 times per year    Active Member of [employer] or Organizations: Yes    Attends Archivist Meetings: More than 4 times per year    Marital Status: Widowed    Tobacco Counseling Counseling given: Not Answered Tobacco comments: Smoking cessation materials not required   Clinical Intake:  Pre-visit preparation completed: No  Pain : No/denies pain     Nutritional Status: BMI of 19-24  Normal Nutritional Risks: None Diabetes: No  How often do you need to have someone help you when you read instructions, pamphlets, or other written materials from your doctor or pharmacy?: 1 - Never  Diabetic?No     Information entered by :: Donnie Mesa,  CMA   Activities of Daily Living    06/06/2022   11:39 AM  In your present state of health, do you have any difficulty performing the following activities:  Hearing? 1  Vision? 1  Difficulty concentrating or making decisions? 1  Walking or climbing stairs? 1  Dressing or bathing? 0  Doing errands, shopping? 0    Patient Care Team: Juline Patch, MD as PCP - General (Family Medicine) Reche Dixon, PA-C (Orthopedic Surgery) Juline Patch, MD (Family Medicine)  Indicate any recent Medical Services you may have received from other than [facility] providers in the past year (date may be approximate).    The pt was seen at [facility] on [facility]/2022 for a cough.  Assessment:   This is a routine wellness examination for [place].  Hearing/Vision screen Denies any hearing issues.  [place] any vision issues, Annual Eye Exam at [facility], Dr. Edison Pace.   Dietary issues and exercise activities discussed: Current Exercise Habits: The patient does not participate in regular exercise at present, Exercise limited by: orthopedic condition(s);neurologic condition(s)   Goals Addressed   None    Depression Screen    04/09/2022    4:15 PM 01/18/2022   10:13 AM 07/21/2021    9:10 AM 06/05/2021   11:25 AM 01/17/2021   10:13 AM 07/19/2020   10:36 AM 06/01/2020   10:18 AM  PHQ 2/9 Scores  PHQ - 2 Score 0 0 0 0 0 0 0  PHQ- 9 Score 0 4 0  0 0     Fall Risk    06/06/2022   11:37 AM 04/09/2022    4:15 PM 01/18/2022   10:18 AM 01/18/2022   10:12 AM 06/05/2021   11:29 AM  Fall Risk   Falls in the past year? 0 0 1 0 1  Number falls in past yr: 0 0 1 0 1  Injury with Fall? 0 0 0 0 0  Risk  for fall due to : No Fall Risks No Fall Risks No Fall Risks No Fall Risks History of fall(s)  Follow up Falls evaluation completed Falls evaluation completed Falls evaluation completed Falls evaluation completed Falls prevention discussed    FALL RISK PREVENTION PERTAINING TO THE HOME:  Any stairs  in or around the home? Yes  If so, are there any without handrails? No  Home free of loose throw rugs in walkways, pet beds, electrical cords, etc? No  Adequate lighting in your home to reduce risk of falls? Yes   ASSISTIVE DEVICES UTILIZED TO PREVENT FALLS:  Life alert? No  Use of a cane, walker or w/c? Yes  Grab bars in the bathroom? Yes  Shower chair or bench in shower? Yes  Elevated toilet seat or a handicapped toilet? Yes   TIMED UP AND GO:  Was the test performed?  unable to perform, virtual appt .  Cognitive Function:        06/06/2022   11:40 AM 12/09/2017    8:31 AM  6CIT Screen  What Year? 0 points 0 points  What month? 0 points 0 points  What time? 0 points 0 points  Count back from 20 0 points 0 points  Months in reverse 0 points 0 points  Repeat phrase 2 points 0 points  Total Score 2 points 0 points    Immunizations Immunization History  Administered Date(s) Administered   Fluad Quad(high Dose 65+) 05/05/2019, 04/11/2020   Influenza, High Dose Seasonal PF 03/31/2018, 06/02/2021   Moderna SARS-COV2 Booster Vaccination 04/27/2020   PFIZER(Purple Top)SARS-COV-2 Vaccination 08/11/2019, 09/01/2019, 04/27/2020   Pneumococcal Conjugate-13 06/05/2016   Pneumococcal Polysaccharide-23 12/05/2017   Tdap 07/19/2020    TDAP status: Up to date  Flu Vaccine status: Due, Education has been provided regarding the importance of this vaccine. Advised may receive this vaccine at local pharmacy or Health Dept. Aware to provide a copy of the vaccination record if obtained from local pharmacy or Health Dept. Verbalized acceptance and understanding.  Pneumococcal vaccine status: Up to date  Covid-19 vaccine status: Information provided on how to obtain vaccines.   Qualifies for Shingles Vaccine? Yes   Zostavax completed No   Shingrix Completed?: No.    Education has been provided regarding the importance of this vaccine. Patient has been advised to call insurance company  to determine out of pocket expense if they have not yet received this vaccine. Advised may also receive vaccine at local pharmacy or Health Dept. Verbalized acceptance and understanding.  Screening Tests Health Maintenance  Topic Date Due   Zoster Vaccines- Shingrix (1 of 2) Never done   COVID-19 Vaccine (4 - 2023-24 season) 02/16/2022   INFLUENZA VACCINE  09/16/2022 (Originally 01/16/2022)   Hepatitis C Screening  01/19/2023 (Originally 02/24/1969)   COLONOSCOPY (Pts 45-59yr Insurance coverage will need to be confirmed)  03/11/2023   Medicare Annual Wellness (AWV)  06/07/2023   MAMMOGRAM  05/25/2024   DTaP/Tdap/Td (2 - Td or Tdap) 07/19/2030   Pneumonia Vaccine 71 Years old  Completed   DEXA SCAN  Completed   HPV VACCINES  Aged Out    Health Maintenance  Health Maintenance Due  Topic Date Due   Zoster Vaccines- Shingrix (1 of 2) Never done   COVID-19 Vaccine (4 - 2023-24 season) 02/16/2022    Colorectal cancer screening: Type of screening: Colonoscopy. Completed 03/10/2018. Repeat every 5 years  Mammogram status: Completed 05/25/2022. Repeat every year  DEXA scan: 06/20/2020 Lung Cancer Screening: (Low Dose CT Chest  recommended if Age 44-80 years, 60 pack-year currently smoking OR have quit w/in 15years.) does not qualify.   Lung Cancer Screening Referral: not applicable   Additional Screening:  Hepatitis C Screening: does qualify;   Vision Screening: Recommended annual ophthalmology exams for early detection of glaucoma and other disorders of the eye. Is the patient up to date with their annual eye exam?  Yes  Who is the provider or what is the name of the office in which the patient attends annual eye exams?  If pt is not established with a provider, would they like to be referred to a provider to establish care? No .   Dental Screening: Recommended annual dental exams for proper oral hygiene  Community Resource Referral / Chronic Care Management: CRR required this  visit?  No   CCM required this visit?  No      Plan:     I have personally reviewed and noted the following in the patient's chart:   Medical and social history Use of alcohol, tobacco or illicit drugs  Current medications and supplements including opioid prescriptions. Patient is not currently taking opioid prescriptions. Functional ability and status Nutritional status Physical activity Advanced directives List of other physicians Hospitalizations, surgeries, and ER visits in previous 12 months Vitals Screenings to include cognitive, depression, and falls Referrals and appointments  In addition, I have reviewed and discussed with patient certain preventive protocols, quality metrics, and best practice recommendations. A written personalized care plan for preventive services as well as general preventive health recommendations were provided to patient.    Ellen Baker , Thank you for taking time to come for your Medicare Wellness Visit. I appreciate your ongoing commitment to your health goals. Please review the following plan we discussed and let me know if I can assist you in the future.   These are the goals we discussed:  Goals      DIET - INCREASE WATER INTAKE     Recommend to drink at least 6-8 8oz glasses of water per day.        This is a list of the screening recommended for you and due dates:  Health Maintenance  Topic Date Due   Zoster (Shingles) Vaccine (1 of 2) Never done   COVID-19 Vaccine (4 - 2023-24 season) 02/16/2022   Flu Shot  09/16/2022*   Hepatitis C Screening: USPSTF Recommendation to screen - Ages 18-79 yo.  01/19/2023*   Colon Cancer Screening  03/11/2023   Medicare Annual Wellness Visit  06/07/2023   Mammogram  05/25/2024   DTaP/Tdap/Td vaccine (2 - Td or Tdap) 07/19/2030   Pneumonia Vaccine  Completed   DEXA scan (bone density measurement)  Completed   HPV Vaccine  Aged Out  *Topic was postponed. The date shown is not the original due  date.     Wilson Singer, [place]   06/06/2022   Nurse Notes: Approximately 30 minute Non-Face -To-Face Medicare Wellness Visit

## 2022-06-07 DIAGNOSIS — B351 Tinea unguium: Secondary | ICD-10-CM | POA: Diagnosis not present

## 2022-06-07 DIAGNOSIS — M79674 Pain in right toe(s): Secondary | ICD-10-CM | POA: Diagnosis not present

## 2022-06-07 DIAGNOSIS — M79675 Pain in left toe(s): Secondary | ICD-10-CM | POA: Diagnosis not present

## 2022-06-13 DIAGNOSIS — J328 Other chronic sinusitis: Secondary | ICD-10-CM | POA: Diagnosis not present

## 2022-06-25 DIAGNOSIS — J342 Deviated nasal septum: Secondary | ICD-10-CM | POA: Diagnosis not present

## 2022-06-25 DIAGNOSIS — J343 Hypertrophy of nasal turbinates: Secondary | ICD-10-CM | POA: Diagnosis not present

## 2022-06-25 DIAGNOSIS — J320 Chronic maxillary sinusitis: Secondary | ICD-10-CM | POA: Diagnosis not present

## 2022-06-25 DIAGNOSIS — J301 Allergic rhinitis due to pollen: Secondary | ICD-10-CM | POA: Diagnosis not present

## 2022-07-01 ENCOUNTER — Ambulatory Visit
Admission: EM | Admit: 2022-07-01 | Discharge: 2022-07-01 | Disposition: A | Discharge status: Home / Self Care | Payer: PPO | Attending: Family Medicine | Admitting: Family Medicine

## 2022-07-01 ENCOUNTER — Encounter: Payer: Self-pay | Admitting: Emergency Medicine

## 2022-07-01 DIAGNOSIS — R441 Visual hallucinations: Secondary | ICD-10-CM | POA: Diagnosis not present

## 2022-07-01 LAB — CBC WITH DIFFERENTIAL/PLATELET
Abs Immature Granulocytes: 0.02 10*3/uL (ref 0.00–0.07)
Basophils Absolute: 0 10*3/uL (ref 0.0–0.1)
Basophils Relative: 1 %
Eosinophils Absolute: 0.2 10*3/uL (ref 0.0–0.5)
Eosinophils Relative: 3 %
HCT: 37.9 % (ref 36.0–46.0)
Hemoglobin: 12.4 g/dL (ref 12.0–15.0)
Immature Granulocytes: 0 %
Lymphocytes Relative: 18 %
Lymphs Abs: 1 10*3/uL (ref 0.7–4.0)
MCH: 30 pg (ref 26.0–34.0)
MCHC: 32.7 g/dL (ref 30.0–36.0)
MCV: 91.8 fL (ref 80.0–100.0)
Monocytes Absolute: 0.6 10*3/uL (ref 0.1–1.0)
Monocytes Relative: 10 %
Neutro Abs: 4.1 10*3/uL (ref 1.7–7.7)
Neutrophils Relative %: 68 %
Platelets: 244 10*3/uL (ref 150–400)
RBC: 4.13 MIL/uL (ref 3.87–5.11)
RDW: 14.8 % (ref 11.5–15.5)
WBC: 5.9 10*3/uL (ref 4.0–10.5)
nRBC: 0 % (ref 0.0–0.2)

## 2022-07-01 LAB — URINALYSIS, ROUTINE W REFLEX MICROSCOPIC
Bilirubin Urine: NEGATIVE
Glucose, UA: NEGATIVE mg/dL
Hgb urine dipstick: NEGATIVE
Ketones, ur: NEGATIVE mg/dL
Leukocytes,Ua: NEGATIVE
Nitrite: NEGATIVE
Protein, ur: NEGATIVE mg/dL
Specific Gravity, Urine: 1.01 (ref 1.005–1.030)
pH: 5.5 (ref 5.0–8.0)

## 2022-07-01 LAB — COMPREHENSIVE METABOLIC PANEL
ALT: 14 U/L (ref 0–44)
AST: 25 U/L (ref 15–41)
Albumin: 3.5 g/dL (ref 3.5–5.0)
Alkaline Phosphatase: 60 U/L (ref 38–126)
Anion gap: 10 (ref 5–15)
BUN: 11 mg/dL (ref 8–23)
CO2: 25 mmol/L (ref 22–32)
Calcium: 8.4 mg/dL — ABNORMAL LOW (ref 8.9–10.3)
Chloride: 101 mmol/L (ref 98–111)
Creatinine, Ser: 0.84 mg/dL (ref 0.44–1.00)
GFR, Estimated: >60 mL/min (ref >60)
Glucose, Bld: 105 mg/dL — ABNORMAL HIGH (ref 70–99)
Potassium: 3.9 mmol/L (ref 3.5–5.1)
Sodium: 136 mmol/L (ref 135–145)
Total Bilirubin: 0.9 mg/dL (ref 0.3–1.2)
Total Protein: 7 g/dL (ref 6.5–8.1)

## 2022-07-01 MED ORDER — FUROSEMIDE 20 MG PO TABS: 20.0000 mg | ORAL_TABLET | Freq: Every day | ORAL | 0 refills | Status: AC

## 2022-07-01 NOTE — ED Triage Notes (Signed)
Patient has been taking Prednisone.  Patient states that she is hallucinations.  Daughter is with patient and states that she has been seeing people who are not there.  Daughter states that she did fall over Thanksgiving.  This was an unwitnessed fall.

## 2022-07-01 NOTE — ED Provider Notes (Signed)
[facility]    CSN: [id number] Arrival date & time: 07/01/22  1016      History   Chief Complaint Chief Complaint  Patient presents with   Hallucinations    HPI Ellen Baker is a 72 y.o. female.   HPI   Ellen Baker presents for hallucinations for the past week.  Daughter reports her mom has been seeing people who are not there.   Last night, she says that a white [place] drove into her drive way and let people. Grand-daughter took video of her pointing to the TV to see the kids that aren't there.    Pt states she started things that are not there. She saw "people on the corner of TV screen and in the kitchen".  She tried talking to the people but they did not say anything back to her.  She is taking prednisone prescribed by her ENT prior to her nasal surgrery.  She has been taking prednisone for the past 12 days. She finished taking about 2 weeks ago.  Prior to this she was of her normal state of health. No pain medications.   States Eliquis in October for AFIB. No other new medications or supplements.  She does not take any pain medications.  Denies confusion, difficulty finding her words, weakness, numbness, tingling, headache, neck pain, fever, dysuria, back pain, nausea, vomiting, diarrhea, belly pain.  She has otherwise been in her normal state of health.     Past Medical History:  Diagnosis Date   Arthritis    right knee   Atrial fibrillation (HCC)    Carotid artery occlusion    Dizziness    GERD (gastroesophageal reflux disease)    Heart murmur    History of colon polyps    Hyperlipidemia    Hypertension    Iron deficiency    Stroke ([place]) 12/31/2012   no residual effects    Patient Active Problem List   Diagnosis Date Noted   Iron deficiency 02/07/2021   Status post total knee replacement using cement, right 12/01/2019   B12 deficiency 09/24/2018   Normocytic anemia 07/20/2018   Primary osteoarthritis of right knee 02/04/2018   Status  post total knee replacement using cement, left 02/04/2018   Obesity (BMI 35.0-39.9 without comorbidity) 01/18/2017   Primary osteoarthritis of left knee 01/18/2017   Cerebral vascular insufficiency 06/06/2015   Dyslipidemia 06/06/2015   Aftercare following surgery of the circulatory system, NEC 08/03/2013   Occlusion and stenosis of carotid artery without mention of cerebral infarction 01/16/2013   Pre-operative cardiovascular examination 01/16/2013   Carotid artery stenosis, symptomatic 01/02/2013   Occlusion and stenosis of unspecified carotid artery 01/02/2013   CVA (cerebral infarction) 12/31/2012   HTN (hypertension) 12/31/2012   GERD (gastroesophageal reflux disease) 12/31/2012   Cerebral artery occlusion with cerebral infarction ([place]) 12/31/2012   Cerebral infarction ([place]) 12/31/2012   Esophageal reflux 12/31/2012   Essential hypertension 12/31/2012    Past Surgical History:  Procedure Laterality Date   CAROTID ENDARTERECTOMY     RIGHT  01/19/13   COLONOSCOPY  08-18-12   COLONOSCOPY WITH PROPOFOL N/A 03/10/2018   Procedure: COLONOSCOPY WITH PROPOFOL;  Surgeon: Lollie Sails, MD;  Location: [facility] ENDOSCOPY;  Service: Endoscopy;  Laterality: N/A;   DILATION AND CURETTAGE OF UTERUS     ENDARTERECTOMY Right 01/19/2013   Procedure: ENDARTERECTOMY CAROTID with patch angioplasty;  Surgeon: Conrad , MD;  Location: [place];  Service: Vascular;  Laterality: Right;   ESOPHAGEAL DILATION  JOINT REPLACEMENT Left 04/15/2017   taotal knee arthorplasty Right 2021   TOTAL KNEE ARTHROPLASTY Right 12/01/2019   Procedure: TOTAL KNEE ARTHROPLASTY - RNFA;  Surgeon: Corky Mull, MD;  Location: [facility] ORS;  Service: Orthopedics;  Laterality: Right;   TUBAL LIGATION     UPPER GI ENDOSCOPY  08-18-12    OB History   No obstetric history on file.      Home Medications    Prior to Admission medications   Medication Sig Start Date End Date Taking? Authorizing Provider  alendronate  (FOSAMAX) 70 MG tablet TAKE 1 TABLET(70 MG) BY MOUTH 1 TIME A WEEK WITH A FULL GLASS OF WATER AND ON AN EMPTY STOMACH 03/23/22  Yes Juline Patch, MD  amiodarone (PACERONE) 200 MG tablet Take by mouth. 04/30/22 06/24/23 Yes [provider]  apixaban (ELIQUIS) 5 MG TABS tablet Take 5 mg by mouth 2 (two) times daily.   Yes [provider]  clopidogrel (PLAVIX) 75 MG tablet TAKE 1 TABLET BY MOUTH DAILY 01/18/22  Yes Juline Patch, MD  famotidine (PEPCID) 20 MG tablet TAKE 1 TABLET(20 MG) BY MOUTH TWICE DAILY 01/18/22  Yes Juline Patch, MD  losartan (COZAAR) 50 MG tablet Take 1 tablet (50 mg total) by mouth daily. 01/18/22  Yes Juline Patch, MD  metoprolol tartrate (LOPRESSOR) 25 MG tablet Take 25 mg by mouth 2 (two) times daily.   Yes [provider]  Multiple Vitamins-Minerals (CENTRUM SILVER ULTRA WOMENS) TABS Take 1 tablet by mouth daily.   Yes [provider]  predniSONE (DELTASONE) 5 MG tablet Take by mouth. 05/28/22  Yes [provider]  simvastatin (ZOCOR) 20 MG tablet TAKE 1 TABLET(20 MG) BY MOUTH DAILY 01/18/22  Yes Juline Patch, MD  vitamin B-12 (CYANOCOBALAMIN) 1000 MCG tablet Take 1,000 mcg by mouth once a week.   Yes [provider]  amoxicillin-clavulanate (AUGMENTIN) 875-125 MG tablet Take 1 tablet by mouth 2 (two) times daily. 05/29/22   [provider]  Calcium Carbonate-Vitamin D3 600-400 MG-UNIT TABS Take 1 tablet by mouth daily.    [provider]  DM-APAP-CPM (CORICIDIN HBP MAX STRENGTH FLU PO) Take 30 mLs by mouth daily as needed.    [provider]  furosemide (LASIX) 20 MG tablet Take 1 tablet (20 mg total) by mouth daily. 07/01/22 07/01/23  Lyndee Hensen, DO  ibuprofen (ADVIL) 200 MG tablet Take by mouth. Patient not taking: Reported on 06/06/2022    [provider]  Iron-Vitamin C 65-125 MG TABS Take 1 tablet by mouth 2 (two) times daily. 07/21/18   Karen Kitchens, NP  loratadine  (CLARITIN) 10 MG tablet Take 1 tablet (10 mg total) by mouth continuous as needed for allergies. 03/31/18   Juline Patch, MD  meclizine (ANTIVERT) 25 MG tablet Take 1 tablet (25 mg total) by mouth 3 (three) times daily as needed for dizziness. Patient not taking: Reported on 06/06/2022 01/23/21   Juline Patch, MD  metoprolol tartrate (LOPRESSOR) 50 MG tablet Take 1 tablet by mouth 2 (two) times daily. 04/30/22 04/30/23  [provider]    Family History Family History  Problem Relation Age of Onset   Stroke Mother    Deep vein thrombosis Mother    Heart disease Mother        Amputation-   Hypertension Mother    Alzheimer's disease Mother    Heart failure Father    Heart disease Father  CHF   Hypertension Father    Varicose Veins Father    Parkinson's disease Father    Breast cancer Paternal Aunt 65    Social History Social History   Tobacco Use   Smoking status: Never   Smokeless tobacco: Never   Tobacco comments:    Smoking cessation materials not required  Vaping Use   Vaping Use: Never used  Substance Use Topics   Alcohol use: No    Alcohol/week: 0.0 standard drinks of alcohol   Drug use: No     Allergies   Adhesive [tape]   Review of Systems Review of Systems: negative unless otherwise stated in HPI.      Physical Exam Triage Vital Signs ED Triage Vitals  Enc Vitals Group     BP 07/01/22 1058 128/71     Pulse Rate 07/01/22 1058 62     Resp 07/01/22 1058 14     Temp 07/01/22 1058 98 F (36.7 C)     Temp Source 07/01/22 1057 Temporal     SpO2 07/01/22 1058 99 %     Weight 07/01/22 1052 214 lb 11.7 oz (97.4 kg)     Height 07/01/22 1052 '5\' 4"'$  (1.626 m)     Head Circumference --      Peak Flow --      Pain Score 07/01/22 1052 0     Pain Loc --      Pain Edu? --      Excl. in [place]? --    No data found.  Updated Vital Signs BP 128/71 (BP Location: Right Arm)   Pulse 62   Temp 98 F (36.7 C) (Temporal)   Resp 14   Ht '5\' 4"'$   (1.626 m)   Wt 97.4 kg   SpO2 99%   BMI 36.86 kg/m   Visual Acuity Right Eye Distance:   Left Eye Distance:   Bilateral Distance:    Right Eye Near:   Left Eye Near:    Bilateral Near:     Physical Exam GEN:     alert, cooperative, not toxic appearing elderly female and no distress    HENT:  mucus membranes moist, oropharyngeal without lesions or erythema,  nares patent, no nasal discharge  EYES:   pupils equal and reactive, EOM intact, wears glasses  NECK:  supple, normal ROM RESP:  clear to auscultation bilaterally, no increased work of breathing  CVS:   regular rate and rhythm,  EXT:   2+ pitting edema to knee NEURO:  alert, oriented, speech normal, CN 2-12 grossly intact, no facial droop,  sensation grossly intact, strength 5/5 bilateral UE and LE, normal coordination, normal finger to nose Skin:   warm and dry Psych: Normal affect, appropriate speech and behavior      UC Treatments / Results  Labs (all labs ordered are listed, but only abnormal results are displayed) Labs Reviewed  COMPREHENSIVE METABOLIC PANEL - Abnormal; Notable for the following components:      Result Value   Glucose, Bld 105 (*)    Calcium 8.4 (*)    All other components within normal limits  URINALYSIS, ROUTINE W REFLEX MICROSCOPIC  CBC WITH DIFFERENTIAL/PLATELET    EKG   Radiology No results found.  Procedures Procedures (including critical care time)  Medications Ordered in UC Medications - No data to display  Initial Impression / Assessment and Plan / UC Course  I have reviewed the triage vital signs and the nursing notes.  Pertinent labs & imaging results  that were available during my care of the patient were reviewed by me and considered in my medical decision making (see chart for details).       Patient is a 72 y.o. female  who presents for visual hallucinations.  Overall patient is nontoxic-appearing and afebrile.  Vital signs stable. She is afebrile. VSS.  Urinalysis  evidence or pyuria.  CMP grossly unremarkable.  CBC is normal.  This does not appear to be metabolic in nature.  Recently completed 12-day course of prednisone. Started Eliquis both of which can cause hallucinations.   Based on history however, her symptoms seem to start when it Starts to darken and get worse throughout the night.  By morning she is fine.  I am concerned that she may be sundowning.  She has no history of dementia.  She also could be having delirium.  The etiology of her symptoms are not yet identified.  Neurological exams are normal.  She is able to converse with me and recall specific events about her life.  Her memory seems to be intact.  She is not having confusion.  I do not think this is an acute stroke or TIA.  On exam, she does have 2+ pitting edema.  She is out of her Lasix.  Refilled this medication for her.  She was offered potassium at this however she prefers to eat bananas instead.  Advised family and patient to take sundowning measures at home.  Plenty of ED precautions were given.  She is to follow-up with her primary care provider as she may need a baseline MoCA in the near future.  Daughter and patient voiced understanding.  Discussed MDM, treatment plan and plan for follow-up with patient/daughter who agree with plan.   Final Clinical Impressions(s) / UC Diagnoses   Final diagnoses:  Visual hallucinations     Discharge Instructions      For your leg swelling: take 1 tablet of Lasix/furosemide for the next 3 days.  Your lab work and urine did not show cause of infection, inflammation or other reason to have visual hallucinations.    Be sure to follow up with your primary care provider about sundowning.  Sundowning prevention -Go outside or at least sit by the window as exposure to bright light can help reset your bodies clock -Get physical activity or exercise each day -Get daytime rest if needed, keep naps short and not too late in the day to prevent  staying up all night -Get enough rest at night  Avoid things that seem to make sundowning worse: -Do not serve coffee, cola, or other drinks with caffeine late in the day. -Do not serve alcoholic drinks. They may add to confusion and anxiety. -Do not plan too many activities during the day. A full schedule can be tiring.  Go to the emergency room if you have confusion, difficulty walking or weakness.      ED Prescriptions     Medication Sig Dispense Auth. Provider   furosemide (LASIX) 20 MG tablet Take 1 tablet (20 mg total) by mouth daily. 30 tablet Lyndee Hensen, DO      PDMP not reviewed this encounter.   Lyndee Hensen, DO 07/04/22 1046

## 2022-07-01 NOTE — Discharge Instructions (Addendum)
For your leg swelling: take 1 tablet of Lasix/furosemide for the next 3 days.  Your lab work and urine did not show cause of infection, inflammation or other reason to have visual hallucinations.    Be sure to follow up with your primary care provider about sundowning.  Sundowning prevention -Go outside or at least sit by the window as exposure to bright light can help reset your bodies clock -Get physical activity or exercise each day -Get daytime rest if needed, keep naps short and not too late in the day to prevent staying up all night -Get enough rest at night  Avoid things that seem to make sundowning worse: -Do not serve coffee, cola, or other drinks with caffeine late in the day. -Do not serve alcoholic drinks. They may add to confusion and anxiety. -Do not plan too many activities during the day. A full schedule can be tiring.  Go to the emergency room if you have confusion, difficulty walking or weakness.

## 2022-07-16 ENCOUNTER — Other Ambulatory Visit: Payer: Self-pay

## 2022-07-16 DIAGNOSIS — M816 Localized osteoporosis [Lequesne]: Secondary | ICD-10-CM

## 2022-07-16 MED ORDER — ALENDRONATE SODIUM 70 MG PO TABS: ORAL_TABLET | ORAL | 0 refills | Status: DC

## 2022-07-23 ENCOUNTER — Encounter: Payer: Self-pay | Admitting: Family Medicine

## 2022-07-23 ENCOUNTER — Ambulatory Visit (INDEPENDENT_AMBULATORY_CARE_PROVIDER_SITE_OTHER): Payer: PPO | Admitting: Family Medicine

## 2022-07-23 VITALS — BP 122/70 | HR 60 | Ht 64.0 in | Wt 208.0 lb

## 2022-07-23 DIAGNOSIS — I679 Cerebrovascular disease, unspecified: Secondary | ICD-10-CM

## 2022-07-23 DIAGNOSIS — M816 Localized osteoporosis [Lequesne]: Secondary | ICD-10-CM

## 2022-07-23 DIAGNOSIS — I10 Essential (primary) hypertension: Secondary | ICD-10-CM

## 2022-07-23 DIAGNOSIS — K219 Gastro-esophageal reflux disease without esophagitis: Secondary | ICD-10-CM

## 2022-07-23 MED ORDER — CLOPIDOGREL BISULFATE 75 MG PO TABS: ORAL_TABLET | ORAL | 1 refills | Status: DC

## 2022-07-23 MED ORDER — LOSARTAN POTASSIUM 50 MG PO TABS: 50.0000 mg | ORAL_TABLET | Freq: Every day | ORAL | 1 refills | Status: DC

## 2022-07-23 MED ORDER — ALENDRONATE SODIUM 70 MG PO TABS: ORAL_TABLET | ORAL | 1 refills | Status: AC

## 2022-07-23 MED ORDER — FAMOTIDINE 20 MG PO TABS: ORAL_TABLET | ORAL | 1 refills | Status: DC

## 2022-07-23 NOTE — Progress Notes (Signed)
Date:  07/23/2022   Name:  Ellen Baker   DOB:  1950/07/18   MRN:  [id number]   Chief Complaint: Osteoporosis, Gastroesophageal Reflux, Hypertension, and cerebral vascular disease  Gastroesophageal Reflux She reports no abdominal pain, no belching, no chest pain, no choking, no coughing, no dysphagia, no early satiety, no globus sensation, no heartburn, no hoarse voice, no nausea, no sore throat or no wheezing. The problem has been gradually improving. The symptoms are aggravated by certain foods. Pertinent negatives include no fatigue. She has tried a histamine-2 antagonist for the symptoms. The treatment provided moderate relief.  Hypertension The problem has been gradually improving since onset. The problem is controlled. Pertinent negatives include no chest pain, orthopnea or shortness of breath. There are no associated agents to hypertension.    Lab Results  Component Value Date   NA 136 07/01/2022   K 3.9 07/01/2022   CO2 25 07/01/2022   GLUCOSE 105 (H) 07/01/2022   BUN 11 07/01/2022   CREATININE 0.84 07/01/2022   CALCIUM 8.4 (L) 07/01/2022   GFRNONAA >60 07/01/2022   Lab Results  Component Value Date   CHOL 123 05/03/2022   HDL 49 05/03/2022   LDLCALC 63 05/03/2022   TRIG 53 05/03/2022   CHOLHDL 2.5 05/03/2022   Lab Results  Component Value Date   TSH 1.757 07/21/2018   Lab Results  Component Value Date   HGBA1C 5.3 01/01/2013   Lab Results  Component Value Date   WBC 5.9 07/01/2022   HGB 12.4 07/01/2022   HCT 37.9 07/01/2022   MCV 91.8 07/01/2022   PLT 244 07/01/2022   Lab Results  Component Value Date   ALT 14 07/01/2022   AST 25 07/01/2022   ALKPHOS 60 07/01/2022   BILITOT 0.9 07/01/2022   No results found for: "25OHVITD2", "25OHVITD3", "VD25OH"   Review of Systems  Constitutional:  Negative for diaphoresis, fatigue and unexpected weight change.  HENT:  Negative for hoarse voice and sore throat.   Respiratory:  Negative for cough,  choking, shortness of breath and wheezing.   Cardiovascular:  Negative for chest pain and orthopnea.  Gastrointestinal:  Negative for abdominal pain, dysphagia, heartburn and nausea.    Patient Active Problem List   Diagnosis Date Noted   Iron deficiency 02/07/2021   Status post total knee replacement using cement, right 12/01/2019   B12 deficiency 09/24/2018   Normocytic anemia 07/20/2018   Primary osteoarthritis of right knee 02/04/2018   Status post total knee replacement using cement, left 02/04/2018   Obesity (BMI 35.0-39.9 without comorbidity) 01/18/2017   Primary osteoarthritis of left knee 01/18/2017   Cerebral vascular insufficiency 06/06/2015   Dyslipidemia 06/06/2015   Aftercare following surgery of the circulatory system, NEC 08/03/2013   Occlusion and stenosis of carotid artery without mention of cerebral infarction 01/16/2013   Pre-operative cardiovascular examination 01/16/2013   Carotid artery stenosis, symptomatic 01/02/2013   Occlusion and stenosis of unspecified carotid artery 01/02/2013   CVA (cerebral infarction) 12/31/2012   HTN (hypertension) 12/31/2012   GERD (gastroesophageal reflux disease) 12/31/2012   Cerebral artery occlusion with cerebral infarction ([place]) 12/31/2012   Cerebral infarction ([place]) 12/31/2012   Esophageal reflux 12/31/2012   Essential hypertension 12/31/2012    Allergies  Allergen Reactions   Adhesive [Tape] Rash    Skin became RAW    Past Surgical History:  Procedure Laterality Date   CAROTID ENDARTERECTOMY     RIGHT  01/19/13   COLONOSCOPY  08-18-12   COLONOSCOPY WITH  PROPOFOL N/A 03/10/2018   Procedure: COLONOSCOPY WITH PROPOFOL;  Surgeon: Lollie Sails, MD;  Location: [facility] ENDOSCOPY;  Service: Endoscopy;  Laterality: N/A;   DILATION AND CURETTAGE OF UTERUS     ENDARTERECTOMY Right 01/19/2013   Procedure: ENDARTERECTOMY CAROTID with patch angioplasty;  Surgeon: Conrad [place], MD;  Location: [place];  Service: Vascular;   Laterality: Right;   ESOPHAGEAL DILATION     JOINT REPLACEMENT Left 04/15/2017   taotal knee arthorplasty Right 2021   TOTAL KNEE ARTHROPLASTY Right 12/01/2019   Procedure: TOTAL KNEE ARTHROPLASTY - RNFA;  Surgeon: Corky Mull, MD;  Location: [facility] ORS;  Service: Orthopedics;  Laterality: Right;   TUBAL LIGATION     UPPER GI ENDOSCOPY  08-18-12    Social History   Tobacco Use   Smoking status: Never   Smokeless tobacco: Never   Tobacco comments:    Smoking cessation materials not required  Vaping Use   Vaping Use: Never used  Substance Use Topics   Alcohol use: No    Alcohol/week: 0.0 standard drinks of alcohol   Drug use: No     Medication list has been reviewed and updated.  Current Meds  Medication Sig   alendronate (FOSAMAX) 70 MG tablet TAKE 1 TABLET(70 MG) BY MOUTH 1 TIME A WEEK WITH A FULL GLASS OF WATER AND ON AN EMPTY STOMACH   amiodarone (PACERONE) 200 MG tablet Take by mouth.   apixaban (ELIQUIS) 5 MG TABS tablet Take 5 mg by mouth 2 (two) times daily.   Calcium Carbonate-Vitamin D3 600-400 MG-UNIT TABS Take 1 tablet by mouth daily.   clopidogrel (PLAVIX) 75 MG tablet TAKE 1 TABLET BY MOUTH DAILY   famotidine (PEPCID) 20 MG tablet TAKE 1 TABLET(20 MG) BY MOUTH TWICE DAILY   furosemide (LASIX) 20 MG tablet Take 1 tablet (20 mg total) by mouth daily.   Iron-Vitamin C 65-125 MG TABS Take 1 tablet by mouth 2 (two) times daily.   metoprolol tartrate (LOPRESSOR) 25 MG tablet Take 25 mg by mouth 2 (two) times daily.   metoprolol tartrate (LOPRESSOR) 50 MG tablet Take 1 tablet by mouth 2 (two) times daily.   Multiple Vitamins-Minerals (CENTRUM SILVER ULTRA WOMENS) TABS Take 1 tablet by mouth daily.   vitamin B-12 (CYANOCOBALAMIN) 1000 MCG tablet Take 1,000 mcg by mouth once a week.       07/23/2022   10:10 AM 04/09/2022    4:15 PM 01/18/2022   10:13 AM 07/21/2021    9:10 AM  GAD 7 : Generalized Anxiety Score  Nervous, Anxious, on Edge 0 0 0 0  Control/stop worrying 0 0  0 1  Worry too much - different things 0 0 0 0  Trouble relaxing 0 0 0 0  Restless 0 0 0 0  Easily annoyed or irritable 0 0 0 0  Afraid - awful might happen 0 0 0 0  Total GAD 7 Score 0 0 0 1  Anxiety Difficulty Not difficult at all Not difficult at all Not difficult at all Not difficult at all       07/23/2022   10:09 AM 04/09/2022    4:15 PM 01/18/2022   10:13 AM  Depression screen PHQ 2/9  Decreased Interest 0 0 0  Down, Depressed, Hopeless 0 0 0  PHQ - 2 Score 0 0 0  Altered sleeping 0 0 2  Tired, decreased energy 0 0 2  Change in appetite 0 0 0  Feeling bad or failure about yourself  0 0 0  Trouble concentrating 0 0 0  Moving slowly or fidgety/restless 0 0 0  Suicidal thoughts 0 0 0  PHQ-9 Score 0 0 4  Difficult doing work/chores Not difficult at all Not difficult at all Not difficult at all    BP Readings from Last 3 Encounters:  07/23/22 122/70  07/01/22 128/71  05/03/22 128/88    Physical Exam Vitals and nursing note reviewed. Exam conducted with a chaperone present.  Constitutional:      General: She is not in acute distress.    Appearance: She is not diaphoretic.  HENT:     Head: Normocephalic and atraumatic.     Right Ear: External ear normal.     Left Ear: External ear normal.     Nose: Nose normal.  Eyes:     General:        Right eye: No discharge.        Left eye: No discharge.     Conjunctiva/sclera: Conjunctivae normal.     Pupils: Pupils are equal, round, and reactive to light.  Neck:     Thyroid: No thyromegaly.     Vascular: No JVD.  Cardiovascular:     Rate and Rhythm: Normal rate and regular rhythm.     Heart sounds: Normal heart sounds, S1 normal and S2 normal. No murmur heard.    No systolic murmur is present.     No diastolic murmur is present.     No friction rub. No gallop. No S3 or S4 sounds.  Pulmonary:     Effort: Pulmonary effort is normal.     Breath sounds: Normal breath sounds. No wheezing or rhonchi.  Abdominal:      General: Bowel sounds are normal.     Palpations: Abdomen is soft. There is no mass.     Tenderness: There is no abdominal tenderness. There is no guarding.  Musculoskeletal:        General: Normal range of motion.     Cervical back: Normal range of motion and neck supple.  Lymphadenopathy:     Cervical: No cervical adenopathy.  Skin:    General: Skin is warm and dry.  Neurological:     Mental Status: She is alert.     Wt Readings from Last 3 Encounters:  07/23/22 208 lb (94.3 kg)  07/01/22 214 lb 11.7 oz (97.4 kg)  04/23/22 214 lb 11.2 oz (97.4 kg)    BP 122/70   Pulse 60   Ht '5\' 4"'$  (1.626 m)   Wt 208 lb (94.3 kg)   SpO2 96%   BMI 35.70 kg/m   Assessment and Plan: 1. Essential hypertension Chronic.  Controlled.  Stable.  Blood pressure 122/70.  Patient is only taken furosemide and she is stopped taking her losartan because of cough unknown suggestion of Dr. Clayborn Bigness.  We will verify that this is been stopped and not fill it at this time until we have verification.  2. Gastroesophageal reflux disease, unspecified whether esophagitis present Chronic.  Controlled.  Stable.  Continue famotidine 20 mg twice a day. - famotidine (PEPCID) 20 MG tablet; TAKE 1 TABLET(20 MG) BY MOUTH TWICE DAILY  Dispense: 180 tablet; Refill: 1  3. Hypocalcemia Mild decrease in calcium in we will check a phosphorus as well today to see if there is compensatory increase that may suggest parathyroid concern. - Calcium - Phosphorus  4. Localized osteoporosis without current pathological fracture Chronic.  Controlled.  Stable.  Continue Fosamax 70 mg once a  day. - alendronate (FOSAMAX) 70 MG tablet; TAKE 1 TABLET(70 MG) BY MOUTH 1 TIME A WEEK WITH A FULL GLASS OF WATER AND ON AN EMPTY STOMACH  Dispense: 12 tablet; Refill: 1  5. Cerebral vascular insufficiency Chronic.  Controlled.  Stable.  No CVA activity noted since the last visit and will continue Plavix 75 mg daily. - clopidogrel (PLAVIX) 75 MG  tablet; TAKE 1 TABLET BY MOUTH DAILY  Dispense: 90 tablet; Refill: 1     Otilio Miu, MD

## 2022-07-25 ENCOUNTER — Inpatient Hospital Stay: Payer: PPO | Admitting: Internal Medicine

## 2022-07-25 ENCOUNTER — Encounter: Payer: Self-pay | Admitting: Internal Medicine

## 2022-07-25 ENCOUNTER — Inpatient Hospital Stay: Payer: PPO | Attending: Oncology

## 2022-07-25 VITALS — BP 117/82 | HR 108 | Temp 97.6°F | Resp 18 | Wt 205.3 lb

## 2022-07-25 DIAGNOSIS — R011 Cardiac murmur, unspecified: Secondary | ICD-10-CM | POA: Diagnosis not present

## 2022-07-25 DIAGNOSIS — I509 Heart failure, unspecified: Secondary | ICD-10-CM | POA: Diagnosis not present

## 2022-07-25 DIAGNOSIS — D509 Iron deficiency anemia, unspecified: Secondary | ICD-10-CM

## 2022-07-25 DIAGNOSIS — Z803 Family history of malignant neoplasm of breast: Secondary | ICD-10-CM | POA: Insufficient documentation

## 2022-07-25 DIAGNOSIS — I110 Hypertensive heart disease with heart failure: Secondary | ICD-10-CM | POA: Diagnosis not present

## 2022-07-25 DIAGNOSIS — K219 Gastro-esophageal reflux disease without esophagitis: Secondary | ICD-10-CM | POA: Diagnosis not present

## 2022-07-25 DIAGNOSIS — Z8673 Personal history of transient ischemic attack (TIA), and cerebral infarction without residual deficits: Secondary | ICD-10-CM | POA: Diagnosis not present

## 2022-07-25 DIAGNOSIS — E538 Deficiency of other specified B group vitamins: Secondary | ICD-10-CM | POA: Insufficient documentation

## 2022-07-25 DIAGNOSIS — I4891 Unspecified atrial fibrillation: Secondary | ICD-10-CM | POA: Diagnosis not present

## 2022-07-25 DIAGNOSIS — Z79899 Other long term (current) drug therapy: Secondary | ICD-10-CM | POA: Insufficient documentation

## 2022-07-25 DIAGNOSIS — E611 Iron deficiency: Secondary | ICD-10-CM

## 2022-07-25 DIAGNOSIS — Z7902 Long term (current) use of antithrombotics/antiplatelets: Secondary | ICD-10-CM | POA: Diagnosis not present

## 2022-07-25 DIAGNOSIS — E785 Hyperlipidemia, unspecified: Secondary | ICD-10-CM | POA: Insufficient documentation

## 2022-07-25 DIAGNOSIS — Z7901 Long term (current) use of anticoagulants: Secondary | ICD-10-CM | POA: Insufficient documentation

## 2022-07-25 DIAGNOSIS — Z86010 Personal history of colonic polyps: Secondary | ICD-10-CM | POA: Diagnosis not present

## 2022-07-25 LAB — CBC
HCT: 40.1 % (ref 36.0–46.0)
Hemoglobin: 12.8 g/dL (ref 12.0–15.0)
MCH: 30.2 pg (ref 26.0–34.0)
MCHC: 31.9 g/dL (ref 30.0–36.0)
MCV: 94.6 fL (ref 80.0–100.0)
Platelets: 270 10*3/uL (ref 150–400)
RBC: 4.24 MIL/uL (ref 3.87–5.11)
RDW: 14.7 % (ref 11.5–15.5)
WBC: 8.5 10*3/uL (ref 4.0–10.5)
nRBC: 0 % (ref 0.0–0.2)

## 2022-07-25 LAB — BASIC METABOLIC PANEL
Anion gap: 10 (ref 5–15)
BUN: 14 mg/dL (ref 8–23)
CO2: 30 mmol/L (ref 22–32)
Calcium: 8.5 mg/dL — ABNORMAL LOW (ref 8.9–10.3)
Chloride: 99 mmol/L (ref 98–111)
Creatinine, Ser: 0.99 mg/dL (ref 0.44–1.00)
GFR, Estimated: >60 mL/min (ref >60)
Glucose, Bld: 107 mg/dL — ABNORMAL HIGH (ref 70–99)
Potassium: 3.9 mmol/L (ref 3.5–5.1)
Sodium: 139 mmol/L (ref 135–145)

## 2022-07-25 LAB — FERRITIN: Ferritin: 20 ng/mL (ref 11–307)

## 2022-07-25 LAB — IRON AND TIBC
Iron: 59 ug/dL (ref 28–170)
Saturation Ratios: 11 % (ref 10.4–31.8)
TIBC: 535 ug/dL — ABNORMAL HIGH (ref 250–450)
UIBC: 476 ug/dL

## 2022-07-25 LAB — VITAMIN B12: Vitamin B-12: 400 pg/mL (ref 180–914)

## 2022-07-25 NOTE — Progress Notes (Signed)
Now being followed by ENT for nasal drainage.  New diagnoses of A-fib and CHF since last visit.    Difficulty sleeping.

## 2022-07-25 NOTE — Progress Notes (Signed)
[place] NOTE  Patient Care Team: Juline Patch, MD as PCP - General (Family Medicine) Reche Dixon, PA-C (Orthopedic Surgery) Juline Patch, MD (Family Medicine)  CHIEF COMPLAINTS/PURPOSE OF CONSULTATION:  Iron/B12 deficiency  #  Oncology History   No history exists.     HISTORY OF PRESENTING ILLNESS: Alone.  Ambulating independently.  Ellen Baker 72 y.o.  female history of iron deficiency/B12 deficiency is here for follow-up.  Patient recently being diagnosed with A-fib and CHF since last visit.     Patient has been on oral iron supplementation Vitron-C twice a day.  She is also on B12 supplementation once a week.  Patient denies any blood in stools black or stools daily nausea vomiting.  Has any tingling or numbness.  No falls.  Review of Systems  Constitutional:  Positive for malaise/fatigue. Negative for chills, diaphoresis, fever and weight loss.  HENT:  Negative for nosebleeds and sore throat.   Eyes:  Negative for double vision.  Respiratory:  Negative for cough, hemoptysis, sputum production, shortness of breath and wheezing.   Cardiovascular:  Negative for chest pain, palpitations, orthopnea and leg swelling.  Gastrointestinal:  Negative for abdominal pain, blood in stool, constipation, diarrhea, heartburn, melena, nausea and vomiting.  Genitourinary:  Negative for dysuria, frequency and urgency.  Musculoskeletal:  Negative for back pain and joint pain.  Skin: Negative.  Negative for itching and rash.  Neurological:  Negative for dizziness, tingling, focal weakness, weakness and headaches.  Endo/Heme/Allergies:  Does not bruise/bleed easily.  Psychiatric/Behavioral:  Negative for depression. The patient is not nervous/anxious and does not have insomnia.      MEDICAL HISTORY:  Past Medical History:  Diagnosis Date   Arthritis    right knee   Atrial fibrillation (HCC)    Carotid artery occlusion    Dizziness    GERD  (gastroesophageal reflux disease)    Heart murmur    History of colon polyps    Hyperlipidemia    Hypertension    Iron deficiency    Stroke ([place]) 12/31/2012   no residual effects    SURGICAL HISTORY: Past Surgical History:  Procedure Laterality Date   CAROTID ENDARTERECTOMY     RIGHT  01/19/13   COLONOSCOPY  08-18-12   COLONOSCOPY WITH PROPOFOL N/A 03/10/2018   Procedure: COLONOSCOPY WITH PROPOFOL;  Surgeon: Lollie Sails, MD;  [facility]: [facility] ENDOSCOPY;  Service: Endoscopy;  Laterality: N/A;   DILATION AND CURETTAGE OF UTERUS     ENDARTERECTOMY Right 01/19/2013   Procedure: ENDARTERECTOMY CAROTID with patch angioplasty;  Surgeon: Conrad Keller, MD;  Location: [place];  Service: Vascular;  Laterality: Right;   ESOPHAGEAL DILATION     JOINT REPLACEMENT Left 04/15/2017   taotal knee arthorplasty Right 2021   TOTAL KNEE ARTHROPLASTY Right 12/01/2019   Procedure: TOTAL KNEE ARTHROPLASTY - RNFA;  Surgeon: Corky Mull, MD;  Location: [facility] ORS;  Service: Orthopedics;  Laterality: Right;   TUBAL LIGATION     UPPER GI ENDOSCOPY  08-18-12    SOCIAL HISTORY: Social History   Socioeconomic History   Marital status: Widowed    Spouse name: Not on file   Number of children: 3   Years of education: Not on file   Highest education level: 12th grade  Occupational History   Occupation: retired  Tobacco Use   Smoking status: Never   Smokeless tobacco: Never   Tobacco comments:    Smoking cessation materials not required  Vaping Use  Vaping Use: Never used  Substance and Sexual Activity   Alcohol use: No    Alcohol/week: 0.0 standard drinks of alcohol   Drug use: No   Sexual activity: Never  Other Topics Concern   Not on file  Social History Narrative   Pt lives alone.    Social Determinants of Health   Financial Resource Strain: Low Risk  (06/06/2022)   Overall Financial Resource Strain (CARDIA)    Difficulty of Paying Living Expenses: Not hard at all  Food Insecurity: No Food  Insecurity (06/06/2022)   Hunger Vital Sign    Worried About Running Out of Food in the Last Year: Never true    Ran Out of Food in the Last Year: Never true  Transportation Needs: No Transportation Needs (06/06/2022)   PRAPARE - Hydrologist (Medical): No    Lack of Transportation (Non-Medical): No  Physical Activity: Inactive (06/06/2022)   Exercise Vital Sign    Days of Exercise per Week: 0 days    Minutes of Exercise per Session: 0 min  Stress: No Stress Concern Present (06/06/2022)   [place]    Feeling of Stress : Only a little  Social Connections: Moderately Integrated (06/06/2022)   Social Connection and Isolation Panel [NHANES]    Frequency of Communication with Friends and Family: More than three times a week    Frequency of Social Gatherings with Friends and Family: More than three times a week    Attends Religious Services: More than 4 times per year    Active Member of [employer] or Organizations: Yes    Attends Archivist Meetings: More than 4 times per year    Marital Status: Widowed  Intimate Partner Violence: Not At Risk (06/06/2022)   Humiliation, Afraid, Rape, and Kick questionnaire    Fear of Current or Ex-Partner: No    Emotionally Abused: No    Physically Abused: No    Sexually Abused: No    FAMILY HISTORY: Family History  Problem Relation Age of Onset   Stroke Mother    Deep vein thrombosis Mother    Heart disease Mother        Amputation-   Hypertension Mother    Alzheimer's disease Mother    Heart failure Father    Heart disease Father        CHF   Hypertension Father    Varicose Veins Father    Parkinson's disease Father    Breast cancer Paternal Aunt 76    ALLERGIES:  is allergic to adhesive [tape].  MEDICATIONS:  Current Outpatient Medications  Medication Sig Dispense Refill   alendronate (FOSAMAX) 70 MG tablet TAKE 1 TABLET(70 MG) BY  MOUTH 1 TIME A WEEK WITH A FULL GLASS OF WATER AND ON AN EMPTY STOMACH 12 tablet 1   amiodarone (PACERONE) 200 MG tablet Take by mouth.     apixaban (ELIQUIS) 5 MG TABS tablet Take 5 mg by mouth 2 (two) times daily.     Calcium Carbonate-Vitamin D3 600-400 MG-UNIT TABS Take 2 tablets by mouth daily.     clopidogrel (PLAVIX) 75 MG tablet TAKE 1 TABLET BY MOUTH DAILY 90 tablet 1   famotidine (PEPCID) 20 MG tablet TAKE 1 TABLET(20 MG) BY MOUTH TWICE DAILY 180 tablet 1   furosemide (LASIX) 20 MG tablet Take 1 tablet (20 mg total) by mouth daily. (Patient taking differently: Take 20 mg by mouth 2 (two) times daily.) 30  tablet 0   Iron-Vitamin C 65-125 MG TABS Take 1 tablet by mouth 2 (two) times daily. (Patient taking differently: Take 1 tablet by mouth once a week.) 60 tablet 1   metoprolol tartrate (LOPRESSOR) 25 MG tablet Take 25 mg by mouth 2 (two) times daily.     metoprolol tartrate (LOPRESSOR) 50 MG tablet Take 1 tablet by mouth 2 (two) times daily.     Multiple Vitamins-Minerals (CENTRUM SILVER ULTRA WOMENS) TABS Take 1 tablet by mouth daily.     vitamin B-12 (CYANOCOBALAMIN) 1000 MCG tablet Take 1,000 mcg by mouth once a week.     ibuprofen (ADVIL) 200 MG tablet Take by mouth. (Patient not taking: Reported on 06/06/2022)     loratadine (CLARITIN) 10 MG tablet Take 1 tablet (10 mg total) by mouth continuous as needed for allergies. (Patient not taking: Reported on 07/23/2022) 30 tablet 6   meclizine (ANTIVERT) 25 MG tablet Take 1 tablet (25 mg total) by mouth 3 (three) times daily as needed for dizziness. (Patient not taking: Reported on 06/06/2022) 30 tablet 0   simvastatin (ZOCOR) 20 MG tablet TAKE 1 TABLET(20 MG) BY MOUTH DAILY (Patient not taking: Reported on 07/23/2022) 90 tablet 1   No current facility-administered medications for this visit.      Marland Kitchen  PHYSICAL EXAMINATION: ECOG PERFORMANCE STATUS: 0 - Asymptomatic  Vitals:   07/25/22 1500  BP: 117/82  Pulse: (!) 108  Resp: 18   Temp: 97.6 F (36.4 C)   Filed Weights   07/25/22 1500  Weight: 205 lb 4.8 oz (93.1 kg)    Physical Exam Vitals and nursing note reviewed.  Constitutional:      Comments:     HENT:     Head: Normocephalic and atraumatic.     Mouth/Throat:     Mouth: Mucous membranes are moist.     Pharynx: No oropharyngeal exudate.  Eyes:     Pupils: Pupils are equal, round, and reactive to light.  Cardiovascular:     Rate and Rhythm: Normal rate and regular rhythm.  Pulmonary:     Effort: No respiratory distress.     Breath sounds: No wheezing.     Comments: Decreased breath sounds bilaterally at bases.  No wheeze or crackles Abdominal:     General: Bowel sounds are normal. There is no distension.     Palpations: Abdomen is soft. There is no mass.     Tenderness: There is no abdominal tenderness. There is no guarding or rebound.  Musculoskeletal:        General: No tenderness. Normal range of motion.     Cervical back: Normal range of motion and neck supple.  Skin:    General: Skin is warm.  Neurological:     Mental Status: She is alert and oriented to person, place, and time.  Psychiatric:        Mood and Affect: Affect normal.        Judgment: Judgment normal.     LABORATORY DATA:  I have reviewed the data as listed Lab Results  Component Value Date   WBC 8.5 07/25/2022   HGB 12.8 07/25/2022   HCT 40.1 07/25/2022   MCV 94.6 07/25/2022   PLT 270 07/25/2022   Recent Labs    02/02/22 1012 05/03/22 1046 07/01/22 1148 07/25/22 1448  NA 140 137 136 139  K 3.7 3.7 3.9 3.9  CL 104 98 101 99  CO2 '31 28 25 30  '$ GLUCOSE 120* 107* 105* 107*  BUN  $'12 9 11 14  'q$ CREATININE 0.82 0.89 0.84 0.99  CALCIUM 9.4 8.6* 8.4* 8.5*  GFRNONAA >60 >60 >60 >60  PROT 7.7 7.3 7.0  --   ALBUMIN 3.8 3.9 3.5  --   AST '21 30 25  '$ --   ALT '12 20 14  '$ --   ALKPHOS 70 59 60  --   BILITOT 0.9 1.5* 0.9  --     RADIOGRAPHIC STUDIES: I have personally reviewed the radiological images as listed and  agreed with the findings in the report. No results found.  ASSESSMENT & PLAN:   Iron deficiency # iron deficiency- Plavix; on oral iron tolerating well.    [SEP 2023- iron sat-4; ferritin- 33]-February 2024 Hb- 12.3.  Vitron-C BID; recommend one extra pill every other day. Hold off IV iron.    # ETIOLOGY: EGD on 08/18/2012; Colonoscopy on 03/10/2018 revealed diverticulosis in the sigmoid colon, in the descending colon, in the transverse colon and in the ascending colon.  There was one 3 mm polyp at the hepatic flexure (tubular adenoma) and two 4 to 6 mm polyps (hyperplastic) in the sigmoid colon.   # B12 deficiency: B12 pill once a week; Feb 2023- WNL.   # A-fib/CHF- on Eliquis.? Plavix [per cardiology]  # DISPOSITION: # follow up in 6 months- MD; 2-3 days prior-labs- cbc/cmp/B12 levels; Iron studies/ferritin; possible venofer - Dr.B   All questions were answered. The patient knows to call the clinic with any problems, questions or concerns.     Cammie Sickle, MD 07/25/2022 3:34 PM

## 2022-07-25 NOTE — Assessment & Plan Note (Addendum)
#   iron deficiency- Plavix; on oral iron tolerating well.    [SEP 2023- iron sat-4; ferritin- 33]-February 2024 Hb- 12.3.  Vitron-C BID; recommend one extra pill every other day. Hold off IV iron.    # ETIOLOGY: EGD on 08/18/2012; Colonoscopy on 03/10/2018 revealed diverticulosis in the sigmoid colon, in the descending colon, in the transverse colon and in the ascending colon.  There was one 3 mm polyp at the hepatic flexure (tubular adenoma) and two 4 to 6 mm polyps (hyperplastic) in the sigmoid colon.   # B12 deficiency: B12 pill once a week; Feb 2023- WNL.   # A-fib/CHF- on Eliquis.? Plavix [per cardiology]  # DISPOSITION: # follow up in 6 months- MD; 2-3 days prior-labs- cbc/cmp/B12 levels; Iron studies/ferritin; possible venofer - Dr.B

## 2022-07-31 ENCOUNTER — Other Ambulatory Visit: Payer: Self-pay | Admitting: Family Medicine

## 2022-07-31 ENCOUNTER — Encounter: Payer: Self-pay | Admitting: Family Medicine

## 2022-07-31 ENCOUNTER — Ambulatory Visit (INDEPENDENT_AMBULATORY_CARE_PROVIDER_SITE_OTHER): Payer: PPO | Admitting: Family Medicine

## 2022-07-31 VITALS — BP 122/74 | HR 84 | Ht 64.0 in | Wt 209.0 lb

## 2022-07-31 DIAGNOSIS — E785 Hyperlipidemia, unspecified: Secondary | ICD-10-CM

## 2022-07-31 DIAGNOSIS — I5020 Unspecified systolic (congestive) heart failure: Secondary | ICD-10-CM

## 2022-07-31 DIAGNOSIS — I4819 Other persistent atrial fibrillation: Secondary | ICD-10-CM | POA: Insufficient documentation

## 2022-07-31 DIAGNOSIS — Z7689 Persons encountering health services in other specified circumstances: Secondary | ICD-10-CM | POA: Diagnosis not present

## 2022-07-31 DIAGNOSIS — I6521 Occlusion and stenosis of right carotid artery: Secondary | ICD-10-CM

## 2022-07-31 DIAGNOSIS — I6930 Unspecified sequelae of cerebral infarction: Secondary | ICD-10-CM

## 2022-07-31 DIAGNOSIS — I10 Essential (primary) hypertension: Secondary | ICD-10-CM | POA: Diagnosis not present

## 2022-07-31 DIAGNOSIS — E559 Vitamin D deficiency, unspecified: Secondary | ICD-10-CM

## 2022-07-31 DIAGNOSIS — Z0000 Encounter for general adult medical examination without abnormal findings: Secondary | ICD-10-CM

## 2022-07-31 DIAGNOSIS — R7309 Other abnormal glucose: Secondary | ICD-10-CM

## 2022-07-31 DIAGNOSIS — K219 Gastro-esophageal reflux disease without esophagitis: Secondary | ICD-10-CM | POA: Diagnosis not present

## 2022-07-31 MED ORDER — METOPROLOL TARTRATE 50 MG PO TABS: 50.0000 mg | ORAL_TABLET | Freq: Two times a day (BID) | ORAL | 3 refills | Status: DC

## 2022-07-31 MED ORDER — SIMVASTATIN 20 MG PO TABS: ORAL_TABLET | ORAL | 3 refills | Status: DC

## 2022-07-31 MED ORDER — LOSARTAN POTASSIUM 25 MG PO TABS: 25.0000 mg | ORAL_TABLET | Freq: Every day | ORAL | 3 refills | Status: AC

## 2022-07-31 NOTE — Progress Notes (Signed)
Subjective:    Patient ID: Ellen Baker, female    DOB: [birth year], 72 y.o.   MRN: [id number]  Ellen Baker is a 72 y.o. female presenting on 07/31/2022 for Establish Care   Transfer care from prior PCP Dr Ronnald Ramp to our office [facility]   HPI   History of Stroke CVA with residual deficits  Hospitalization 12/2022 due to acute stroke, she had difficulty functioning with weakness in hands S/p Endarterctomy Right side - vascular Dr Bridgett Larsson - 01/2013 She has had some chronic residual deficits with some vision reduction and balance issues, and occasional weaker grip and upper extremity at times, but non focal. She also has arthritis of hands. On Plavix  Followed by [facility] yearly  CHRONIC HTN: Reports history of ACEi cough, switched from Lisinopril to Losartan Followed by [facility] Cardiology Current Meds - Losartan 37m   Reports good compliance, took meds today. Tolerating well, w/o complaints. Denies CP, dyspnea, HA, edema, dizziness / lightheadedness  CHF, HFrEF Atrial Fibrillation persistent [facility] C[facility]ayborn BignessOn Amiodarone and Metoprolol Admits fatigue Last ECHOCardiogram 04/16/22 On Eliquis 513mTWICE A DAY On Plavix  Hyperlipidemia Managed after CVA Was on Simvastatin. Needs re order  Supposed to have Cataracts surgery in October 2023. She has bad cough and drainage and it improved, still residual, couldn't lay flat with drainage. She saw Dr JuKathyrn SheriffNT Flonase  Anemia [facility] Prior chronic history anemia and fatigue Has followed with Hematology now w/ improvement  Health Maintenance: Pending, will review at next visit     07/31/2022   10:03 AM 07/23/2022   10:09 AM 04/09/2022    4:15 PM  Depression screen PHQ 2/9  Decreased Interest 0 0 0  Down, Depressed, Hopeless 0 0 0  PHQ - 2 Score 0 0 0  Altered sleeping 0 0 0  Tired, decreased energy 0 0 0  Change in appetite 0 0 0  Feeling bad or failure about  yourself  0 0 0  Trouble concentrating 0 0 0  Moving slowly or fidgety/restless 0 0 0  Suicidal thoughts 0 0 0  PHQ-9 Score 0 0 0  Difficult doing work/chores Not difficult at all Not difficult at all Not difficult at all    Past Medical History:  Diagnosis Date   Arthritis    right knee   Atrial fibrillation (HCC)    Carotid artery occlusion    Dizziness    GERD (gastroesophageal reflux disease)    Heart murmur    History of colon polyps    Hyperlipidemia    Hypertension    Iron deficiency    [place]   no residual effects   Past Surgical History:  Procedure Laterality Date   CAROTID ENDARTERECTOMY     RIGHT  01/19/13   COLONOSCOPY  08-18-12   COLONOSCOPY WITH PROPOFOL N/A 03/10/2018   Procedure: COLONOSCOPY WITH PROPOFOL;  Surgeon: SkLollie SailsMD;  [facility];  Service: Endoscopy;  Laterality: N/A;   DILATION AND CURETTAGE OF UTERUS     ENDARTERECTOMY Right 01/19/2013   Procedure: ENDARTERECTOMY CAROTID with patch angioplasty;  Surgeon: BrConrad BurlingtonMD;  [facility] [facility];  Laterality: Right;   ESOPHAGEAL DILATION     JOINT REPLACEMENT Left 04/15/2017   taotal knee arthorplasty Right 2021   TOTAL KNEE ARTHROPLASTY Right 12/01/2019   Procedure: TOTAL KNEE ARTHROPLASTY - RNFA;  Surgeon: PoCorky MullMD;  Location[facility];  Service: Orthopedics;  Laterality: Right;   TUBAL LIGATION     UPPER GI ENDOSCOPY  08-18-12   Social History   Socioeconomic History   Marital status: Widowed    Spouse name: Not on file   Number of children: 3   Years of education: Not on file   Highest education level: 12th grade  Occupational History   Occupation: retired  Tobacco Use   Smoking status: Never   Smokeless tobacco: Never   Tobacco comments:    Smoking cessation materials not required  Vaping Use   Vaping Use: Never used  Substance and Sexual Activity   Alcohol use: Yes    Alcohol/week: 1.0 standard drink of alcohol     Types: 1 Standard drinks or equivalent per week    Comment: Yearly   Drug use: No   Sexual activity: Never  Other Topics Concern   Not on file  Social History Narrative   Pt lives alone.    Social Determinants of Health   Financial Resource Strain: Low Risk  (06/06/2022)   Overall Financial Resource Strain (CARDIA)    Difficulty of Paying Living Expenses: Not hard at all  Food Insecurity: No Food Insecurity (06/06/2022)   Hunger Vital Sign    Worried About Running Out of Food in the Last Year: Never true    Ran Out of Food in the Last Year: Never true  Transportation Needs: No Transportation Needs (06/06/2022)   PRAPARE - Hydrologist (Medical): No    Lack of Transportation (Non-Medical): No  Physical Activity: Inactive (06/06/2022)   Exercise Vital Sign    Days of Exercise per Week: 0 days    Minutes of Exercise per Session: 0 min  Stress: No Stress Concern Present (06/06/2022)   [place]    Feeling of Stress : Only a little  Social Connections: Moderately Integrated (06/06/2022)   Social Connection and Isolation Panel [NHANES]    Frequency of Communication with Friends and Family: More than three times a week    Frequency of Social Gatherings with Friends and Family: More than three times a week    Attends Religious Services: More than 4 times per year    Active Member of [employer] or Organizations: Yes    Attends Archivist Meetings: More than 4 times per year    Marital Status: Widowed  Intimate Partner Violence: Not At Risk (06/06/2022)   Humiliation, Afraid, Rape, and Kick questionnaire    Fear of Current or Ex-Partner: No    Emotionally Abused: No    Physically Abused: No    Sexually Abused: No   Family History  Problem Relation Age of Onset   Stroke Mother    Deep vein thrombosis Mother    Heart disease Mother        Amputation-   Hypertension Mother     Alzheimer's disease Mother    Heart failure Father    Heart disease Father        CHF   Hypertension Father    Varicose Veins Father    Parkinson's disease Father    Breast cancer Paternal Aunt 64   Current Outpatient Medications on File Prior to Visit  Medication Sig   alendronate (FOSAMAX) 70 MG tablet TAKE 1 TABLET(70 MG) BY MOUTH 1 TIME A WEEK WITH A FULL GLASS OF WATER AND ON AN EMPTY STOMACH   apixaban (ELIQUIS) 5 MG TABS tablet Take 5 mg  by mouth 2 (two) times daily.   Calcium Carbonate-Vitamin D3 600-400 MG-UNIT TABS Take 2 tablets by mouth daily.   clopidogrel (PLAVIX) 75 MG tablet TAKE 1 TABLET BY MOUTH DAILY   famotidine (PEPCID) 20 MG tablet TAKE 1 TABLET(20 MG) BY MOUTH TWICE DAILY   furosemide (LASIX) 20 MG tablet Take 1 tablet (20 mg total) by mouth daily. (Patient taking differently: Take 20 mg by mouth 2 (two) times daily.)   ibuprofen (ADVIL) 200 MG tablet Take by mouth.   Iron-Vitamin C 65-125 MG TABS Take 1 tablet by mouth 2 (two) times daily. (Patient taking differently: Take 1 tablet by mouth once a week.)   loratadine (CLARITIN) 10 MG tablet Take 1 tablet (10 mg total) by mouth continuous as needed for allergies.   losartan (COZAAR) 50 MG tablet Take 50 mg by mouth daily.   meclizine (ANTIVERT) 25 MG tablet Take 1 tablet (25 mg total) by mouth 3 (three) times daily as needed for dizziness.   metoprolol tartrate (LOPRESSOR) 25 MG tablet Take 25 mg by mouth 2 (two) times daily.   metoprolol tartrate (LOPRESSOR) 50 MG tablet Take 1 tablet by mouth 2 (two) times daily.   Multiple Vitamins-Minerals (CENTRUM SILVER ULTRA WOMENS) TABS Take 1 tablet by mouth daily.   vitamin B-12 (CYANOCOBALAMIN) 1000 MCG tablet Take 1,000 mcg by mouth once a week.   amiodarone (PACERONE) 200 MG tablet Take by mouth. (Patient not taking: Reported on 07/31/2022)   No current facility-administered medications on file prior to visit.    Review of Systems Per HPI unless specifically  indicated above      Objective:    BP 122/74   Pulse 84   Ht 5' 4"$  (1.626 m)   Wt 209 lb (94.8 kg)   SpO2 98%   BMI 35.87 kg/m   Wt Readings from Last 3 Encounters:  07/31/22 209 lb (94.8 kg)  07/25/22 205 lb 4.8 oz (93.1 kg)  07/23/22 208 lb (94.3 kg)    Physical Exam Vitals and nursing note reviewed.  Constitutional:      General: She is not in acute distress.    Appearance: She is well-developed. She is not diaphoretic.     Comments: Well-appearing, comfortable, cooperative  HENT:     Head: Normocephalic and atraumatic.  Eyes:     General:        Right eye: No discharge.        Left eye: No discharge.     Conjunctiva/sclera: Conjunctivae normal.  Neck:     Thyroid: No thyromegaly.  Cardiovascular:     Rate and Rhythm: Normal rate. Rhythm irregular.     Heart sounds: Normal heart sounds. No murmur heard. Pulmonary:     Effort: Pulmonary effort is normal. No respiratory distress.     Breath sounds: Normal breath sounds. No wheezing or rales.  Musculoskeletal:        General: Normal range of motion.     Cervical back: Normal range of motion and neck supple.     Right lower leg: Edema present.     Left lower leg: Edema present.  Lymphadenopathy:     Cervical: No cervical adenopathy.  Skin:    General: Skin is warm and dry.     Findings: No erythema or rash.  Neurological:     Mental Status: She is alert and oriented to person, place, and time.  Psychiatric:        Behavior: Behavior normal.     Comments: Well  groomed, good eye contact, normal speech and thoughts      I have personally reviewed the radiology report from 04/16/22 ECHOcardiogram.  Echo complete  Anatomical Region Laterality Modality  -- -- Ultrasound   Narrative                      CARDIOLOGY DEPARTMENT                    [facility]                                    [id number]           [place] #: [id number]            [place], [place], [place] [address]       Date: 04/16/2022 07: 55 AM                                                              Adult   Female   Age: 42 yrs           ECHOCARDIOGRAM REPORT                              Outpatient                                                              [facility]      STUDY:CHEST WALL               TAPE:                    MD1: CALLWOOD, DWAYNE DENNIS       ECHO:Yes    DOPPLER:Yes       FILE:                    BP: 124/76 mmHg      COLOR:Yes   CONTRAST:No     MACHINE:Philips  RV BIOPSY:No          3D:No  SOUND QLTY:Moderate            Height: 63 in     MEDIUM:None                                              Weight: 211 lb  BSA: 2.0 m2 _________________________________________________________________________________________               HISTORY: DOE                REASON: Assess, LV function            INDICATION: SOB (shortness of breath) [R06.02 (ICD-10-CM)] _________________________________________________________________________________________ ECHOCARDIOGRAPHIC MEASUREMENTS 2D DIMENSIONS AORTA                  Values   Normal Range   MAIN PA         Values    Normal Range               Annulus: 1.7 cm       [2.1-2.5]         PA Main: nm*       [1.5-2.1]             Aorta Sin: 2.6 cm       [2.7-3.3]    RIGHT VENTRICLE           ST Junction: nm*          [2.3-2.9]         RV Base: nm*       [<4.2]             Asc.Aorta: 3.1 cm       [2.3-3.1]          RV Mid: 4.0 cm    [< 3.5] LEFT VENTRICLE                                      RV Length: nm*       [<8.6]                 LVIDd: 4.8 cm       [3.9-5.3]    INFERIOR VENA CAVA                 LVIDs: 4.1 cm                        Max. IVC: nm*       [<=2.1]                    FS: 14.8 %       [>25]            Min. IVC: nm*                   SWT: 1.0 cm       [0.5-0.9]    ------------------                   PWT: 1.0 cm        [0.5-0.9]    nm* - not measured LEFT ATRIUM               LA Diam: 4.6 cm       [2.7-3.8]           LA A4C Area: nm*          [<20]             LA Volume: nm*          [22-52] _________________________________________________________________________________________ ECHOCARDIOGRAPHIC DESCRIPTIONS AORTIC ROOT                  Size: Normal  Dissection: INDETERM FOR DISSECTION AORTIC VALVE              Leaflets: Tricuspid                   Morphology: Normal              Mobility: Fully mobile LEFT VENTRICLE                  Size: Normal                        Anterior: HYPOCONTRACTILE           Contraction: MOD GLOBAL DECREASE            Lateral: HYPOCONTRACTILE            Closest EF: 30% (Estimated)                 Septal: HYPOCONTRACTILE             LV Masses: No Masses                       Apical: HYPOCONTRACTILE                   LVH: None                          Inferior: HYPOCONTRACTILE                                                     Posterior: HYPOCONTRACTILE          Dias.FxClass: N/A MITRAL VALVE              Leaflets: Normal                        Mobility: Fully mobile            Morphology: Normal LEFT ATRIUM                  Size: MODERATELY ENLARGED          LA Masses: No masses             IA Septum: Normal IAS MAIN PA                  Size: Normal PULMONIC VALVE            Morphology: Normal                        Mobility: Fully mobile RIGHT VENTRICLE             RV Masses: No Masses                         Size: MILDLY ENLARGED             Free Wall: Normal                     Contraction: MILD GLOBAL DECREASE TRICUSPID VALVE              Leaflets: Normal  Mobility: Fully mobile            Morphology: Normal RIGHT ATRIUM                  Size: Normal                        RA Other: None               RA Mass: No masses PERICARDIUM                 Fluid: No effusion INFERIOR VENACAVA                  Size: Normal ABNORMAL  RESPIRATORY COLLAPSE _________________________________________________________________________________________  DOPPLER ECHO and OTHER SPECIAL PROCEDURES                Aortic: No AR                      No AS                        168.0 cm/sec peak vel      11.3 mmHg peak grad                Mitral: MODERATE MR                No MS                        MV Inflow E Vel = 126.0 cm/sec      MV Annulus E'Vel = 6.8 cm/sec                        E/E'Ratio = 18.5             Tricuspid: MILD TR                    No TS                        280.0 cm/sec peak TR vel   34.4 mmHg peak RV pressure             Pulmonary: MILD PR                    No PS                        106.0 cm/sec peak vel      4.5 mmHg peak grad _________________________________________________________________________________________ INTERPRETATION MODERATE LV SYSTOLIC DYSFUNCTION (See above) MILD RV SYSTOLIC DYSFUNCTION (See above) NO VALVULAR STENOSIS MODERATE to SEVERE MR MILD TR, PR EF 30% _________________________________________________________________________________________ Electronically signed by    Lujean Amel, MD on 04/17/2022 11: 03 AM          Performed By: Maurilio Lovely, [place]    Ordering Physician: Lujean Amel _________________________________________________________________________________________ Procedure Note  Jobe Gibbon, MD - 04/17/2022 Formatting of this note might be different from the original.                     [place], [facility]  [id number]           [place] #: [id number]           [place], [place], [place] [address]       Date: 04/16/2022 07: 90 AM                                                              Adult   Female   Age: 14 yrs           ECHOCARDIOGRAM REPORT                              Outpatient                                                               [facility]      STUDY:CHEST WALL               TAPE:                    MD1: CALLWOOD, DWAYNE DENNIS       ECHO:Yes    DOPPLER:Yes       FILE:                    BP: 124/76 mmHg      COLOR:Yes   CONTRAST:No     MACHINE:Philips  RV BIOPSY:No          3D:No  SOUND QLTY:Moderate            Height: 63 in     MEDIUM:None                                              Weight: 211 lb                                                              BSA: 2.0 m2 _________________________________________________________________________________________               HISTORY: DOE                REASON: Assess, LV function            INDICATION: SOB (shortness of breath) [R06.02 (ICD-10-CM)] _________________________________________________________________________________________ ECHOCARDIOGRAPHIC MEASUREMENTS 2D DIMENSIONS AORTA                  Values   Normal Range   MAIN PA         Values    Normal Range  Annulus: 1.7 cm       [2.1-2.5]         PA Main: nm*       [1.5-2.1]             Aorta Sin: 2.6 cm       [2.7-3.3]    RIGHT VENTRICLE           ST Junction: nm*          [2.3-2.9]         RV Base: nm*       [<4.2]             Asc.Aorta: 3.1 cm       [2.3-3.1]          RV Mid: 4.0 cm    [< 3.5] LEFT VENTRICLE                                      RV Length: nm*       [<8.6]                 LVIDd: 4.8 cm       [3.9-5.3]    INFERIOR VENA CAVA                 LVIDs: 4.1 cm                        Max. IVC: nm*       [<=2.1]                    FS: 14.8 %       [>25]            Min. IVC: nm*                   SWT: 1.0 cm       [0.5-0.9]    ------------------                   PWT: 1.0 cm       [0.5-0.9]    nm* - not measured LEFT ATRIUM               LA Diam: 4.6 cm       [2.7-3.8]           LA A4C Area: nm*          [<20]             LA Volume: nm*           [22-52] _________________________________________________________________________________________ ECHOCARDIOGRAPHIC DESCRIPTIONS AORTIC ROOT                  Size: Normal            Dissection: INDETERM FOR DISSECTION AORTIC VALVE              Leaflets: Tricuspid                   Morphology: Normal              Mobility: Fully mobile LEFT VENTRICLE                  Size: Normal                        Anterior: HYPOCONTRACTILE  Contraction: MOD GLOBAL DECREASE            Lateral: HYPOCONTRACTILE            Closest EF: 30% (Estimated)                 Septal: HYPOCONTRACTILE             LV Masses: No Masses                       Apical: HYPOCONTRACTILE                   LVH: None                          Inferior: HYPOCONTRACTILE                                                     Posterior: HYPOCONTRACTILE          Dias.FxClass: N/A MITRAL VALVE              Leaflets: Normal                        Mobility: Fully mobile            Morphology: Normal LEFT ATRIUM                  Size: MODERATELY ENLARGED          LA Masses: No masses             IA Septum: Normal IAS MAIN PA                  Size: Normal PULMONIC VALVE            Morphology: Normal                        Mobility: Fully mobile RIGHT VENTRICLE             RV Masses: No Masses                         Size: MILDLY ENLARGED             Free Wall: Normal                     Contraction: MILD GLOBAL DECREASE TRICUSPID VALVE              Leaflets: Normal                        Mobility: Fully mobile            Morphology: Normal RIGHT ATRIUM                  Size: Normal                        RA Other: None               RA Mass: No masses PERICARDIUM                 Fluid: No effusion INFERIOR VENACAVA  Size: Normal ABNORMAL RESPIRATORY COLLAPSE _________________________________________________________________________________________  DOPPLER ECHO and OTHER SPECIAL PROCEDURES                 Aortic: No AR                      No AS                        168.0 cm/sec peak vel      11.3 mmHg peak grad                Mitral: MODERATE MR                No MS                        MV Inflow E Vel = 126.0 cm/sec      MV Annulus E'Vel = 6.8 cm/sec                        E/E'Ratio = 18.5             Tricuspid: MILD TR                    No TS                        280.0 cm/sec peak TR vel   34.4 mmHg peak RV pressure             Pulmonary: MILD PR                    No PS                        106.0 cm/sec peak vel      4.5 mmHg peak grad _________________________________________________________________________________________ INTERPRETATION MODERATE LV SYSTOLIC DYSFUNCTION (See above) MILD RV SYSTOLIC DYSFUNCTION (See above) NO VALVULAR STENOSIS MODERATE to SEVERE MR MILD TR, PR EF 30% _________________________________________________________________________________________ Electronically signed by    Lujean Amel, MD on 04/17/2022 11: 03 AM          Performed By: Maurilio Lovely, RDCS    Ordering Physician: Lujean Amel _________________________________________________________________________________________ All Measurements  Exam End: 04/16/22 09:01   Specimen Collected: 04/16/22 07:56 Last Resulted: 04/17/22 11:03  Received From: [facility]  Result Received: 06/06/22 11:19     Results for orders placed or performed in visit on 07/25/22  CBC  Result Value Ref Range   WBC 8.5 4.0 - 10.5 K/uL   RBC 4.24 3.87 - 5.11 MIL/uL   Hemoglobin 12.8 12.0 - 15.0 g/dL   HCT 40.1 36.0 - 46.0 %   MCV 94.6 80.0 - 100.0 fL   MCH 30.2 26.0 - 34.0 pg   MCHC 31.9 30.0 - 36.0 g/dL   RDW 14.7 11.5 - 15.5 %   Platelets 270 150 - 400 K/uL   nRBC 0.0 0.0 - 0.2 %  Ferritin  Result Value Ref Range   Ferritin 20 11 - 307 ng/mL  Basic metabolic panel  Result Value Ref Range   Sodium 139 135 - 145 mmol/L   Potassium 3.9 3.5 - 5.1 mmol/L   Chloride 99 98 - 111  mmol/L   CO2 30 22 - 32 mmol/L   Glucose, Bld 107 (H) 70 - 99 mg/dL   BUN  14 8 - 23 mg/dL   Creatinine, Ser 0.99 0.44 - 1.00 mg/dL   Calcium 8.5 (L) 8.9 - 10.3 mg/dL   GFR, Estimated >60 >60 mL/min   Anion gap 10 5 - 15  Vitamin B12  Result Value Ref Range   Vitamin B-12 400 180 - 914 pg/mL  Iron and TIBC([facility])  Result Value Ref Range   Iron 59 28 - 170 ug/dL   TIBC 535 (H) 250 - 450 ug/dL   Saturation Ratios 11 10.4 - 31.8 %   UIBC 476 ug/dL      Assessment & Plan:   Problem List Items Addressed This Visit     Carotid artery stenosis, symptomatic   Relevant Medications   losartan (COZAAR) 50 MG tablet   simvastatin (ZOCOR) 20 MG tablet   Dyslipidemia   Relevant Medications   simvastatin (ZOCOR) 20 MG tablet   Esophageal reflux   Essential hypertension   Relevant Medications   losartan (COZAAR) 50 MG tablet   simvastatin (ZOCOR) 20 MG tablet   History of stroke with residual effects   Persistent atrial fibrillation (HCC) - Primary   Relevant Medications   losartan (COZAAR) 50 MG tablet   simvastatin (ZOCOR) 20 MG tablet   Other Visit Diagnoses     Encounter to establish care with new doctor          Establish Care Review outside records and other specialist prior PCP  Persistent AFib History of CVA w/ residual deficits HYPERTENSION HLD HFrEF / Cardiomyopathy Followed by Cardiology Jefm [facility], Dr Clayborn Bigness)  Some difficulty with med rec today, reviewed records and called Dr Clayborn Bigness, spoke with him directly she should be on Losartan 69m daily and Metoprolol tartrate 581mTWICE A DAY. - She was out of Losartan 50 for 2 months - Also had Metoprolol 25 + 504mwice a day - Dr CalClayborn Bignesss good with lower dose ARB as her BP is controlled off ARB, HFrEF should be on ARB  Restart Losartan lower dose 79m58mily since BP controlled. She has history cardiomyopathy some reduced LVEF, on ACEi ARB recommended Re order Metoprolol Tartrate 50mg58mce a day,  future consider Succinate XL  On Plavix and Eliquis currently.   Meds ordered this encounter  Medications   simvastatin (ZOCOR) 20 MG tablet    Sig: TAKE 1 TABLET(20 MG) BY MOUTH DAILY    Dispense:  90 tablet    Refill:  3      Follow up plan: Return in about 3 months (around 10/29/2022) for 3 month fasting lab only then 1 week later Annual Physical.  Future labs ordered 10/22/22  AlexaNobie PutnamSoKidronp 07/31/2022, 10:20 AM

## 2022-07-31 NOTE — Patient Instructions (Addendum)
Thank you for coming to the office today.  We will coordinate with Dr Etta Quill office for the medications  I have ordered Simvastatin 90 day with refills  We will clarify Losartan and Metoprolol doses. And I can re order.  Keep up the good work!  DUE for FASTING BLOOD WORK (no food or drink after midnight before the lab appointment, only water or coffee without cream/sugar on the morning of)  SCHEDULE "Lab Only" visit in the morning at the clinic for lab draw in 3 MONTHS   - Make sure Lab Only appointment is at about 1 week before your next appointment, so that results will be available  For Lab Results, once available within 2-3 days of blood draw, you can can log in to MyChart online to view your results and a brief explanation. Also, we can discuss results at next follow-up visit.   Please schedule a Follow-up Appointment to: Return in about 3 months (around 10/29/2022) for 3 month fasting lab only then 1 week later Annual Physical.  If you have any other questions or concerns, please feel free to call the office or send a message through [place]. You may also schedule an earlier appointment if necessary.  Additionally, you may be receiving a survey about your experience at our office within a few days to 1 week by e-mail or mail. We value your feedback.  Nobie Putnam, DO [place]

## 2022-08-06 ENCOUNTER — Ambulatory Visit: Payer: PPO | Admitting: Internal Medicine

## 2022-08-06 ENCOUNTER — Other Ambulatory Visit: Payer: PPO

## 2022-08-15 ENCOUNTER — Other Ambulatory Visit: Payer: Self-pay | Admitting: Internal Medicine

## 2022-08-15 DIAGNOSIS — M7120 Synovial cyst of popliteal space [Baker], unspecified knee: Secondary | ICD-10-CM

## 2022-08-15 DIAGNOSIS — I82463 Acute embolism and thrombosis of calf muscular vein, bilateral: Secondary | ICD-10-CM

## 2022-08-18 ENCOUNTER — Ambulatory Visit
Admission: EM | Admit: 2022-08-18 | Discharge: 2022-08-18 | Disposition: A | Discharge status: Home / Self Care | Payer: PPO | Attending: Physician Assistant | Admitting: Physician Assistant

## 2022-08-18 DIAGNOSIS — R238 Other skin changes: Secondary | ICD-10-CM

## 2022-08-18 DIAGNOSIS — L03116 Cellulitis of left lower limb: Secondary | ICD-10-CM | POA: Diagnosis not present

## 2022-08-18 MED ORDER — CEPHALEXIN 500 MG PO CAPS: 500.0000 mg | ORAL_CAPSULE | Freq: Four times a day (QID) | ORAL | 0 refills | Status: AC

## 2022-08-18 NOTE — Discharge Instructions (Addendum)
-  I have drained the fluid-filled sac.  It may fill up a little bit more with fluid.  If it does put gauze down and wrapped with Coban. - You are developing a little bit of an infection so I have sent antibiotic to the pharmacy. - Clean the area with soap and water and apply Neosporin to the anterior part of your lower leg.  Close monitoring.  Follow-up with PCP. - If at any point you feel the redness, swelling or pain worsen or you develop fever, go to ER.

## 2022-08-18 NOTE — ED Provider Notes (Signed)
[facility]    CSN: [place]:[id number] Arrival date & time: 08/18/22  1103      History   Chief Complaint Chief Complaint  Patient presents with   Insect Bite    HPI Ellen Baker is a 72 y.o. female presenting for concerns of a large blister of the left lower leg for the past 4 days.  She believes she could have a spider bite but she never saw a spider or other insect.  She says the area does not hurt.  She says it itches a little.  She reports it has enlarged in size.  On the anterior portion of the left lower leg she has a couple of openings in the skin with weeping.  She reports that she reach out to her doctor's office the other day to inform them what was going on.  She says they ordered an ultrasound to assess for possible DVT although she denies any history of DVT and is currently on Eliquis since she has a history of stroke and A-fib.  She has not had any fevers.  No other concerns.  HPI  Past Medical History:  Diagnosis Date   Arthritis    right knee   Atrial fibrillation (HCC)    Carotid artery occlusion    Dizziness    GERD (gastroesophageal reflux disease)    Heart murmur    History of colon polyps    Hyperlipidemia    Hypertension    Iron deficiency    Stroke ([place]) 12/31/2012   no residual effects    Patient Active Problem List   Diagnosis Date Noted   Persistent atrial fibrillation ([place]) 07/31/2022   HFrEF (heart failure with reduced ejection fraction) ([place]) 07/31/2022   Iron deficiency 02/07/2021   Status post total knee replacement using cement, right 12/01/2019   B12 deficiency 09/24/2018   Normocytic anemia 07/20/2018   Primary osteoarthritis of right knee 02/04/2018   Status post total knee replacement using cement, left 02/04/2018   Obesity (BMI 35.0-39.9 without comorbidity) 01/18/2017   Primary osteoarthritis of left knee 01/18/2017   Cerebral vascular insufficiency 06/06/2015   Dyslipidemia 06/06/2015   Aftercare following surgery  of the circulatory system, NEC 08/03/2013   Carotid artery stenosis, symptomatic 01/02/2013   CVA (cerebral infarction) 12/31/2012   GERD (gastroesophageal reflux disease) 12/31/2012   History of stroke with residual effects 12/31/2012   Esophageal reflux 12/31/2012   Essential hypertension 12/31/2012    Past Surgical History:  Procedure Laterality Date   CAROTID ENDARTERECTOMY     RIGHT  01/19/13   COLONOSCOPY  08/18/2012   COLONOSCOPY WITH PROPOFOL N/A 03/10/2018   Procedure: COLONOSCOPY WITH PROPOFOL;  Surgeon: Lollie Sails, MD;  [facility] END[facility]COPY;  Service: Endoscopy;  Laterality: N/A;   DILATION AND CURETTAGE OF UTERUS     ENDARTERECTOMY Right 01/19/2013   Procedure: ENDARTERECTOMY CAROTID with patch angioplasty;  Surgeon: Conrad [place], MD;  Location: [place];  Service: Vascular;  Laterality: Right;   ESOPHAGEAL DILATION     JOINT REPLACEMENT Left 04/15/2017   Knee   taotal knee arthorplasty Right 2021   TOTAL KNEE ARTHROPLASTY Right 12/01/2019   Procedure: TOTAL KNEE ARTHROPLASTY - RNFA;  Surgeon: Corky Mull, MD;  Location: [facility] ORS;  Service: Orthopedics;  Laterality: Right;   TUBAL LIGATION     UPPER GI ENDOSCOPY  08/18/2012    OB History   No obstetric history on file.      Home Medications    Prior  to Admission medications   Medication Sig Start Date End Date Taking? Authorizing Provider  alendronate (FOSAMAX) 70 MG tablet TAKE 1 TABLET(70 MG) BY MOUTH 1 TIME A WEEK WITH A FULL GLASS OF WATER AND ON AN EMPTY STOMACH 07/23/22  Yes Juline Patch, MD  amiodarone (PACERONE) 200 MG tablet Take by mouth. 04/30/22 06/24/23 Yes [provider]  apixaban (ELIQUIS) 5 MG TABS tablet Take 5 mg by mouth 2 (two) times daily.   Yes [provider]  Calcium Carbonate-Vitamin D3 600-400 MG-UNIT TABS Take 2 tablets by mouth daily.   Yes [provider]  cephALEXin (KEFLEX) 500 MG capsule Take 1 capsule (500 mg total) by mouth 4 (four) times  daily for 7 days. 08/18/22 08/25/22 Yes Danton Clap, PA-C  clopidogrel (PLAVIX) 75 MG tablet TAKE 1 TABLET BY MOUTH DAILY 07/23/22  Yes Juline Patch, MD  famotidine (PEPCID) 20 MG tablet TAKE 1 TABLET(20 MG) BY MOUTH TWICE DAILY 07/23/22  Yes Juline Patch, MD  furosemide (LASIX) 20 MG tablet Take 1 tablet (20 mg total) by mouth daily. Patient taking differently: Take 20 mg by mouth 2 (two) times daily. 07/01/22 07/01/23 Yes Brimage, Vondra, DO  ibuprofen (ADVIL) 200 MG tablet Take by mouth.   Yes [provider]  Iron-Vitamin C 65-125 MG TABS Take 1 tablet by mouth 2 (two) times daily. Patient taking differently: Take 1 tablet by mouth once a week. 07/21/18  Yes Karen Kitchens, NP  loratadine (CLARITIN) 10 MG tablet Take 1 tablet (10 mg total) by mouth continuous as needed for allergies. 03/31/18  Yes Juline Patch, MD  losartan (COZAAR) 25 MG tablet Take 1 tablet (25 mg total) by mouth daily. 07/31/22  Yes Karamalegos, Devonne Doughty, DO  meclizine (ANTIVERT) 25 MG tablet Take 1 tablet (25 mg total) by mouth 3 (three) times daily as needed for dizziness. 01/23/21  Yes Juline Patch, MD  metoprolol tartrate (LOPRESSOR) 50 MG tablet Take 1 tablet (50 mg total) by mouth 2 (two) times daily. 07/31/22 07/31/23 Yes Karamalegos, Devonne Doughty, DO  Multiple Vitamins-Minerals (CENTRUM SILVER ULTRA WOMENS) TABS Take 1 tablet by mouth daily.   Yes [provider]  simvastatin (ZOCOR) 20 MG tablet TAKE 1 TABLET(20 MG) BY MOUTH DAILY 07/31/22  Yes Karamalegos, Devonne Doughty, DO  vitamin B-12 (CYANOCOBALAMIN) 1000 MCG tablet Take 1,000 mcg by mouth once a week.   Yes [provider]    Family History Family History  Problem Relation Age of Onset   Stroke Mother    Deep vein thrombosis Mother    Heart disease Mother        Amputation-   Hypertension Mother    Alzheimer's disease Mother    Heart failure Father    Heart disease Father        CHF   Hypertension Father    Varicose Veins  Father    Parkinson's disease Father    Breast cancer Paternal Aunt 89    Social History Social History   Tobacco Use   Smoking status: Never   Smokeless tobacco: Never   Tobacco comments:    Smoking cessation materials not required  Vaping Use   Vaping Use: Never used  Substance Use Topics   Alcohol use: Yes    Alcohol/week: 1.0 standard drink of alcohol    Types: 1 Standard drinks or equivalent per week    Comment: Yearly   Drug use: No     Allergies   Adhesive [  tape]   Review of Systems Review of Systems  Constitutional:  Negative for fatigue and fever.  Cardiovascular:  Positive for leg swelling.  Musculoskeletal:  Negative for arthralgias and joint swelling.  Skin:  Positive for color change and wound.  Neurological:  Negative for weakness and numbness.     Physical Exam Triage Vital Signs ED Triage Vitals  Enc Vitals Group     BP 08/18/22 1122 (!) 149/91     Pulse Rate 08/18/22 1122 63     Resp --      Temp 08/18/22 1122 (!) 97.5 F (36.4 C)     Temp Source 08/18/22 1122 Oral     SpO2 08/18/22 1122 98 %     Weight 08/18/22 1119 206 lb (93.4 kg)     Height 08/18/22 1119 '5\' 4"'$  (1.626 m)     Head Circumference --      Peak Flow --      Pain Score 08/18/22 1118 0     Pain Loc --      Pain Edu? --      Excl. in [place]? --    No data found.  Updated Vital Signs BP (!) 149/91 (BP Location: Left Arm)   Pulse 63   Temp (!) 97.5 F (36.4 C) (Oral)   Ht '5\' 4"'$  (1.626 m)   Wt 206 lb (93.4 kg)   SpO2 98%   BMI 35.36 kg/m      Physical Exam Vitals and nursing note reviewed.  Constitutional:      General: She is not in acute distress.    Appearance: Normal appearance. She is not ill-appearing or toxic-appearing.  HENT:     Head: Normocephalic and atraumatic.  Eyes:     General: No scleral icterus.       Right eye: No discharge.        Left eye: No discharge.     Conjunctiva/sclera: Conjunctivae normal.  Cardiovascular:     Rate and Rhythm: Normal  rate and regular rhythm.     Heart sounds: Normal heart sounds.  Pulmonary:     Effort: Pulmonary effort is normal. No respiratory distress.     Breath sounds: Normal breath sounds.  Musculoskeletal:     Cervical back: Neck supple.  Skin:    General: Skin is dry.     Comments: Left lower leg:  The first image is of the bulla before I&D and the second picture is after.  The third photograph is of the anterior portion of the left lower leg which shows 2 small wounds with weeping and crusting of the skin.  There is also increased erythema and slight warmth.  She does not have any calf tenderness and no significant difference in size of the left lower leg compared to the right.  Neurological:     General: No focal deficit present.     Mental Status: She is alert. Mental status is at baseline.     Motor: No weakness.     Gait: Gait normal.  Psychiatric:        Mood and Affect: Mood normal.        Behavior: Behavior normal.        Thought Content: Thought content normal.          UC Treatments / Results  Labs (all labs ordered are listed, but only abnormal results are displayed) Labs Reviewed - No data to display  EKG   Radiology No results found.  Procedures Procedures (including critical  care time)  Medications Ordered in UC Medications - No data to display  Initial Impression / Assessment and Plan / UC Course  I have reviewed the triage vital signs and the nursing notes.  Pertinent labs & imaging results that were available during my care of the patient were reviewed by me and considered in my medical decision making (see chart for details).   72 year old female presents for blister of left lower leg for the past several days.  Denies known injury or insect bite/sting.  I have included an image in the chart of patient's bulla which is very large.  She would like to have it drained.  I cleaned the area with alcohol and used an 18-gauge needle to puncture the bulla which  drained serous fluid.  I am concerned about the area of erythema of the anterior portion of the leg which appears to be consistent with developing cellulitis.  Will place patient on Keflex.  Advised wound care guidelines.  Advised her to keep the follow-up appointments with her PCP.  Reviewed return and ER precautions.   Final Clinical Impressions(s) / UC Diagnoses   Final diagnoses:  Skin bulla  Cellulitis of leg, left     Discharge Instructions      -I have drained the fluid-filled sac.  It may fill up a little bit more with fluid.  If it does put gauze down and wrapped with Coban. - You are developing a little bit of an infection so I have sent antibiotic to the pharmacy. - Clean the area with soap and water and apply Neosporin to the anterior part of your lower leg.  Close monitoring.  Follow-up with PCP. - If at any point you feel the redness, swelling or pain worsen or you develop fever, go to ER.     ED Prescriptions     Medication Sig Dispense Auth. Provider   cephALEXin (KEFLEX) 500 MG capsule Take 1 capsule (500 mg total) by mouth 4 (four) times daily for 7 days. 28 capsule Danton Clap, PA-C      PDMP not reviewed this encounter.   Danton Clap, PA-C 08/18/22 1154

## 2022-08-18 NOTE — ED Triage Notes (Signed)
Pt c/o possible spider bite x4days  Pt has a fluid sack on the back of the left ankle. Pt states that it was there from Wednesday and started out small but has grown larger in size.

## 2022-08-20 ENCOUNTER — Ambulatory Visit
Admission: RE | Admit: 2022-08-20 | Discharge: 2022-08-20 | Disposition: A | Discharge status: Home / Self Care | Payer: PPO | Source: Ambulatory Visit | Attending: Internal Medicine | Admitting: Internal Medicine

## 2022-08-20 DIAGNOSIS — I82463 Acute embolism and thrombosis of calf muscular vein, bilateral: Secondary | ICD-10-CM | POA: Diagnosis not present

## 2022-08-20 DIAGNOSIS — M7120 Synovial cyst of popliteal space [Baker], unspecified knee: Secondary | ICD-10-CM | POA: Insufficient documentation

## 2022-08-20 DIAGNOSIS — M79604 Pain in right leg: Secondary | ICD-10-CM | POA: Diagnosis not present

## 2022-09-03 DIAGNOSIS — I10 Essential (primary) hypertension: Secondary | ICD-10-CM | POA: Diagnosis not present

## 2022-09-03 DIAGNOSIS — E669 Obesity, unspecified: Secondary | ICD-10-CM | POA: Diagnosis not present

## 2022-09-03 DIAGNOSIS — I429 Cardiomyopathy, unspecified: Secondary | ICD-10-CM | POA: Diagnosis not present

## 2022-09-03 DIAGNOSIS — R0602 Shortness of breath: Secondary | ICD-10-CM | POA: Diagnosis not present

## 2022-09-03 DIAGNOSIS — I4891 Unspecified atrial fibrillation: Secondary | ICD-10-CM | POA: Diagnosis not present

## 2022-09-03 DIAGNOSIS — K219 Gastro-esophageal reflux disease without esophagitis: Secondary | ICD-10-CM | POA: Diagnosis not present

## 2022-09-03 DIAGNOSIS — I5022 Chronic systolic (congestive) heart failure: Secondary | ICD-10-CM | POA: Diagnosis not present

## 2022-09-03 DIAGNOSIS — E785 Hyperlipidemia, unspecified: Secondary | ICD-10-CM | POA: Diagnosis not present

## 2022-09-03 DIAGNOSIS — I679 Cerebrovascular disease, unspecified: Secondary | ICD-10-CM | POA: Diagnosis not present

## 2022-09-09 DIAGNOSIS — K219 Gastro-esophageal reflux disease without esophagitis: Secondary | ICD-10-CM | POA: Diagnosis not present

## 2022-09-09 DIAGNOSIS — S51012A Laceration without foreign body of left elbow, initial encounter: Secondary | ICD-10-CM | POA: Diagnosis not present

## 2022-09-09 DIAGNOSIS — I10 Essential (primary) hypertension: Secondary | ICD-10-CM | POA: Diagnosis not present

## 2022-09-09 DIAGNOSIS — Y9222 Religious institution as the place of occurrence of the external cause: Secondary | ICD-10-CM | POA: Diagnosis not present

## 2022-09-09 DIAGNOSIS — Z7901 Long term (current) use of anticoagulants: Secondary | ICD-10-CM | POA: Diagnosis not present

## 2022-09-09 DIAGNOSIS — Q401 Congenital hiatus hernia: Secondary | ICD-10-CM | POA: Diagnosis not present

## 2022-09-09 DIAGNOSIS — I110 Hypertensive heart disease with heart failure: Secondary | ICD-10-CM | POA: Diagnosis not present

## 2022-09-09 DIAGNOSIS — Z79899 Other long term (current) drug therapy: Secondary | ICD-10-CM | POA: Diagnosis not present

## 2022-09-09 DIAGNOSIS — E785 Hyperlipidemia, unspecified: Secondary | ICD-10-CM | POA: Diagnosis not present

## 2022-09-09 DIAGNOSIS — S8262XA Displaced fracture of lateral malleolus of left fibula, initial encounter for closed fracture: Secondary | ICD-10-CM | POA: Diagnosis not present

## 2022-09-09 DIAGNOSIS — S0003XA Contusion of scalp, initial encounter: Secondary | ICD-10-CM | POA: Diagnosis not present

## 2022-09-09 DIAGNOSIS — S299XXA Unspecified injury of thorax, initial encounter: Secondary | ICD-10-CM | POA: Diagnosis not present

## 2022-09-09 DIAGNOSIS — S59912A Unspecified injury of left forearm, initial encounter: Secondary | ICD-10-CM | POA: Diagnosis not present

## 2022-09-09 DIAGNOSIS — Z91048 Other nonmedicinal substance allergy status: Secondary | ICD-10-CM | POA: Diagnosis not present

## 2022-09-09 DIAGNOSIS — S199XXA Unspecified injury of neck, initial encounter: Secondary | ICD-10-CM | POA: Diagnosis not present

## 2022-09-09 DIAGNOSIS — S300XXA Contusion of lower back and pelvis, initial encounter: Secondary | ICD-10-CM | POA: Diagnosis not present

## 2022-09-09 DIAGNOSIS — S60812A Abrasion of left wrist, initial encounter: Secondary | ICD-10-CM | POA: Diagnosis not present

## 2022-09-09 DIAGNOSIS — S3992XA Unspecified injury of lower back, initial encounter: Secondary | ICD-10-CM | POA: Diagnosis not present

## 2022-09-09 DIAGNOSIS — Z8679 Personal history of other diseases of the circulatory system: Secondary | ICD-10-CM | POA: Diagnosis not present

## 2022-09-09 DIAGNOSIS — M7989 Other specified soft tissue disorders: Secondary | ICD-10-CM | POA: Diagnosis not present

## 2022-09-09 DIAGNOSIS — I482 Chronic atrial fibrillation, unspecified: Secondary | ICD-10-CM | POA: Diagnosis not present

## 2022-09-09 DIAGNOSIS — R55 Syncope and collapse: Secondary | ICD-10-CM | POA: Diagnosis not present

## 2022-09-09 DIAGNOSIS — R918 Other nonspecific abnormal finding of lung field: Secondary | ICD-10-CM | POA: Diagnosis not present

## 2022-09-09 DIAGNOSIS — I350 Nonrheumatic aortic (valve) stenosis: Secondary | ICD-10-CM | POA: Diagnosis not present

## 2022-09-09 DIAGNOSIS — Z8673 Personal history of transient ischemic attack (TIA), and cerebral infarction without residual deficits: Secondary | ICD-10-CM | POA: Diagnosis not present

## 2022-09-09 DIAGNOSIS — S0101XA Laceration without foreign body of scalp, initial encounter: Secondary | ICD-10-CM | POA: Diagnosis not present

## 2022-09-09 DIAGNOSIS — Z7983 Long term (current) use of bisphosphonates: Secondary | ICD-10-CM | POA: Diagnosis not present

## 2022-09-09 DIAGNOSIS — S8263XA Displaced fracture of lateral malleolus of unspecified fibula, initial encounter for closed fracture: Secondary | ICD-10-CM | POA: Diagnosis not present

## 2022-09-09 DIAGNOSIS — Z7902 Long term (current) use of antithrombotics/antiplatelets: Secondary | ICD-10-CM | POA: Diagnosis not present

## 2022-09-09 DIAGNOSIS — I509 Heart failure, unspecified: Secondary | ICD-10-CM | POA: Diagnosis not present

## 2022-09-09 DIAGNOSIS — R000 Tachycardia, unspecified: Secondary | ICD-10-CM | POA: Diagnosis not present

## 2022-09-09 DIAGNOSIS — S0990XA Unspecified injury of head, initial encounter: Secondary | ICD-10-CM | POA: Diagnosis not present

## 2022-09-09 DIAGNOSIS — I959 Hypotension, unspecified: Secondary | ICD-10-CM | POA: Diagnosis not present

## 2022-09-09 DIAGNOSIS — W010XXA Fall on same level from slipping, tripping and stumbling without subsequent striking against object, initial encounter: Secondary | ICD-10-CM | POA: Diagnosis not present

## 2022-09-09 DIAGNOSIS — S069X9A Unspecified intracranial injury with loss of consciousness of unspecified duration, initial encounter: Secondary | ICD-10-CM | POA: Diagnosis not present

## 2022-09-09 DIAGNOSIS — S92132A Displaced fracture of posterior process of left talus, initial encounter for closed fracture: Secondary | ICD-10-CM | POA: Diagnosis not present

## 2022-09-09 DIAGNOSIS — W19XXXA Unspecified fall, initial encounter: Secondary | ICD-10-CM | POA: Diagnosis not present

## 2022-09-09 DIAGNOSIS — W108XXA Fall (on) (from) other stairs and steps, initial encounter: Secondary | ICD-10-CM | POA: Diagnosis not present

## 2022-09-09 DIAGNOSIS — W109XXA Fall (on) (from) unspecified stairs and steps, initial encounter: Secondary | ICD-10-CM | POA: Diagnosis not present

## 2022-09-09 DIAGNOSIS — K449 Diaphragmatic hernia without obstruction or gangrene: Secondary | ICD-10-CM | POA: Diagnosis not present

## 2022-09-09 DIAGNOSIS — S79912A Unspecified injury of left hip, initial encounter: Secondary | ICD-10-CM | POA: Diagnosis not present

## 2022-09-09 DIAGNOSIS — S0181XA Laceration without foreign body of other part of head, initial encounter: Secondary | ICD-10-CM | POA: Diagnosis not present

## 2022-09-09 DIAGNOSIS — I4581 Long QT syndrome: Secondary | ICD-10-CM | POA: Diagnosis not present

## 2022-09-09 DIAGNOSIS — I4891 Unspecified atrial fibrillation: Secondary | ICD-10-CM | POA: Diagnosis not present

## 2022-09-09 DIAGNOSIS — I517 Cardiomegaly: Secondary | ICD-10-CM | POA: Diagnosis not present

## 2022-09-09 DIAGNOSIS — D62 Acute posthemorrhagic anemia: Secondary | ICD-10-CM | POA: Diagnosis not present

## 2022-09-17 ENCOUNTER — Telehealth: Payer: Self-pay | Admitting: *Deleted

## 2022-09-17 DIAGNOSIS — Z96653 Presence of artificial knee joint, bilateral: Secondary | ICD-10-CM | POA: Diagnosis not present

## 2022-09-17 DIAGNOSIS — S91302D Unspecified open wound, left foot, subsequent encounter: Secondary | ICD-10-CM | POA: Diagnosis not present

## 2022-09-17 DIAGNOSIS — I959 Hypotension, unspecified: Secondary | ICD-10-CM | POA: Diagnosis not present

## 2022-09-17 DIAGNOSIS — Z7983 Long term (current) use of bisphosphonates: Secondary | ICD-10-CM | POA: Diagnosis not present

## 2022-09-17 DIAGNOSIS — Z7902 Long term (current) use of antithrombotics/antiplatelets: Secondary | ICD-10-CM | POA: Diagnosis not present

## 2022-09-17 DIAGNOSIS — E785 Hyperlipidemia, unspecified: Secondary | ICD-10-CM | POA: Diagnosis not present

## 2022-09-17 DIAGNOSIS — Z8673 Personal history of transient ischemic attack (TIA), and cerebral infarction without residual deficits: Secondary | ICD-10-CM | POA: Diagnosis not present

## 2022-09-17 DIAGNOSIS — D62 Acute posthemorrhagic anemia: Secondary | ICD-10-CM | POA: Diagnosis not present

## 2022-09-17 DIAGNOSIS — K219 Gastro-esophageal reflux disease without esophagitis: Secondary | ICD-10-CM | POA: Diagnosis not present

## 2022-09-17 DIAGNOSIS — E8809 Other disorders of plasma-protein metabolism, not elsewhere classified: Secondary | ICD-10-CM | POA: Diagnosis not present

## 2022-09-17 DIAGNOSIS — I110 Hypertensive heart disease with heart failure: Secondary | ICD-10-CM | POA: Diagnosis not present

## 2022-09-17 DIAGNOSIS — Z9181 History of falling: Secondary | ICD-10-CM | POA: Diagnosis not present

## 2022-09-17 DIAGNOSIS — I350 Nonrheumatic aortic (valve) stenosis: Secondary | ICD-10-CM | POA: Diagnosis not present

## 2022-09-17 DIAGNOSIS — I509 Heart failure, unspecified: Secondary | ICD-10-CM | POA: Diagnosis not present

## 2022-09-17 DIAGNOSIS — M25472 Effusion, left ankle: Secondary | ICD-10-CM | POA: Diagnosis not present

## 2022-09-17 DIAGNOSIS — G9389 Other specified disorders of brain: Secondary | ICD-10-CM | POA: Diagnosis not present

## 2022-09-17 DIAGNOSIS — K449 Diaphragmatic hernia without obstruction or gangrene: Secondary | ICD-10-CM | POA: Diagnosis not present

## 2022-09-17 DIAGNOSIS — R918 Other nonspecific abnormal finding of lung field: Secondary | ICD-10-CM | POA: Diagnosis not present

## 2022-09-17 DIAGNOSIS — Z7901 Long term (current) use of anticoagulants: Secondary | ICD-10-CM | POA: Diagnosis not present

## 2022-09-17 DIAGNOSIS — M47816 Spondylosis without myelopathy or radiculopathy, lumbar region: Secondary | ICD-10-CM | POA: Diagnosis not present

## 2022-09-17 DIAGNOSIS — Z86010 Personal history of colonic polyps: Secondary | ICD-10-CM | POA: Diagnosis not present

## 2022-09-17 DIAGNOSIS — I482 Chronic atrial fibrillation, unspecified: Secondary | ICD-10-CM | POA: Diagnosis not present

## 2022-09-17 DIAGNOSIS — S8262XD Displaced fracture of lateral malleolus of left fibula, subsequent encounter for closed fracture with routine healing: Secondary | ICD-10-CM | POA: Diagnosis not present

## 2022-09-17 NOTE — Transitions of Care (Post Inpatient/ED Visit) (Signed)
   09/17/2022  Name: [name] MRN: [id number] DOB: August 07, 1950  Today's TOC FU Call Status: Today's TOC FU Call Status:: Unsuccessul Call (1st Attempt) Unsuccessful Call (1st Attempt) Date: 09/17/22  Attempted to reach the patient regarding the most recent Inpatient/ED visit.  Follow Up Plan: Additional outreach attempts will be made to reach the patient to complete the Transitions of Care (Post Inpatient/ED visit) call.   [place] [facility] [phone]

## 2022-09-18 ENCOUNTER — Telehealth: Payer: Self-pay | Admitting: *Deleted

## 2022-09-18 ENCOUNTER — Ambulatory Visit (INDEPENDENT_AMBULATORY_CARE_PROVIDER_SITE_OTHER): Payer: PPO | Admitting: Family Medicine

## 2022-09-18 ENCOUNTER — Encounter: Payer: Self-pay | Admitting: Family Medicine

## 2022-09-18 ENCOUNTER — Telehealth: Payer: Self-pay

## 2022-09-18 VITALS — BP 131/64 | HR 75 | Temp 96.4°F | Wt 216.0 lb

## 2022-09-18 DIAGNOSIS — S80822D Blister (nonthermal), left lower leg, subsequent encounter: Secondary | ICD-10-CM | POA: Diagnosis not present

## 2022-09-18 DIAGNOSIS — S80812A Abrasion, left lower leg, initial encounter: Secondary | ICD-10-CM

## 2022-09-18 DIAGNOSIS — I10 Essential (primary) hypertension: Secondary | ICD-10-CM | POA: Diagnosis not present

## 2022-09-18 DIAGNOSIS — S82892A Other fracture of left lower leg, initial encounter for closed fracture: Secondary | ICD-10-CM | POA: Diagnosis not present

## 2022-09-18 DIAGNOSIS — R55 Syncope and collapse: Secondary | ICD-10-CM

## 2022-09-18 DIAGNOSIS — R600 Localized edema: Secondary | ICD-10-CM | POA: Diagnosis not present

## 2022-09-18 MED ORDER — DOXYCYCLINE HYCLATE 100 MG PO TABS: 100.0000 mg | ORAL_TABLET | Freq: Two times a day (BID) | ORAL | 0 refills | Status: DC

## 2022-09-18 MED ORDER — MUPIROCIN 2 % EX OINT: 1.0000 | TOPICAL_OINTMENT | Freq: Two times a day (BID) | CUTANEOUS | 0 refills | Status: DC

## 2022-09-18 NOTE — Progress Notes (Signed)
Subjective:    Patient ID: Ellen Baker, female    DOB: 27-Apr-1951, 72 y.o.   MRN: [id number]  Reliance is a 72 y.o. female presenting on 09/18/2022 for No chief complaint on file.   HPI  HOSPITAL FOLLOW-UP VISIT  Hospital/Location: [facility] Date of Admission: 09/09/22 Date of Discharge: 09/14/22 Transitions of care telephone call: Not completed attempted 1 x yesterday   Reason for Admission: Syncope, Fall Injury  [facility] H&P and Discharge Summary have been reviewed - Patient presents today 4 days after recent hospitalization. Brief summary of recent course, patient had symptoms of syncopal event and fall injury head injury, hospitalized.  - Today reports overall has ***done well after discharge. Symptoms of *** ***have resolved ***are much improved ***seem to be worsening again.   CHF, HFrEF Atrial Fibrillation persistent Followed by [facility] Cardiology Dr Clayborn Bigness On Amiodarone and Metoprolol Admits fatigue Last [facility]ardiogram 04/16/22 On Eliquis 5mg  TWICE A DAY On Plavix ***she has been off Losartan 50mg  daily and Furosemide 20mg  daily, asking to restart these today.  Anemia Hgb 9 range, now improved to Hgb 10.2, she has upcoming scheduled lab in 4 weeks May 2024 for her annual  Left Ankle Avulsion Fracture CAM Clyde [facility] *** referral to [place] medications on discharge: *** - Changes to current meds on discharge: Discontinued Losartan 50mg  daily and Furosemide 20mg  daily.  I have reviewed the discharge medication list, and have reconciled the current and discharge medications today.   Current Outpatient Medications:    alendronate (FOSAMAX) 70 MG tablet, TAKE 1 TABLET(70 MG) BY MOUTH 1 TIME A WEEK WITH A FULL GLASS OF WATER AND ON AN EMPTY STOMACH, Disp: 12 tablet, Rfl: 1   amiodarone (PACERONE) 200 MG tablet, Take by mouth., Disp: , Rfl:    apixaban (ELIQUIS) 5 MG TABS tablet, Take 5 mg by mouth 2 (two) times daily., Disp: ,  Rfl:    Calcium Carbonate-Vitamin D3 600-400 MG-UNIT TABS, Take 2 tablets by mouth daily., Disp: , Rfl:    clopidogrel (PLAVIX) 75 MG tablet, TAKE 1 TABLET BY MOUTH DAILY, Disp: 90 tablet, Rfl: 1   famotidine (PEPCID) 20 MG tablet, TAKE 1 TABLET(20 MG) BY MOUTH TWICE DAILY, Disp: 180 tablet, Rfl: 1   ibuprofen (ADVIL) 200 MG tablet, Take by mouth., Disp: , Rfl:    Iron-Vitamin C 65-125 MG TABS, Take 1 tablet by mouth 2 (two) times daily. (Patient taking differently: Take 1 tablet by mouth once a week.), Disp: 60 tablet, Rfl: 1   loratadine (CLARITIN) 10 MG tablet, Take 1 tablet (10 mg total) by mouth continuous as needed for allergies., Disp: 30 tablet, Rfl: 6   meclizine (ANTIVERT) 25 MG tablet, Take 1 tablet (25 mg total) by mouth 3 (three) times daily as needed for dizziness., Disp: 30 tablet, Rfl: 0   metoprolol tartrate (LOPRESSOR) 50 MG tablet, Take 1 tablet (50 mg total) by mouth 2 (two) times daily., Disp: 180 tablet, Rfl: 3   Multiple Vitamins-Minerals (CENTRUM SILVER ULTRA WOMENS) TABS, Take 1 tablet by mouth daily., Disp: , Rfl:    simvastatin (ZOCOR) 20 MG tablet, TAKE 1 TABLET(20 MG) BY MOUTH DAILY, Disp: 90 tablet, Rfl: 3   vitamin B-12 (CYANOCOBALAMIN) 1000 MCG tablet, Take 1,000 mcg by mouth once a week., Disp: , Rfl:    furosemide (LASIX) 20 MG tablet, Take 1 tablet (20 mg total) by mouth daily. (Patient not taking: Reported on 09/18/2022), Disp: 30 tablet, Rfl: 0  losartan (COZAAR) 25 MG tablet, Take 1 tablet (25 mg total) by mouth daily. (Patient not taking: Reported on 09/18/2022), Disp: 90 tablet, Rfl: 3  ------------------------------------------------------------------------- Social History   Tobacco Use   Smoking status: Never   Smokeless tobacco: Never   Tobacco comments:    Smoking cessation materials not required  Vaping Use   Vaping Use: Never used  Substance Use Topics   Alcohol use: Yes    Alcohol/week: 1.0 standard drink of alcohol    Types: 1 Standard drinks  or equivalent per week    Comment: Yearly   Drug use: No    Review of Systems Per HPI unless specifically indicated above     Objective:    BP 131/64 (BP Location: Right Arm, Patient Position: Sitting, Cuff Size: Normal)   Pulse 75   Temp (!) 96.4 F (35.8 C) (Temporal)   Wt 216 lb (98 kg)   SpO2 96%   BMI 37.08 kg/m   Wt Readings from Last 3 Encounters:  09/18/22 216 lb (98 kg)  08/18/22 206 lb (93.4 kg)  07/31/22 209 lb (94.8 kg)    Physical Exam  ________________________________________________________ PROCEDURE NOTE Date - 09/18/22 Suture Removal - Left Elbow location - Location / Date Sutures were placed: *** - # of Sutures: *** Discussed benefits and risks (including pain, bleeding, infection, wound separation). Verbal consent given by patient Medication:  None ***< 2 cc Lidocaine 1% with epi Time Out taken Examination of the sutured laceration on *** appears to be well healed. Area cleansed with alcohol wipes. Appropriate # of sutures counted and confirmed. Removal of sutures one by one using sterile pick ups to adjust and sterile scissors to cut one side of suture away from knot. Removal was uncomplicated. - All sutures were successfully removed and wound remains intact - *** out of *** sutures were successfully removed, remaining sutures were left in place due to concern about integrity of the wound and recommend further healing before removal. See A&P.    Results for orders placed or performed in visit on 07/25/22  CBC  Result Value Ref Range   WBC 8.5 4.0 - 10.5 K/uL   RBC 4.24 3.87 - 5.11 MIL/uL   Hemoglobin 12.8 12.0 - 15.0 g/dL   HCT 40.1 36.0 - 46.0 %   MCV 94.6 80.0 - 100.0 fL   MCH 30.2 26.0 - 34.0 pg   MCHC 31.9 30.0 - 36.0 g/dL   RDW 14.7 11.5 - 15.5 %   Platelets 270 150 - 400 K/uL   nRBC 0.0 0.0 - 0.2 %  Ferritin  Result Value Ref Range   Ferritin 20 11 - 307 ng/mL  Basic metabolic panel  Result Value Ref Range   Sodium 139 135 - 145  mmol/L   Potassium 3.9 3.5 - 5.1 mmol/L   Chloride 99 98 - 111 mmol/L   CO2 30 22 - 32 mmol/L   Glucose, Bld 107 (H) 70 - 99 mg/dL   BUN 14 8 - 23 mg/dL   Creatinine, Ser 0.99 0.44 - 1.00 mg/dL   Calcium 8.5 (L) 8.9 - 10.3 mg/dL   GFR, Estimated >60 >60 mL/min   Anion gap 10 5 - 15  Vitamin B12  Result Value Ref Range   Vitamin B-12 400 180 - 914 pg/mL  Iron and TIBC([facility])  Result Value Ref Range   Iron 59 28 - 170 ug/dL   TIBC 535 (H) 250 - 450 ug/dL   Saturation Ratios 11 10.4 -  31.8 %   UIBC 476 ug/dL      Assessment & Plan:   Problem List Items Addressed This Visit   None    No orders of the defined types were placed in this encounter.   Follow up plan: No follow-ups on file.  Nobie Putnam, [facility] 09/18/2022, 2:35 PM

## 2022-09-18 NOTE — Transitions of Care (Post Inpatient/ED Visit) (Signed)
   09/18/2022  Name: Ellen Baker MRN: [id number] DOB: 1951-06-18  Today's TOC FU Call Status: Today's TOC FU Call Status:: Unsuccessful Call (2nd Attempt) Unsuccessful Call (2nd Attempt) Date: 09/18/22  Attempted to reach the patient regarding the most recent Inpatient/ED visit. Patient was working with physical therapy and will call me back.   Follow Up Plan: Additional outreach attempts will be made to reach the patient to complete the Transitions of Care (Post Inpatient/ED visit) call.   [facility] [phone]

## 2022-09-18 NOTE — Telephone Encounter (Signed)
Copied from [place] [id number]. Topic: Quick Communication - Home Health Verbal Orders >> Sep 18, 2022  2:04 PM Ellen Baker wrote: Caller/Agency: [place] Number: [phone] Requesting OT/PT/Skilled Nursing/Social Work/Speech Therapy: PT Frequency: 1w1,2w1,1w6  Reporting drug interactions between medication apixaban (ELIQUIS) 5 MG TABS tablet, clopidogrel (PLAVIX) 75 MG tablet and ibuprofen.

## 2022-09-18 NOTE — Patient Instructions (Addendum)
Thank you for coming to the office today.  Suture removed from elbow  Swelling on legs is preventing the skin sore from healing  Use Topical Mupirocin ointment twice a day for 1-2 weeks to help heal  Restart Furosemide 20mg  daily for fluid swelling, elevation as well  Restart Losartan  Start taking Doxycycline antibiotic 100mg  twice daily for 10 days. Take with full glass of water and stay upright for at least 30 min after taking, may be seated or standing, but should NOT lay down. This is just a safety precaution, if this medicine does not go all the way down throat well it could cause some burning discomfort to throat and esophagus.   Please schedule a Follow-up Appointment to: Return if symptoms worsen or fail to improve.  If you have any other questions or concerns, please feel free to call the office or send a message through [place]. You may also schedule an earlier appointment if necessary.  Additionally, you may be receiving a survey about your experience at our office within a few days to 1 week by e-mail or mail. We value your feedback.  Nobie Putnam, DO [place]

## 2022-09-18 NOTE — Telephone Encounter (Signed)
Okay to proceed w/ Home Health orders.  She should DISCONTINUE Ibuprofen.  She should continue Eliquis and Plavix.  Ellen Baker, [facility] 09/18/2022, 5:44 PM

## 2022-09-19 ENCOUNTER — Telehealth: Payer: Self-pay | Admitting: *Deleted

## 2022-09-19 ENCOUNTER — Telehealth: Payer: Self-pay

## 2022-09-19 ENCOUNTER — Encounter: Payer: Self-pay | Admitting: Family Medicine

## 2022-09-19 DIAGNOSIS — R410 Disorientation, unspecified: Secondary | ICD-10-CM

## 2022-09-19 DIAGNOSIS — R413 Other amnesia: Secondary | ICD-10-CM

## 2022-09-19 NOTE — Telephone Encounter (Signed)
Copied from [place] [id number]. Topic: Referral - Request for Referral >> Sep 18, 2022  4:38 PM Sabas Sous wrote: Has patient seen PCP for this complaint? No. *If NO, is insurance requiring patient see PCP for this issue before PCP can refer them? Referral for which specialty: Neurology  Preferred provider/office: Highest recommended  Reason for referral: (Confidential) pt's daughter called reporting some confusion, so the siblings want to be proactive.

## 2022-09-19 NOTE — Telephone Encounter (Signed)
Called and gave orders as documented by Dr. Landis Gandy.

## 2022-09-19 NOTE — Telephone Encounter (Signed)
Referral has been submitted to [facility]rology  [facility] - Neurology Dept [address], [place] [address] Phone: [phone]  Nobie Putnam, [facility] 09/19/2022, 5:21 PM

## 2022-09-19 NOTE — Transitions of Care (Post Inpatient/ED Visit) (Signed)
   09/19/2022  Name: Ellen Baker MRN: [id number] DOB: 08-02-1950  Today's TOC FU Call Status: Today's TOC FU Call Status:: Successful TOC FU Call Competed TOC FU Call Complete Date: 09/19/22  Transition Care Management Follow-up Telephone Call Date of Discharge: 09/14/22 Discharge Facility: Other ([facility]) Name of Other ([facility]) Discharge Facility: [facility] Type of Discharge: Inpatient Admission Primary Inpatient Discharge Diagnosis:: [id number] fall causing avulsion fracture of lateral malleolus, L gluteal hematoma, scalp hematoma, L elbow laceration: How have you been since you were released from the hospital?: Better Any questions or concerns?: No  Items Reviewed: Did you receive and understand the discharge instructions provided?: Yes Medications obtained and verified?: Yes (Medications Reviewed) (RN reviewed taking the doxycycline with full glass of water and sitting up afterwards to prevent esophageal burning as per Dr ordered.) Any new allergies since your discharge?: No Dietary orders reviewed?: No Do you have support at home?: Yes People in Home: child(ren), adult Name of Support/Comfort Primary Source: AMY  Home Care and Equipment/Supplies: [place] Ordered?: Yes Name of [place]:: Alvis Lemmings Has Agency set up a time to come to your home?: Yes [place] Visit Date: 09/17/22 Any new equipment or medical supplies ordered?: No  Functional Questionnaire: Do you need assistance with bathing/showering or dressing?: Yes Do you need assistance with meal preparation?: Yes Do you need assistance with eating?: No Do you need assistance with getting out of bed/getting out of a chair/moving?: Yes Do you have difficulty managing or taking your medications?: No  Follow up appointments reviewed: PCP Follow-up appointment confirmed?: Yes Date of PCP follow-up appointment?: 09/18/22 Follow-up Provider: DR Parks Ranger 2:00 [facility] Follow-up appointment confirmed?: NA Do you need transportation to your follow-up appointment?: No Do you understand care options if your condition(s) worsen?: Yes-patient verbalized understanding  SDOH Interventions Today    Flowsheet Row Most Recent Value  SDOH Interventions   Food Insecurity Interventions Intervention Not Indicated  Housing Interventions Intervention Not Indicated  Transportation Interventions Intervention Not Indicated      Interventions Today    Flowsheet Row Most Recent Value  General Interventions   General Interventions Discussed/Reviewed General Interventions Discussed, General Interventions Reviewed, Doctor Visits  Doctor Visits Discussed/Reviewed Doctor Visits Discussed, Doctor Visits Reviewed  Pharmacy Interventions   Pharmacy Dicussed/Reviewed Pharmacy Topics Discussed, Pharmacy Topics Reviewed      TOC Interventions Today    Flowsheet Row Most Recent Value  TOC Interventions   TOC Interventions Discussed/Reviewed TOC Interventions Discussed, TOC Interventions Reviewed        [facility] [phone]

## 2022-09-19 NOTE — Addendum Note (Signed)
Addended by: Olin Hauser on: 09/19/2022 05:21 PM   Modules accepted: Orders

## 2022-09-19 NOTE — Progress Notes (Incomplete)
Subjective:    Patient ID: Ellen Baker, female    DOB: [birth year], 72 y.o.   MRN: [id number]  [place] is a 72 y.o. female presenting on 09/18/2022 for Loss of Consciousness (Fall Injury)   Ellen Baker VISIT  Hospital/Location: [facility] Date of Admission: 09/09/22 Date of Discharge: 09/14/22 Transitions of care telephone call: Not completed attempted 1 x yesterday   Reason for Admission: Syncope, Fall Injury  [facility] H&P and Discharge Summary have been reviewed - Patient presents today 4 days after recent hospitalization. Brief summary of recent course, patient had symptoms of syncopal event and fall injury head injury, hospitalized.  - Today reports overall has ***done well after discharge. Symptoms of *** ***have resolved ***are much improved ***seem to be worsening again.   CHF, HFrEF Atrial Fibrillation persistent Followed by [facility] Dr Clayborn Bigness On Amiodarone and Metoprolol Admits fatigue Last [facility]ardiogram 04/16/22 On Eliquis 5mg  TWICE A DAY On Plavix ***she has been off Losartan 50mg  daily and Furosemide 20mg  daily, asking to restart these today.  Anemia Hgb 9 range, now improved to Hgb 10.2, she has upcoming scheduled lab in 4 weeks May 2024 for her annual  Left Ankle Avulsion Fracture CAM Clyde Canterbury *** referral to [place] medications on discharge: *** - Changes to current meds on discharge: Discontinued Losartan 50mg  daily and Furosemide 20mg  daily.  I have reviewed the discharge medication list, and have reconciled the current and discharge medications today.   Current Outpatient Medications:  .  alendronate (FOSAMAX) 70 MG tablet, TAKE 1 TABLET(70 MG) BY MOUTH 1 TIME A WEEK WITH A FULL GLASS OF WATER AND ON AN EMPTY STOMACH, Disp: 12 tablet, Rfl: 1 .  amiodarone (PACERONE) 200 MG tablet, Take by mouth., Disp: , Rfl:  .  apixaban (ELIQUIS) 5 MG TABS tablet, Take 5 mg by mouth 2 (two) times  daily., Disp: , Rfl:  .  Calcium Carbonate-Vitamin D3 600-400 MG-UNIT TABS, Take 2 tablets by mouth daily., Disp: , Rfl:  .  clopidogrel (PLAVIX) 75 MG tablet, TAKE 1 TABLET BY MOUTH DAILY, Disp: 90 tablet, Rfl: 1 .  doxycycline (VIBRA-TABS) 100 MG tablet, Take 1 tablet (100 mg total) by mouth 2 (two) times daily. For 10 days. Take with full glass of water, stay upright 30 min after taking., Disp: 20 tablet, Rfl: 0 .  famotidine (PEPCID) 20 MG tablet, TAKE 1 TABLET(20 MG) BY MOUTH TWICE DAILY, Disp: 180 tablet, Rfl: 1 .  ibuprofen (ADVIL) 200 MG tablet, Take by mouth., Disp: , Rfl:  .  Iron-Vitamin C 65-125 MG TABS, Take 1 tablet by mouth 2 (two) times daily. (Patient taking differently: Take 1 tablet by mouth once a week.), Disp: 60 tablet, Rfl: 1 .  loratadine (CLARITIN) 10 MG tablet, Take 1 tablet (10 mg total) by mouth continuous as needed for allergies., Disp: 30 tablet, Rfl: 6 .  meclizine (ANTIVERT) 25 MG tablet, Take 1 tablet (25 mg total) by mouth 3 (three) times daily as needed for dizziness., Disp: 30 tablet, Rfl: 0 .  metoprolol tartrate (LOPRESSOR) 50 MG tablet, Take 1 tablet (50 mg total) by mouth 2 (two) times daily., Disp: 180 tablet, Rfl: 3 .  Multiple Vitamins-Minerals (CENTRUM SILVER ULTRA WOMENS) TABS, Take 1 tablet by mouth daily., Disp: , Rfl:  .  mupirocin ointment (BACTROBAN) 2 %, Apply 1 Application topically 2 (two) times daily. For 1-2 weeks on topical for skin infection, Disp: 22 g, Rfl: 0 .  simvastatin (ZOCOR) 20 MG tablet, TAKE 1 TABLET(20 MG) BY MOUTH DAILY, Disp: 90 tablet, Rfl: 3 .  vitamin B-12 (CYANOCOBALAMIN) 1000 MCG tablet, Take 1,000 mcg by mouth once a week., Disp: , Rfl:  .  furosemide (LASIX) 20 MG tablet, Take 1 tablet (20 mg total) by mouth daily. (Patient not taking: Reported on 09/18/2022), Disp: 30 tablet, Rfl: 0 .  losartan (COZAAR) 25 MG tablet, Take 1 tablet (25 mg total) by mouth daily. (Patient not taking: Reported on 09/18/2022), Disp: 90 tablet, Rfl:  3  ------------------------------------------------------------------------- Social History   Tobacco Use  . Smoking status: Never  . Smokeless tobacco: Never  . Tobacco comments:    Smoking cessation materials not required  Vaping Use  . Vaping Use: Never used  Substance Use Topics  . Alcohol use: Yes    Alcohol/week: 1.0 standard drink of alcohol    Types: 1 Standard drinks or equivalent per week    Comment: Yearly  . Drug use: No    Review of Systems Per HPI unless specifically indicated above     Objective:    BP 131/64 (BP Location: Right Arm, Patient Position: Sitting, Cuff Size: Normal)   Pulse 75   Temp (!) 96.4 F (35.8 C) (Temporal)   Wt 216 lb (98 kg)   SpO2 96%   BMI 37.08 kg/m   Wt Readings from Last 3 Encounters:  09/18/22 216 lb (98 kg)  08/18/22 206 lb (93.4 kg)  07/31/22 209 lb (94.8 kg)    Physical Exam  ________________________________________________________ PROCEDURE NOTE Date - 09/18/22 Suture Removal - Left Elbow location - Location / Date Sutures were placed: *** - # of Sutures: *** Discussed benefits and risks (including pain, bleeding, infection, wound separation). Verbal consent given by patient Medication:  None ***< 2 cc Lidocaine 1% with epi Time Out taken Examination of the sutured laceration on *** appears to be well healed. Area cleansed with alcohol wipes. Appropriate # of sutures counted and confirmed. Removal of sutures one by one using sterile pick ups to adjust and sterile scissors to cut one side of suture away from knot. Removal was uncomplicated. - All sutures were successfully removed and wound remains intact - *** out of *** sutures were successfully removed, remaining sutures were left in place due to concern about integrity of the wound and recommend further healing before removal. See A&P.    Results for orders placed or performed in visit on 07/25/22  CBC  Result Value Ref Range   WBC 8.5 4.0 - 10.5 K/uL   RBC  4.24 3.87 - 5.11 MIL/uL   Hemoglobin 12.8 12.0 - 15.0 g/dL   HCT 40.1 36.0 - 46.0 %   MCV 94.6 80.0 - 100.0 fL   MCH 30.2 26.0 - 34.0 pg   MCHC 31.9 30.0 - 36.0 g/dL   RDW 14.7 11.5 - 15.5 %   Platelets 270 150 - 400 K/uL   nRBC 0.0 0.0 - 0.2 %  Ferritin  Result Value Ref Range   Ferritin 20 11 - 307 ng/mL  Basic metabolic panel  Result Value Ref Range   Sodium 139 135 - 145 mmol/L   Potassium 3.9 3.5 - 5.1 mmol/L   Chloride 99 98 - 111 mmol/L   CO2 30 22 - 32 mmol/L   Glucose, Bld 107 (H) 70 - 99 mg/dL   BUN 14 8 - 23 mg/dL   Creatinine, Ser 0.99 0.44 - 1.00 mg/dL   Calcium 8.5 (L) 8.9 - 10.3  mg/dL   GFR, Estimated >60 >60 mL/min   Anion gap 10 5 - 15  Vitamin B12  Result Value Ref Range   Vitamin B-12 400 180 - 914 pg/mL  Iron and TIBC([facility])  Result Value Ref Range   Iron 59 28 - 170 ug/dL   TIBC 535 (H) 250 - 450 ug/dL   Saturation Ratios 11 10.4 - 31.8 %   UIBC 476 ug/dL      Assessment & Plan:   Problem List Items Addressed This Visit     Essential hypertension - Primary   Other Visit Diagnoses     Near syncope       Blister of left lower extremity, subsequent encounter       Bilateral lower extremity edema       Abrasion of anterior left lower leg, initial encounter       Relevant Medications   mupirocin ointment (BACTROBAN) 2 %   doxycycline (VIBRA-TABS) 100 MG tablet        Meds ordered this encounter  Medications  . mupirocin ointment (BACTROBAN) 2 %    Sig: Apply 1 Application topically 2 (two) times daily. For 1-2 weeks on topical for skin infection    Dispense:  22 g    Refill:  0  . doxycycline (VIBRA-TABS) 100 MG tablet    Sig: Take 1 tablet (100 mg total) by mouth 2 (two) times daily. For 10 days. Take with full glass of water, stay upright 30 min after taking.    Dispense:  20 tablet    Refill:  0    Follow up plan: Return if symptoms worsen or fail to improve.  Nobie Putnam, [facility] 09/18/2022, 2:35 PM

## 2022-09-20 NOTE — Telephone Encounter (Signed)
Patient notified by MyChart.

## 2022-09-26 DIAGNOSIS — S8262XA Displaced fracture of lateral malleolus of left fibula, initial encounter for closed fracture: Secondary | ICD-10-CM | POA: Diagnosis not present

## 2022-09-26 DIAGNOSIS — M25572 Pain in left ankle and joints of left foot: Secondary | ICD-10-CM | POA: Diagnosis not present

## 2022-10-03 DIAGNOSIS — R410 Disorientation, unspecified: Secondary | ICD-10-CM | POA: Diagnosis not present

## 2022-10-03 DIAGNOSIS — Z9181 History of falling: Secondary | ICD-10-CM | POA: Diagnosis not present

## 2022-10-03 DIAGNOSIS — R413 Other amnesia: Secondary | ICD-10-CM | POA: Diagnosis not present

## 2022-10-03 DIAGNOSIS — G479 Sleep disorder, unspecified: Secondary | ICD-10-CM | POA: Diagnosis not present

## 2022-10-03 DIAGNOSIS — R0683 Snoring: Secondary | ICD-10-CM | POA: Diagnosis not present

## 2022-10-03 DIAGNOSIS — R441 Visual hallucinations: Secondary | ICD-10-CM | POA: Diagnosis not present

## 2022-10-09 DIAGNOSIS — I482 Chronic atrial fibrillation, unspecified: Secondary | ICD-10-CM | POA: Diagnosis not present

## 2022-10-22 ENCOUNTER — Other Ambulatory Visit: Payer: PPO

## 2022-10-22 DIAGNOSIS — R7309 Other abnormal glucose: Secondary | ICD-10-CM

## 2022-10-22 DIAGNOSIS — E559 Vitamin D deficiency, unspecified: Secondary | ICD-10-CM

## 2022-10-22 DIAGNOSIS — I4819 Other persistent atrial fibrillation: Secondary | ICD-10-CM

## 2022-10-22 DIAGNOSIS — I6930 Unspecified sequelae of cerebral infarction: Secondary | ICD-10-CM

## 2022-10-22 DIAGNOSIS — I10 Essential (primary) hypertension: Secondary | ICD-10-CM

## 2022-10-22 DIAGNOSIS — Z0000 Encounter for general adult medical examination without abnormal findings: Secondary | ICD-10-CM

## 2022-10-22 DIAGNOSIS — E785 Hyperlipidemia, unspecified: Secondary | ICD-10-CM

## 2022-10-23 ENCOUNTER — Other Ambulatory Visit: Payer: PPO

## 2022-10-23 DIAGNOSIS — Z0000 Encounter for general adult medical examination without abnormal findings: Secondary | ICD-10-CM | POA: Diagnosis not present

## 2022-10-23 DIAGNOSIS — E559 Vitamin D deficiency, unspecified: Secondary | ICD-10-CM | POA: Diagnosis not present

## 2022-10-23 DIAGNOSIS — I4819 Other persistent atrial fibrillation: Secondary | ICD-10-CM | POA: Diagnosis not present

## 2022-10-23 DIAGNOSIS — I10 Essential (primary) hypertension: Secondary | ICD-10-CM | POA: Diagnosis not present

## 2022-10-23 DIAGNOSIS — I6930 Unspecified sequelae of cerebral infarction: Secondary | ICD-10-CM | POA: Diagnosis not present

## 2022-10-23 DIAGNOSIS — R7309 Other abnormal glucose: Secondary | ICD-10-CM | POA: Diagnosis not present

## 2022-10-23 DIAGNOSIS — E785 Hyperlipidemia, unspecified: Secondary | ICD-10-CM | POA: Diagnosis not present

## 2022-10-23 LAB — CBC WITH DIFFERENTIAL/PLATELET
MCH: 29.8 pg (ref 27.0–33.0)
Monocytes Relative: 11.2 %
Platelets: 258 10*3/uL (ref 140–400)
RBC: 4.29 10*6/uL (ref 3.80–5.10)

## 2022-10-24 LAB — CBC WITH DIFFERENTIAL/PLATELET
Absolute Monocytes: 538 cells/uL (ref 200–950)
Basophils Absolute: 58 cells/uL (ref 0–200)
Basophils Relative: 1.2 %
Eosinophils Absolute: 197 cells/uL (ref 15–500)
Eosinophils Relative: 4.1 %
HCT: 39.9 % (ref 35.0–45.0)
Hemoglobin: 12.8 g/dL (ref 11.7–15.5)
Lymphs Abs: 826 cells/uL — ABNORMAL LOW (ref 850–3900)
MCHC: 32.1 g/dL (ref 32.0–36.0)
MCV: 93 fL (ref 80.0–100.0)
MPV: 9.6 fL (ref 7.5–12.5)
Neutro Abs: 3182 cells/uL (ref 1500–7800)
Neutrophils Relative %: 66.3 %
RDW: 13.2 % (ref 11.0–15.0)
Total Lymphocyte: 17.2 %
WBC: 4.8 10*3/uL (ref 3.8–10.8)

## 2022-10-24 LAB — COMPLETE METABOLIC PANEL WITH GFR
AG Ratio: 1.1 (calc) (ref 1.0–2.5)
ALT: 10 U/L (ref 6–29)
AST: 17 U/L (ref 10–35)
Albumin: 3.5 g/dL — ABNORMAL LOW (ref 3.6–5.1)
Alkaline phosphatase (APISO): 66 U/L (ref 37–153)
BUN: 8 mg/dL (ref 7–25)
CO2: 31 mmol/L (ref 20–32)
Calcium: 8.6 mg/dL (ref 8.6–10.4)
Chloride: 103 mmol/L (ref 98–110)
Creat: 0.82 mg/dL (ref 0.60–1.00)
Globulin: 3.1 g/dL (calc) (ref 1.9–3.7)
Glucose, Bld: 88 mg/dL (ref 65–99)
Potassium: 4.1 mmol/L (ref 3.5–5.3)
Sodium: 142 mmol/L (ref 135–146)
Total Bilirubin: 0.6 mg/dL (ref 0.2–1.2)
Total Protein: 6.6 g/dL (ref 6.1–8.1)
eGFR: 76 mL/min/{1.73_m2} (ref >=60)

## 2022-10-24 LAB — HEMOGLOBIN A1C
Hgb A1c MFr Bld: 5.5 % of total Hgb (ref <5.7)
Mean Plasma Glucose: 111 mg/dL
eAG (mmol/L): 6.2 mmol/L

## 2022-10-24 LAB — TSH: TSH: 3.98 mIU/L (ref 0.40–4.50)

## 2022-10-24 LAB — LIPID PANEL
Cholesterol: 180 mg/dL (ref <200)
HDL: 63 mg/dL (ref >=50)
LDL Cholesterol (Calc): 97 mg/dL (calc)
Non-HDL Cholesterol (Calc): 117 mg/dL (calc) (ref <130)
Total CHOL/HDL Ratio: 2.9 (calc) (ref <5.0)
Triglycerides: 100 mg/dL (ref <150)

## 2022-10-24 LAB — VITAMIN D 25 HYDROXY (VIT D DEFICIENCY, FRACTURES): Vit D, 25-Hydroxy: 25 ng/mL — ABNORMAL LOW (ref 30–100)

## 2022-10-29 ENCOUNTER — Ambulatory Visit (INDEPENDENT_AMBULATORY_CARE_PROVIDER_SITE_OTHER): Payer: PPO | Admitting: Family Medicine

## 2022-10-29 ENCOUNTER — Encounter: Payer: Self-pay | Admitting: Family Medicine

## 2022-10-29 VITALS — BP 132/78 | HR 72 | Ht 64.0 in | Wt 209.0 lb

## 2022-10-29 DIAGNOSIS — F5104 Psychophysiologic insomnia: Secondary | ICD-10-CM

## 2022-10-29 DIAGNOSIS — I5020 Unspecified systolic (congestive) heart failure: Secondary | ICD-10-CM

## 2022-10-29 DIAGNOSIS — Z1231 Encounter for screening mammogram for malignant neoplasm of breast: Secondary | ICD-10-CM | POA: Diagnosis not present

## 2022-10-29 DIAGNOSIS — I10 Essential (primary) hypertension: Secondary | ICD-10-CM | POA: Diagnosis not present

## 2022-10-29 DIAGNOSIS — I4819 Other persistent atrial fibrillation: Secondary | ICD-10-CM | POA: Diagnosis not present

## 2022-10-29 DIAGNOSIS — Z0000 Encounter for general adult medical examination without abnormal findings: Secondary | ICD-10-CM | POA: Diagnosis not present

## 2022-10-29 MED ORDER — TRAZODONE HCL 50 MG PO TABS
25.0000 mg | ORAL_TABLET | Freq: Every day | ORAL | 3 refills | Status: DC
Start: 2022-10-29 — End: 2023-11-05

## 2022-10-29 NOTE — Progress Notes (Signed)
Subjective:    Patient ID: [name], female    DOB: 07-08-1950, 72 y.o.   MRN: [id number]  Ellen Baker is a 72 y.o. female presenting on 10/29/2022 for Annual Exam (Trazadone helped some)   HPI  Here for Annual Physical and Lab Review  Elevated A1c A1c 5.5, prior result 5.3 year 9 yr ago She has goal to limit excess sweets.  CHF, HFrEF Atrial Fibrillation persistent Followed by [facility] Cardiology Dr Juliann Pares On Amiodarone and Metoprolol Admits fatigue Last [facility]ardiogram 04/16/22 On Eliquis 5mg  TWICE A DAY On Plavix She has been off Losartan 50mg  daily and Furosemide 20mg  daily, asking to restart these today. She has had increased edema   Anemia Last lab shows Hgb 12.8, stable from prior   Health Maintenance:  Colonoscopy 03/10/18, 5 year repeat, Dr Marva Panda retired. Now she will need to be seen for repeat in 02/2023. She will call to schedule.  Mammogram last done 05/25/22, negative. Repeat yearly, 05/2023. Order today she will schedule. [facility]  UTD Pneumonia vaccine.  Considering Shingrix Vaccine.     10/29/2022    3:21 PM 07/31/2022   10:03 AM 07/23/2022   10:09 AM  Depression screen PHQ 2/9  Decreased Interest 0 0 0  Down, Depressed, Hopeless 0 0 0  PHQ - 2 Score 0 0 0  Altered sleeping  0 0  Tired, decreased energy  0 0  Change in appetite  0 0  Feeling bad or failure about yourself   0 0  Trouble concentrating  0 0  Moving slowly or fidgety/restless  0 0  Suicidal thoughts  0 0  PHQ-9 Score  0 0  Difficult doing work/chores  Not difficult at all Not difficult at all    Past Medical History:  Diagnosis Date   Arthritis    right knee   Atrial fibrillation (HCC)    Carotid artery occlusion    Dizziness    GERD (gastroesophageal reflux disease)    Heart murmur    History of colon polyps    Hyperlipidemia    Hypertension    Iron deficiency    Stroke (HCC) 12/31/2012   no residual effects   Past Surgical History:  Procedure  Laterality Date   CAROTID ENDARTERECTOMY     RIGHT  01/19/13   COLONOSCOPY  08/18/2012   COLONOSCOPY WITH PROPOFOL N/A 03/10/2018   Procedure: COLONOSCOPY WITH PROPOFOL;  Surgeon: Christena Deem, MD;  Location: [facility] ENDOSCOPY;  Service: Endoscopy;  Laterality: N/A;   DILATION AND CURETTAGE OF UTERUS     ENDARTERECTOMY Right 01/19/2013   Procedure: ENDARTERECTOMY CAROTID with patch angioplasty;  Surgeon: Fransisco Hertz, MD;  Location: [facility] OR;  Service: Vascular;  Laterality: Right;   ESOPHAGEAL DILATION     JOINT REPLACEMENT Left 04/15/2017   Knee   taotal knee arthorplasty Right 2021   TOTAL KNEE ARTHROPLASTY Right 12/01/2019   Procedure: TOTAL KNEE ARTHROPLASTY - RNFA;  Surgeon: Christena Flake, MD;  Location: [facility] ORS;  Service: Orthopedics;  Laterality: Right;   TUBAL LIGATION     UPPER GI ENDOSCOPY  08/18/2012   Social History   Socioeconomic History   Marital status: Widowed    Spouse name: Not on file   Number of children: 3   Years of education: Not on file   Highest education level: 12th grade  Occupational History   Occupation: retired  Tobacco Use   Smoking status: Never   Smokeless tobacco: Never   Tobacco comments:  Smoking cessation materials not required  Vaping Use   Vaping Use: Never used  Substance and Sexual Activity   Alcohol use: Yes    Alcohol/week: 1.0 standard drink of alcohol    Types: 1 Standard drinks or equivalent per week    Comment: Yearly   Drug use: No   Sexual activity: Never  Other Topics Concern   Not on file  Social History Narrative   Pt lives alone.    Social Determinants of Health   Financial Resource Strain: Low Risk  (06/06/2022)   Overall Financial Resource Strain (CARDIA)    Difficulty of Paying Living Expenses: Not hard at all  Food Insecurity: No Food Insecurity (09/19/2022)   Hunger Vital Sign    Worried About Running Out of Food in the Last Year: Never true    Ran Out of Food in the Last Year: Never true   Transportation Needs: No Transportation Needs (09/19/2022)   PRAPARE - Administrator, Civil Service (Medical): No    Lack of Transportation (Non-Medical): No  Physical Activity: Inactive (06/06/2022)   Exercise Vital Sign    Days of Exercise per Week: 0 days    Minutes of Exercise per Session: 0 min  Stress: No Stress Concern Present (06/06/2022)   [employer] of Occupational Health - Occupational Stress Questionnaire    Feeling of Stress : Only a little  Social Connections: Moderately Integrated (06/06/2022)   Social Connection and Isolation Panel [NHANES]    Frequency of Communication with Friends and Family: More than three times a week    Frequency of Social Gatherings with Friends and Family: More than three times a week    Attends Religious Services: More than 4 times per year    Active Member of [employer] or Organizations: Yes    Attends Banker Meetings: More than 4 times per year    Marital Status: Widowed  Intimate Partner Violence: Not At Risk (06/06/2022)   Humiliation, Afraid, Rape, and Kick questionnaire    Fear of Current or Ex-Partner: No    Emotionally Abused: No    Physically Abused: No    Sexually Abused: No   Family History  Problem Relation Age of Onset   Stroke Mother    Deep vein thrombosis Mother    Heart disease Mother        Amputation-   Hypertension Mother    Alzheimer's disease Mother    Heart failure Father    Heart disease Father        CHF   Hypertension Father    Varicose Veins Father    Parkinson's disease Father    Breast cancer Paternal Aunt 32   Current Outpatient Medications on File Prior to Visit  Medication Sig   alendronate (FOSAMAX) 70 MG tablet TAKE 1 TABLET(70 MG) BY MOUTH 1 TIME A WEEK WITH A FULL GLASS OF WATER AND ON AN EMPTY STOMACH   amiodarone (PACERONE) 200 MG tablet Take by mouth.   apixaban (ELIQUIS) 5 MG TABS tablet Take 5 mg by mouth 2 (two) times daily.   Calcium Carbonate-Vitamin D3  600-400 MG-UNIT TABS Take 2 tablets by mouth daily.   clopidogrel (PLAVIX) 75 MG tablet TAKE 1 TABLET BY MOUTH DAILY   doxycycline (VIBRA-TABS) 100 MG tablet Take 1 tablet (100 mg total) by mouth 2 (two) times daily. For 10 days. Take with full glass of water, stay upright 30 min after taking.   famotidine (PEPCID) 20 MG tablet TAKE 1 TABLET(20  MG) BY MOUTH TWICE DAILY   furosemide (LASIX) 20 MG tablet Take 1 tablet (20 mg total) by mouth daily.   ibuprofen (ADVIL) 200 MG tablet Take by mouth.   Iron-Vitamin C 65-125 MG TABS Take 1 tablet by mouth 2 (two) times daily. (Patient taking differently: Take 1 tablet by mouth once a week.)   loratadine (CLARITIN) 10 MG tablet Take 1 tablet (10 mg total) by mouth continuous as needed for allergies.   losartan (COZAAR) 25 MG tablet Take 1 tablet (25 mg total) by mouth daily.   meclizine (ANTIVERT) 25 MG tablet Take 1 tablet (25 mg total) by mouth 3 (three) times daily as needed for dizziness.   metoprolol tartrate (LOPRESSOR) 50 MG tablet Take 1 tablet (50 mg total) by mouth 2 (two) times daily.   Multiple Vitamins-Minerals (CENTRUM SILVER ULTRA WOMENS) TABS Take 1 tablet by mouth daily.   mupirocin ointment (BACTROBAN) 2 % Apply 1 Application topically 2 (two) times daily. For 1-2 weeks on topical for skin infection   simvastatin (ZOCOR) 20 MG tablet TAKE 1 TABLET(20 MG) BY MOUTH DAILY   vitamin B-12 (CYANOCOBALAMIN) 1000 MCG tablet Take 1,000 mcg by mouth once a week.   No current facility-administered medications on file prior to visit.    Review of Systems  Constitutional:  Negative for activity change, appetite change, chills, diaphoresis, fatigue and fever.  HENT:  Negative for congestion and hearing loss.   Eyes:  Negative for visual disturbance.  Respiratory:  Negative for cough, chest tightness, shortness of breath and wheezing.   Cardiovascular:  Negative for chest pain, palpitations and leg swelling.  Gastrointestinal:  Negative for  abdominal pain, constipation, diarrhea, nausea and vomiting.  Genitourinary:  Negative for dysuria, frequency and hematuria.  Musculoskeletal:  Negative for arthralgias and neck pain.  Skin:  Negative for rash.  Neurological:  Negative for dizziness, weakness, light-headedness, numbness and headaches.  Hematological:  Negative for adenopathy.  Psychiatric/Behavioral:  Negative for behavioral problems, dysphoric mood and sleep disturbance.    Per HPI unless specifically indicated above      Objective:    BP 132/78   Pulse 72   Ht 5\' 4"  (1.626 m)   Wt 209 lb (94.8 kg)   SpO2 98%   BMI 35.87 kg/m   Wt Readings from Last 3 Encounters:  10/29/22 209 lb (94.8 kg)  09/18/22 216 lb (98 kg)  08/18/22 206 lb (93.4 kg)    Physical Exam Vitals and nursing note reviewed.  Constitutional:      General: She is not in acute distress.    Appearance: She is well-developed. She is not diaphoretic.     Comments: Well-appearing, comfortable, cooperative  HENT:     Head: Normocephalic and atraumatic.  Eyes:     General:        Right eye: No discharge.        Left eye: No discharge.     Conjunctiva/sclera: Conjunctivae normal.     Pupils: Pupils are equal, round, and reactive to light.  Neck:     Thyroid: No thyromegaly.  Cardiovascular:     Rate and Rhythm: Normal rate. Rhythm irregular.     Pulses: Normal pulses.     Heart sounds: Normal heart sounds. No murmur heard. Pulmonary:     Effort: Pulmonary effort is normal. No respiratory distress.     Breath sounds: Normal breath sounds. No wheezing or rales.  Abdominal:     General: Bowel sounds are normal. There is no  distension.     Palpations: Abdomen is soft. There is no mass.     Tenderness: There is no abdominal tenderness.  Musculoskeletal:        General: No tenderness. Normal range of motion.     Cervical back: Normal range of motion and neck supple.     Comments: Upper / Lower Extremities: - Normal muscle tone, strength  bilateral upper extremities 5/5, lower extremities 5/5  Lymphadenopathy:     Cervical: No cervical adenopathy.  Skin:    General: Skin is warm and dry.     Findings: No erythema or rash.  Neurological:     Mental Status: She is alert and oriented to person, place, and time.     Comments: Distal sensation intact to light touch all extremities  Psychiatric:        Mood and Affect: Mood normal.        Behavior: Behavior normal.        Thought Content: Thought content normal.     Comments: Well groomed, good eye contact, normal speech and thoughts    Results for orders placed or performed in visit on 10/22/22  VITAMIN D 25 Hydroxy (Vit-D Deficiency, Fractures)  Result Value Ref Range   Vit D, 25-Hydroxy 25 (L) 30 - 100 ng/mL  TSH  Result Value Ref Range   TSH 3.98 0.40 - 4.50 mIU/L  Hemoglobin A1c  Result Value Ref Range   Hgb A1c MFr Bld 5.5 <5.7 % of total Hgb   Mean Plasma Glucose 111 mg/dL   eAG (mmol/L) 6.2 mmol/L  Lipid panel  Result Value Ref Range   Cholesterol 180 <200 mg/dL   HDL 63 > OR = 50 mg/dL   Triglycerides 696 <295 mg/dL   LDL Cholesterol (Calc) 97 mg/dL (calc)   Total CHOL/HDL Ratio 2.9 <5.0 (calc)   Non-HDL Cholesterol (Calc) 117 <130 mg/dL (calc)  CBC with Differential/Platelet  Result Value Ref Range   WBC 4.8 3.8 - 10.8 Thousand/uL   RBC 4.29 3.80 - 5.10 Million/uL   Hemoglobin 12.8 11.7 - 15.5 g/dL   HCT 28.4 13.2 - 44.0 %   MCV 93.0 80.0 - 100.0 fL   MCH 29.8 27.0 - 33.0 pg   MCHC 32.1 32.0 - 36.0 g/dL   RDW 10.2 72.5 - 36.6 %   Platelets 258 140 - 400 Thousand/uL   MPV 9.6 7.5 - 12.5 fL   Neutro Abs 3,182 1,500 - 7,800 cells/uL   Lymphs Abs 826 (L) 850 - 3,900 cells/uL   Absolute Monocytes 538 200 - 950 cells/uL   Eosinophils Absolute 197 15 - 500 cells/uL   Basophils Absolute 58 0 - 200 cells/uL   Neutrophils Relative % 66.3 %   Total Lymphocyte 17.2 %   Monocytes Relative 11.2 %   Eosinophils Relative 4.1 %   Basophils Relative 1.2 %   COMPLETE METABOLIC PANEL WITH GFR  Result Value Ref Range   Glucose, Bld 88 65 - 99 mg/dL   BUN 8 7 - 25 mg/dL   Creat 4.40 3.47 - 4.25 mg/dL   eGFR 76 > OR = 60 ZD/GLO/7.56E3   BUN/Creatinine Ratio SEE NOTE: 6 - 22 (calc)   Sodium 142 135 - 146 mmol/L   Potassium 4.1 3.5 - 5.3 mmol/L   Chloride 103 98 - 110 mmol/L   CO2 31 20 - 32 mmol/L   Calcium 8.6 8.6 - 10.4 mg/dL   Total Protein 6.6 6.1 - 8.1 g/dL   Albumin 3.5 (L)  3.6 - 5.1 g/dL   Globulin 3.1 1.9 - 3.7 g/dL (calc)   AG Ratio 1.1 1.0 - 2.5 (calc)   Total Bilirubin 0.6 0.2 - 1.2 mg/dL   Alkaline phosphatase (APISO) 66 37 - 153 U/L   AST 17 10 - 35 U/L   ALT 10 6 - 29 U/L      Assessment & Plan:   Problem List Items Addressed This Visit     Essential hypertension    Stable controlled HYPERTENSION on current therapy      HFrEF (heart failure with reduced ejection fraction) (HCC)    Followed by Cardiology Stable euvolemic On med management      Persistent atrial fibrillation (HCC)    Followed by [facility] Cardiology  On amio, Eliquis, Metoprolol      Other Visit Diagnoses     Annual physical exam    -  Primary   Encounter for screening mammogram for malignant neoplasm of breast       Relevant Orders   MM 3D SCREENING MAMMOGRAM BILATERAL BREAST   Psychophysiological insomnia       Relevant Medications   traZODone (DESYREL) 50 MG tablet       Updated Health Maintenance information Reviewed recent lab results with patient Encouraged improvement to lifestyle with diet and exercise Goal of weight loss   Blood sugar average is not a concern but goal is to limit excess carb starch and sweets  Vitamin D level 25, goal is 30+  Okay to take multivitamin If you want extra Vitamin D, can take Vitamin D3 2,000 unit daily supplement in addition.  DECEMBER 2024 For Mammogram screening for breast cancer   Call the Imaging Center below anytime to schedule your own appointment now that order has been  placed.  [facility] at [facility] [address], [address] [place], [place] [address] Phone: [phone]  Colonoscopy - if you can schedule for [facility] 2024.  ----  Insomnia  Continue Trazodone 25mg  (half dose of 50mg ) nightly for sleep.  Orders Placed This Encounter  Procedures   MM 3D SCREENING MAMMOGRAM BILATERAL BREAST    Standing Status:   Future    Standing Expiration Date:   10/29/2023    Order Specific Question:   Reason for Exam (SYMPTOM  OR DIAGNOSIS REQUIRED)    Answer:   Screening bilateral 3D Mammogram Tomo    Order Specific Question:   Preferred imaging location?    Answer:    [facility]     Meds ordered this encounter  Medications   traZODone (DESYREL) 50 MG tablet    Sig: Take 0.5 tablets (25 mg total) by mouth at bedtime.    Dispense:  45 tablet    Refill:  3     Follow up plan: Return in about 6 months (around 05/01/2023) for 6 follow-up A1c POC, and follow up CHF 6 month visit.  Saralyn Pilar, DO [facility] 10/29/2022, 2:48 PM

## 2022-10-29 NOTE — Assessment & Plan Note (Signed)
Stable controlled HYPERTENSION on current therapy

## 2022-10-29 NOTE — Assessment & Plan Note (Signed)
Followed by [facility] Cardiology  On amio, Eliquis, Metoprolol

## 2022-10-29 NOTE — Assessment & Plan Note (Signed)
Followed by Cardiology Stable euvolemic On med management

## 2022-10-29 NOTE — Patient Instructions (Addendum)
Thank you for coming to the office today.  Recent Labs    10/23/22 0819  HGBA1C 5.5   Blood sugar average is not a concern but goal is to limit excess carb starch and sweets  Vitamin D level 25, goal is 30+  Okay to take multivitamin If you want extra Vitamin D, can take Vitamin D3 2,000 unit daily supplement in addition.  DECEMBER 2024 For Mammogram screening for breast cancer   Call the Imaging Center below anytime to schedule your own appointment now that order has been placed.  [facility] at [facility] [address] [place], [place] [address] Phone: [phone]  Colonoscopy - if you can schedule for [facility] 2024.  ----  Continue Trazodone 25mg  (half dose of 50mg ) nightly for sleep.   Please schedule a Follow-up Appointment to: Return in about 6 months (around 05/01/2023) for 6 follow-up A1c POC, and follow up CHF 6 month visit.  If you have any other questions or concerns, please feel free to call the office or send a message through MyChart. You may also schedule an earlier appointment if necessary.  Additionally, you may be receiving a survey about your experience at our office within a few days to 1 week by e-mail or mail. We value your feedback.  Saralyn Pilar, DO [facility], [place]

## 2022-11-13 DIAGNOSIS — I480 Paroxysmal atrial fibrillation: Secondary | ICD-10-CM | POA: Diagnosis not present

## 2022-11-13 DIAGNOSIS — I5020 Unspecified systolic (congestive) heart failure: Secondary | ICD-10-CM | POA: Diagnosis not present

## 2022-11-13 DIAGNOSIS — I10 Essential (primary) hypertension: Secondary | ICD-10-CM | POA: Diagnosis not present

## 2022-12-21 DIAGNOSIS — G4733 Obstructive sleep apnea (adult) (pediatric): Secondary | ICD-10-CM | POA: Diagnosis not present

## 2022-12-26 ENCOUNTER — Ambulatory Visit: Payer: PPO | Admitting: Family Medicine

## 2023-01-01 DIAGNOSIS — R410 Disorientation, unspecified: Secondary | ICD-10-CM | POA: Diagnosis not present

## 2023-01-01 DIAGNOSIS — R441 Visual hallucinations: Secondary | ICD-10-CM | POA: Diagnosis not present

## 2023-01-01 DIAGNOSIS — G479 Sleep disorder, unspecified: Secondary | ICD-10-CM | POA: Diagnosis not present

## 2023-01-01 DIAGNOSIS — R413 Other amnesia: Secondary | ICD-10-CM | POA: Diagnosis not present

## 2023-01-01 DIAGNOSIS — R0683 Snoring: Secondary | ICD-10-CM | POA: Diagnosis not present

## 2023-01-21 ENCOUNTER — Inpatient Hospital Stay: Payer: PPO | Attending: Internal Medicine

## 2023-01-21 DIAGNOSIS — D124 Benign neoplasm of descending colon: Secondary | ICD-10-CM | POA: Insufficient documentation

## 2023-01-21 DIAGNOSIS — I4891 Unspecified atrial fibrillation: Secondary | ICD-10-CM | POA: Insufficient documentation

## 2023-01-21 DIAGNOSIS — E785 Hyperlipidemia, unspecified: Secondary | ICD-10-CM | POA: Insufficient documentation

## 2023-01-21 DIAGNOSIS — R011 Cardiac murmur, unspecified: Secondary | ICD-10-CM | POA: Insufficient documentation

## 2023-01-21 DIAGNOSIS — D509 Iron deficiency anemia, unspecified: Secondary | ICD-10-CM | POA: Insufficient documentation

## 2023-01-21 DIAGNOSIS — K219 Gastro-esophageal reflux disease without esophagitis: Secondary | ICD-10-CM | POA: Insufficient documentation

## 2023-01-21 DIAGNOSIS — Z86010 Personal history of colonic polyps: Secondary | ICD-10-CM | POA: Insufficient documentation

## 2023-01-21 DIAGNOSIS — D122 Benign neoplasm of ascending colon: Secondary | ICD-10-CM | POA: Insufficient documentation

## 2023-01-21 DIAGNOSIS — Z7901 Long term (current) use of anticoagulants: Secondary | ICD-10-CM | POA: Insufficient documentation

## 2023-01-21 DIAGNOSIS — D125 Benign neoplasm of sigmoid colon: Secondary | ICD-10-CM | POA: Insufficient documentation

## 2023-01-21 DIAGNOSIS — K5730 Diverticulosis of large intestine without perforation or abscess without bleeding: Secondary | ICD-10-CM | POA: Insufficient documentation

## 2023-01-21 DIAGNOSIS — M129 Arthropathy, unspecified: Secondary | ICD-10-CM | POA: Insufficient documentation

## 2023-01-21 DIAGNOSIS — Z7902 Long term (current) use of antithrombotics/antiplatelets: Secondary | ICD-10-CM | POA: Insufficient documentation

## 2023-01-21 DIAGNOSIS — I10 Essential (primary) hypertension: Secondary | ICD-10-CM | POA: Insufficient documentation

## 2023-01-21 DIAGNOSIS — Z8673 Personal history of transient ischemic attack (TIA), and cerebral infarction without residual deficits: Secondary | ICD-10-CM | POA: Insufficient documentation

## 2023-01-21 DIAGNOSIS — Z79899 Other long term (current) drug therapy: Secondary | ICD-10-CM | POA: Insufficient documentation

## 2023-01-21 DIAGNOSIS — E538 Deficiency of other specified B group vitamins: Secondary | ICD-10-CM | POA: Insufficient documentation

## 2023-01-22 ENCOUNTER — Inpatient Hospital Stay: Payer: PPO

## 2023-01-22 DIAGNOSIS — D124 Benign neoplasm of descending colon: Secondary | ICD-10-CM | POA: Diagnosis not present

## 2023-01-22 DIAGNOSIS — E538 Deficiency of other specified B group vitamins: Secondary | ICD-10-CM

## 2023-01-22 DIAGNOSIS — I4891 Unspecified atrial fibrillation: Secondary | ICD-10-CM | POA: Diagnosis not present

## 2023-01-22 DIAGNOSIS — M129 Arthropathy, unspecified: Secondary | ICD-10-CM | POA: Diagnosis not present

## 2023-01-22 DIAGNOSIS — R011 Cardiac murmur, unspecified: Secondary | ICD-10-CM | POA: Diagnosis not present

## 2023-01-22 DIAGNOSIS — D122 Benign neoplasm of ascending colon: Secondary | ICD-10-CM | POA: Diagnosis not present

## 2023-01-22 DIAGNOSIS — I10 Essential (primary) hypertension: Secondary | ICD-10-CM | POA: Diagnosis not present

## 2023-01-22 DIAGNOSIS — D125 Benign neoplasm of sigmoid colon: Secondary | ICD-10-CM | POA: Diagnosis not present

## 2023-01-22 DIAGNOSIS — Z7902 Long term (current) use of antithrombotics/antiplatelets: Secondary | ICD-10-CM | POA: Diagnosis not present

## 2023-01-22 DIAGNOSIS — D509 Iron deficiency anemia, unspecified: Secondary | ICD-10-CM | POA: Diagnosis not present

## 2023-01-22 DIAGNOSIS — E785 Hyperlipidemia, unspecified: Secondary | ICD-10-CM | POA: Diagnosis not present

## 2023-01-22 DIAGNOSIS — Z7901 Long term (current) use of anticoagulants: Secondary | ICD-10-CM | POA: Diagnosis not present

## 2023-01-22 DIAGNOSIS — Z8673 Personal history of transient ischemic attack (TIA), and cerebral infarction without residual deficits: Secondary | ICD-10-CM | POA: Diagnosis not present

## 2023-01-22 DIAGNOSIS — Z79899 Other long term (current) drug therapy: Secondary | ICD-10-CM | POA: Diagnosis not present

## 2023-01-22 DIAGNOSIS — K219 Gastro-esophageal reflux disease without esophagitis: Secondary | ICD-10-CM | POA: Diagnosis not present

## 2023-01-22 DIAGNOSIS — Z86010 Personal history of colonic polyps: Secondary | ICD-10-CM | POA: Diagnosis not present

## 2023-01-22 DIAGNOSIS — K5730 Diverticulosis of large intestine without perforation or abscess without bleeding: Secondary | ICD-10-CM | POA: Diagnosis not present

## 2023-01-22 LAB — CBC WITH DIFFERENTIAL/PLATELET
Abs Immature Granulocytes: 0 10*3/uL (ref 0.00–0.07)
Basophils Absolute: 0 10*3/uL (ref 0.0–0.1)
Basophils Relative: 1 %
Eosinophils Absolute: 0.3 10*3/uL (ref 0.0–0.5)
Eosinophils Relative: 5 %
HCT: 42.6 % (ref 36.0–46.0)
Hemoglobin: 13.7 g/dL (ref 12.0–15.0)
Immature Granulocytes: 0 %
Lymphocytes Relative: 25 %
Lymphs Abs: 1.2 10*3/uL (ref 0.7–4.0)
MCH: 29.3 pg (ref 26.0–34.0)
MCHC: 32.2 g/dL (ref 30.0–36.0)
MCV: 91.2 fL (ref 80.0–100.0)
Monocytes Absolute: 0.6 10*3/uL (ref 0.1–1.0)
Monocytes Relative: 13 %
Neutro Abs: 2.6 10*3/uL (ref 1.7–7.7)
Neutrophils Relative %: 56 %
Platelets: 213 10*3/uL (ref 150–400)
RBC: 4.67 MIL/uL (ref 3.87–5.11)
RDW: 13.8 % (ref 11.5–15.5)
WBC: 4.7 10*3/uL (ref 4.0–10.5)
nRBC: 0 % (ref 0.0–0.2)

## 2023-01-22 LAB — COMPREHENSIVE METABOLIC PANEL
ALT: 17 U/L (ref 0–44)
AST: 24 U/L (ref 15–41)
Albumin: 3.7 g/dL (ref 3.5–5.0)
Alkaline Phosphatase: 75 U/L (ref 38–126)
Anion gap: 8 (ref 5–15)
BUN: 9 mg/dL (ref 8–23)
CO2: 28 mmol/L (ref 22–32)
Calcium: 8.8 mg/dL — ABNORMAL LOW (ref 8.9–10.3)
Chloride: 102 mmol/L (ref 98–111)
Creatinine, Ser: 0.85 mg/dL (ref 0.44–1.00)
GFR, Estimated: >60 mL/min (ref >60)
Glucose, Bld: 113 mg/dL — ABNORMAL HIGH (ref 70–99)
Potassium: 3.3 mmol/L — ABNORMAL LOW (ref 3.5–5.1)
Sodium: 138 mmol/L (ref 135–145)
Total Bilirubin: 1 mg/dL (ref 0.3–1.2)
Total Protein: 7.4 g/dL (ref 6.5–8.1)

## 2023-01-22 LAB — IRON AND TIBC
Iron: 79 ug/dL (ref 28–170)
Saturation Ratios: 18 % (ref 10.4–31.8)
TIBC: 441 ug/dL (ref 250–450)
UIBC: 362 ug/dL

## 2023-01-22 LAB — VITAMIN B12: Vitamin B-12: 1351 pg/mL — ABNORMAL HIGH (ref 180–914)

## 2023-01-22 LAB — FERRITIN: Ferritin: 54 ng/mL (ref 11–307)

## 2023-01-23 ENCOUNTER — Inpatient Hospital Stay: Payer: PPO

## 2023-01-23 ENCOUNTER — Inpatient Hospital Stay (HOSPITAL_BASED_OUTPATIENT_CLINIC_OR_DEPARTMENT_OTHER): Payer: PPO | Admitting: Medical Oncology

## 2023-01-23 ENCOUNTER — Encounter: Payer: Self-pay | Admitting: Medical Oncology

## 2023-01-23 VITALS — BP 160/100 | HR 80 | Temp 96.8°F | Wt 202.0 lb

## 2023-01-23 DIAGNOSIS — D509 Iron deficiency anemia, unspecified: Secondary | ICD-10-CM

## 2023-01-23 DIAGNOSIS — E538 Deficiency of other specified B group vitamins: Secondary | ICD-10-CM | POA: Diagnosis not present

## 2023-01-23 NOTE — Progress Notes (Signed)
No Venofer today per PA

## 2023-01-23 NOTE — Patient Instructions (Signed)
Please follow-up with your primary care within 1-2 weeks regarding your abnormal blood pressure reading. Check your blood pressure at least 1-2 times a daily and keep a daily log of your blood pressure readings.

## 2023-01-23 NOTE — Progress Notes (Signed)
[facility] Office Visit Note  Patient Care Team: Smitty Cords, DO as PCP - General (Family Medicine) Dedra Skeens, PA-C (Orthopedic Surgery) Duanne Limerick, MD (Family Medicine)  CHIEF COMPLAINTS/PURPOSE OF CONSULTATION:  Iron/B12 deficiency  #  Oncology History   No history exists.   HISTORY OF PRESENTING ILLNESS: Alone.  Ambulating independently. [employer] Smiddy 72 y.o. female with history of iron deficiency and b12 deficiency who presents to clinic for follow up. I  She reports that she has been doing well since her last visit. She has continued to be seen by various specialists including cardiology. No recent significant changes to health. She denies any known bleeding episodes. Energy levels is good and stable. She continues to tolerate her oral iron supplement well. She is taking her B12 supplement daily.   Wt Readings from Last 3 Encounters:  01/23/23 202 lb (91.6 kg)  10/29/22 209 lb (94.8 kg)  09/18/22 216 lb (98 kg)    Review of Systems  Constitutional:  Negative for chills, diaphoresis, fever, malaise/fatigue and weight loss.  HENT:  Negative for nosebleeds and sore throat.   Eyes:  Negative for double vision.  Respiratory:  Negative for cough, hemoptysis, sputum production, shortness of breath and wheezing.   Cardiovascular:  Negative for chest pain, palpitations, orthopnea and leg swelling.  Gastrointestinal:  Negative for abdominal pain, blood in stool, constipation, diarrhea, heartburn, melena, nausea and vomiting.  Genitourinary:  Negative for dysuria, frequency and urgency.  Musculoskeletal:  Negative for back pain and joint pain.  Skin: Negative.  Negative for itching and rash.  Neurological:  Negative for dizziness, tingling, focal weakness, weakness and headaches.  Endo/Heme/Allergies:  Does not bruise/bleed easily.  Psychiatric/Behavioral:  Negative for depression. The patient is not nervous/anxious and does not have  insomnia.     MEDICAL HISTORY:  Past Medical History:  Diagnosis Date   Arthritis    right knee   Atrial fibrillation (HCC)    Carotid artery occlusion    Dizziness    GERD (gastroesophageal reflux disease)    Heart murmur    History of colon polyps    Hyperlipidemia    Hypertension    Iron deficiency    Stroke (HCC) 12/31/2012   no residual effects    SURGICAL HISTORY: Past Surgical History:  Procedure Laterality Date   CAROTID ENDARTERECTOMY     RIGHT  01/19/13   COLONOSCOPY  08/18/2012   COLONOSCOPY WITH PROPOFOL N/A 03/10/2018   Procedure: COLONOSCOPY WITH PROPOFOL;  Surgeon: Christena Deem, MD;  Location: [facility] ENDOSCOPY;  Service: Endoscopy;  Laterality: N/A;   DILATION AND CURETTAGE OF UTERUS     ENDARTERECTOMY Right 01/19/2013   Procedure: ENDARTERECTOMY CAROTID with patch angioplasty;  Surgeon: Fransisco Hertz, MD;  Location: [facility] OR;  Service: Vascular;  Laterality: Right;   ESOPHAGEAL DILATION     JOINT REPLACEMENT Left 04/15/2017   Knee   taotal knee arthorplasty Right 2021   TOTAL KNEE ARTHROPLASTY Right 12/01/2019   Procedure: TOTAL KNEE ARTHROPLASTY - RNFA;  Surgeon: Christena Flake, MD;  Location: [facility] ORS;  Service: Orthopedics;  Laterality: Right;   TUBAL LIGATION     UPPER GI ENDOSCOPY  08/18/2012    SOCIAL HISTORY: Social History   Socioeconomic History   Marital status: Widowed    Spouse name: Not on file   Number of children: 3   Years of education: Not on file   Highest education level: 12th grade  Occupational History  Occupation: retired  Tobacco Use   Smoking status: Never   Smokeless tobacco: Never   Tobacco comments:    Smoking cessation materials not required  Vaping Use   Vaping status: Never Used  Substance and Sexual Activity   Alcohol use: Yes    Alcohol/week: 1.0 standard drink of alcohol    Types: 1 Standard drinks or equivalent per week    Comment: Yearly   Drug use: No   Sexual activity: Never  Other Topics Concern    Not on file  Social History Narrative   Pt lives alone.    Social Determinants of Health   Financial Resource Strain: Low Risk  (06/06/2022)   Overall Financial Resource Strain (CARDIA)    Difficulty of Paying Living Expenses: Not hard at all  Food Insecurity: No Food Insecurity (09/19/2022)   Hunger Vital Sign    Worried About Running Out of Food in the Last Year: Never true    Ran Out of Food in the Last Year: Never true  Transportation Needs: No Transportation Needs (09/19/2022)   PRAPARE - Administrator, Civil Service (Medical): No    Lack of Transportation (Non-Medical): No  Physical Activity: Inactive (06/06/2022)   Exercise Vital Sign    Days of Exercise per Week: 0 days    Minutes of Exercise per Session: 0 min  Stress: No Stress Concern Present (06/06/2022)   [employer] of Occupational Health - Occupational Stress Questionnaire    Feeling of Stress : Only a little  Social Connections: Moderately Integrated (06/06/2022)   Social Connection and Isolation Panel [NHANES]    Frequency of Communication with Friends and Family: More than three times a week    Frequency of Social Gatherings with Friends and Family: More than three times a week    Attends Religious Services: More than 4 times per year    Active Member of [employer] or Organizations: Yes    Attends Banker Meetings: More than 4 times per year    Marital Status: Widowed  Intimate Partner Violence: Not At Risk (06/06/2022)   Humiliation, Afraid, Rape, and Kick questionnaire    Fear of Current or Ex-Partner: No    Emotionally Abused: No    Physically Abused: No    Sexually Abused: No    FAMILY HISTORY: Family History  Problem Relation Age of Onset   Stroke Mother    Deep vein thrombosis Mother    Heart disease Mother        Amputation-   Hypertension Mother    Alzheimer's disease Mother    Heart failure Father    Heart disease Father        CHF   Hypertension Father    Varicose  Veins Father    Parkinson's disease Father    Breast cancer Paternal Aunt 14    ALLERGIES:  is allergic to adhesive [tape].  MEDICATIONS:  Current Outpatient Medications  Medication Sig Dispense Refill   alendronate (FOSAMAX) 70 MG tablet TAKE 1 TABLET(70 MG) BY MOUTH 1 TIME A WEEK WITH A FULL GLASS OF WATER AND ON AN EMPTY STOMACH 12 tablet 1   amiodarone (PACERONE) 200 MG tablet Take by mouth.     apixaban (ELIQUIS) 5 MG TABS tablet Take 5 mg by mouth 2 (two) times daily.     Calcium Carbonate-Vitamin D3 600-400 MG-UNIT TABS Take 2 tablets by mouth daily.     clopidogrel (PLAVIX) 75 MG tablet TAKE 1 TABLET BY MOUTH DAILY 90 tablet  1   doxycycline (VIBRA-TABS) 100 MG tablet Take 1 tablet (100 mg total) by mouth 2 (two) times daily. For 10 days. Take with full glass of water, stay upright 30 min after taking. 20 tablet 0   famotidine (PEPCID) 20 MG tablet TAKE 1 TABLET(20 MG) BY MOUTH TWICE DAILY 180 tablet 1   furosemide (LASIX) 20 MG tablet Take 1 tablet (20 mg total) by mouth daily. 30 tablet 0   ibuprofen (ADVIL) 200 MG tablet Take by mouth.     Iron-Vitamin C 65-125 MG TABS Take 1 tablet by mouth 2 (two) times daily. (Patient taking differently: Take 1 tablet by mouth once a week.) 60 tablet 1   loratadine (CLARITIN) 10 MG tablet Take 1 tablet (10 mg total) by mouth continuous as needed for allergies. 30 tablet 6   losartan (COZAAR) 25 MG tablet Take 1 tablet (25 mg total) by mouth daily. 90 tablet 3   meclizine (ANTIVERT) 25 MG tablet Take 1 tablet (25 mg total) by mouth 3 (three) times daily as needed for dizziness. 30 tablet 0   metoprolol tartrate (LOPRESSOR) 50 MG tablet Take 1 tablet (50 mg total) by mouth 2 (two) times daily. 180 tablet 3   Multiple Vitamins-Minerals (CENTRUM SILVER ULTRA WOMENS) TABS Take 1 tablet by mouth daily.     mupirocin ointment (BACTROBAN) 2 % Apply 1 Application topically 2 (two) times daily. For 1-2 weeks on topical for skin infection 22 g 0    simvastatin (ZOCOR) 20 MG tablet TAKE 1 TABLET(20 MG) BY MOUTH DAILY 90 tablet 3   traZODone (DESYREL) 50 MG tablet Take 0.5 tablets (25 mg total) by mouth at bedtime. 45 tablet 3   vitamin B-12 (CYANOCOBALAMIN) 1000 MCG tablet Take 1,000 mcg by mouth once a week.     memantine (NAMENDA) 5 MG tablet Take 5 mg by mouth 2 (two) times daily.     No current facility-administered medications for this visit.   PHYSICAL EXAMINATION: ECOG PERFORMANCE STATUS: 0 - Asymptomatic  Vitals:   01/23/23 1309 01/23/23 1346  BP: (!) 174/126 (!) 160/100  Pulse: 80   Temp: (!) 96.8 F (36 C)   SpO2: 100%    Filed Weights   01/23/23 1309  Weight: 202 lb (91.6 kg)     Physical Exam Constitutional:      Appearance: She is not ill-appearing.  Pulmonary:     Effort: Pulmonary effort is normal.  Musculoskeletal:        General: No deformity.     Right lower leg: No edema.     Left lower leg: No edema.  Skin:    General: Skin is warm and dry.  Neurological:     Mental Status: She is alert and oriented to person, place, and time. Mental status is at baseline.  Psychiatric:        Mood and Affect: Mood normal.        Behavior: Behavior normal.    LABORATORY DATA:  I have reviewed the data as listed Lab Results  Component Value Date   WBC 4.7 01/22/2023   HGB 13.7 01/22/2023   HCT 42.6 01/22/2023   MCV 91.2 01/22/2023   PLT 213 01/22/2023   Recent Labs    05/03/22 1046 07/01/22 1148 07/25/22 1448 10/23/22 0819 01/22/23 1400  NA 137 136 139 142 138  K 3.7 3.9 3.9 4.1 3.3*  CL 98 101 99 103 102  CO2 28 25 30 31 28   GLUCOSE 107* 105* 107* 88 113*  BUN 9 11 14 8 9   CREATININE 0.89 0.84 0.99 0.82 0.85  CALCIUM 8.6* 8.4* 8.5* 8.6 8.8*  GFRNONAA >60 >60 >60  --  >60  PROT 7.3 7.0  --  6.6 7.4  ALBUMIN 3.9 3.5  --   --  3.7  AST 30 25  --  17 24  ALT 20 14  --  10 17  ALKPHOS 59 60  --   --  75  BILITOT 1.5* 0.9  --  0.6 1.0  Iron/TIBC/Ferritin/ %Sat    Component Value Date/Time    IRON 79 01/22/2023 1400   TIBC 441 01/22/2023 1400   FERRITIN 54 01/22/2023 1400   FERRITIN 17 06/27/2018 0856   IRONPCTSAT 18 01/22/2023 1400    RADIOGRAPHIC STUDIES: I have personally reviewed the radiological images as listed and agreed with the findings in the report. No results found.  ASSESSMENT & PLAN:  Encounter Diagnoses  Name Primary?   Iron deficiency anemia, unspecified iron deficiency anemia type Yes   B12 deficiency     Iron deficiency # Iron deficiency - [ferritin- AUG 2022- 32' iron sat-18%]. EGD on 08/18/2012; Colonoscopy on 03/10/2018 revealed diverticulosis in the sigmoid colon, in the descending colon, in the transverse colon and in the ascending colon.  There was one 3 mm polyp at the hepatic flexure (tubular adenoma) and two 4 to 6 mm polyps (hyperplastic) in the sigmoid colon. On Plavix and Eliquis for afib and chf. Tolerating vitron-c BID. Today review of recent labs work shows a normal Hgb of 13.7, normal MCV of 91.2, normal platelet count of 213. B12 level of 1,351, normal kidney and liver function on CMP, iron saturation of 18% and ferritin of 52. No IV iron needed at this time.    # B12 deficiency: She will take her B12 every other day due to B12 level    DISPOSITION: 3 months- labs (cbc, bmp, ferritin, iron studies, b12), Dr Donneta Romberg- [place]  All questions were answered. The patient knows to call the clinic with any problems, questions or concerns.  Rushie Chestnut, PA-C 01/23/2023

## 2023-02-19 DIAGNOSIS — I5022 Chronic systolic (congestive) heart failure: Secondary | ICD-10-CM | POA: Diagnosis not present

## 2023-02-25 DIAGNOSIS — I5020 Unspecified systolic (congestive) heart failure: Secondary | ICD-10-CM | POA: Diagnosis not present

## 2023-02-25 DIAGNOSIS — I5022 Chronic systolic (congestive) heart failure: Secondary | ICD-10-CM | POA: Diagnosis not present

## 2023-03-12 DIAGNOSIS — H43813 Vitreous degeneration, bilateral: Secondary | ICD-10-CM | POA: Diagnosis not present

## 2023-03-12 DIAGNOSIS — H2513 Age-related nuclear cataract, bilateral: Secondary | ICD-10-CM | POA: Diagnosis not present

## 2023-03-13 DIAGNOSIS — K219 Gastro-esophageal reflux disease without esophagitis: Secondary | ICD-10-CM | POA: Diagnosis not present

## 2023-03-13 DIAGNOSIS — I10 Essential (primary) hypertension: Secondary | ICD-10-CM | POA: Diagnosis not present

## 2023-03-13 DIAGNOSIS — I5022 Chronic systolic (congestive) heart failure: Secondary | ICD-10-CM | POA: Diagnosis not present

## 2023-03-13 DIAGNOSIS — Z01818 Encounter for other preprocedural examination: Secondary | ICD-10-CM | POA: Diagnosis not present

## 2023-03-13 DIAGNOSIS — I429 Cardiomyopathy, unspecified: Secondary | ICD-10-CM | POA: Diagnosis not present

## 2023-03-13 DIAGNOSIS — E669 Obesity, unspecified: Secondary | ICD-10-CM | POA: Diagnosis not present

## 2023-03-13 DIAGNOSIS — E785 Hyperlipidemia, unspecified: Secondary | ICD-10-CM | POA: Diagnosis not present

## 2023-03-13 DIAGNOSIS — I679 Cerebrovascular disease, unspecified: Secondary | ICD-10-CM | POA: Diagnosis not present

## 2023-03-13 DIAGNOSIS — I480 Paroxysmal atrial fibrillation: Secondary | ICD-10-CM | POA: Diagnosis not present

## 2023-03-13 DIAGNOSIS — R0602 Shortness of breath: Secondary | ICD-10-CM | POA: Diagnosis not present

## 2023-03-27 ENCOUNTER — Encounter: Payer: Self-pay | Admitting: Ophthalmology

## 2023-03-27 DIAGNOSIS — H2511 Age-related nuclear cataract, right eye: Secondary | ICD-10-CM | POA: Diagnosis not present

## 2023-04-04 NOTE — Discharge Instructions (Signed)

## 2023-04-08 ENCOUNTER — Other Ambulatory Visit: Payer: Self-pay

## 2023-04-08 ENCOUNTER — Ambulatory Visit
Admission: RE | Admit: 2023-04-08 | Discharge: 2023-04-08 | Disposition: A | Discharge status: Home / Self Care | Payer: PPO | Attending: Ophthalmology | Admitting: Ophthalmology

## 2023-04-08 ENCOUNTER — Ambulatory Visit: Payer: PPO | Admitting: Anesthesiology

## 2023-04-08 ENCOUNTER — Encounter: Payer: Self-pay | Admitting: Ophthalmology

## 2023-04-08 ENCOUNTER — Encounter
Admission: RE | Disposition: A | Discharge status: Home / Self Care | Payer: Self-pay | Source: Home / Self Care | Attending: Ophthalmology

## 2023-04-08 DIAGNOSIS — I4819 Other persistent atrial fibrillation: Secondary | ICD-10-CM | POA: Diagnosis not present

## 2023-04-08 DIAGNOSIS — I509 Heart failure, unspecified: Secondary | ICD-10-CM | POA: Diagnosis not present

## 2023-04-08 DIAGNOSIS — H2511 Age-related nuclear cataract, right eye: Secondary | ICD-10-CM | POA: Insufficient documentation

## 2023-04-08 DIAGNOSIS — I4891 Unspecified atrial fibrillation: Secondary | ICD-10-CM | POA: Insufficient documentation

## 2023-04-08 DIAGNOSIS — Z8673 Personal history of transient ischemic attack (TIA), and cerebral infarction without residual deficits: Secondary | ICD-10-CM | POA: Diagnosis not present

## 2023-04-08 DIAGNOSIS — K219 Gastro-esophageal reflux disease without esophagitis: Secondary | ICD-10-CM | POA: Diagnosis not present

## 2023-04-08 DIAGNOSIS — H269 Unspecified cataract: Secondary | ICD-10-CM | POA: Diagnosis not present

## 2023-04-08 DIAGNOSIS — I110 Hypertensive heart disease with heart failure: Secondary | ICD-10-CM | POA: Insufficient documentation

## 2023-04-08 HISTORY — PX: CATARACT EXTRACTION W/PHACO: SHX586

## 2023-04-08 SURGERY — PHACOEMULSIFICATION, CATARACT, WITH IOL INSERTION
Anesthesia: Monitor Anesthesia Care | Laterality: Right

## 2023-04-08 MED ORDER — LIDOCAINE HCL (PF) 2 % IJ SOLN
INTRAOCULAR | Status: DC | PRN
  Administered 2023-04-08: 1 mL via INTRAOCULAR

## 2023-04-08 MED ORDER — TETRACAINE HCL 0.5 % OP SOLN
OPHTHALMIC | Status: AC
  Filled 2023-04-08: qty 4

## 2023-04-08 MED ORDER — TETRACAINE HCL 0.5 % OP SOLN
1.0000 [drp] | OPHTHALMIC | Status: DC | PRN
  Administered 2023-04-08 (×3): 1 [drp] via OPHTHALMIC

## 2023-04-08 MED ORDER — ARMC OPHTHALMIC DILATING DROPS
OPHTHALMIC | Status: AC
  Filled 2023-04-08: qty 0.5

## 2023-04-08 MED ORDER — MIDAZOLAM HCL 2 MG/2ML IJ SOLN
INTRAMUSCULAR | Status: AC
  Filled 2023-04-08: qty 2

## 2023-04-08 MED ORDER — SIGHTPATH DOSE#1 BSS IO SOLN
INTRAOCULAR | Status: DC | PRN
  Administered 2023-04-08: 15 mL via INTRAOCULAR

## 2023-04-08 MED ORDER — SODIUM CHLORIDE 0.9% FLUSH: 10.0000 mL | INTRAVENOUS | Status: DC | PRN

## 2023-04-08 MED ORDER — MOXIFLOXACIN HCL 0.5 % OP SOLN
OPHTHALMIC | Status: DC | PRN
  Administered 2023-04-08: .2 mL via OPHTHALMIC

## 2023-04-08 MED ORDER — FENTANYL CITRATE (PF) 100 MCG/2ML IJ SOLN
INTRAMUSCULAR | Status: DC | PRN
  Administered 2023-04-08: 100 ug via INTRAVENOUS

## 2023-04-08 MED ORDER — ARMC OPHTHALMIC DILATING DROPS
1.0000 | OPHTHALMIC | Status: DC | PRN
  Administered 2023-04-08 (×3): 1 via OPHTHALMIC

## 2023-04-08 MED ORDER — FENTANYL CITRATE (PF) 100 MCG/2ML IJ SOLN
INTRAMUSCULAR | Status: AC
  Filled 2023-04-08: qty 2

## 2023-04-08 MED ORDER — MIDAZOLAM HCL 2 MG/2ML IJ SOLN
INTRAMUSCULAR | Status: DC | PRN
  Administered 2023-04-08: 2 mg via INTRAVENOUS

## 2023-04-08 MED ORDER — SIGHTPATH DOSE#1 BSS IO SOLN
INTRAOCULAR | Status: DC | PRN
  Administered 2023-04-08: 79 mL via OPHTHALMIC

## 2023-04-08 MED ORDER — SIGHTPATH DOSE#1 NA HYALUR & NA CHOND-NA HYALUR IO KIT
PACK | INTRAOCULAR | Status: DC | PRN
  Administered 2023-04-08: 1 via OPHTHALMIC

## 2023-04-08 SURGICAL SUPPLY — 11 items
CATARACT SUITE SIGHTPATH (MISCELLANEOUS) ×1 IMPLANT
DISSECTOR HYDRO NUCLEUS 50X22 (MISCELLANEOUS) ×1 IMPLANT
FEE CATARACT SUITE SIGHTPATH (MISCELLANEOUS) ×1 IMPLANT
GLOVE SURG GAMMEX PI TX LF 7.5 (GLOVE) ×1 IMPLANT
GLOVE SURG SYN 8.5 E (GLOVE) ×1 IMPLANT
GLOVE SURG SYN 8.5 PF PI (GLOVE) ×1 IMPLANT
LENS IOL TECNIS EYHANCE 22.5 (Intraocular Lens) IMPLANT
NDL FILTER BLUNT 18X1 1/2 (NEEDLE) ×1 IMPLANT
NEEDLE FILTER BLUNT 18X1 1/2 (NEEDLE) ×1 IMPLANT
SYR 3ML LL SCALE MARK (SYRINGE) ×1 IMPLANT
SYR 5ML LL (SYRINGE) ×1 IMPLANT

## 2023-04-08 NOTE — H&P (Signed)
[facility] [place]   Primary Care Physician:  Smitty Cords, DO Ophthalmologist: Dr. Willey Blade  Pre-Procedure History & Physical: HPI:  Ellen Baker is a 72 y.o. female here for cataract surgery.   Past Medical History:  Diagnosis Date   Arthritis    right knee   Atrial fibrillation (HCC)    Carotid artery occlusion    Dizziness    GERD (gastroesophageal reflux disease)    Heart murmur    History of colon polyps    Hyperlipidemia    Hypertension    Iron deficiency    Stroke (HCC) 12/31/2012   no residual effects    Past Surgical History:  Procedure Laterality Date   CAROTID ENDARTERECTOMY     RIGHT  01/19/13   COLONOSCOPY  08/18/2012   COLONOSCOPY WITH PROPOFOL N/A 03/10/2018   Procedure: COLONOSCOPY WITH PROPOFOL;  Surgeon: Christena Deem, MD;  Location: [facility] ENDOSCOPY;  Service: Endoscopy;  Laterality: N/A;   DILATION AND CURETTAGE OF UTERUS     ENDARTERECTOMY Right 01/19/2013   Procedure: ENDARTERECTOMY CAROTID with patch angioplasty;  Surgeon: Fransisco Hertz, MD;  Location: [facility] OR;  Service: Vascular;  Laterality: Right;   ESOPHAGEAL DILATION     JOINT REPLACEMENT Left 04/15/2017   Knee   taotal knee arthorplasty Right 2021   TOTAL KNEE ARTHROPLASTY Right 12/01/2019   Procedure: TOTAL KNEE ARTHROPLASTY - RNFA;  Surgeon: Christena Flake, MD;  Location: [facility] ORS;  Service: Orthopedics;  Laterality: Right;   TUBAL LIGATION     UPPER GI ENDOSCOPY  08/18/2012    Prior to Admission medications   Medication Sig Start Date End Date Taking? Authorizing Provider  alendronate (FOSAMAX) 70 MG tablet TAKE 1 TABLET(70 MG) BY MOUTH 1 TIME A WEEK WITH A FULL GLASS OF WATER AND ON AN EMPTY STOMACH 07/23/22  Yes Duanne Limerick, MD  amiodarone (PACERONE) 200 MG tablet Take by mouth. 04/30/22 06/24/23 Yes [provider]  apixaban (ELIQUIS) 5 MG TABS tablet Take 5 mg by mouth 2 (two) times daily.   Yes [provider]  Calcium Carbonate-Vitamin  D3 600-400 MG-UNIT TABS Take 2 tablets by mouth daily.   Yes [provider]  clopidogrel (PLAVIX) 75 MG tablet TAKE 1 TABLET BY MOUTH DAILY 07/23/22  Yes Duanne Limerick, MD  doxycycline (VIBRA-TABS) 100 MG tablet Take 1 tablet (100 mg total) by mouth 2 (two) times daily. For 10 days. Take with full glass of water, stay upright 30 min after taking. 09/18/22  Yes Karamalegos, Alexander J, DO  famotidine (PEPCID) 20 MG tablet TAKE 1 TABLET(20 MG) BY MOUTH TWICE DAILY 07/23/22  Yes Duanne Limerick, MD  furosemide (LASIX) 20 MG tablet Take 1 tablet (20 mg total) by mouth daily. 07/01/22 07/01/23 Yes Brimage, Vondra, DO  ibuprofen (ADVIL) 200 MG tablet Take by mouth.   Yes [provider]  Iron-Vitamin C 65-125 MG TABS Take 1 tablet by mouth 2 (two) times daily. Patient taking differently: Take 1 tablet by mouth once a week. 07/21/18  Yes Verlee Monte, NP  loratadine (CLARITIN) 10 MG tablet Take 1 tablet (10 mg total) by mouth continuous as needed for allergies. 03/31/18  Yes Duanne Limerick, MD  losartan (COZAAR) 25 MG tablet Take 1 tablet (25 mg total) by mouth daily. 07/31/22  Yes Karamalegos, Netta Neat, DO  meclizine (ANTIVERT) 25 MG tablet Take 1 tablet (25 mg total) by mouth 3 (three) times daily as needed for dizziness. 01/23/21  Yes  Duanne Limerick, MD  memantine (NAMENDA) 5 MG tablet Take 5 mg by mouth 2 (two) times daily.   Yes [provider]  metoprolol tartrate (LOPRESSOR) 50 MG tablet Take 1 tablet (50 mg total) by mouth 2 (two) times daily. 07/31/22 07/31/23 Yes Karamalegos, Netta Neat, DO  Multiple Vitamins-Minerals (CENTRUM SILVER ULTRA WOMENS) TABS Take 1 tablet by mouth daily.   Yes [provider]  mupirocin ointment (BACTROBAN) 2 % Apply 1 Application topically 2 (two) times daily. For 1-2 weeks on topical for skin infection 09/18/22  Yes Karamalegos, Netta Neat, DO  simvastatin (ZOCOR) 20 MG tablet TAKE 1 TABLET(20 MG) BY MOUTH DAILY 07/31/22  Yes Karamalegos,  Netta Neat, DO  traZODone (DESYREL) 50 MG tablet Take 0.5 tablets (25 mg total) by mouth at bedtime. 10/29/22  Yes Karamalegos, Netta Neat, DO  vitamin B-12 (CYANOCOBALAMIN) 1000 MCG tablet Take 1,000 mcg by mouth once a week.   Yes [provider]    Allergies as of 03/14/2023 - Review Complete 01/23/2023  Allergen Reaction Noted   Adhesive [tape] Rash 01/06/2013    Family History  Problem Relation Age of Onset   Stroke Mother    Deep vein thrombosis Mother    Heart disease Mother        Amputation-   Hypertension Mother    Alzheimer's disease Mother    Heart failure Father    Heart disease Father        CHF   Hypertension Father    Varicose Veins Father    Parkinson's disease Father    Breast cancer Paternal Aunt 49    Social History   Socioeconomic History   Marital status: Widowed    Spouse name: Not on file   Number of children: 3   Years of education: Not on file   Highest education level: 12th grade  Occupational History   Occupation: retired  Tobacco Use   Smoking status: Never   Smokeless tobacco: Never   Tobacco comments:    Smoking cessation materials not required  Vaping Use   Vaping status: Never Used  Substance and Sexual Activity   Alcohol use: Yes    Alcohol/week: 1.0 standard drink of alcohol    Types: 1 Standard drinks or equivalent per week    Comment: Yearly   Drug use: No   Sexual activity: Never  Other Topics Concern   Not on file  Social History Narrative   Pt lives alone.    Social Determinants of Health   Financial Resource Strain: Low Risk  (06/06/2022)   Overall Financial Resource Strain (CARDIA)    Difficulty of Paying Living Expenses: Not hard at all  Food Insecurity: No Food Insecurity (09/19/2022)   Hunger Vital Sign    Worried About Running Out of Food in the Last Year: Never true    Ran Out of Food in the Last Year: Never true  Transportation Needs: No Transportation Needs (09/19/2022)   PRAPARE - Therapist, art (Medical): No    Lack of Transportation (Non-Medical): No  Physical Activity: Inactive (06/06/2022)   Exercise Vital Sign    Days of Exercise per Week: 0 days    Minutes of Exercise per Session: 0 min  Stress: No Stress Concern Present (06/06/2022)   [employer] of Occupational Health - Occupational Stress Questionnaire    Feeling of Stress : Only a little  Social Connections: Moderately Integrated (06/06/2022)   Social Connection and Isolation Panel [NHANES]  Frequency of Communication with Friends and Family: More than three times a week    Frequency of Social Gatherings with Friends and Family: More than three times a week    Attends Religious Services: More than 4 times per year    Active Member of [employer] or Organizations: Yes    Attends Banker Meetings: More than 4 times per year    Marital Status: Widowed  Intimate Partner Violence: Not At Risk (06/06/2022)   Humiliation, Afraid, Rape, and Kick questionnaire    Fear of Current or Ex-Partner: No    Emotionally Abused: No    Physically Abused: No    Sexually Abused: No    Review of Systems: See HPI, otherwise negative ROS  Physical Exam: BP 133/67   Pulse 74   Temp (!) 97.2 F (36.2 C) (Temporal)   Resp (!) 21   Ht 5\' 4"  (1.626 m)   Wt 89.7 kg   SpO2 100%   BMI 33.94 kg/m  General:   Alert, cooperative in NAD Head:  Normocephalic and atraumatic. Respiratory:  Normal work of breathing. Cardiovascular:  RRR  Impression/Plan: Isabell Jarvis Weight is here for cataract surgery.  Risks, benefits, limitations, and alternatives regarding cataract surgery have been reviewed with the patient.  Questions have been answered.  All parties agreeable.   Willey Blade, MD  04/08/2023, 8:32 AM

## 2023-04-08 NOTE — Anesthesia Preprocedure Evaluation (Addendum)
Anesthesia Evaluation  Patient identified by MRN, date of birth, ID band Patient awake    Reviewed: Allergy & Precautions, NPO status , Patient's Chart, lab work & pertinent test results  History of Anesthesia Complications Negative for: history of anesthetic complications  Airway Mallampati: III  TM Distance: <3 FB Neck ROM: Full    Dental no notable dental hx. (+) Teeth Intact   Pulmonary neg pulmonary ROS, neg sleep apnea, neg COPD, Patient abstained from smoking.Not current smoker   Pulmonary exam normal breath sounds clear to auscultation       Cardiovascular Exercise Tolerance: Good METShypertension, Pt. on medications +CHF  (-) CAD and (-) Past MI + dysrhythmias Atrial Fibrillation  Rhythm:Irregular Rate:Bradycardia - Systolic murmurs Echocardiogram 2D complete: (09/10/22) MILD LV DYSFUNCTION (See above) NORMAL RIGHT VENTRICULAR SYSTOLIC FUNCTION VALVULAR REGURGITATION: TRIVIAL MR, TRIVIAL PR, TRIVIAL TR VALVULAR STENOSIS: MILD AS LVEF 45-50% ATRIAL FIBRILLATION NOTED THROUGHOUT EXAM POOR SOUND TRANSMISSION  NM Myocardial Perfusion SPECT multiple (stress and rest): (04/16/2022) IMPRESSION: Mildly abnormal myocardial perfusion scan no evidence of stress-induced myocardial ischemia ejection fraction was mildly reduced at 41% globally reduced conclusion this is a intermediate risk scan because of depressed left ventricular function no evidence of ischemia     Neuro/Psych CVA in 2014 CVA, No Residual Symptoms  negative psych ROS   GI/Hepatic ,GERD  Controlled,,(+)     (-) substance abuse    Endo/Other  neg diabetes    Renal/GU negative Renal ROS     Musculoskeletal  (+) Arthritis ,    Abdominal  (+) + obese  Peds  Hematology   Anesthesia Other Findings Past Medical History: No date: Arthritis     Comment:  right knee No date: Atrial fibrillation (HCC) No date: Carotid artery occlusion No date:  Dizziness No date: GERD (gastroesophageal reflux disease) No date: Heart murmur No date: History of colon polyps No date: Hyperlipidemia No date: Hypertension No date: Iron deficiency 12/31/2012: Stroke (HCC)     Comment:  no residual effects  Reproductive/Obstetrics                             Anesthesia Physical Anesthesia Plan  ASA: 3  Anesthesia Plan: MAC   Post-op Pain Management:    Induction: Intravenous  PONV Risk Score and Plan: 2 and Midazolam  Airway Management Planned: Nasal Cannula  Additional Equipment:   Intra-op Plan:   Post-operative Plan:   Informed Consent: I have reviewed the patients History and Physical, chart, labs and discussed the procedure including the risks, benefits and alternatives for the proposed anesthesia with the patient or authorized representative who has indicated his/her understanding and acceptance.       Plan Discussed with: CRNA and Surgeon  Anesthesia Plan Comments: (Explained risks of anesthesia, including PONV, and rare emergencies such as cardiac events, respiratory problems, and allergic reactions, requiring invasive intervention. Discussed the role of CRNA in patient's perioperative care. Patient understands. )       Anesthesia Quick Evaluation

## 2023-04-08 NOTE — Plan of Care (Signed)
[facility] Tonsillectomy/Adenoidectomy, Postoperative PEDS care plan entered in error.

## 2023-04-08 NOTE — Transfer of Care (Signed)
Immediate Anesthesia Transfer of Care Note  Patient: Ellen Baker  Procedure(s) Performed: CATARACT EXTRACTION PHACO AND INTRAOCULAR LENS PLACEMENT (IOC) RIGHT 7.70 00:52.9 (Right)  Patient Location: PACU  Anesthesia Type: MAC  Level of Consciousness: awake, alert  and patient cooperative  Airway and Oxygen Therapy: Patient Spontanous Breathing and Patient connected to supplemental oxygen  Post-op Assessment: Post-op Vital signs reviewed, Patient's Cardiovascular Status Stable, Respiratory Function Stable, Patent Airway and No signs of Nausea or vomiting  Post-op Vital Signs: Reviewed and stable  Complications: No notable events documented.

## 2023-04-08 NOTE — Op Note (Signed)
OPERATIVE NOTE  Ellen Baker [id number] 04/08/2023   PREOPERATIVE DIAGNOSIS:  Nuclear sclerotic cataract right eye.  H25.11   POSTOPERATIVE DIAGNOSIS:    Nuclear sclerotic cataract right eye.     PROCEDURE:  Phacoemusification with posterior chamber intraocular lens placement of the right eye   LENS:   Implant Name Type Inv. Item Serial No. Manufacturer Lot No. LRB No. Used Action  LENS IOL TECNIS EYHANCE 22.5 - [id number] Intraocular Lens LENS IOL TECNIS EYHANCE 22.5 [id number] [employer]IGHTPATH  Right 1 Implanted       Procedure(s): CATARACT EXTRACTION PHACO AND INTRAOCULAR LENS PLACEMENT (IOC) RIGHT 7.70 00:52.9 (Right)  SURGEON:  Willey Blade, MD, MPH  ANESTHESIOLOGIST: Anesthesiologist: Corinda Gubler, MD CRNA: Domenic Moras, CRNA   ANESTHESIA:  Topical with tetracaine drops augmented with 1% preservative-free intracameral lidocaine.  ESTIMATED BLOOD LOSS: less than 1 mL.   COMPLICATIONS:  None.   DESCRIPTION OF PROCEDURE:  The patient was identified in the holding room and transported to the operating room and placed in the supine position under the operating microscope.  The right eye was identified as the operative eye and it was prepped and draped in the usual sterile ophthalmic fashion.   A 1.0 millimeter clear-corneal paracentesis was made at the 10:30 position. 0.5 ml of preservative-free 1% lidocaine with epinephrine was injected into the anterior chamber.  The anterior chamber was filled with viscoelastic.  A 2.4 millimeter keratome was used to make a near-clear corneal incision at the 8:00 position.  A curvilinear capsulorrhexis was made with a cystotome and capsulorrhexis forceps.  Balanced salt solution was used to hydrodissect and hydrodelineate the nucleus.   Phacoemulsification was then used in stop and chop fashion to remove the lens nucleus and epinucleus.  The remaining cortex was then removed using the irrigation and aspiration handpiece. Viscoelastic was  then placed into the capsular bag to distend it for lens placement.  A lens was then injected into the capsular bag.  The remaining viscoelastic was aspirated.   Wounds were hydrated with balanced salt solution.  The anterior chamber was inflated to a physiologic pressure with balanced salt solution.   Intracameral vigamox 0.1 mL undiluted was injected into the eye and a drop placed onto the ocular surface.  No wound leaks were noted.  The patient was taken to the recovery room in stable condition without complications of anesthesia or surgery  Willey Blade 04/08/2023, 9:03 AM

## 2023-04-08 NOTE — Anesthesia Postprocedure Evaluation (Signed)
Anesthesia Post Note  Patient: Ellen Baker  Procedure(s) Performed: CATARACT EXTRACTION PHACO AND INTRAOCULAR LENS PLACEMENT (IOC) RIGHT 7.70 00:52.9 (Right)  Patient location during evaluation: PACU Anesthesia Type: MAC Level of consciousness: awake and alert Pain management: pain level controlled Vital Signs Assessment: post-procedure vital signs reviewed and stable Respiratory status: spontaneous breathing, nonlabored ventilation, respiratory function stable and patient connected to nasal cannula oxygen Cardiovascular status: stable and blood pressure returned to baseline Postop Assessment: no apparent nausea or vomiting Anesthetic complications: no   No notable events documented.   Last Vitals:  Vitals:   04/08/23 0753 04/08/23 0905  BP: 133/67 121/88  Pulse: 74 70  Resp: (!) 21 15  Temp: (!) 36.2 C (!) 36.2 C  SpO2: 100% 94%    Last Pain:  Vitals:   04/08/23 0905  TempSrc:   PainSc: 0-No pain                 Corinda Gubler

## 2023-04-09 ENCOUNTER — Encounter: Payer: Self-pay | Admitting: Ophthalmology

## 2023-04-09 DIAGNOSIS — H2512 Age-related nuclear cataract, left eye: Secondary | ICD-10-CM | POA: Diagnosis not present

## 2023-04-25 ENCOUNTER — Encounter: Payer: Self-pay | Admitting: Nurse Practitioner

## 2023-04-25 ENCOUNTER — Inpatient Hospital Stay: Payer: PPO | Admitting: Nurse Practitioner

## 2023-04-25 ENCOUNTER — Inpatient Hospital Stay: Payer: PPO | Attending: Internal Medicine

## 2023-04-25 ENCOUNTER — Inpatient Hospital Stay: Payer: PPO

## 2023-04-25 VITALS — BP 123/77 | HR 92 | Temp 97.4°F | Wt 199.0 lb

## 2023-04-25 DIAGNOSIS — I110 Hypertensive heart disease with heart failure: Secondary | ICD-10-CM | POA: Insufficient documentation

## 2023-04-25 DIAGNOSIS — I509 Heart failure, unspecified: Secondary | ICD-10-CM | POA: Insufficient documentation

## 2023-04-25 DIAGNOSIS — M129 Arthropathy, unspecified: Secondary | ICD-10-CM | POA: Diagnosis not present

## 2023-04-25 DIAGNOSIS — Z8673 Personal history of transient ischemic attack (TIA), and cerebral infarction without residual deficits: Secondary | ICD-10-CM | POA: Diagnosis not present

## 2023-04-25 DIAGNOSIS — E785 Hyperlipidemia, unspecified: Secondary | ICD-10-CM | POA: Diagnosis not present

## 2023-04-25 DIAGNOSIS — Z79899 Other long term (current) drug therapy: Secondary | ICD-10-CM | POA: Insufficient documentation

## 2023-04-25 DIAGNOSIS — R531 Weakness: Secondary | ICD-10-CM | POA: Diagnosis not present

## 2023-04-25 DIAGNOSIS — D509 Iron deficiency anemia, unspecified: Secondary | ICD-10-CM

## 2023-04-25 DIAGNOSIS — E538 Deficiency of other specified B group vitamins: Secondary | ICD-10-CM | POA: Diagnosis not present

## 2023-04-25 DIAGNOSIS — I2510 Atherosclerotic heart disease of native coronary artery without angina pectoris: Secondary | ICD-10-CM | POA: Diagnosis not present

## 2023-04-25 DIAGNOSIS — K219 Gastro-esophageal reflux disease without esophagitis: Secondary | ICD-10-CM | POA: Insufficient documentation

## 2023-04-25 DIAGNOSIS — E611 Iron deficiency: Secondary | ICD-10-CM | POA: Insufficient documentation

## 2023-04-25 DIAGNOSIS — R011 Cardiac murmur, unspecified: Secondary | ICD-10-CM | POA: Diagnosis not present

## 2023-04-25 DIAGNOSIS — R5383 Other fatigue: Secondary | ICD-10-CM | POA: Diagnosis not present

## 2023-04-25 DIAGNOSIS — I10 Essential (primary) hypertension: Secondary | ICD-10-CM | POA: Diagnosis not present

## 2023-04-25 DIAGNOSIS — Z7901 Long term (current) use of anticoagulants: Secondary | ICD-10-CM | POA: Insufficient documentation

## 2023-04-25 DIAGNOSIS — I4891 Unspecified atrial fibrillation: Secondary | ICD-10-CM | POA: Diagnosis not present

## 2023-04-25 DIAGNOSIS — Z803 Family history of malignant neoplasm of breast: Secondary | ICD-10-CM | POA: Insufficient documentation

## 2023-04-25 DIAGNOSIS — Z7902 Long term (current) use of antithrombotics/antiplatelets: Secondary | ICD-10-CM | POA: Diagnosis not present

## 2023-04-25 LAB — CBC WITH DIFFERENTIAL/PLATELET
Abs Immature Granulocytes: 0.01 10*3/uL (ref 0.00–0.07)
Basophils Absolute: 0 10*3/uL (ref 0.0–0.1)
Basophils Relative: 1 %
Eosinophils Absolute: 0.2 10*3/uL (ref 0.0–0.5)
Eosinophils Relative: 5 %
HCT: 37.9 % (ref 36.0–46.0)
Hemoglobin: 12.5 g/dL (ref 12.0–15.0)
Immature Granulocytes: 0 %
Lymphocytes Relative: 26 %
Lymphs Abs: 1 10*3/uL (ref 0.7–4.0)
MCH: 30.6 pg (ref 26.0–34.0)
MCHC: 33 g/dL (ref 30.0–36.0)
MCV: 92.7 fL (ref 80.0–100.0)
Monocytes Absolute: 0.5 10*3/uL (ref 0.1–1.0)
Monocytes Relative: 14 %
Neutro Abs: 2.1 10*3/uL (ref 1.7–7.7)
Neutrophils Relative %: 54 %
Platelets: 211 10*3/uL (ref 150–400)
RBC: 4.09 MIL/uL (ref 3.87–5.11)
RDW: 13 % (ref 11.5–15.5)
WBC: 3.9 10*3/uL — ABNORMAL LOW (ref 4.0–10.5)
nRBC: 0 % (ref 0.0–0.2)

## 2023-04-25 LAB — COMPREHENSIVE METABOLIC PANEL
ALT: 18 U/L (ref 0–44)
AST: 22 U/L (ref 15–41)
Albumin: 3.4 g/dL — ABNORMAL LOW (ref 3.5–5.0)
Alkaline Phosphatase: 63 U/L (ref 38–126)
Anion gap: 7 (ref 5–15)
BUN: 15 mg/dL (ref 8–23)
CO2: 27 mmol/L (ref 22–32)
Calcium: 8.6 mg/dL — ABNORMAL LOW (ref 8.9–10.3)
Chloride: 104 mmol/L (ref 98–111)
Creatinine, Ser: 0.99 mg/dL (ref 0.44–1.00)
GFR, Estimated: >60 mL/min (ref >60)
Glucose, Bld: 126 mg/dL — ABNORMAL HIGH (ref 70–99)
Potassium: 3.9 mmol/L (ref 3.5–5.1)
Sodium: 138 mmol/L (ref 135–145)
Total Bilirubin: 0.5 mg/dL (ref <1.2)
Total Protein: 6.8 g/dL (ref 6.5–8.1)

## 2023-04-25 LAB — IRON AND TIBC
Iron: 56 ug/dL (ref 28–170)
Saturation Ratios: 13 % (ref 10.4–31.8)
TIBC: 426 ug/dL (ref 250–450)
UIBC: 370 ug/dL

## 2023-04-25 LAB — FERRITIN: Ferritin: 40 ng/mL (ref 11–307)

## 2023-04-25 NOTE — Discharge Instructions (Signed)

## 2023-04-25 NOTE — Progress Notes (Signed)
[facility] CONSULT NOTE  Patient Care Team: Smitty Cords, DO as PCP - General (Family Medicine) Dedra Skeens, PA-C (Orthopedic Surgery) Duanne Limerick, MD (Family Medicine) Earna Coder, MD as Consulting Physician (Oncology)  CHIEF COMPLAINTS/PURPOSE OF CONSULTATION: Iron/B12 deficiency  Oncology History   No history exists.    HISTORY OF PRESENTING ILLNESS: Alone.  Ambulating independently. Ellen Baker 72 y.o.  female history of iron deficiency/B12 deficiency is here for follow-up. She has had cataract surgery and is planning for other eye. She continues oral iron and has been taking extra pill every other day. She presents bottle of memantine and is unsure of reason for medication. Denies black or bloody stools. Denies nausea, vomiting. Denies vaginal bleeding. No tingling or numbness. Denies falls.    Review of Systems  Constitutional:  Positive for malaise/fatigue. Negative for chills, diaphoresis, fever and weight loss.  HENT:  Negative for nosebleeds and sore throat.   Eyes:  Negative for double vision.  Respiratory:  Negative for cough, hemoptysis, sputum production, shortness of breath and wheezing.   Cardiovascular:  Negative for chest pain, palpitations, orthopnea and leg swelling.  Gastrointestinal:  Negative for abdominal pain, blood in stool, constipation, diarrhea, heartburn, melena, nausea and vomiting.  Genitourinary:  Negative for dysuria, frequency, hematuria and urgency.  Musculoskeletal:  Negative for back pain, falls and joint pain.  Skin: Negative.  Negative for itching and rash.  Neurological:  Negative for dizziness, tingling, focal weakness, weakness and headaches.  Endo/Heme/Allergies:  Does not bruise/bleed easily.  Psychiatric/Behavioral:  Positive for memory loss. Negative for depression. The patient is not nervous/anxious and does not have insomnia.      MEDICAL HISTORY:  Past Medical History:  Diagnosis  Date   Arthritis    right knee   Atrial fibrillation (HCC)    Carotid artery occlusion    Dizziness    GERD (gastroesophageal reflux disease)    Heart murmur    History of colon polyps    Hyperlipidemia    Hypertension    Iron deficiency    Stroke (HCC) 12/31/2012   no residual effects    SURGICAL HISTORY: Past Surgical History:  Procedure Laterality Date   CAROTID ENDARTERECTOMY     RIGHT  01/19/13   CATARACT EXTRACTION W/PHACO Right 04/08/2023   Procedure: CATARACT EXTRACTION PHACO AND INTRAOCULAR LENS PLACEMENT (IOC) RIGHT 7.70 00:52.9;  Surgeon: Nevada Crane, MD;  Location: [facility];  Service: Ophthalmology;  Laterality: Right;   COLONOSCOPY  08/18/2012   COLONOSCOPY WITH PROPOFOL N/A 03/10/2018   Procedure: COLONOSCOPY WITH PROPOFOL;  Surgeon: Christena Deem, MD;  Location: [facility] END[facility]COPY;  Service: Endoscopy;  Laterality: N/A;   DILATION AND CURETTAGE OF UTERUS     ENDARTERECTOMY Right 01/19/2013   Procedure: ENDARTERECTOMY CAROTID with patch angioplasty;  Surgeon: Fransisco Hertz, MD;  Location: [facility];  Service: Vascular;  Laterality: Right;   ESOPHAGEAL DILATION     JOINT REPLACEMENT Left 04/15/2017   Knee   taotal knee arthorplasty Right 2021   TOTAL KNEE ARTHROPLASTY Right 12/01/2019   Procedure: TOTAL KNEE ARTHROPLASTY - RNFA;  Surgeon: Christena Flake, MD;  Location: [facility] ORS;  Service: Orthopedics;  Laterality: Right;   TUBAL LIGATION     UPPER GI ENDOSCOPY  08/18/2012    SOCIAL HISTORY: Social History   Socioeconomic History   Marital status: Widowed    Spouse name: Not on file   Number of children: 3   Years of education: Not  on file   Highest education level: 12th grade  Occupational History   Occupation: retired  Tobacco Use   Smoking status: Never   Smokeless tobacco: Never   Tobacco comments:    Smoking cessation materials not required  Vaping Use   Vaping status: Never Used  Substance and Sexual Activity   Alcohol use: Yes     Alcohol/week: 1.0 standard drink of alcohol    Types: 1 Standard drinks or equivalent per week    Comment: Yearly   Drug use: No   Sexual activity: Never  Other Topics Concern   Not on file  Social History Narrative   Pt lives alone.    Social Determinants of Health   Financial Resource Strain: Low Risk  (06/06/2022)   Overall Financial Resource Strain (CARDIA)    Difficulty of Paying Living Expenses: Not hard at all  Food Insecurity: No Food Insecurity (09/19/2022)   Hunger Vital Sign    Worried About Running Out of Food in the Last Year: Never true    Ran Out of Food in the Last Year: Never true  Transportation Needs: No Transportation Needs (09/19/2022)   PRAPARE - Administrator, Civil Service (Medical): No    Lack of Transportation (Non-Medical): No  Physical Activity: Inactive (06/06/2022)   Exercise Vital Sign    Days of Exercise per Week: 0 days    Minutes of Exercise per Session: 0 min  Stress: No Stress Concern Present (06/06/2022)   [employer] of Occupational Health - Occupational Stress Questionnaire    Feeling of Stress : Only a little  Social Connections: Moderately Integrated (06/06/2022)   Social Connection and Isolation Panel [NHANES]    Frequency of Communication with Friends and Family: More than three times a week    Frequency of Social Gatherings with Friends and Family: More than three times a week    Attends Religious Services: More than 4 times per year    Active Member of [employer] or Organizations: Yes    Attends Banker Meetings: More than 4 times per year    Marital Status: Widowed  Intimate Partner Violence: Not At Risk (06/06/2022)   Humiliation, Afraid, Rape, and Kick questionnaire    Fear of Current or Ex-Partner: No    Emotionally Abused: No    Physically Abused: No    Sexually Abused: No    FAMILY HISTORY: Family History  Problem Relation Age of Onset   Stroke Mother    Deep vein thrombosis Mother    Heart  disease Mother        Amputation-   Hypertension Mother    Alzheimer's disease Mother    Heart failure Father    Heart disease Father        CHF   Hypertension Father    Varicose Veins Father    Parkinson's disease Father    Breast cancer Paternal Aunt 43    ALLERGIES:  is allergic to adhesive [tape].  MEDICATIONS:  Current Outpatient Medications  Medication Sig Dispense Refill   alendronate (FOSAMAX) 70 MG tablet TAKE 1 TABLET(70 MG) BY MOUTH 1 TIME A WEEK WITH A FULL GLASS OF WATER AND ON AN EMPTY STOMACH 12 tablet 1   amiodarone (PACERONE) 200 MG tablet Take by mouth.     apixaban (ELIQUIS) 5 MG TABS tablet Take 5 mg by mouth 2 (two) times daily.     Calcium Carbonate-Vitamin D3 600-400 MG-UNIT TABS Take 2 tablets by mouth daily.  clopidogrel (PLAVIX) 75 MG tablet TAKE 1 TABLET BY MOUTH DAILY 90 tablet 1   doxycycline (VIBRA-TABS) 100 MG tablet Take 1 tablet (100 mg total) by mouth 2 (two) times daily. For 10 days. Take with full glass of water, stay upright 30 min after taking. 20 tablet 0   famotidine (PEPCID) 20 MG tablet TAKE 1 TABLET(20 MG) BY MOUTH TWICE DAILY 180 tablet 1   furosemide (LASIX) 20 MG tablet Take 1 tablet (20 mg total) by mouth daily. 30 tablet 0   ibuprofen (ADVIL) 200 MG tablet Take by mouth.     Iron-Vitamin C 65-125 MG TABS Take 1 tablet by mouth 2 (two) times daily. (Patient taking differently: Take 1 tablet by mouth once a week.) 60 tablet 1   loratadine (CLARITIN) 10 MG tablet Take 1 tablet (10 mg total) by mouth continuous as needed for allergies. 30 tablet 6   losartan (COZAAR) 25 MG tablet Take 1 tablet (25 mg total) by mouth daily. 90 tablet 3   meclizine (ANTIVERT) 25 MG tablet Take 1 tablet (25 mg total) by mouth 3 (three) times daily as needed for dizziness. 30 tablet 0   memantine (NAMENDA) 5 MG tablet Take 5 mg by mouth 2 (two) times daily.     metoprolol tartrate (LOPRESSOR) 50 MG tablet Take 1 tablet (50 mg total) by mouth 2 (two) times  daily. 180 tablet 3   Multiple Vitamins-Minerals (CENTRUM SILVER ULTRA WOMENS) TABS Take 1 tablet by mouth daily.     mupirocin ointment (BACTROBAN) 2 % Apply 1 Application topically 2 (two) times daily. For 1-2 weeks on topical for skin infection 22 g 0   simvastatin (ZOCOR) 20 MG tablet TAKE 1 TABLET(20 MG) BY MOUTH DAILY 90 tablet 3   traZODone (DESYREL) 50 MG tablet Take 0.5 tablets (25 mg total) by mouth at bedtime. 45 tablet 3   vitamin B-12 (CYANOCOBALAMIN) 1000 MCG tablet Take 1,000 mcg by mouth once a week.     No current facility-administered medications for this visit.    PHYSICAL EXAMINATION: ECOG PERFORMANCE STATUS: 0 - Asymptomatic  Vitals:   04/25/23 1422  BP: 123/77  Pulse: 92  Temp: (!) 97.4 F (36.3 C)  SpO2: 100%   Filed Weights   04/25/23 1422  Weight: 199 lb (90.3 kg)   Physical Exam Vitals reviewed.  Constitutional:      Appearance: She is not ill-appearing.     Comments:     HENT:     Head: Normocephalic and atraumatic.  Cardiovascular:     Rate and Rhythm: Normal rate and regular rhythm.  Pulmonary:     Effort: No respiratory distress.     Comments: Decreased breath sounds bilaterally at bases.  No wheeze or crackles Abdominal:     General: There is no distension.     Palpations: Abdomen is soft.     Tenderness: There is no abdominal tenderness. There is no guarding.  Musculoskeletal:        General: No tenderness.  Skin:    General: Skin is warm.     Coloration: Skin is not pale.  Neurological:     Mental Status: She is alert and oriented to person, place, and time.  Psychiatric:        Mood and Affect: Affect normal.        Behavior: Behavior is cooperative.    LABORATORY DATA:  I have reviewed the data as listed Lab Results  Component Value Date   WBC 3.9 (L) 04/25/2023  HGB 12.5 04/25/2023   HCT 37.9 04/25/2023   MCV 92.7 04/25/2023   PLT 211 04/25/2023   Recent Labs    07/01/22 1148 07/25/22 1448 10/23/22 0819  01/22/23 1400 04/25/23 1403  NA 136 139 142 138 138  K 3.9 3.9 4.1 3.3* 3.9  CL 101 99 103 102 104  CO2 25 30 31 28 27   GLUCOSE 105* 107* 88 113* 126*  BUN 11 14 8 9 15   CREATININE 0.84 0.99 0.82 0.85 0.99  CALCIUM 8.4* 8.5* 8.6 8.8* 8.6*  GFRNONAA >60 >60  --  >60 >60  PROT 7.0  --  6.6 7.4 6.8  ALBUMIN 3.5  --   --  3.7 3.4*  AST 25  --  17 24 22   ALT 14  --  10 17 18   ALKPHOS 60  --   --  75 63  BILITOT 0.9  --  0.6 1.0 0.5  Iron/TIBC/Ferritin/ %Sat    Component Value Date/Time   IRON 56 04/25/2023 1403   TIBC 426 04/25/2023 1403   FERRITIN 40 04/25/2023 1403   FERRITIN 17 06/27/2018 0856   IRONPCTSAT 13 04/25/2023 1403     RADIOGRAPHIC STUDIES: I have personally reviewed the radiological images as listed and agreed with the findings in the report. No results found.  ASSESSMENT & PLAN:   # Iron deficiency- Ferritin August 2022 - 32, iron sat 18%. On vitron-c. She has not required IV iron. Tolerating iron well. Encouraged compliance and reviewed dosing. Hmg stable at 12.5. Ferritin 40, iron sat 13%. Continue vitron c daily with extra tablet every other day. Hold IV iron.    # Etiology- EGD 08/18/12. Colonoscopy 03/10/18 revealed diverticulosis of sigmoid colon, descending colon, transverse colon, and ascending colon. Tubular adenoma of hepatic flexure. 2 4-6 mm polyps, hyperplastic in sigmoid colon. On Plavix and Eliquis. Counts have remained stable so hold additional workup for now.    # B12 deficiency: B12 pill once a week. August 2024 levels were normal to elevated.    # A-fib/CHF- on Eliquis and Plavix [per cardiology]  # Memory changes- on memantine 5 mg BID. Reviewed use and rationale for medication.    # DISPOSITION: 6 months- labs (cbc, cmp, ferritin, iron studies, b12), Dr Donneta Romberg - [place]  No problem-specific Assessment & Plan notes found for this encounter.  All questions were answered. The patient knows to call the clinic with any problems, questions or  concerns.   Alinda Dooms, NP 04/25/2023

## 2023-04-25 NOTE — Patient Instructions (Signed)
Continue oral iron/Vitron-C twice a day and extra pill every other day. Your numbers look good. Contact your pharmacy for a refill of the memantine which is your memory medication.

## 2023-04-26 ENCOUNTER — Other Ambulatory Visit: Payer: PPO

## 2023-04-26 ENCOUNTER — Ambulatory Visit: Payer: PPO

## 2023-04-26 ENCOUNTER — Ambulatory Visit: Payer: PPO | Admitting: Internal Medicine

## 2023-04-29 ENCOUNTER — Encounter: Payer: Self-pay | Admitting: Ophthalmology

## 2023-04-29 ENCOUNTER — Other Ambulatory Visit: Payer: Self-pay

## 2023-04-29 ENCOUNTER — Ambulatory Visit: Payer: PPO | Admitting: General Practice

## 2023-04-29 ENCOUNTER — Ambulatory Visit
Admission: RE | Admit: 2023-04-29 | Discharge: 2023-04-29 | Disposition: A | Discharge status: Home / Self Care | Payer: PPO | Attending: Ophthalmology | Admitting: Ophthalmology

## 2023-04-29 ENCOUNTER — Encounter
Admission: RE | Disposition: A | Discharge status: Home / Self Care | Payer: Self-pay | Source: Home / Self Care | Attending: Ophthalmology

## 2023-04-29 DIAGNOSIS — H2512 Age-related nuclear cataract, left eye: Secondary | ICD-10-CM | POA: Diagnosis not present

## 2023-04-29 DIAGNOSIS — Z8673 Personal history of transient ischemic attack (TIA), and cerebral infarction without residual deficits: Secondary | ICD-10-CM | POA: Insufficient documentation

## 2023-04-29 DIAGNOSIS — H269 Unspecified cataract: Secondary | ICD-10-CM | POA: Diagnosis not present

## 2023-04-29 DIAGNOSIS — I4819 Other persistent atrial fibrillation: Secondary | ICD-10-CM | POA: Diagnosis not present

## 2023-04-29 DIAGNOSIS — I10 Essential (primary) hypertension: Secondary | ICD-10-CM | POA: Insufficient documentation

## 2023-04-29 DIAGNOSIS — K219 Gastro-esophageal reflux disease without esophagitis: Secondary | ICD-10-CM | POA: Insufficient documentation

## 2023-04-29 HISTORY — PX: CATARACT EXTRACTION W/PHACO: SHX586

## 2023-04-29 SURGERY — PHACOEMULSIFICATION, CATARACT, WITH IOL INSERTION
Anesthesia: Monitor Anesthesia Care | Laterality: Left

## 2023-04-29 MED ORDER — TETRACAINE HCL 0.5 % OP SOLN
1.0000 [drp] | OPHTHALMIC | Status: DC | PRN
  Administered 2023-04-29 (×3): 1 [drp] via OPHTHALMIC

## 2023-04-29 MED ORDER — MIDAZOLAM HCL 2 MG/2ML IJ SOLN
INTRAMUSCULAR | Status: DC | PRN
  Administered 2023-04-29: 1 mg via INTRAVENOUS

## 2023-04-29 MED ORDER — FENTANYL CITRATE (PF) 100 MCG/2ML IJ SOLN
INTRAMUSCULAR | Status: DC | PRN
  Administered 2023-04-29: 25 ug via INTRAVENOUS

## 2023-04-29 MED ORDER — ARMC OPHTHALMIC DILATING DROPS
1.0000 | OPHTHALMIC | Status: DC | PRN
  Administered 2023-04-29 (×3): 1 via OPHTHALMIC

## 2023-04-29 MED ORDER — SIGHTPATH DOSE#1 BSS IO SOLN
INTRAOCULAR | Status: DC | PRN
  Administered 2023-04-29: 15 mL

## 2023-04-29 MED ORDER — MOXIFLOXACIN HCL 0.5 % OP SOLN
OPHTHALMIC | Status: DC | PRN
  Administered 2023-04-29: .2 mL via OPHTHALMIC

## 2023-04-29 MED ORDER — ARMC OPHTHALMIC DILATING DROPS
OPHTHALMIC | Status: AC
Start: 2023-04-29 — End: ?
  Filled 2023-04-29: qty 0.5

## 2023-04-29 MED ORDER — SODIUM CHLORIDE 0.9% FLUSH
INTRAVENOUS | Status: DC | PRN
  Administered 2023-04-29: 10 mL via INTRAVENOUS

## 2023-04-29 MED ORDER — MIDAZOLAM HCL 2 MG/2ML IJ SOLN
INTRAMUSCULAR | Status: AC
  Filled 2023-04-29: qty 2

## 2023-04-29 MED ORDER — SIGHTPATH DOSE#1 NA HYALUR & NA CHOND-NA HYALUR IO KIT
PACK | INTRAOCULAR | Status: DC | PRN
  Administered 2023-04-29: 1 via OPHTHALMIC

## 2023-04-29 MED ORDER — TETRACAINE HCL 0.5 % OP SOLN
OPHTHALMIC | Status: AC
Start: 2023-04-29 — End: ?
  Filled 2023-04-29: qty 4

## 2023-04-29 MED ORDER — FENTANYL CITRATE (PF) 100 MCG/2ML IJ SOLN
INTRAMUSCULAR | Status: AC
  Filled 2023-04-29: qty 2

## 2023-04-29 MED ORDER — LIDOCAINE HCL (PF) 2 % IJ SOLN
INTRAOCULAR | Status: DC | PRN
  Administered 2023-04-29: 1 mL via INTRAOCULAR

## 2023-04-29 MED ORDER — SIGHTPATH DOSE#1 BSS IO SOLN
INTRAOCULAR | Status: DC | PRN
  Administered 2023-04-29: 85 mL via OPHTHALMIC

## 2023-04-29 SURGICAL SUPPLY — 11 items
CATARACT SUITE SIGHTPATH (MISCELLANEOUS) ×1
DISSECTOR HYDRO NUCLEUS 50X22 (MISCELLANEOUS) ×1 IMPLANT
FEE CATARACT SUITE SIGHTPATH (MISCELLANEOUS) ×1 IMPLANT
GLOVE SURG GAMMEX PI TX LF 7.5 (GLOVE) ×1 IMPLANT
GLOVE SURG SYN 8.5 E (GLOVE) ×1
GLOVE SURG SYN 8.5 PF PI (GLOVE) ×1 IMPLANT
LENS IOL TECNIS EYHANCE 21.0 (Intraocular Lens) IMPLANT
NDL FILTER BLUNT 18X1 1/2 (NEEDLE) ×1 IMPLANT
NEEDLE FILTER BLUNT 18X1 1/2 (NEEDLE) ×1
SYR 3ML LL SCALE MARK (SYRINGE) ×1 IMPLANT
SYR 5ML LL (SYRINGE) ×1 IMPLANT

## 2023-04-29 NOTE — Anesthesia Postprocedure Evaluation (Signed)
Anesthesia Post Note  Patient: Ellen Baker  Procedure(s) Performed: CATARACT EXTRACTION PHACO AND INTRAOCULAR LENS PLACEMENT (IOC) LEFT 7.08 00:43.2 (Left)  Patient location during evaluation: PACU Anesthesia Type: MAC Level of consciousness: awake and alert Pain management: pain level controlled Vital Signs Assessment: post-procedure vital signs reviewed and stable Respiratory status: spontaneous breathing, nonlabored ventilation, respiratory function stable and patient connected to nasal cannula oxygen Cardiovascular status: stable and blood pressure returned to baseline Postop Assessment: no apparent nausea or vomiting Anesthetic complications: no   No notable events documented.   Last Vitals:  Vitals:   04/29/23 1127 04/29/23 1130  BP: 98/74 104/76  Pulse: 73   Resp: 20   Temp: (!) 36.1 C   SpO2:  99%    Last Pain:  Vitals:   04/29/23 1127  TempSrc:   PainSc: 3                  Angeles Paolucci C Kaidan Harpster

## 2023-04-29 NOTE — Op Note (Signed)
OPERATIVE NOTE  Ellen Baker [id number] 04/29/2023   PREOPERATIVE DIAGNOSIS:  Nuclear sclerotic cataract left eye.  H25.12   POSTOPERATIVE DIAGNOSIS:    Nuclear sclerotic cataract left eye.     PROCEDURE:  Phacoemusification with posterior chamber intraocular lens placement of the left eye   LENS:   Implant Name Type Inv. Item Serial No. Manufacturer Lot No. LRB No. Used Action  LENS IOL TECNIS EYHANCE 21.0 - [id number] Intraocular Lens LENS IOL TECNIS EYHANCE 21.0 [id number] [employer]  Left 1 Implanted      Procedure(s): CATARACT EXTRACTION PHACO AND INTRAOCULAR LENS PLACEMENT (IOC) LEFT 7.08 00:43.2 (Left)  SURGEON:  Willey Blade, MD, MPH   ANESTHESIA:  Topical with tetracaine drops augmented with 1% preservative-free intracameral lidocaine.  ESTIMATED BLOOD LOSS: <1 mL   COMPLICATIONS:  None.   DESCRIPTION OF PROCEDURE:  The patient was identified in the holding room and transported to the operating room and placed in the supine position under the operating microscope.  The left eye was identified as the operative eye and it was prepped and draped in the usual sterile ophthalmic fashion.   A 1.0 millimeter clear-corneal paracentesis was made at the 5:00 position. 0.5 ml of preservative-free 1% lidocaine with epinephrine was injected into the anterior chamber.  The anterior chamber was filled with viscoelastic.  A 2.4 millimeter keratome was used to make a near-clear corneal incision at the 2:00 position.  A curvilinear capsulorrhexis was made with a cystotome and capsulorrhexis forceps.  Balanced salt solution was used to hydrodissect and hydrodelineate the nucleus.   Phacoemulsification was then used in stop and chop fashion to remove the lens nucleus and epinucleus.  The remaining cortex was then removed using the irrigation and aspiration handpiece. Viscoelastic was then placed into the capsular bag to distend it for lens placement.  A lens was then injected into the  capsular bag.  The remaining viscoelastic was aspirated.   Wounds were hydrated with balanced salt solution.  The anterior chamber was inflated to a physiologic pressure with balanced salt solution.  Intracameral vigamox 0.1 mL undiltued was injected into the eye and a drop placed onto the ocular surface.  No wound leaks were noted.  The patient was taken to the recovery room in stable condition without complications of anesthesia or surgery  Willey Blade 04/29/2023, 11:24 AM

## 2023-04-29 NOTE — H&P (Signed)
[facility]   Primary Care Physician:  Smitty Cords, DO Ophthalmologist: Dr. Willey Blade  Pre-Procedure History & Physical: HPI:  Ellen Baker is a 72 y.o. female here for cataract surgery.   Past Medical History:  Diagnosis Date   Arthritis    right knee   Atrial fibrillation (HCC)    Carotid artery occlusion    Dizziness    GERD (gastroesophageal reflux disease)    Heart murmur    History of colon polyps    Hyperlipidemia    Hypertension    Iron deficiency    Stroke (HCC) 12/31/2012   no residual effects    Past Surgical History:  Procedure Laterality Date   CAROTID ENDARTERECTOMY     RIGHT  01/19/13   CATARACT EXTRACTION W/PHACO Right 04/08/2023   Procedure: CATARACT EXTRACTION PHACO AND INTRAOCULAR LENS PLACEMENT (IOC) RIGHT 7.70 00:52.9;  Surgeon: Nevada Crane, MD;  Location: [facility];  Service: Ophthalmology;  Laterality: Right;   COLONOSCOPY  08/18/2012   COLONOSCOPY WITH PROPOFOL N/A 03/10/2018   Procedure: COLONOSCOPY WITH PROPOFOL;  Surgeon: Christena Deem, MD;  Location: [facility] ENDOSCOPY;  Service: Endoscopy;  Laterality: N/A;   DILATION AND CURETTAGE OF UTERUS     ENDARTERECTOMY Right 01/19/2013   Procedure: ENDARTERECTOMY CAROTID with patch angioplasty;  Surgeon: Fransisco Hertz, MD;  Location: [facility] OR;  Service: Vascular;  Laterality: Right;   ESOPHAGEAL DILATION     JOINT REPLACEMENT Left 04/15/2017   Knee   taotal knee arthorplasty Right 2021   TOTAL KNEE ARTHROPLASTY Right 12/01/2019   Procedure: TOTAL KNEE ARTHROPLASTY - RNFA;  Surgeon: Christena Flake, MD;  Location: [facility] ORS;  Service: Orthopedics;  Laterality: Right;   TUBAL LIGATION     UPPER GI ENDOSCOPY  08/18/2012    Prior to Admission medications   Medication Sig Start Date End Date Taking? Authorizing Provider  amiodarone (PACERONE) 200 MG tablet Take by mouth. 04/30/22 06/24/23 Yes [provider]  apixaban (ELIQUIS) 5 MG TABS tablet Take 5  mg by mouth 2 (two) times daily.   Yes [provider]  Calcium Carbonate-Vitamin D3 600-400 MG-UNIT TABS Take 2 tablets by mouth daily.   Yes [provider]  clopidogrel (PLAVIX) 75 MG tablet TAKE 1 TABLET BY MOUTH DAILY 07/23/22  Yes Duanne Limerick, MD  famotidine (PEPCID) 20 MG tablet TAKE 1 TABLET(20 MG) BY MOUTH TWICE DAILY 07/23/22  Yes Duanne Limerick, MD  furosemide (LASIX) 20 MG tablet Take 1 tablet (20 mg total) by mouth daily. 07/01/22 07/01/23 Yes Brimage, Vondra, DO  ibuprofen (ADVIL) 200 MG tablet Take by mouth.   Yes [provider]  loratadine (CLARITIN) 10 MG tablet Take 1 tablet (10 mg total) by mouth continuous as needed for allergies. 03/31/18  Yes Duanne Limerick, MD  losartan (COZAAR) 25 MG tablet Take 1 tablet (25 mg total) by mouth daily. 07/31/22  Yes Karamalegos, Netta Neat, DO  memantine (NAMENDA) 5 MG tablet Take 5 mg by mouth 2 (two) times daily.   Yes [provider]  metoprolol tartrate (LOPRESSOR) 50 MG tablet Take 1 tablet (50 mg total) by mouth 2 (two) times daily. 07/31/22 07/31/23 Yes Karamalegos, Netta Neat, DO  Multiple Vitamins-Minerals (CENTRUM SILVER ULTRA WOMENS) TABS Take 1 tablet by mouth daily.   Yes [provider]  simvastatin (ZOCOR) 20 MG tablet TAKE 1 TABLET(20 MG) BY MOUTH DAILY 07/31/22  Yes Karamalegos, Netta Neat, DO  traZODone (DESYREL) 50 MG tablet Take  0.5 tablets (25 mg total) by mouth at bedtime. 10/29/22  Yes Karamalegos, Netta Neat, DO  vitamin B-12 (CYANOCOBALAMIN) 1000 MCG tablet Take 1,000 mcg by mouth once a week.   Yes [provider]  alendronate (FOSAMAX) 70 MG tablet TAKE 1 TABLET(70 MG) BY MOUTH 1 TIME A WEEK WITH A FULL GLASS OF WATER AND ON AN EMPTY STOMACH 07/23/22   Duanne Limerick, MD  doxycycline (VIBRA-TABS) 100 MG tablet Take 1 tablet (100 mg total) by mouth 2 (two) times daily. For 10 days. Take with full glass of water, stay upright 30 min after taking. 09/18/22   Karamalegos,  Netta Neat, DO  Iron-Vitamin C 65-125 MG TABS Take 1 tablet by mouth 2 (two) times daily. Patient taking differently: Take 1 tablet by mouth once a week. 07/21/18   Verlee Monte, NP  meclizine (ANTIVERT) 25 MG tablet Take 1 tablet (25 mg total) by mouth 3 (three) times daily as needed for dizziness. 01/23/21   Duanne Limerick, MD  mupirocin ointment (BACTROBAN) 2 % Apply 1 Application topically 2 (two) times daily. For 1-2 weeks on topical for skin infection 09/18/22   Smitty Cords, DO    Allergies as of 03/14/2023 - Review Complete 01/23/2023  Allergen Reaction Noted   Adhesive [tape] Rash 01/06/2013    Family History  Problem Relation Age of Onset   Stroke Mother    Deep vein thrombosis Mother    Heart disease Mother        Amputation-   Hypertension Mother    Alzheimer's disease Mother    Heart failure Father    Heart disease Father        CHF   Hypertension Father    Varicose Veins Father    Parkinson's disease Father    Breast cancer Paternal Aunt 67    Social History   Socioeconomic History   Marital status: Widowed    Spouse name: Not on file   Number of children: 3   Years of education: Not on file   Highest education level: 12th grade  Occupational History   Occupation: retired  Tobacco Use   Smoking status: Never   Smokeless tobacco: Never   Tobacco comments:    Smoking cessation materials not required  Vaping Use   Vaping status: Never Used  Substance and Sexual Activity   Alcohol use: Yes    Alcohol/week: 1.0 standard drink of alcohol    Types: 1 Standard drinks or equivalent per week    Comment: Yearly   Drug use: No   Sexual activity: Never  Other Topics Concern   Not on file  Social History Narrative   Pt lives alone.    Social Determinants of Health   Financial Resource Strain: Low Risk  (06/06/2022)   Overall Financial Resource Strain (CARDIA)    Difficulty of Paying Living Expenses: Not hard at all  Food Insecurity: No Food  Insecurity (09/19/2022)   Hunger Vital Sign    Worried About Running Out of Food in the Last Year: Never true    Ran Out of Food in the Last Year: Never true  Transportation Needs: No Transportation Needs (09/19/2022)   PRAPARE - Administrator, Civil Service (Medical): No    Lack of Transportation (Non-Medical): No  Physical Activity: Inactive (06/06/2022)   Exercise Vital Sign    Days of Exercise per Week: 0 days    Minutes of Exercise per Session: 0 min  Stress: No Stress Concern  Present (06/06/2022)   [employer] of Occupational Health - Occupational Stress Questionnaire    Feeling of Stress : Only a little  Social Connections: Moderately Integrated (06/06/2022)   Social Connection and Isolation Panel [NHANES]    Frequency of Communication with Friends and Family: More than three times a week    Frequency of Social Gatherings with Friends and Family: More than three times a week    Attends Religious Services: More than 4 times per year    Active Member of [employer] or Organizations: Yes    Attends Banker Meetings: More than 4 times per year    Marital Status: Widowed  Intimate Partner Violence: Not At Risk (06/06/2022)   Humiliation, Afraid, Rape, and Kick questionnaire    Fear of Current or Ex-Partner: No    Emotionally Abused: No    Physically Abused: No    Sexually Abused: No    Review of Systems: See HPI, otherwise negative ROS  Physical Exam: BP 118/81   Pulse 82   Temp 98.1 F (36.7 C) (Temporal)   Ht 5\' 4"  (1.626 m)   Wt 89.8 kg   SpO2 94%   BMI 33.99 kg/m  General:   Alert, cooperative in NAD Head:  Normocephalic and atraumatic. Respiratory:  Normal work of breathing. Cardiovascular:  RRR  Impression/Plan: Isabell Jarvis Gsell is here for cataract surgery.  Risks, benefits, limitations, and alternatives regarding cataract surgery have been reviewed with the patient.  Questions have been answered.  All parties  agreeable.   Willey Blade, MD  04/29/2023, 10:54 AM

## 2023-04-29 NOTE — Anesthesia Preprocedure Evaluation (Addendum)
Anesthesia Evaluation  Patient identified by MRN, date of birth, ID band Patient awake    Reviewed: Allergy & Precautions, H&P , NPO status , Patient's Chart, lab work & pertinent test results  Airway Mallampati: III  TM Distance: <3 FB Neck ROM: Full    Dental no notable dental hx.    Pulmonary neg pulmonary ROS   Pulmonary exam normal breath sounds clear to auscultation       Cardiovascular hypertension, Normal cardiovascular exam+ Valvular Problems/Murmurs  Rhythm:Regular Rate:Normal  01-01-13 Left ventricle: The cavity size was normal. Systolic    function was normal. The estimated ejection fraction was    in the range of 50% to 55%. Wall motion was normal; there    were no regional wall motion abnormalities.  - Pulmonary arteries: Systolic pressure was mildly    increased. PA peak pressure: 31mm Hg (S).  Impressions:   - No cardiac source of emboli was indentified.  Transthoracic echocardiography.  M-mode, complete 2D,  spectral Doppler, and color Doppler.  Height:  Height:  162.6cm. Height: 64in.  Weight:  Weight: 97.7kg. Weight:  215lb.  Body mass index:  BMI: 37kg/m^2.  Body surface area:     BSA: 2.16m^2.  Blood pressure:     142/67.  Patient  status:  Inpatient.  Location:  Bedside.     Neuro/Psych CVA negative neurological ROS  negative psych ROS   GI/Hepatic negative GI ROS, Neg liver ROS,GERD  ,,  Endo/Other  negative endocrine ROS    Renal/GU negative Renal ROS  negative genitourinary   Musculoskeletal negative musculoskeletal ROS (+) Arthritis ,    Abdominal   Peds negative pediatric ROS (+)  Hematology negative hematology ROS (+) Blood dyscrasia, anemia   Anesthesia Other Findings Medical History  Hypertension  Heart murmur GERD (gastroesophageal reflux disease)  Arthritis Stroke Carotid artery occlusion Hyperlipidemia  History of colon polyps Dizziness  Iron deficiency Atrial  fibrillation    Previous cataract surgery 05-09-23    Reproductive/Obstetrics negative OB ROS                              Anesthesia Physical Anesthesia Plan  ASA: 3  Anesthesia Plan: MAC   Post-op Pain Management:    Induction: Intravenous  PONV Risk Score and Plan:   Airway Management Planned: Natural Airway and Nasal Cannula  Additional Equipment:   Intra-op Plan:   Post-operative Plan:   Informed Consent: I have reviewed the patients History and Physical, chart, labs and discussed the procedure including the risks, benefits and alternatives for the proposed anesthesia with the patient or authorized representative who has indicated his/her understanding and acceptance.     Dental Advisory Given  Plan Discussed with: Anesthesiologist, CRNA and Surgeon  Anesthesia Plan Comments: (Patient consented for risks of anesthesia including but not limited to:  - adverse reactions to medications - damage to eyes, teeth, lips or other oral mucosa - nerve damage due to positioning  - sore throat or hoarseness - Damage to heart, brain, nerves, lungs, other parts of body or loss of life  Patient voiced understanding and assent.)         Anesthesia Quick Evaluation

## 2023-04-29 NOTE — Transfer of Care (Signed)
Immediate Anesthesia Transfer of Care Note  Patient: Ellen Baker  Procedure(s) Performed: CATARACT EXTRACTION PHACO AND INTRAOCULAR LENS PLACEMENT (IOC) LEFT 7.08 00:43.2 (Left)  Patient Location: PACU  Anesthesia Type:MAC  Level of Consciousness: awake, alert , and oriented  Airway & Oxygen Therapy: Patient Spontanous Breathing  Post-op Assessment: Report given to RN and Post -op Vital signs reviewed and stable  Post vital signs: Reviewed and stable  Last Vitals: Pt drinking gingerale.  States mild burning of operative eye Surgeon talking to pt.  Vitals Value Taken Time  BP    Temp    Pulse 71 04/29/23 1127  Resp 13 04/29/23 1127  SpO2 97 % 04/29/23 1127  Vitals shown include unfiled device data.  Last Pain:  Vitals:   04/29/23 0859  TempSrc: Temporal  PainSc: 0-No pain         Complications: No notable events documented.

## 2023-05-01 ENCOUNTER — Encounter: Payer: Self-pay | Admitting: Ophthalmology

## 2023-05-01 ENCOUNTER — Other Ambulatory Visit: Payer: Self-pay | Admitting: Family Medicine

## 2023-05-01 ENCOUNTER — Ambulatory Visit (INDEPENDENT_AMBULATORY_CARE_PROVIDER_SITE_OTHER): Payer: PPO | Admitting: Family Medicine

## 2023-05-01 VITALS — BP 128/82 | Ht 64.0 in | Wt 200.0 lb

## 2023-05-01 DIAGNOSIS — I10 Essential (primary) hypertension: Secondary | ICD-10-CM | POA: Diagnosis not present

## 2023-05-01 DIAGNOSIS — R413 Other amnesia: Secondary | ICD-10-CM

## 2023-05-01 DIAGNOSIS — I5020 Unspecified systolic (congestive) heart failure: Secondary | ICD-10-CM | POA: Diagnosis not present

## 2023-05-01 DIAGNOSIS — R600 Localized edema: Secondary | ICD-10-CM | POA: Diagnosis not present

## 2023-05-01 DIAGNOSIS — R7309 Other abnormal glucose: Secondary | ICD-10-CM

## 2023-05-01 DIAGNOSIS — I4819 Other persistent atrial fibrillation: Secondary | ICD-10-CM

## 2023-05-01 DIAGNOSIS — E785 Hyperlipidemia, unspecified: Secondary | ICD-10-CM

## 2023-05-01 DIAGNOSIS — K219 Gastro-esophageal reflux disease without esophagitis: Secondary | ICD-10-CM

## 2023-05-01 DIAGNOSIS — E559 Vitamin D deficiency, unspecified: Secondary | ICD-10-CM

## 2023-05-01 DIAGNOSIS — Z0000 Encounter for general adult medical examination without abnormal findings: Secondary | ICD-10-CM

## 2023-05-01 DIAGNOSIS — D649 Anemia, unspecified: Secondary | ICD-10-CM

## 2023-05-01 LAB — POCT GLYCOSYLATED HEMOGLOBIN (HGB A1C): Hemoglobin A1C: 5.7 % — AB (ref 4.0–5.6)

## 2023-05-01 MED ORDER — FAMOTIDINE 20 MG PO TABS: ORAL_TABLET | ORAL | 11 refills | Status: DC

## 2023-05-01 MED ORDER — MEMANTINE HCL 5 MG PO TABS: 5.0000 mg | ORAL_TABLET | Freq: Two times a day (BID) | ORAL | 11 refills | Status: DC

## 2023-05-01 MED ORDER — EPLERENONE 25 MG PO TABS: 25.0000 mg | ORAL_TABLET | Freq: Every day | ORAL | 11 refills | Status: AC

## 2023-05-01 NOTE — Progress Notes (Signed)
Subjective:    Patient ID: Ellen Baker, female    DOB: June 24, 1950, 72 y.o.   MRN: [id number]  Ellen Baker is a 72 y.o. female presenting on 05/01/2023 for Medical Management of Chronic Issues   HPI  Discussed the use of AI scribe software for clinical note transcription with the patient, who gave verbal consent to proceed.      Elevated A1c A1c 5.7 today Improving diet   CHF, HFrEF Atrial Fibrillation persistent Followed by [facility] Dr Juliann Pares On Amiodarone and Metoprolol Last ECHOCardiogram 04/16/22 On Eliquis 5mg  TWICE A DAY On Plavix On Eplerenone 25mg  daily, request re order today On BB Metoprolol, Simvastatin   Anemia Last lab shows Hgb 12.5, stable from prior   S/p bilateral cataract surgeries recently.   Health Maintenance:   Colonoscopy 03/10/18, 5 year repeat, Dr Marva Panda retired. Now she will need to be seen for repeat in 02/2023. She will call to schedule at [facility]   Mammogram last done 05/25/22, negative. Repeat yearly, 05/2023.scheduled   UTD Pneumonia vaccine.   Considering Shingrix Vaccine.  Flu Shot at the pharmacy     10/29/2022    3:21 PM 07/31/2022   10:03 AM 07/23/2022   10:09 AM  Depression screen PHQ 2/9  Decreased Interest 0 0 0  Down, Depressed, Hopeless 0 0 0  PHQ - 2 Score 0 0 0  Altered sleeping  0 0  Tired, decreased energy  0 0  Change in appetite  0 0  Feeling bad or failure about yourself   0 0  Trouble concentrating  0 0  Moving slowly or fidgety/restless  0 0  Suicidal thoughts  0 0  PHQ-9 Score  0 0  Difficult doing work/chores  Not difficult at all Not difficult at all       10/29/2022    3:21 PM 07/31/2022   10:03 AM 07/23/2022   10:10 AM 04/09/2022    4:15 PM  GAD 7 : Generalized Anxiety Score  Nervous, Anxious, on Edge 0 0 0 0  Control/stop worrying 0 0 0 0  Worry too much - different things 0 0 0 0  Trouble relaxing 0 0 0 0  Restless 0 0 0 0  Easily annoyed or irritable 0 0 0 0   Afraid - awful might happen 0 0 0 0  Total GAD 7 Score 0 0 0 0  Anxiety Difficulty  Not difficult at all Not difficult at all Not difficult at all    Social History   Tobacco Use   Smoking status: Never   Smokeless tobacco: Never   Tobacco comments:    Smoking cessation materials not required  Vaping Use   Vaping status: Never Used  Substance Use Topics   Alcohol use: Yes    Alcohol/week: 1.0 standard drink of alcohol    Types: 1 Standard drinks or equivalent per week    Comment: Yearly   Drug use: No    Review of Systems Per HPI unless specifically indicated above     Objective:    BP 128/82   Ht 5\' 4"  (1.626 m)   Wt 200 lb (90.7 kg)   BMI 34.33 kg/m   Wt Readings from Last 3 Encounters:  05/01/23 200 lb (90.7 kg)  04/29/23 198 lb (89.8 kg)  04/25/23 199 lb (90.3 kg)    Physical Exam Vitals and nursing note reviewed.  Constitutional:      General: She is not in acute distress.  Appearance: She is well-developed. She is not diaphoretic.     Comments: Well-appearing, comfortable, cooperative  HENT:     Head: Normocephalic and atraumatic.  Eyes:     General:        Right eye: No discharge.        Left eye: No discharge.     Conjunctiva/sclera: Conjunctivae normal.  Neck:     Thyroid: No thyromegaly.  Cardiovascular:     Rate and Rhythm: Normal rate. Rhythm irregular.     Heart sounds: Normal heart sounds. No murmur heard. Pulmonary:     Effort: Pulmonary effort is normal. No respiratory distress.     Breath sounds: Normal breath sounds. No wheezing or rales.  Musculoskeletal:        General: Normal range of motion.     Cervical back: Normal range of motion and neck supple.  Lymphadenopathy:     Cervical: No cervical adenopathy.  Skin:    General: Skin is warm and dry.     Findings: No erythema or rash.  Neurological:     Mental Status: She is alert and oriented to person, place, and time.  Psychiatric:        Behavior: Behavior normal.      Comments: Well groomed, good eye contact, normal speech and thoughts     Results for orders placed or performed in visit on 05/01/23  POCT HgB A1C  Result Value Ref Range   Hemoglobin A1C 5.7 (A) 4.0 - 5.6 %   HbA1c POC (<> result, manual entry)     HbA1c, POC (prediabetic range)     HbA1c, POC (controlled diabetic range)        Assessment & Plan:   Problem List Items Addressed This Visit     Esophageal reflux   Relevant Medications   famotidine (PEPCID) 20 MG tablet   Essential hypertension   Relevant Medications   eplerenone (INSPRA) 25 MG tablet   HFrEF (heart failure with reduced ejection fraction) (HCC) - Primary   Relevant Medications   eplerenone (INSPRA) 25 MG tablet   Other Visit Diagnoses     Abnormal glucose       Relevant Orders   POCT HgB A1C (Completed)   Bilateral lower extremity edema       Memory loss       Relevant Medications   memantine (NAMENDA) 5 MG tablet        HYPERTENSION Controlled Re order Eplerenone per Cardiology recommendations. Continue other medications  Prediabetes HbA1c increased from 5.5 to 5.7 over the past 6 months, indicating early prediabetes. Discussed the importance of maintaining current levels to prevent progression to diabetes. -Maintain current lifestyle and diet, with caution during holiday season.  Vaccinations Last flu shot was in April 2024, and is due for the current flu season. Also eligible for updated pneumonia vaccine (Prevnar 20).  Colon Cancer Screening Due for colonoscopy 02/2023 -Call [facility] to schedule colonoscopy.  Follow-up Next appointment scheduled for physical in May 2025. -Schedule physical for May 2025.         Orders Placed This Encounter  Procedures   POCT HgB A1C    Meds ordered this encounter  Medications   famotidine (PEPCID) 20 MG tablet    Sig: TAKE 1 TABLET(20 MG) BY MOUTH TWICE DAILY    Dispense:  60 tablet    Refill:  11   memantine (NAMENDA) 5 MG tablet     Sig: Take 1 tablet (5 mg total) by mouth 2 (  two) times daily.    Dispense:  60 tablet    Refill:  11   eplerenone (INSPRA) 25 MG tablet    Sig: Take 1 tablet (25 mg total) by mouth daily.    Dispense:  30 tablet    Refill:  11    Follow up plan: Return for 6 month fasting lab > 1 week later Annual Physical (any day, AM preferred).  Future labs ordered for 10/29/23   Saralyn Pilar, DO [facility] 05/01/2023, 4:18 PM

## 2023-05-01 NOTE — Patient Instructions (Addendum)
Thank you for coming to the office today.  Flu Shot at the pharmacy.  Updated Pneumonia vaccine  - Prevnar-20 also at pharmacy if interested to upgrade  Please call [facility] to schedule next Colonoscopy, let me know if you need me to send a referral.  Refill medications today 30 days with refills  DUE for FASTING BLOOD WORK (no food or drink after midnight before the lab appointment, only water or coffee without cream/sugar on the morning of)  SCHEDULE "Lab Only" visit in the morning at the clinic for lab draw in 6 MONTHS   - Make sure Lab Only appointment is at about 1 week before your next appointment, so that results will be available  For Lab Results, once available within 2-3 days of blood draw, you can can log in to MyChart online to view your results and a brief explanation. Also, we can discuss results at next follow-up visit.   Please schedule a Follow-up Appointment to: Return for 6 month fasting lab > 1 week later Annual Physical (any day, AM preferred).  If you have any other questions or concerns, please feel free to call the office or send a message through MyChart. You may also schedule an earlier appointment if necessary.  Additionally, you may be receiving a survey about your experience at our office within a few days to 1 week by e-mail or mail. We value your feedback.  Saralyn Pilar, DO [facility], [place]

## 2023-05-02 ENCOUNTER — Encounter: Payer: Self-pay | Admitting: Urgent Care

## 2023-05-02 MED ORDER — IRON-VITAMIN C 65-125 MG PO TABS: ORAL_TABLET | ORAL | 1 refills | Status: AC

## 2023-05-27 ENCOUNTER — Ambulatory Visit
Admission: RE | Admit: 2023-05-27 | Discharge: 2023-05-27 | Disposition: A | Discharge status: Home / Self Care | Payer: PPO | Source: Ambulatory Visit | Attending: Family Medicine | Admitting: Family Medicine

## 2023-05-27 DIAGNOSIS — Z1231 Encounter for screening mammogram for malignant neoplasm of breast: Secondary | ICD-10-CM | POA: Diagnosis not present

## 2023-06-04 ENCOUNTER — Ambulatory Visit (INDEPENDENT_AMBULATORY_CARE_PROVIDER_SITE_OTHER): Payer: PPO | Admitting: Vascular Surgery

## 2023-06-04 ENCOUNTER — Encounter (INDEPENDENT_AMBULATORY_CARE_PROVIDER_SITE_OTHER): Payer: Self-pay | Admitting: Vascular Surgery

## 2023-06-04 ENCOUNTER — Ambulatory Visit (INDEPENDENT_AMBULATORY_CARE_PROVIDER_SITE_OTHER): Payer: PPO

## 2023-06-04 ENCOUNTER — Other Ambulatory Visit (INDEPENDENT_AMBULATORY_CARE_PROVIDER_SITE_OTHER): Payer: Self-pay | Admitting: Vascular Surgery

## 2023-06-04 ENCOUNTER — Telehealth: Payer: Self-pay | Admitting: Family Medicine

## 2023-06-04 VITALS — BP 154/98 | HR 66 | Resp 18 | Ht 64.0 in | Wt 200.0 lb

## 2023-06-04 DIAGNOSIS — I10 Essential (primary) hypertension: Secondary | ICD-10-CM | POA: Diagnosis not present

## 2023-06-04 DIAGNOSIS — I6521 Occlusion and stenosis of right carotid artery: Secondary | ICD-10-CM | POA: Diagnosis not present

## 2023-06-04 DIAGNOSIS — I6523 Occlusion and stenosis of bilateral carotid arteries: Secondary | ICD-10-CM | POA: Diagnosis not present

## 2023-06-04 DIAGNOSIS — E785 Hyperlipidemia, unspecified: Secondary | ICD-10-CM | POA: Diagnosis not present

## 2023-06-04 DIAGNOSIS — I4819 Other persistent atrial fibrillation: Secondary | ICD-10-CM

## 2023-06-04 NOTE — Telephone Encounter (Signed)
Copied from CRM [id number]. Topic: Medicare AWV >> Jun 04, 2023  2:52 PM Payton Doughty wrote: Reason for CRM: Called LVM 06/04/2023 to schedule Annual Wellness Visit. Please schedule office or virtual visits.  Ellen Baker; Care Guide Ambulatory Clinical Support [place] l [facility] Direct Dial: [phone]

## 2023-06-04 NOTE — Assessment & Plan Note (Signed)
Carotid duplex today reveals a patent right carotid endarterectomy without significant recurrent stenosis and 1 to 39% left ICA stenosis.  Continue current medical regimen including Zocor, Plavix, and Eliquis.  Follow-up in 1 year.

## 2023-06-04 NOTE — Assessment & Plan Note (Signed)
Rate controlled and on anticoagulation. 

## 2023-06-04 NOTE — Progress Notes (Signed)
MRN : [id number]  Ellen Baker is a 72 y.o. (26-Feb-1951) female who presents with chief complaint of  Chief Complaint  Patient presents with   Follow-up    Follow up   .  History of Present Illness: Patient returns today in follow up of her carotid disease.  She is doing well.  She had a right carotid endarterectomy in the past.  She has no focal neurologic symptoms at this time. Specifically, the patient denies amaurosis fugax, speech or swallowing difficulties, or arm or leg weakness or numbness.  Carotid duplex today reveals a patent right carotid endarterectomy without significant recurrent stenosis and 1 to 39% left ICA stenosis.  Current Outpatient Medications  Medication Sig Dispense Refill   alendronate (FOSAMAX) 70 MG tablet TAKE 1 TABLET(70 MG) BY MOUTH 1 TIME A WEEK WITH A FULL GLASS OF WATER AND ON AN EMPTY STOMACH 12 tablet 1   amiodarone (PACERONE) 200 MG tablet Take by mouth.     apixaban (ELIQUIS) 5 MG TABS tablet Take 5 mg by mouth 2 (two) times daily.     Calcium Carbonate-Vitamin D3 600-400 MG-UNIT TABS Take 2 tablets by mouth daily.     clopidogrel (PLAVIX) 75 MG tablet TAKE 1 TABLET BY MOUTH DAILY 90 tablet 1   eplerenone (INSPRA) 25 MG tablet Take 1 tablet (25 mg total) by mouth daily. 30 tablet 11   famotidine (PEPCID) 20 MG tablet TAKE 1 TABLET(20 MG) BY MOUTH TWICE DAILY 60 tablet 11   furosemide (LASIX) 20 MG tablet Take 1 tablet (20 mg total) by mouth daily. 30 tablet 0   ibuprofen (ADVIL) 200 MG tablet Take by mouth.     Iron-Vitamin C 65-125 MG TABS Take 1 tablet 3 days a week and 2 tablets 4 days a week for iron deficiency. 180 tablet 1   loratadine (CLARITIN) 10 MG tablet Take 1 tablet (10 mg total) by mouth continuous as needed for allergies. 30 tablet 6   losartan (COZAAR) 25 MG tablet Take 1 tablet (25 mg total) by mouth daily. 90 tablet 3   meclizine (ANTIVERT) 25 MG tablet Take 1 tablet (25 mg total) by mouth 3 (three) times daily as needed for  dizziness. 30 tablet 0   memantine (NAMENDA) 5 MG tablet Take 1 tablet (5 mg total) by mouth 2 (two) times daily. 60 tablet 11   metoprolol tartrate (LOPRESSOR) 50 MG tablet Take 1 tablet (50 mg total) by mouth 2 (two) times daily. 180 tablet 3   Multiple Vitamins-Minerals (CENTRUM SILVER ULTRA WOMENS) TABS Take 1 tablet by mouth daily.     mupirocin ointment (BACTROBAN) 2 % Apply 1 Application topically 2 (two) times daily. For 1-2 weeks on topical for skin infection 22 g 0   simvastatin (ZOCOR) 20 MG tablet TAKE 1 TABLET(20 MG) BY MOUTH DAILY 90 tablet 3   traZODone (DESYREL) 50 MG tablet Take 0.5 tablets (25 mg total) by mouth at bedtime. 45 tablet 3   vitamin B-12 (CYANOCOBALAMIN) 1000 MCG tablet Take 1,000 mcg by mouth once a week.     No current facility-administered medications for this visit.    Past Medical History:  Diagnosis Date   Arthritis    right knee   Atrial fibrillation (HCC)    Carotid artery occlusion    Dizziness    GERD (gastroesophageal reflux disease)    Heart murmur    History of colon polyps    Hyperlipidemia    Hypertension    Iron deficiency  Stroke (HCC) 12/31/2012   no residual effects    Past Surgical History:  Procedure Laterality Date   CAROTID ENDARTERECTOMY     RIGHT  01/19/13   CATARACT EXTRACTION W/PHACO Right 04/08/2023   Procedure: CATARACT EXTRACTION PHACO AND INTRAOCULAR LENS PLACEMENT (IOC) RIGHT 7.70 00:52.9;  Surgeon: Nevada Crane, MD;  Location: [facility];  Service: Ophthalmology;  Laterality: Right;   CATARACT EXTRACTION W/PHACO Left 04/29/2023   Procedure: CATARACT EXTRACTION PHACO AND INTRAOCULAR LENS PLACEMENT (IOC) LEFT 7.08 00:43.2;  Surgeon: Nevada Crane, MD;  Location: [facility];  Service: Ophthalmology;  Laterality: Left;   COLONOSCOPY  08/18/2012   COLONOSCOPY WITH PROPOFOL N/A 03/10/2018   Procedure: COLONOSCOPY WITH PROPOFOL;  Surgeon: Christena Deem, MD;  Location: [facility];   Service: Endoscopy;  Laterality: N/A;   DILATION AND CURETTAGE OF UTERUS     ENDARTERECTOMY Right 01/19/2013   Procedure: ENDARTERECTOMY CAROTID with patch angioplasty;  Surgeon: Fransisco Hertz, MD;  Location: [facility] OR;  Service: Vascular;  Laterality: Right;   ESOPHAGEAL DILATION     JOINT REPLACEMENT Left 04/15/2017   Knee   taotal knee arthorplasty Right 2021   TOTAL KNEE ARTHROPLASTY Right 12/01/2019   Procedure: TOTAL KNEE ARTHROPLASTY - RNFA;  Surgeon: Christena Flake, MD;  Location: [facility] ORS;  Service: Orthopedics;  Laterality: Right;   TUBAL LIGATION     UPPER GI ENDOSCOPY  08/18/2012     Social History   Tobacco Use   Smoking status: Never   Smokeless tobacco: Never   Tobacco comments:    Smoking cessation materials not required  Vaping Use   Vaping status: Never Used  Substance Use Topics   Alcohol use: Yes    Alcohol/week: 1.0 standard drink of alcohol    Types: 1 Standard drinks or equivalent per week    Comment: Yearly   Drug use: No       Family History  Problem Relation Age of Onset   Stroke Mother    Deep vein thrombosis Mother    Heart disease Mother        Amputation-   Hypertension Mother    Alzheimer's disease Mother    Heart failure Father    Heart disease Father        CHF   Hypertension Father    Varicose Veins Father    Parkinson's disease Father    Breast cancer Paternal Aunt 54     Allergies  Allergen Reactions   Adhesive [Tape] Rash    Skin became RAW     REVIEW OF SYSTEMS (Negative unless checked)   Constitutional: [] Weight loss  [] Fever  [] Chills Cardiac: [] Chest pain   [] Chest pressure   [] Palpitations   [] Shortness of breath when laying flat   [] Shortness of breath at rest   [] Shortness of breath with exertion. Vascular:  [] Pain in legs with walking   [] Pain in legs at rest   [] Pain in legs when laying flat   [] Claudication   [] Pain in feet when walking  [] Pain in feet at rest  [] Pain in feet when laying flat   [] History of DVT    [] Phlebitis   [] Swelling in legs   [] Varicose veins   [] Non-healing ulcers Pulmonary:   [] Uses home oxygen   [] Productive cough   [] Hemoptysis   [] Wheeze  [] COPD   [] Asthma Neurologic:  [x] Dizziness  [] Blackouts   [] Seizures   [x] History of stroke   [] History of TIA  [] Aphasia   [] Temporary blindness   []   Dysphagia   [] Weakness or numbness in arms   [] Weakness or numbness in legs Musculoskeletal:  [x] Arthritis   [] Joint swelling   [] Joint pain   [] Low back pain Hematologic:  [] Easy bruising  [] Easy bleeding   [] Hypercoagulable state   [x] Anemic  [] Hepatitis Gastrointestinal:  [] Blood in stool   [] Vomiting blood  [x] Gastroesophageal reflux/heartburn   [] Abdominal pain Genitourinary:  [] Chronic kidney disease   [] Difficult urination  [] Frequent urination  [] Burning with urination   [] Hematuria Skin:  [] Rashes   [] Ulcers   [] Wounds Psychological:  [] History of anxiety   []  History of major depression.   Physical Examination  BP (!) 154/98   Pulse 66   Resp 18   Ht 5\' 4"  (1.626 m)   Wt 200 lb (90.7 kg)   BMI 34.33 kg/m  Gen:  WD/WN, NAD Head: [place]/AT, No temporalis wasting. Ear/Nose/Throat: Hearing grossly intact, nares w/o erythema or drainage Eyes: Conjunctiva clear. Sclera non-icteric Neck: Supple.  Trachea midline Pulmonary:  Good air movement, no use of accessory muscles.  Cardiac: RRR, no JVD Vascular:  Vessel Right Left  Radial Palpable Palpable               Musculoskeletal: M/S 5/5 throughout.  No deformity or atrophy. No edema. Neurologic: Sensation grossly intact in extremities.  Symmetrical.  Speech is fluent.  Psychiatric: Judgment intact, Mood & affect appropriate for pt's clinical situation. Dermatologic: No rashes or ulcers noted.  No cellulitis or open wounds.      Labs Recent Results (from the past 2160 hours)  Iron and TIBC     Status: None   Collection Time: 04/25/23  2:03 PM  Result Value Ref Range   Iron 56 28 - 170 ug/dL   TIBC 829 562 - 130 ug/dL    Saturation Ratios 13 10.4 - 31.8 %   UIBC 370 ug/dL    Comment: Performed at [facility], [address] [place], [place] [address]  Ferritin     Status: None   Collection Time: 04/25/23  2:03 PM  Result Value Ref Range   Ferritin 40 11 - 307 ng/mL    Comment: Performed at [facility], [address] [place], [place] [address]  Comprehensive metabolic panel     Status: Abnormal   Collection Time: 04/25/23  2:03 PM  Result Value Ref Range   Sodium 138 135 - 145 mmol/L   Potassium 3.9 3.5 - 5.1 mmol/L   Chloride 104 98 - 111 mmol/L   CO2 27 22 - 32 mmol/L   Glucose, Bld 126 (H) 70 - 99 mg/dL    Comment: Glucose reference range applies only to samples taken after fasting for at least 8 hours.   BUN 15 8 - 23 mg/dL   Creatinine, Ser 9.52 0.44 - 1.00 mg/dL   Calcium 8.6 (L) 8.9 - 10.3 mg/dL   Total Protein 6.8 6.5 - 8.1 g/dL   Albumin 3.4 (L) 3.5 - 5.0 g/dL   AST 22 15 - 41 U/L   ALT 18 0 - 44 U/L   Alkaline Phosphatase 63 38 - 126 U/L   Total Bilirubin 0.5 <1.2 mg/dL   GFR, Estimated >84 >13 mL/min    Comment: (NOTE) Calculated using the CKD-EPI Creatinine Equation (2021)    Anion gap 7 5 - 15    Comment: Performed at [facility], [address] [place], [place] [address]  CBC with Differential/Platelet     Status: Abnormal   Collection Time: 04/25/23  2:03 PM  Result Value Ref Range   WBC 3.9 (L) 4.0 - 10.5 K/uL   RBC 4.09 3.87 - 5.11 MIL/uL   Hemoglobin 12.5 12.0 - 15.0 g/dL   HCT 28.4 13.2 - 44.0 %   MCV 92.7 80.0 - 100.0 fL   MCH 30.6 26.0 - 34.0 pg   MCHC 33.0 30.0 - 36.0 g/dL   RDW 10.2 72.5 - 36.6 %   Platelets 211 150 - 400 K/uL   nRBC 0.0 0.0 - 0.2 %   Neutrophils Relative % 54 %   Neutro Abs 2.1 1.7 - 7.7 K/uL   Lymphocytes Relative 26 %   Lymphs Abs 1.0 0.7 - 4.0 K/uL   Monocytes Relative 14 %   Monocytes Absolute 0.5 0.1 - 1.0 K/uL   Eosinophils Relative 5 %   Eosinophils Absolute 0.2 0.0 - 0.5 K/uL   Basophils Relative 1 %    Basophils Absolute 0.0 0.0 - 0.1 K/uL   Immature Granulocytes 0 %   Abs Immature Granulocytes 0.01 0.00 - 0.07 K/uL    Comment: Performed at [facility], [address] [place], [place] [address]  POCT HgB A1C     Status: Abnormal   Collection Time: 05/01/23  4:04 PM  Result Value Ref Range   Hemoglobin A1C 5.7 (A) 4.0 - 5.6 %   HbA1c POC (<> result, manual entry)     HbA1c, POC (prediabetic range)     HbA1c, POC (controlled diabetic range)      Radiology MM 3D SCREENING MAMMOGRAM BILATERAL BREAST Result Date: 05/28/2023 CLINICAL DATA:  Screening. EXAM: DIGITAL SCREENING BILATERAL MAMMOGRAM WITH TOMOSYNTHESIS AND CAD TECHNIQUE: Bilateral screening digital craniocaudal and mediolateral oblique mammograms were obtained. Bilateral screening digital breast tomosynthesis was performed. The images were evaluated with computer-aided detection. COMPARISON:  Previous exam(s). ACR Breast Density Category a: The breasts are almost entirely fatty. FINDINGS: There are no findings suspicious for malignancy. IMPRESSION: No mammographic evidence of malignancy. A result letter of this screening mammogram will be mailed directly to the patient. RECOMMENDATION: Screening mammogram in one year. (Code:[id number]-B-01Y) BI-RADS CATEGORY  1: Negative. Electronically Signed   By: Frederico Hamman M.D.   On: 05/28/2023 17:30    Assessment/Plan  Persistent atrial fibrillation (HCC) Rate controlled and on anticoagulation.  Carotid artery stenosis, symptomatic Carotid duplex today reveals a patent right carotid endarterectomy without significant recurrent stenosis and 1 to 39% left ICA stenosis.  Continue current medical regimen including Zocor, Plavix, and Eliquis.  Follow-up in 1 year.  Essential hypertension blood pressure control important in reducing the progression of atherosclerotic disease. On appropriate oral medications.     Dyslipidemia lipid control important in reducing the progression of  atherosclerotic disease. Continue statin therapy  Festus Barren, MD  06/04/2023 10:44 AM    This note was created with Dragon medical transcription system.  Any errors from dictation are purely unintentional

## 2023-07-19 ENCOUNTER — Ambulatory Visit (INDEPENDENT_AMBULATORY_CARE_PROVIDER_SITE_OTHER): Payer: PPO

## 2023-07-19 DIAGNOSIS — Z780 Asymptomatic menopausal state: Secondary | ICD-10-CM | POA: Diagnosis not present

## 2023-07-19 DIAGNOSIS — Z0000 Encounter for general adult medical examination without abnormal findings: Secondary | ICD-10-CM

## 2023-07-19 NOTE — Patient Instructions (Addendum)
Ellen Baker , Thank you for taking time to come for your Medicare Wellness Visit. I appreciate your ongoing commitment to your health goals. Please review the following plan we discussed and let me know if I can assist you in the future.   Referrals/Orders/Follow-Ups/Clinician Recommendations: REFERRAL SENT FOR BONE DENSITY SCAN  You have an order for:  []   2D Mammogram  []   3D Mammogram  [x]   Bone Density     Please call for appointment:  [facility]  [address] [place] [address] [phone] [facility] [address] [place], [place] [address] [phone] [facility] [address]. [place], [place] [address] [phone]   Make sure to wear two-piece clothing.  No lotions, powders, or deodorants the day of the appointment. Make sure to bring picture ID and insurance card.  Bring list of medications you are currently taking including any supplements.   Schedule your [place] screening mammogram through MyChart!   Log into your MyChart account.  Go to 'Visit' (or 'Appointments' if on mobile App) --> Schedule an Appointment  Under 'Select a Reason for Visit' choose the Mammogram Screening option.  Complete the pre-visit questions and select the time and place that best fits your schedule.   This is a list of the screening recommended for you and due dates:  Health Maintenance  Topic Date Due   Hepatitis C Screening  Never done   Flu Shot  01/17/2023   COVID-19 Vaccine (4 - 2024-25 season) 02/17/2023   Colon Cancer Screening  03/11/2023   Zoster (Shingles) Vaccine (1 of 2) 08/01/2023*   Medicare Annual Wellness Visit  07/18/2024   Mammogram  05/26/2025   DTaP/Tdap/Td vaccine (2 - Td or Tdap) 07/19/2030   Pneumonia Vaccine  Completed   DEXA scan (bone density measurement)  Completed   HPV Vaccine  Aged Out  *Topic was postponed. The  date shown is not the original due date.    Advanced directives: (ACP Link)Information on Advanced Care Planning can be found at [facility] [place] Advance Health Care Directives Advance Health Care Directives ([email or web address]   Next Medicare Annual Wellness Visit scheduled for next year: Yes   07/24/24 @ 1:20 PM BY PHONE

## 2023-07-19 NOTE — Progress Notes (Signed)
Subjective:   Ellen Baker is a 73 y.o. female who presents for Medicare Annual (Subsequent) preventive examination.  Visit Complete: Virtual I connected with  Ellen Baker on 07/19/23 by a audio enabled telemedicine application and verified that I am speaking with the correct person using two identifiers.  This patient declined Interactive audio and Acupuncturist. Therefore the visit was completed with audio only.   Patient Location: Home  Provider Location: Office/Clinic  I discussed the limitations of evaluation and management by telemedicine. The patient expressed understanding and agreed to proceed.  Vital Signs: Because this visit was a virtual/telehealth visit, some criteria may be missing or patient reported. Any vitals not documented were not able to be obtained and vitals that have been documented are patient reported.  Cardiac Risk Factors include: advanced age (>37men, >64 women);dyslipidemia;hypertension;sedentary lifestyle;obesity (BMI >30kg/m2)     Objective:    There were no vitals filed for this visit. There is no height or weight on file to calculate BMI.     07/19/2023    2:12 PM 04/29/2023    8:56 AM 04/25/2023    2:18 PM 04/08/2023    7:45 AM 01/23/2023    1:03 PM 08/18/2022   11:21 AM 07/25/2022    2:56 PM  Advanced Directives  Does Patient Have a Medical Advance Directive? No No No No No No No  Would patient like information on creating a medical advance directive? No - Patient declined No - Patient declined  No - Patient declined   No - Patient declined    Current Medications (verified) Outpatient Encounter Medications as of 07/19/2023  Medication Sig   alendronate (FOSAMAX) 70 MG tablet TAKE 1 TABLET(70 MG) BY MOUTH 1 TIME A WEEK WITH A FULL GLASS OF WATER AND ON AN EMPTY STOMACH   Calcium Carbonate-Vitamin D3 600-400 MG-UNIT TABS Take 2 tablets by mouth daily.   clopidogrel (PLAVIX) 75 MG tablet TAKE 1 TABLET BY MOUTH DAILY    eplerenone (INSPRA) 25 MG tablet Take 1 tablet (25 mg total) by mouth daily.   famotidine (PEPCID) 20 MG tablet TAKE 1 TABLET(20 MG) BY MOUTH TWICE DAILY   ibuprofen (ADVIL) 200 MG tablet Take by mouth.   Iron-Vitamin C 65-125 MG TABS Take 1 tablet 3 days a week and 2 tablets 4 days a week for iron deficiency.   loratadine (CLARITIN) 10 MG tablet Take 1 tablet (10 mg total) by mouth continuous as needed for allergies.   losartan (COZAAR) 25 MG tablet Take 1 tablet (25 mg total) by mouth daily.   meclizine (ANTIVERT) 25 MG tablet Take 1 tablet (25 mg total) by mouth 3 (three) times daily as needed for dizziness.   memantine (NAMENDA) 5 MG tablet Take 1 tablet (5 mg total) by mouth 2 (two) times daily.   metoprolol tartrate (LOPRESSOR) 50 MG tablet Take 1 tablet (50 mg total) by mouth 2 (two) times daily.   Multiple Vitamins-Minerals (CENTRUM SILVER ULTRA WOMENS) TABS Take 1 tablet by mouth daily.   mupirocin ointment (BACTROBAN) 2 % Apply 1 Application topically 2 (two) times daily. For 1-2 weeks on topical for skin infection   simvastatin (ZOCOR) 20 MG tablet TAKE 1 TABLET(20 MG) BY MOUTH DAILY   traZODone (DESYREL) 50 MG tablet Take 0.5 tablets (25 mg total) by mouth at bedtime.   vitamin B-12 (CYANOCOBALAMIN) 1000 MCG tablet Take 1,000 mcg by mouth once a week.   amiodarone (PACERONE) 200 MG tablet Take by mouth.   apixaban (ELIQUIS)  5 MG TABS tablet Take 5 mg by mouth 2 (two) times daily. (Patient not taking: Reported on 07/19/2023)   furosemide (LASIX) 20 MG tablet Take 1 tablet (20 mg total) by mouth daily.   No facility-administered encounter medications on file as of 07/19/2023.    Allergies (verified) Adhesive [tape]   History: Past Medical History:  Diagnosis Date   Arthritis    right knee   Atrial fibrillation (HCC)    Carotid artery occlusion    Dizziness    GERD (gastroesophageal reflux disease)    Heart murmur    History of colon polyps    Hyperlipidemia    Hypertension     Iron deficiency    Stroke (HCC) 12/31/2012   no residual effects   Past Surgical History:  Procedure Laterality Date   CAROTID ENDARTERECTOMY     RIGHT  01/19/13   CATARACT EXTRACTION W/PHACO Right 04/08/2023   Procedure: CATARACT EXTRACTION PHACO AND INTRAOCULAR LENS PLACEMENT (IOC) RIGHT 7.70 00:52.9;  Surgeon: Nevada Crane, MD;  Location: [facility];  Service: Ophthalmology;  Laterality: Right;   CATARACT EXTRACTION W/PHACO Left 04/29/2023   Procedure: CATARACT EXTRACTION PHACO AND INTRAOCULAR LENS PLACEMENT (IOC) LEFT 7.08 00:43.2;  Surgeon: Nevada Crane, MD;  Location: [facility];  Service: Ophthalmology;  Laterality: Left;   COLONOSCOPY  08/18/2012   COLONOSCOPY WITH PROPOFOL N/A 03/10/2018   Procedure: COLONOSCOPY WITH PROPOFOL;  Surgeon: Christena Deem, MD;  Location: [facility];  Service: Endoscopy;  Laterality: N/A;   DILATION AND CURETTAGE OF UTERUS     ENDARTERECTOMY Right 01/19/2013   Procedure: ENDARTERECTOMY CAROTID with patch angioplasty;  Surgeon: Fransisco Hertz, MD;  Location: [facility] OR;  Service: Vascular;  Laterality: Right;   ESOPHAGEAL DILATION     JOINT REPLACEMENT Left 04/15/2017   Knee   taotal knee arthorplasty Right 2021   TOTAL KNEE ARTHROPLASTY Right 12/01/2019   Procedure: TOTAL KNEE ARTHROPLASTY - RNFA;  Surgeon: Christena Flake, MD;  Location: [facility] ORS;  Service: Orthopedics;  Laterality: Right;   TUBAL LIGATION     UPPER GI ENDOSCOPY  08/18/2012   Family History  Problem Relation Age of Onset   Stroke Mother    Deep vein thrombosis Mother    Heart disease Mother        Amputation-   Hypertension Mother    Alzheimer's disease Mother    Heart failure Father    Heart disease Father        CHF   Hypertension Father    Varicose Veins Father    Parkinson's disease Father    Breast cancer Paternal Aunt 62   Social History   Socioeconomic History   Marital status: Widowed    Spouse name: Not on file   Number of  children: 3   Years of education: Not on file   Highest education level: 12th grade  Occupational History   Occupation: retired  Tobacco Use   Smoking status: Never   Smokeless tobacco: Never   Tobacco comments:    Smoking cessation materials not required  Vaping Use   Vaping status: Never Used  Substance and Sexual Activity   Alcohol use: Yes    Alcohol/week: 1.0 standard drink of alcohol    Types: 1 Standard drinks or equivalent per week    Comment: Yearly   Drug use: No   Sexual activity: Never  Other Topics Concern   Not on file  Social History Narrative   Pt lives alone.  Social Drivers of Corporate investment banker Strain: Low Risk  (07/19/2023)   Overall Financial Resource Strain (CARDIA)    Difficulty of Paying Living Expenses: Not hard at all  Food Insecurity: No Food Insecurity (07/19/2023)   Hunger Vital Sign    Worried About Running Out of Food in the Last Year: Never true    Ran Out of Food in the Last Year: Never true  Transportation Needs: No Transportation Needs (07/19/2023)   PRAPARE - Administrator, Civil Service (Medical): No    Lack of Transportation (Non-Medical): No  Physical Activity: Insufficiently Active (07/19/2023)   Exercise Vital Sign    Days of Exercise per Week: 3 days    Minutes of Exercise per Session: 30 min  Stress: No Stress Concern Present (07/19/2023)   [employer] of Occupational Health - Occupational Stress Questionnaire    Feeling of Stress : Not at all  Social Connections: Moderately Isolated (07/19/2023)   Social Connection and Isolation Panel [NHANES]    Frequency of Communication with Friends and Family: More than three times a week    Frequency of Social Gatherings with Friends and Family: Twice a week    Attends Religious Services: More than 4 times per year    Active Member of [employer] or Organizations: No    Attends Banker Meetings: Never    Marital Status: Widowed    Tobacco  Counseling Counseling given: Not Answered Tobacco comments: Smoking cessation materials not required   Clinical Intake:  Pre-visit preparation completed: Yes  Pain : No/denies pain     BMI - recorded: 34.3 Nutritional Status: BMI > 30  Obese Nutritional Risks: None Diabetes: No  How often do you need to have someone help you when you read instructions, pamphlets, or other written materials from your doctor or pharmacy?: 1 - Never  Interpreter Needed?: No  Information entered by :: Kennedy Bucker, LPN   Activities of Daily Living    07/19/2023    2:15 PM 04/29/2023    8:52 AM  In your present state of health, do you have any difficulty performing the following activities:  Hearing? 0 0  Vision? 0 0  Difficulty concentrating or making decisions? 0 0  Walking or climbing stairs? 0   Dressing or bathing? 0   Doing errands, shopping? 0   Preparing Food and eating ? N   Using the Toilet? N   In the past six months, have you accidently leaked urine? N   Do you have problems with loss of bowel control? N   Managing your Medications? N   Managing your Finances? N   Housekeeping or managing your Housekeeping? N     Patient Care Team: Smitty Cords, DO as PCP - General (Family Medicine) Dedra Skeens, PA-C (Orthopedic Surgery) Duanne Limerick, MD (Family Medicine) Earna Coder, MD as Consulting Physician (Oncology) Nevada Crane, MD as Consulting Physician (Ophthalmology)  Indicate any recent Medical Services you may have received from other than [facility] providers in the past year (date may be approximate).     Assessment:   This is a routine wellness examination for Keziah.  Hearing/Vision screen Hearing Screening - Comments:: NO AIDS Vision Screening - Comments:: READERS, HAD CATARACT SGY- DR.KING   Goals Addressed             This Visit's Progress    DIET - EAT MORE FRUITS AND VEGETABLES        Depression Screen  07/19/2023     2:10 PM 10/29/2022    3:21 PM 07/31/2022   10:03 AM 07/23/2022   10:09 AM 04/09/2022    4:15 PM 01/18/2022   10:13 AM 07/21/2021    9:10 AM  PHQ 2/9 Scores  PHQ - 2 Score 0 0 0 0 0 0 0  PHQ- 9 Score 0  0 0 0 4 0    Fall Risk    07/19/2023    2:14 PM 10/29/2022    3:21 PM 07/31/2022   10:02 AM 07/23/2022   10:09 AM 06/06/2022   11:37 AM  Fall Risk   Falls in the past year? 1 1 1 1  0  Comment    "slid out the bed, but didn't fall"   Number falls in past yr: 0 1 1 1  0  Injury with Fall? 1 1 0 0 0  Risk for fall due to : History of fall(s)  History of fall(s) No Fall Risks No Fall Risks  Follow up Falls prevention discussed;Falls evaluation completed  Falls evaluation completed Falls evaluation completed;Falls prevention discussed Falls evaluation completed    MEDICARE RISK AT HOME: Medicare Risk at Home Any stairs in or around the home?: Yes If so, are there any without handrails?: Yes Home free of loose throw rugs in walkways, pet beds, electrical cords, etc?: Yes Adequate lighting in your home to reduce risk of falls?: Yes Life alert?: No Use of a cane, walker or w/c?: Yes (CANE MOST OF THE TIME; HAS A WALKER) Grab bars in the bathroom?: No Shower chair or bench in shower?: Yes Elevated toilet seat or a handicapped toilet?: Yes  TIMED UP AND GO:  Was the test performed?  No    Cognitive Function:        07/19/2023    2:19 PM 06/06/2022   11:40 AM 12/09/2017    8:31 AM  6CIT Screen  What Year? 0 points 0 points 0 points  What month? 0 points 0 points 0 points  What time? 0 points 0 points 0 points  Count back from 20 0 points 0 points 0 points  Months in reverse 0 points 0 points 0 points  Repeat phrase 2 points 2 points 0 points  Total Score 2 points 2 points 0 points    Immunizations Immunization History  Administered Date(s) Administered   Fluad Quad(high Dose 65+) 05/05/2019, 04/11/2020, 09/14/2022   Influenza, High Dose Seasonal PF 03/31/2018, 06/02/2021    Moderna SARS-COV2 Booster Vaccination 04/27/2020   PFIZER(Purple Top)SARS-COV-2 Vaccination 08/11/2019, 09/01/2019, 04/27/2020   Pneumococcal Conjugate-13 06/05/2016   Pneumococcal Polysaccharide-23 12/05/2017   Tdap 07/19/2020    TDAP status: Up to date  Flu Vaccine status: Up to date  Pneumococcal vaccine status: Up to date  Covid-19 vaccine status: Completed vaccines  Qualifies for Shingles Vaccine? Yes   Zostavax completed No   Shingrix Completed?: No.    Education has been provided regarding the importance of this vaccine. Patient has been advised to call insurance company to determine out of pocket expense if they have not yet received this vaccine. Advised may also receive vaccine at local pharmacy or Health Dept. Verbalized acceptance and understanding.  Screening Tests Health Maintenance  Topic Date Due   Hepatitis C Screening  Never done   INFLUENZA VACCINE  01/17/2023   COVID-19 Vaccine (4 - 2024-25 season) 02/17/2023   Colonoscopy  03/11/2023   Zoster Vaccines- Shingrix (1 of 2) 08/01/2023 (Originally 02/24/1970)   Medicare Annual Wellness (AWV)  07/18/2024  MAMMOGRAM  05/26/2025   DTaP/Tdap/Td (2 - Td or Tdap) 07/19/2030   Pneumonia Vaccine 74+ Years old  Completed   DEXA SCAN  Completed   HPV VACCINES  Aged Out    Health Maintenance  Health Maintenance Due  Topic Date Due   Hepatitis C Screening  Never done   INFLUENZA VACCINE  01/17/2023   COVID-19 Vaccine (4 - 2024-25 season) 02/17/2023   Colonoscopy  03/11/2023    Colorectal cancer screening: Type of screening: Colonoscopy. Completed 03/10/18. Repeat every 5 years- HAS APPOINTMENT W/ [facility] IN [facility], 2025  Mammogram status: Completed 05/27/23. Repeat every year  Bone Density status: Completed 06/20/20. Results reflect: Bone density results: OSTEOPOROSIS. Repeat every 2 years.  Lung Cancer Screening: (Low Dose CT Chest recommended if Age 4-80 years, 20 pack-year currently smoking OR have quit w/in  15years.) does not qualify.   Additional Screening:  Hepatitis C Screening: does qualify; Completed NO  Vision Screening: Recommended annual ophthalmology exams for early detection of glaucoma and other disorders of the eye. Is the patient up to date with their annual eye exam?  Yes  Who is the provider or what is the name of the office in which the patient attends annual eye exams? DR.Brooke Dare If pt is not established with a provider, would they like to be referred to a provider to establish care? No .   Dental Screening: Recommended annual dental exams for proper oral hygiene    Community Resource Referral / Chronic Care Management: CRR required this visit?  No   CCM required this visit?  No     Plan:     I have personally reviewed and noted the following in the patient's chart:   Medical and social history Use of alcohol, tobacco or illicit drugs  Current medications and supplements including opioid prescriptions. Patient is not currently taking opioid prescriptions. Functional ability and status Nutritional status Physical activity Advanced directives List of other physicians Hospitalizations, surgeries, and ER visits in previous 12 months Vitals Screenings to include cognitive, depression, and falls Referrals and appointments  In addition, I have reviewed and discussed with patient certain preventive protocols, quality metrics, and best practice recommendations. A written personalized care plan for preventive services as well as general preventive health recommendations were provided to patient.     Hal Hope, LPN   09/24/8117   After Visit Summary: (MyChart) Due to this being a telephonic visit, the after visit summary with patients personalized plan was offered to patient via MyChart   Nurse Notes: REFERRAL FOR BDS SENT

## 2023-08-28 DIAGNOSIS — K5790 Diverticulosis of intestine, part unspecified, without perforation or abscess without bleeding: Secondary | ICD-10-CM | POA: Diagnosis not present

## 2023-08-28 DIAGNOSIS — Z7901 Long term (current) use of anticoagulants: Secondary | ICD-10-CM | POA: Diagnosis not present

## 2023-08-28 DIAGNOSIS — Z7902 Long term (current) use of antithrombotics/antiplatelets: Secondary | ICD-10-CM | POA: Diagnosis not present

## 2023-08-28 DIAGNOSIS — K219 Gastro-esophageal reflux disease without esophagitis: Secondary | ICD-10-CM | POA: Diagnosis not present

## 2023-08-28 DIAGNOSIS — Z860101 Personal history of adenomatous and serrated colon polyps: Secondary | ICD-10-CM | POA: Diagnosis not present

## 2023-09-07 ENCOUNTER — Other Ambulatory Visit: Payer: Self-pay | Admitting: Family Medicine

## 2023-09-07 DIAGNOSIS — E785 Hyperlipidemia, unspecified: Secondary | ICD-10-CM

## 2023-09-09 NOTE — Telephone Encounter (Signed)
 Requested Prescriptions  Pending Prescriptions Disp Refills   simvastatin (ZOCOR) 20 MG tablet [Pharmacy Med Name: SIMVASTATIN 20MG  TABLETS] 90 tablet 0    Sig: TAKE 1 TABLET(20 MG) BY MOUTH DAILY     Cardiovascular:  Antilipid - Statins Failed - 09/09/2023  1:22 PM      Failed - Lipid Panel in normal range within the last 12 months    Cholesterol, Total  Date Value Ref Range Status  01/18/2022 171 100 - 199 mg/dL Final   Cholesterol  Date Value Ref Range Status  10/23/2022 180 <200 mg/dL Final   LDL Cholesterol (Calc)  Date Value Ref Range Status  10/23/2022 97 mg/dL (calc) Final    Comment:    Reference range: <100 . Desirable range <100 mg/dL for primary prevention;   <70 mg/dL for patients with CHD or diabetic patients  with > or = 2 CHD risk factors. Marland Kitchen LDL-C is now calculated using the Martin-Hopkins  calculation, which is a validated novel method providing  better accuracy than the Friedewald equation in the  estimation of LDL-C.  Horald Pollen et al. Lenox Ahr. 1610;960(45): 2061-2068  (http://education.[facility].com/faq/FAQ164)    HDL  Date Value Ref Range Status  10/23/2022 63 > OR = 50 mg/dL Final  40/98/1191 61 >47 mg/dL Final   Triglycerides  Date Value Ref Range Status  10/23/2022 100 <150 mg/dL Final         Passed - Patient is not pregnant      Passed - Valid encounter within last 12 months    Recent Outpatient Visits           4 months ago HFrEF (heart failure with reduced ejection fraction) [facility])   [place] [facility] [place] [place], Netta Neat, DO   10 months ago Annual physical exam   [place] [facility] Smitty Cords, DO   11 months ago Essential hypertension   [place] [facility] Smitty Cords, DO   1 year ago Persistent atrial fibrillation [facility])   [place] [facility] Smitty Cords, DO   1 year ago Essential hypertension   [facility] Phineas Inches, MD       Future Appointments             In 1 month Althea Charon, Netta Neat, DO  [facility], [facility]

## 2023-09-11 DIAGNOSIS — I679 Cerebrovascular disease, unspecified: Secondary | ICD-10-CM | POA: Diagnosis not present

## 2023-09-11 DIAGNOSIS — E785 Hyperlipidemia, unspecified: Secondary | ICD-10-CM | POA: Diagnosis not present

## 2023-09-11 DIAGNOSIS — I10 Essential (primary) hypertension: Secondary | ICD-10-CM | POA: Diagnosis not present

## 2023-09-11 DIAGNOSIS — K219 Gastro-esophageal reflux disease without esophagitis: Secondary | ICD-10-CM | POA: Diagnosis not present

## 2023-09-11 DIAGNOSIS — I429 Cardiomyopathy, unspecified: Secondary | ICD-10-CM | POA: Diagnosis not present

## 2023-09-11 DIAGNOSIS — I480 Paroxysmal atrial fibrillation: Secondary | ICD-10-CM | POA: Diagnosis not present

## 2023-09-11 DIAGNOSIS — E66811 Obesity, class 1: Secondary | ICD-10-CM | POA: Diagnosis not present

## 2023-09-11 DIAGNOSIS — I5022 Chronic systolic (congestive) heart failure: Secondary | ICD-10-CM | POA: Diagnosis not present

## 2023-09-17 DIAGNOSIS — I480 Paroxysmal atrial fibrillation: Secondary | ICD-10-CM | POA: Diagnosis not present

## 2023-09-17 DIAGNOSIS — I5020 Unspecified systolic (congestive) heart failure: Secondary | ICD-10-CM | POA: Diagnosis not present

## 2023-09-17 DIAGNOSIS — I5022 Chronic systolic (congestive) heart failure: Secondary | ICD-10-CM | POA: Diagnosis not present

## 2023-10-08 ENCOUNTER — Encounter: Payer: Self-pay | Admitting: Urgent Care

## 2023-10-22 NOTE — OR Nursing (Signed)
 Noted pt scheduled for colonoscopy 10/23/23 ;on Eliquis and Plavix ; called pt to verify placed on hold; pt stated to have held only 2 days as of today (need hold 5-7days); informed pt appt would need to be rescheduled and that office would be notifying her

## 2023-10-24 ENCOUNTER — Other Ambulatory Visit: Payer: Self-pay | Admitting: *Deleted

## 2023-10-24 DIAGNOSIS — E538 Deficiency of other specified B group vitamins: Secondary | ICD-10-CM

## 2023-10-24 DIAGNOSIS — D509 Iron deficiency anemia, unspecified: Secondary | ICD-10-CM

## 2023-10-25 ENCOUNTER — Inpatient Hospital Stay: Payer: PPO | Attending: Internal Medicine

## 2023-10-25 ENCOUNTER — Inpatient Hospital Stay: Payer: PPO | Admitting: Internal Medicine

## 2023-10-25 ENCOUNTER — Encounter: Payer: Self-pay | Admitting: Internal Medicine

## 2023-10-25 VITALS — BP 150/90 | HR 74 | Temp 98.1°F | Resp 18 | Ht 64.0 in | Wt 190.8 lb

## 2023-10-25 DIAGNOSIS — R5383 Other fatigue: Secondary | ICD-10-CM | POA: Insufficient documentation

## 2023-10-25 DIAGNOSIS — Z803 Family history of malignant neoplasm of breast: Secondary | ICD-10-CM | POA: Diagnosis not present

## 2023-10-25 DIAGNOSIS — D125 Benign neoplasm of sigmoid colon: Secondary | ICD-10-CM | POA: Diagnosis not present

## 2023-10-25 DIAGNOSIS — K219 Gastro-esophageal reflux disease without esophagitis: Secondary | ICD-10-CM | POA: Insufficient documentation

## 2023-10-25 DIAGNOSIS — I509 Heart failure, unspecified: Secondary | ICD-10-CM | POA: Insufficient documentation

## 2023-10-25 DIAGNOSIS — D124 Benign neoplasm of descending colon: Secondary | ICD-10-CM | POA: Insufficient documentation

## 2023-10-25 DIAGNOSIS — Z7902 Long term (current) use of antithrombotics/antiplatelets: Secondary | ICD-10-CM | POA: Insufficient documentation

## 2023-10-25 DIAGNOSIS — D509 Iron deficiency anemia, unspecified: Secondary | ICD-10-CM | POA: Insufficient documentation

## 2023-10-25 DIAGNOSIS — Z79899 Other long term (current) drug therapy: Secondary | ICD-10-CM | POA: Insufficient documentation

## 2023-10-25 DIAGNOSIS — I6529 Occlusion and stenosis of unspecified carotid artery: Secondary | ICD-10-CM | POA: Insufficient documentation

## 2023-10-25 DIAGNOSIS — I4891 Unspecified atrial fibrillation: Secondary | ICD-10-CM | POA: Diagnosis not present

## 2023-10-25 DIAGNOSIS — K5730 Diverticulosis of large intestine without perforation or abscess without bleeding: Secondary | ICD-10-CM | POA: Diagnosis not present

## 2023-10-25 DIAGNOSIS — D123 Benign neoplasm of transverse colon: Secondary | ICD-10-CM | POA: Diagnosis not present

## 2023-10-25 DIAGNOSIS — Z860101 Personal history of adenomatous and serrated colon polyps: Secondary | ICD-10-CM | POA: Diagnosis not present

## 2023-10-25 DIAGNOSIS — E785 Hyperlipidemia, unspecified: Secondary | ICD-10-CM | POA: Diagnosis not present

## 2023-10-25 DIAGNOSIS — R011 Cardiac murmur, unspecified: Secondary | ICD-10-CM | POA: Diagnosis not present

## 2023-10-25 DIAGNOSIS — R531 Weakness: Secondary | ICD-10-CM | POA: Diagnosis not present

## 2023-10-25 DIAGNOSIS — D122 Benign neoplasm of ascending colon: Secondary | ICD-10-CM | POA: Insufficient documentation

## 2023-10-25 DIAGNOSIS — M129 Arthropathy, unspecified: Secondary | ICD-10-CM | POA: Diagnosis not present

## 2023-10-25 DIAGNOSIS — E611 Iron deficiency: Secondary | ICD-10-CM

## 2023-10-25 DIAGNOSIS — E538 Deficiency of other specified B group vitamins: Secondary | ICD-10-CM | POA: Diagnosis not present

## 2023-10-25 DIAGNOSIS — Z8673 Personal history of transient ischemic attack (TIA), and cerebral infarction without residual deficits: Secondary | ICD-10-CM | POA: Diagnosis not present

## 2023-10-25 DIAGNOSIS — I10 Essential (primary) hypertension: Secondary | ICD-10-CM | POA: Insufficient documentation

## 2023-10-25 LAB — CBC WITH DIFFERENTIAL (CANCER CENTER ONLY)
Abs Immature Granulocytes: 0.01 10*3/uL (ref 0.00–0.07)
Basophils Absolute: 0 10*3/uL (ref 0.0–0.1)
Basophils Relative: 1 %
Eosinophils Absolute: 0.1 10*3/uL (ref 0.0–0.5)
Eosinophils Relative: 3 %
HCT: 41.1 % (ref 36.0–46.0)
Hemoglobin: 13.6 g/dL (ref 12.0–15.0)
Immature Granulocytes: 0 %
Lymphocytes Relative: 26 %
Lymphs Abs: 1.1 10*3/uL (ref 0.7–4.0)
MCH: 31.3 pg (ref 26.0–34.0)
MCHC: 33.1 g/dL (ref 30.0–36.0)
MCV: 94.7 fL (ref 80.0–100.0)
Monocytes Absolute: 0.4 10*3/uL (ref 0.1–1.0)
Monocytes Relative: 10 %
Neutro Abs: 2.5 10*3/uL (ref 1.7–7.7)
Neutrophils Relative %: 60 %
Platelet Count: 222 10*3/uL (ref 150–400)
RBC: 4.34 MIL/uL (ref 3.87–5.11)
RDW: 12.6 % (ref 11.5–15.5)
WBC Count: 4.2 10*3/uL (ref 4.0–10.5)
nRBC: 0 % (ref 0.0–0.2)

## 2023-10-25 LAB — BASIC METABOLIC PANEL - CANCER CENTER ONLY
Anion gap: 9 (ref 5–15)
BUN: 17 mg/dL (ref 8–23)
CO2: 28 mmol/L (ref 22–32)
Calcium: 9 mg/dL (ref 8.9–10.3)
Chloride: 101 mmol/L (ref 98–111)
Creatinine: 1.06 mg/dL — ABNORMAL HIGH (ref 0.44–1.00)
GFR, Estimated: 56 mL/min — ABNORMAL LOW (ref >60)
Glucose, Bld: 107 mg/dL — ABNORMAL HIGH (ref 70–99)
Potassium: 4.1 mmol/L (ref 3.5–5.1)
Sodium: 138 mmol/L (ref 135–145)

## 2023-10-25 LAB — FERRITIN: Ferritin: 58 ng/mL (ref 11–307)

## 2023-10-25 LAB — IRON AND TIBC
Iron: 61 ug/dL (ref 28–170)
Saturation Ratios: 13 % (ref 10.4–31.8)
TIBC: 456 ug/dL — ABNORMAL HIGH (ref 250–450)
UIBC: 395 ug/dL

## 2023-10-25 LAB — VITAMIN B12: Vitamin B-12: 564 pg/mL (ref 180–914)

## 2023-10-25 NOTE — Progress Notes (Signed)
 [facility] CONSULT NOTE  Patient Care Team: Raina Bunting, DO as PCP - General (Family Medicine) Gwyn Leos, MD as Consulting Physician (Oncology) Rosa College, MD as Consulting Physician (Ophthalmology)  CHIEF COMPLAINTS/PURPOSE OF CONSULTATION:  Iron /B12 deficiency  #  Oncology History   No history exists.   HISTORY OF PRESENTING ILLNESS: Alone.  Ambulating independently.  Ellen Baker 73 y.o.  female history of iron  deficiency/B12 deficiency;A-fib on  pradaxa and CHF   is here for follow-up.   Patient has been NOT been on oral iron  supplementation sec to constipation.  She is also on B12 supplementation once a week.  Patient denies any blood in stools black or stools daily nausea vomiting.  Has any tingling or numbness.  No falls.  Review of Systems  Constitutional:  Positive for malaise/fatigue. Negative for chills, diaphoresis, fever and weight loss.  HENT:  Negative for nosebleeds and sore throat.   Eyes:  Negative for double vision.  Respiratory:  Negative for cough, hemoptysis, sputum production, shortness of breath and wheezing.   Cardiovascular:  Negative for chest pain, palpitations, orthopnea and leg swelling.  Gastrointestinal:  Negative for abdominal pain, blood in stool, constipation, diarrhea, heartburn, melena, nausea and vomiting.  Genitourinary:  Negative for dysuria, frequency and urgency.  Musculoskeletal:  Negative for back pain and joint pain.  Skin: Negative.  Negative for itching and rash.  Neurological:  Negative for dizziness, tingling, focal weakness, weakness and headaches.  Endo/Heme/Allergies:  Does not bruise/bleed easily.  Psychiatric/Behavioral:  Negative for depression. The patient is not nervous/anxious and does not have insomnia.      MEDICAL HISTORY:  Past Medical History:  Diagnosis Date   Arthritis    right knee   Atrial fibrillation (HCC)    Carotid artery occlusion    Dizziness     GERD (gastroesophageal reflux disease)    Heart murmur    History of colon polyps    Hyperlipidemia    Hypertension    Iron  deficiency    Stroke (HCC) 12/31/2012   no residual effects    SURGICAL HISTORY: Past Surgical History:  Procedure Laterality Date   CAROTID ENDARTERECTOMY     RIGHT  01/19/13   CATARACT EXTRACTION W/PHACO Right 04/08/2023   Procedure: CATARACT EXTRACTION PHACO AND INTRAOCULAR LENS PLACEMENT (IOC) RIGHT 7.70 00:52.9;  Surgeon: Rosa College, MD;  Location: [facility];  Service: Ophthalmology;  Laterality: Right;   CATARACT EXTRACTION W/PHACO Left 04/29/2023   Procedure: CATARACT EXTRACTION PHACO AND INTRAOCULAR LENS PLACEMENT (IOC) LEFT 7.08 00:43.2;  Surgeon: Rosa College, MD;  Location: [facility];  Service: Ophthalmology;  Laterality: Left;   COLONOSCOPY  08/18/2012   COLONOSCOPY WITH PROPOFOL  N/A 03/10/2018   Procedure: COLONOSCOPY WITH PROPOFOL ;  Surgeon: Deveron Fly, MD;  Location: [facility];  Service: Endoscopy;  Laterality: N/A;   DILATION AND CURETTAGE OF UTERUS     ENDARTERECTOMY Right 01/19/2013   Procedure: ENDARTERECTOMY CAROTID with patch angioplasty;  Surgeon: Arvil Lauber, MD;  Location: [facility] OR;  Service: Vascular;  Laterality: Right;   ESOPHAGEAL DILATION     JOINT REPLACEMENT Left 04/15/2017   Knee   taotal knee arthorplasty Right 2021   TOTAL KNEE ARTHROPLASTY Right 12/01/2019   Procedure: TOTAL KNEE ARTHROPLASTY - RNFA;  Surgeon: Elner Hahn, MD;  Location: [facility] ORS;  Service: Orthopedics;  Laterality: Right;   TUBAL LIGATION     UPPER GI ENDOSCOPY  08/18/2012    SOCIAL HISTORY: Social  History   Socioeconomic History   Marital status: Widowed    Spouse name: Not on file   Number of children: 3   Years of education: Not on file   Highest education level: 12th grade  Occupational History   Occupation: retired  Tobacco Use   Smoking status: Never   Smokeless tobacco: Never   Tobacco  comments:    Smoking cessation materials not required  Vaping Use   Vaping status: Never Used  Substance and Sexual Activity   Alcohol use: Yes    Alcohol/week: 1.0 standard drink of alcohol    Types: 1 Standard drinks or equivalent per week    Comment: Yearly   Drug use: No   Sexual activity: Never  Other Topics Concern   Not on file  Social History Narrative   Pt lives alone.    Social Drivers of Corporate investment banker Strain: High Risk (08/28/2023)   Received from [facility]   Overall Financial Resource Strain (CARDIA)    Difficulty of Paying Living Expenses: Very hard  Food Insecurity: No Food Insecurity (08/28/2023)   Received from [facility]   Hunger Vital Sign    Worried About Running Out of Food in the Last Year: Never true    Ran Out of Food in the Last Year: Never true  Transportation Needs: No Transportation Needs (08/28/2023)   Received from [facility] - Transportation    In the past 12 months, has lack of transportation kept you from medical appointments or from getting medications?: No    Lack of Transportation (Non-Medical): No  Physical Activity: Insufficiently Active (07/19/2023)   Exercise Vital Sign    Days of Exercise per Week: 3 days    Minutes of Exercise per Session: 30 min  Stress: No Stress Concern Present (07/19/2023)   [employer] of Occupational Health - Occupational Stress Questionnaire    Feeling of Stress : Not at all  Social Connections: Moderately Isolated (07/19/2023)   Social Connection and Isolation Panel [NHANES]    Frequency of Communication with Friends and Family: More than three times a week    Frequency of Social Gatherings with Friends and Family: Twice a week    Attends Religious Services: More than 4 times per year    Active Member of [employer] or Organizations: No    Attends Banker Meetings: Never    Marital Status: Widowed  Intimate Partner  Violence: Not At Risk (07/19/2023)   Humiliation, Afraid, Rape, and Kick questionnaire    Fear of Current or Ex-Partner: No    Emotionally Abused: No    Physically Abused: No    Sexually Abused: No    FAMILY HISTORY: Family History  Problem Relation Age of Onset   Stroke Mother    Deep vein thrombosis Mother    Heart disease Mother        Amputation-   Hypertension Mother    Alzheimer's disease Mother    Heart failure Father    Heart disease Father        CHF   Hypertension Father    Varicose Veins Father    Parkinson's disease Father    Breast cancer Paternal Aunt 85    ALLERGIES:  is allergic to adhesive [tape].  MEDICATIONS:  Current Outpatient Medications  Medication Sig Dispense Refill   alendronate  (FOSAMAX ) 70 MG tablet TAKE 1 TABLET(70 MG) BY MOUTH 1 TIME A WEEK WITH A FULL GLASS  OF WATER AND ON AN EMPTY STOMACH 12 tablet 1   Calcium Carbonate-Vitamin D3 600-400 MG-UNIT TABS Take 2 tablets by mouth daily.     famotidine  (PEPCID ) 20 MG tablet TAKE 1 TABLET(20 MG) BY MOUTH TWICE DAILY 60 tablet 11   ibuprofen (ADVIL) 200 MG tablet Take by mouth.     Iron -Vitamin C  65-125 MG TABS Take 1 tablet 3 days a week and 2 tablets 4 days a week for iron  deficiency. 180 tablet 1   loratadine  (CLARITIN ) 10 MG tablet Take 1 tablet (10 mg total) by mouth continuous as needed for allergies. 30 tablet 6   losartan  (COZAAR ) 25 MG tablet Take 1 tablet (25 mg total) by mouth daily. 90 tablet 3   meclizine  (ANTIVERT ) 25 MG tablet Take 1 tablet (25 mg total) by mouth 3 (three) times daily as needed for dizziness. 30 tablet 0   memantine  (NAMENDA ) 5 MG tablet Take 1 tablet (5 mg total) by mouth 2 (two) times daily. 60 tablet 11   Multiple Vitamins-Minerals (CENTRUM SILVER ULTRA WOMENS) TABS Take 1 tablet by mouth daily.     mupirocin  ointment (BACTROBAN ) 2 % Apply 1 Application topically 2 (two) times daily. For 1-2 weeks on topical for skin infection 22 g 0   simvastatin  (ZOCOR ) 20 MG tablet  TAKE 1 TABLET(20 MG) BY MOUTH DAILY 90 tablet 0   traZODone  (DESYREL ) 50 MG tablet Take 0.5 tablets (25 mg total) by mouth at bedtime. 45 tablet 3   amiodarone (PACERONE) 200 MG tablet Take by mouth.     dabigatran (PRADAXA) 150 MG CAPS capsule Take 150 mg by mouth 2 (two) times daily. (Patient not taking: Reported on 10/25/2023)     eplerenone  (INSPRA ) 25 MG tablet Take 1 tablet (25 mg total) by mouth daily. 30 tablet 11   furosemide  (LASIX ) 20 MG tablet Take 1 tablet (20 mg total) by mouth daily. 30 tablet 0   metoprolol  tartrate (LOPRESSOR ) 50 MG tablet Take 1 tablet (50 mg total) by mouth 2 (two) times daily. 180 tablet 3   vitamin B-12 (CYANOCOBALAMIN ) 1000 MCG tablet Take 1,000 mcg by mouth once a week.     No current facility-administered medications for this visit.      Aaron Aas  PHYSICAL EXAMINATION: ECOG PERFORMANCE STATUS: 0 - Asymptomatic  Vitals:   10/25/23 1443 10/25/23 1508  BP: (!) 165/90 (!) 150/90  Pulse: 74   Resp: 18   Temp: 98.1 F (36.7 C)   SpO2: 100%    Filed Weights   10/25/23 1443  Weight: 190 lb 12.8 oz (86.5 kg)    Physical Exam Vitals and nursing note reviewed.  Constitutional:      Comments:     HENT:     Head: Normocephalic and atraumatic.     Mouth/Throat:     Mouth: Mucous membranes are moist.     Pharynx: No oropharyngeal exudate.  Eyes:     Pupils: Pupils are equal, round, and reactive to light.  Cardiovascular:     Rate and Rhythm: Normal rate and regular rhythm.  Pulmonary:     Effort: No respiratory distress.     Breath sounds: No wheezing.     Comments: Decreased breath sounds bilaterally at bases.  No wheeze or crackles Abdominal:     General: Bowel sounds are normal. There is no distension.     Palpations: Abdomen is soft. There is no mass.     Tenderness: There is no abdominal tenderness. There is no guarding or  rebound.  Musculoskeletal:        General: No tenderness. Normal range of motion.     Cervical back: Normal range of  motion and neck supple.  Skin:    General: Skin is warm.  Neurological:     Mental Status: She is alert and oriented to person, place, and time.  Psychiatric:        Mood and Affect: Affect normal.        Judgment: Judgment normal.     LABORATORY DATA:  I have reviewed the data as listed Lab Results  Component Value Date   WBC 4.2 10/25/2023   HGB 13.6 10/25/2023   HCT 41.1 10/25/2023   MCV 94.7 10/25/2023   PLT 222 10/25/2023   Recent Labs    01/22/23 1400 04/25/23 1403 10/25/23 1445  NA 138 138 138  K 3.3* 3.9 4.1  CL 102 104 101  CO2 28 27 28   GLUCOSE 113* 126* 107*  BUN 9 15 17   CREATININE 0.85 0.99 1.06*  CALCIUM 8.8* 8.6* 9.0  GFRNONAA >60 >60 56*  PROT 7.4 6.8  --   ALBUMIN 3.7 3.4*  --   AST 24 22  --   ALT 17 18  --   ALKPHOS 75 63  --   BILITOT 1.0 0.5  --     RADIOGRAPHIC STUDIES: I have personally reviewed the radiological images as listed and agreed with the findings in the report. No results found.  ASSESSMENT & PLAN:   Iron  deficiency # iron  deficiency-  on oral iron  tolerating well.  [SEP 2023- iron  sat-4; ferritin- 33]- not on PO iron . #Recommend gentle iron  [iron  biglycinate; 28 mg ] 1 pill a day.  This pill is unlikely to cause stomach upset or cause constipation.    # ETIOLOGY: EGD on 08/18/2012; Colonoscopy on 03/10/2018 revealed diverticulosis in the sigmoid colon, in the descending colon, in the transverse colon and in the ascending colon.  There was one 3 mm polyp at the hepatic flexure (tubular adenoma) and two 4 to 6 mm polyps (hyperplastic) in the sigmoid colon.awaiting colonoscopy- may 28th, 2025- pending.    # B12 deficiency: AUG 2024- > 1000. Continue B12 pill once a week;   # A-fib/CHF- on Pradaxa [per cardiology; Dr.Callwood]  # DISPOSITION: # # No venofer # follow up in 12 months- MD; 2-3 days prior-labs- cbc/cmp/B12 levels; Iron  studies/ferritin; possible venofer - Dr.B    All questions were answered. The patient knows  to call the clinic with any problems, questions or concerns.     Gwyn Leos, MD 10/25/2023 4:12 PM

## 2023-10-25 NOTE — Patient Instructions (Signed)
#  Recommend gentle iron [iron biglycinate; 28 mg ] 1 pill a day.  This pill is unlikely to cause stomach upset or cause constipation.

## 2023-10-25 NOTE — Assessment & Plan Note (Addendum)
#   iron  deficiency-  on oral iron  tolerating well.  [SEP 2023- iron  sat-4; ferritin- 33]- not on PO iron . #Recommend gentle iron  [iron  biglycinate; 28 mg ] 1 pill a day.  This pill is unlikely to cause stomach upset or cause constipation.    # ETIOLOGY: EGD on 08/18/2012; Colonoscopy on 03/10/2018 revealed diverticulosis in the sigmoid colon, in the descending colon, in the transverse colon and in the ascending colon.  There was one 3 mm polyp at the hepatic flexure (tubular adenoma) and two 4 to 6 mm polyps (hyperplastic) in the sigmoid colon.awaiting colonoscopy- may 28th, 2025- pending.    # B12 deficiency: AUG 2024- > 1000. Continue B12 pill once a week;   # A-fib/CHF- on Pradaxa [per cardiology; Dr.Callwood]  # DISPOSITION: # # No venofer # follow up in 12 months- MD; 2-3 days prior-labs- cbc/cmp/B12 levels; Iron  studies/ferritin; possible venofer - Dr.B

## 2023-10-25 NOTE — Progress Notes (Signed)
 Fatigue/weakness: YES Dyspena: YES WITH ACTIVITY Light headedness: NO Blood in stool: NO   Having a colonoscopy 11/13/23.

## 2023-10-29 ENCOUNTER — Other Ambulatory Visit: Payer: Self-pay

## 2023-10-29 DIAGNOSIS — R7309 Other abnormal glucose: Secondary | ICD-10-CM

## 2023-10-29 DIAGNOSIS — E785 Hyperlipidemia, unspecified: Secondary | ICD-10-CM | POA: Diagnosis not present

## 2023-10-29 DIAGNOSIS — I4819 Other persistent atrial fibrillation: Secondary | ICD-10-CM | POA: Diagnosis not present

## 2023-10-29 DIAGNOSIS — I10 Essential (primary) hypertension: Secondary | ICD-10-CM

## 2023-10-29 DIAGNOSIS — D649 Anemia, unspecified: Secondary | ICD-10-CM

## 2023-10-29 DIAGNOSIS — E559 Vitamin D deficiency, unspecified: Secondary | ICD-10-CM

## 2023-10-29 DIAGNOSIS — Z0000 Encounter for general adult medical examination without abnormal findings: Secondary | ICD-10-CM

## 2023-10-30 LAB — CBC WITH DIFFERENTIAL/PLATELET
Absolute Lymphocytes: 974 {cells}/uL (ref 850–3900)
Absolute Monocytes: 389 {cells}/uL (ref 200–950)
Basophils Absolute: 41 {cells}/uL (ref 0–200)
Basophils Relative: 1.4 %
Eosinophils Absolute: 139 {cells}/uL (ref 15–500)
Eosinophils Relative: 4.8 %
HCT: 40.8 % (ref 35.0–45.0)
Hemoglobin: 13.4 g/dL (ref 11.7–15.5)
MCH: 30.7 pg (ref 27.0–33.0)
MCHC: 32.8 g/dL (ref 32.0–36.0)
MCV: 93.6 fL (ref 80.0–100.0)
MPV: 10.6 fL (ref 7.5–12.5)
Monocytes Relative: 13.4 %
Neutro Abs: 1357 {cells}/uL — ABNORMAL LOW (ref 1500–7800)
Neutrophils Relative %: 46.8 %
Platelets: 211 10*3/uL (ref 140–400)
RBC: 4.36 10*6/uL (ref 3.80–5.10)
RDW: 12.7 % (ref 11.0–15.0)
Total Lymphocyte: 33.6 %
WBC: 2.9 10*3/uL — ABNORMAL LOW (ref 3.8–10.8)

## 2023-10-30 LAB — COMPLETE METABOLIC PANEL WITHOUT GFR
AG Ratio: 1.3 (calc) (ref 1.0–2.5)
ALT: 14 U/L (ref 6–29)
AST: 20 U/L (ref 10–35)
Albumin: 3.9 g/dL (ref 3.6–5.1)
Alkaline phosphatase (APISO): 60 U/L (ref 37–153)
BUN: 13 mg/dL (ref 7–25)
CO2: 33 mmol/L — ABNORMAL HIGH (ref 20–32)
Calcium: 9.4 mg/dL (ref 8.6–10.4)
Chloride: 103 mmol/L (ref 98–110)
Creat: 0.89 mg/dL (ref 0.60–1.00)
Globulin: 3 g/dL (ref 1.9–3.7)
Glucose, Bld: 94 mg/dL (ref 65–99)
Potassium: 4.1 mmol/L (ref 3.5–5.3)
Sodium: 141 mmol/L (ref 135–146)
Total Bilirubin: 0.7 mg/dL (ref 0.2–1.2)
Total Protein: 6.9 g/dL (ref 6.1–8.1)

## 2023-10-30 LAB — LIPID PANEL
Cholesterol: 223 mg/dL — ABNORMAL HIGH (ref <200)
HDL: 62 mg/dL (ref >=50)
LDL Cholesterol (Calc): 134 mg/dL — ABNORMAL HIGH
Non-HDL Cholesterol (Calc): 161 mg/dL — ABNORMAL HIGH (ref <130)
Total CHOL/HDL Ratio: 3.6 (calc) (ref <5.0)
Triglycerides: 143 mg/dL (ref <150)

## 2023-10-30 LAB — VITAMIN D 25 HYDROXY (VIT D DEFICIENCY, FRACTURES): Vit D, 25-Hydroxy: 25 ng/mL — ABNORMAL LOW (ref 30–100)

## 2023-10-30 LAB — HEMOGLOBIN A1C
Hgb A1c MFr Bld: 5.5 % (ref <5.7)
Mean Plasma Glucose: 111 mg/dL
eAG (mmol/L): 6.2 mmol/L

## 2023-10-30 LAB — TSH: TSH: 3.02 m[IU]/L (ref 0.40–4.50)

## 2023-10-31 ENCOUNTER — Encounter: Payer: Self-pay | Admitting: Internal Medicine

## 2023-11-05 ENCOUNTER — Encounter: Payer: Self-pay | Admitting: Family Medicine

## 2023-11-05 ENCOUNTER — Ambulatory Visit (INDEPENDENT_AMBULATORY_CARE_PROVIDER_SITE_OTHER): Payer: Self-pay | Admitting: Family Medicine

## 2023-11-05 ENCOUNTER — Encounter (INDEPENDENT_AMBULATORY_CARE_PROVIDER_SITE_OTHER): Payer: Self-pay

## 2023-11-05 VITALS — BP 130/82 | HR 75 | Ht 64.0 in | Wt 192.0 lb

## 2023-11-05 DIAGNOSIS — E785 Hyperlipidemia, unspecified: Secondary | ICD-10-CM | POA: Diagnosis not present

## 2023-11-05 DIAGNOSIS — Z0000 Encounter for general adult medical examination without abnormal findings: Secondary | ICD-10-CM

## 2023-11-05 DIAGNOSIS — I10 Essential (primary) hypertension: Secondary | ICD-10-CM | POA: Diagnosis not present

## 2023-11-05 DIAGNOSIS — F5104 Psychophysiologic insomnia: Secondary | ICD-10-CM

## 2023-11-05 MED ORDER — TRAZODONE HCL 50 MG PO TABS: 25.0000 mg | ORAL_TABLET | Freq: Every day | ORAL | 3 refills | Status: AC

## 2023-11-05 MED ORDER — METOPROLOL TARTRATE 50 MG PO TABS: 50.0000 mg | ORAL_TABLET | Freq: Two times a day (BID) | ORAL | 3 refills | Status: AC

## 2023-11-05 MED ORDER — SIMVASTATIN 20 MG PO TABS: 20.0000 mg | ORAL_TABLET | Freq: Every day | ORAL | 3 refills | Status: AC

## 2023-11-05 NOTE — Progress Notes (Signed)
 Subjective:    Patient ID: Ellen Baker, female    DOB: 1951-02-20, 73 y.o.   MRN: [id number]  Ellen Baker is a 73 y.o. female presenting on 11/05/2023 for Annual Exam   HPI  Discussed the use of AI scribe software for clinical note transcription with the patient, who gave verbal consent to proceed.  History of Present Illness   Ellen Baker is a 73 year old female who presents for a routine annual visit.   She takes trazodone  for sleep at a dose of half a pill, metoprolol , which she needs refilled, losartan  for blood pressure, which she currently has enough of, and famotidine  for heartburn.   Elevated A1c Her A1c has improved to 5.5, indicating better glycemic control compared to previous elevated levels. Improving diet  HYPERLIPIDEMIA: Her total cholesterol is 220 and LDL is 134. She has been out of simvastatin , which she takes at night, and plans to resume it.  - Currently taking Simvastatin  20mg , tolerating well without side effects or myalgias   CHF, HFrEF Atrial Fibrillation persistent Followed [facility] Dr Beau Bound On Amiodarone and Metoprolol  Last ECHOCardiogram 04/16/22 On Eliquis 5mg  TWICE A DAY On Plavix  On Eplerenone  25mg  daily, request re order today On BB Metoprolol , Simvastatin   Upcoming ECHO   Anemia Last lab shows Hgb 13.4, stable from prior   S/p bilateral cataract surgeries .   Health Maintenance:   Colonoscopy 11/13/23 upcoming with Dr Magnolia Schroeder [facility] [place] - Previously 5 year repeat, Dr Peg Bouton retired.  - She has had colon polyps before   Mammogram last done 05/25/22, negative. Repeat yearly, 05/2023.scheduled   Due Prevnar-20 vaccine, at pharmacy if interested   Considering Shingrix Vaccine. Has some family members with newborn / infants upcoming later this year.  She also plans to get a flu shot and an RSV vaccine.       11/05/2023    9:21 AM 07/19/2023    2:10 PM 10/29/2022    3:21 PM   Depression screen PHQ 2/9  Decreased Interest 1 0 0  Down, Depressed, Hopeless 0 0 0  PHQ - 2 Score 1 0 0  Altered sleeping 0 0   Tired, decreased energy 0 0   Change in appetite 0 0   Feeling bad or failure about yourself  0 0   Trouble concentrating 0 0   Moving slowly or fidgety/restless 0 0   Suicidal thoughts 0 0   PHQ-9 Score 1 0   Difficult doing work/chores Somewhat difficult Not difficult at all        11/05/2023    9:21 AM 10/29/2022    3:21 PM 07/31/2022   10:03 AM 07/23/2022   10:10 AM  GAD 7 : Generalized Anxiety Score  Nervous, Anxious, on Edge 0 0 0 0  Control/stop worrying 0 0 0 0  Worry too much - different things 0 0 0 0  Trouble relaxing 0 0 0 0  Restless 0 0 0 0  Easily annoyed or irritable 0 0 0 0  Afraid - awful might happen 0 0 0 0  Total GAD 7 Score 0 0 0 0  Anxiety Difficulty Somewhat difficult  Not difficult at all Not difficult at all     Past Medical History:  Diagnosis Date   Arthritis    right knee   Atrial fibrillation (HCC)    B12 deficiency    Carotid artery occlusion    Cerebral infarction (HCC)    Dizziness  Esophageal reflux    GERD (gastroesophageal reflux disease)    Heart murmur    History of colon polyps    Hyperlipidemia    Hypertension    Iron  deficiency    Normocytic anemia    Occlusion and stenosis of unspecified carotid artery    Primary osteoarthritis of right knee    Stroke (HCC) 12/31/2012   no residual effects   Past Surgical History:  Procedure Laterality Date   CAROTID ENDARTERECTOMY     RIGHT  01/19/13   CATARACT EXTRACTION W/PHACO Right 04/08/2023   Procedure: CATARACT EXTRACTION PHACO AND INTRAOCULAR LENS PLACEMENT (IOC) RIGHT 7.70 00:52.9;  Surgeon: Rosa College, MD;  Location: [facility];  Service: Ophthalmology;  Laterality: Right;   CATARACT EXTRACTION W/PHACO Left 04/29/2023   Procedure: CATARACT EXTRACTION PHACO AND INTRAOCULAR LENS PLACEMENT (IOC) LEFT 7.08 00:43.2;  Surgeon:  Rosa College, MD;  Location: [facility];  Service: Ophthalmology;  Laterality: Left;   COLONOSCOPY  08/18/2012   COLONOSCOPY WITH PROPOFOL  N/A 03/10/2018   Procedure: COLONOSCOPY WITH PROPOFOL ;  Surgeon: Deveron Fly, MD;  Location: [facility] ENDOSCOPY;  Service: Endoscopy;  Laterality: N/A;   DILATION AND CURETTAGE OF UTERUS     ENDARTERECTOMY Right 01/19/2013   Procedure: ENDARTERECTOMY CAROTID with patch angioplasty;  Surgeon: Arvil Lauber, MD;  Location: [facility] OR;  Service: Vascular;  Laterality: Right;   ESOPHAGEAL DILATION     JOINT REPLACEMENT Left 04/15/2017   Knee   taotal knee arthorplasty Right 2021   TOTAL KNEE ARTHROPLASTY Right 12/01/2019   Procedure: TOTAL KNEE ARTHROPLASTY - RNFA;  Surgeon: Elner Hahn, MD;  Location: [facility] ORS;  Service: Orthopedics;  Laterality: Right;   TUBAL LIGATION     UPPER GI ENDOSCOPY  08/18/2012   Social History   Socioeconomic History   Marital status: Widowed    Spouse name: Not on file   Number of children: 3   Years of education: Not on file   Highest education level: 12th grade  Occupational History   Occupation: retired  Tobacco Use   Smoking status: Never   Smokeless tobacco: Never   Tobacco comments:    Smoking cessation materials not required  Vaping Use   Vaping status: Never Used  Substance and Sexual Activity   Alcohol use: Not Currently    Alcohol/week: 1.0 standard drink of alcohol    Types: 1 Standard drinks or equivalent per week    Comment: Yearly   Drug use: No   Sexual activity: Never  Other Topics Concern   Not on file  Social History Narrative   Pt lives alone.    Social Drivers of Corporate investment banker Strain: High Risk (08/28/2023)   Received [facility]   Overall Financial Resource Strain (CARDIA)    Difficulty of Paying Living Expenses: Very hard  Food Insecurity: No Food Insecurity (08/28/2023)   [facility] from [facility]   Hunger Vital  Sign    Worried About Running Out of Food in the Last Year: Never true    Ran Out of Food in the Last Year: Never true  Transportation Needs: No Transportation Needs (08/28/2023)   [facility] from [facility]    In the past 12 months, has lack of transportation kept you from medical appointments or from getting medications?: No    Lack of Transportation (Non-Medical): No  Physical Activity: Insufficiently Active (07/19/2023)   Exercise Vital Sign  Days of Exercise per Week: 3 days    Minutes of Exercise per Session: 30 min  Stress: No Stress Concern Present (07/19/2023)   [employer] of Occupational Health - Occupational Stress Questionnaire    Feeling of Stress : Not at all  Social Connections: Moderately Isolated (07/19/2023)   Social Connection and Isolation Panel [NHANES]    Frequency of Communication with Friends and Family: More than three times a week    Frequency of Social Gatherings with Friends and Family: Twice a week    Attends Religious Services: More than 4 times per year    Active Member of [employer] or Organizations: No    Attends Banker Meetings: Never    Marital Status: Widowed  Intimate Partner Violence: Not At Risk (07/19/2023)   Humiliation, Afraid, Rape, and Kick questionnaire    Fear of Current or Ex-Partner: No    Emotionally Abused: No    Physically Abused: No    Sexually Abused: No   Family History  Problem Relation Age of Onset   Stroke Mother    Deep vein thrombosis Mother    Heart disease Mother        Amputation-   Hypertension Mother    Alzheimer's disease Mother    Heart failure Father    Heart disease Father        CHF   Hypertension Father    Varicose Veins Father    Parkinson's disease Father    Breast cancer Paternal Aunt 55   Current Outpatient Medications on File Prior to Visit  Medication Sig   alendronate  (FOSAMAX ) 70 MG tablet TAKE 1 TABLET(70 MG) BY MOUTH 1 TIME A WEEK  WITH A FULL GLASS OF WATER AND ON AN EMPTY STOMACH   Calcium Carbonate-Vitamin D3 600-400 MG-UNIT TABS Take 2 tablets by mouth daily.   eplerenone  (INSPRA ) 25 MG tablet Take 1 tablet (25 mg total) by mouth daily.   famotidine  (PEPCID ) 20 MG tablet TAKE 1 TABLET(20 MG) BY MOUTH TWICE DAILY   ibuprofen (ADVIL) 200 MG tablet Take by mouth.   Iron -Vitamin C  65-125 MG TABS Take 1 tablet 3 days a week and 2 tablets 4 days a week for iron  deficiency.   loratadine  (CLARITIN ) 10 MG tablet Take 1 tablet (10 mg total) by mouth continuous as needed for allergies.   losartan  (COZAAR ) 25 MG tablet Take 1 tablet (25 mg total) by mouth daily.   meclizine  (ANTIVERT ) 25 MG tablet Take 1 tablet (25 mg total) by mouth 3 (three) times daily as needed for dizziness.   memantine  (NAMENDA ) 5 MG tablet Take 1 tablet (5 mg total) by mouth 2 (two) times daily.   Multiple Vitamins-Minerals (CENTRUM SILVER ULTRA WOMENS) TABS Take 1 tablet by mouth daily.   vitamin B-12 (CYANOCOBALAMIN ) 1000 MCG tablet Take 1,000 mcg by mouth once a week.   amiodarone (PACERONE) 200 MG tablet Take by mouth.   dabigatran (PRADAXA) 150 MG CAPS capsule Take 150 mg by mouth 2 (two) times daily. (Patient not taking: Reported on 10/25/2023)   furosemide  (LASIX ) 20 MG tablet Take 1 tablet (20 mg total) by mouth daily.   mupirocin  ointment (BACTROBAN ) 2 % Apply 1 Application topically 2 (two) times daily. For 1-2 weeks on topical for skin infection (Patient not taking: Reported on 11/05/2023)   No current facility-administered medications on file prior to visit.    Review of Systems  Constitutional:  Negative for activity change, appetite change, chills, diaphoresis, fatigue and fever.  HENT:  Negative for congestion and hearing loss.   Eyes:  Negative for visual disturbance.  Respiratory:  Negative for cough, chest tightness, shortness of breath and wheezing.   Cardiovascular:  Negative for chest pain, palpitations and leg swelling.   Gastrointestinal:  Negative for abdominal pain, constipation, diarrhea, nausea and vomiting.  Genitourinary:  Negative for dysuria, frequency and hematuria.  Musculoskeletal:  Negative for arthralgias and neck pain.  Skin:  Negative for rash.  Neurological:  Negative for dizziness, weakness, light-headedness, numbness and headaches.  Hematological:  Negative for adenopathy.  Psychiatric/Behavioral:  Negative for behavioral problems, dysphoric mood and sleep disturbance.    Per HPI unless specifically indicated above     Objective:     BP 130/82 (BP Location: Left Arm, Patient Position: Sitting, Cuff Size: Normal)   Pulse 75   Ht 5\' 4"  (1.626 m)   Wt 192 lb (87.1 kg)   SpO2 99%   BMI 32.96 kg/m   Wt Readings from Last 3 Encounters:  11/05/23 192 lb (87.1 kg)  10/25/23 190 lb 12.8 oz (86.5 kg)  06/04/23 200 lb (90.7 kg)    Physical Exam Vitals and nursing note reviewed.  Constitutional:      General: She is not in acute distress.    Appearance: She is well-developed. She is not diaphoretic.     Comments: Well-appearing, comfortable, cooperative  HENT:     Head: Normocephalic and atraumatic.  Eyes:     General:        Right eye: No discharge.        Left eye: No discharge.     Conjunctiva/sclera: Conjunctivae normal.     Pupils: Pupils are equal, round, and reactive to light.  Neck:     Thyroid : No thyromegaly.  Cardiovascular:     Rate and Rhythm: Normal rate and regular rhythm.     Pulses: Normal pulses.     Heart sounds: Normal heart sounds. No murmur heard. Pulmonary:     Effort: Pulmonary effort is normal. No respiratory distress.     Breath sounds: Normal breath sounds. No wheezing or rales.  Abdominal:     General: Bowel sounds are normal. There is no distension.     Palpations: Abdomen is soft. There is no mass.     Tenderness: There is no abdominal tenderness.  Musculoskeletal:        General: No tenderness. Normal range of motion.     Cervical back:  Normal range of motion and neck supple.     Right lower leg: No edema.     Left lower leg: No edema.     Comments: Upper / Lower Extremities: - Normal muscle tone, strength bilateral upper extremities 5/5, lower extremities 5/5  Lymphadenopathy:     Cervical: No cervical adenopathy.  Skin:    General: Skin is warm and dry.     Findings: No erythema or rash.  Neurological:     Mental Status: She is alert and oriented to person, place, and time.     Comments: Distal sensation intact to light touch all extremities  Psychiatric:        Mood and Affect: Mood normal.        Behavior: Behavior normal.        Thought Content: Thought content normal.     Comments: Well groomed, good eye contact, normal speech and thoughts     Results for orders placed or performed in visit on 10/29/23  VITAMIN D  25 Hydroxy (Vit-D Deficiency, Fractures)  Collection Time: 10/29/23  8:18 AM  Result Value Ref Range   Vit D, 25-Hydroxy 25 (L) 30 - 100 ng/mL  TSH   Collection Time: 10/29/23  8:18 AM  Result Value Ref Range   TSH 3.02 0.40 - 4.50 mIU/L  CBC with Differential/Platelet   Collection Time: 10/29/23  8:18 AM  Result Value Ref Range   WBC 2.9 (L) 3.8 - 10.8 Thousand/uL   RBC 4.36 3.80 - 5.10 Million/uL   Hemoglobin 13.4 11.7 - 15.5 g/dL   HCT 91.4 78.2 - 95.6 %   MCV 93.6 80.0 - 100.0 fL   MCH 30.7 27.0 - 33.0 pg   MCHC 32.8 32.0 - 36.0 g/dL   RDW 21.3 08.6 - 57.8 %   Platelets 211 140 - 400 Thousand/uL   MPV 10.6 7.5 - 12.5 fL   Neutro Abs 1,357 (L) 1,500 - 7,800 cells/uL   Absolute Lymphocytes 974 850 - 3,900 cells/uL   Absolute Monocytes 389 200 - 950 cells/uL   Eosinophils Absolute 139 15 - 500 cells/uL   Basophils Absolute 41 0 - 200 cells/uL   Neutrophils Relative % 46.8 %   Total Lymphocyte 33.6 %   Monocytes Relative 13.4 %   Eosinophils Relative 4.8 %   Basophils Relative 1.4 %  COMPLETE METABOLIC PANEL WITH GFR   Collection Time: 10/29/23  8:18 AM  Result Value Ref Range    Glucose, Bld 94 65 - 99 mg/dL   BUN 13 7 - 25 mg/dL   Creat 4.69 6.29 - 5.28 mg/dL   BUN/Creatinine Ratio SEE NOTE: 6 - 22 (calc)   Sodium 141 135 - 146 mmol/L   Potassium 4.1 3.5 - 5.3 mmol/L   Chloride 103 98 - 110 mmol/L   CO2 33 (H) 20 - 32 mmol/L   Calcium 9.4 8.6 - 10.4 mg/dL   Total Protein 6.9 6.1 - 8.1 g/dL   Albumin 3.9 3.6 - 5.1 g/dL   Globulin 3.0 1.9 - 3.7 g/dL (calc)   AG Ratio 1.3 1.0 - 2.5 (calc)   Total Bilirubin 0.7 0.2 - 1.2 mg/dL   Alkaline phosphatase (APISO) 60 37 - 153 U/L   AST 20 10 - 35 U/L   ALT 14 6 - 29 U/L  Lipid panel   Collection Time: 10/29/23  8:18 AM  Result Value Ref Range   Cholesterol 223 (H) <200 mg/dL   HDL 62 > OR = 50 mg/dL   Triglycerides 413 <244 mg/dL   LDL Cholesterol (Calc) 134 (H) mg/dL (calc)   Total CHOL/HDL Ratio 3.6 <5.0 (calc)   Non-HDL Cholesterol (Calc) 161 (H) <130 mg/dL (calc)  Hemoglobin W1U   Collection Time: 10/29/23  8:18 AM  Result Value Ref Range   Hgb A1c MFr Bld 5.5 <5.7 %   Mean Plasma Glucose 111 mg/dL   eAG (mmol/L) 6.2 mmol/L      Assessment & Plan:   Problem List Items Addressed This Visit     Dyslipidemia   Relevant Medications   simvastatin  (ZOCOR ) 20 MG tablet   Essential hypertension   Relevant Medications   simvastatin  (ZOCOR ) 20 MG tablet   metoprolol  tartrate (LOPRESSOR ) 50 MG tablet   Other Visit Diagnoses       Annual physical exam    -  Primary     Psychophysiological insomnia       Relevant Medications   traZODone  (DESYREL ) 50 MG tablet        Updated Health Maintenance information Reviewed recent lab results  with patient Encouraged improvement to lifestyle with diet and exercise Goal of weight loss  Elevated LDL cholesterol LDL increased to 134 mg/dL, total cholesterol 161 mg/dL. Simvastatin  not taken recently out of med. She prefers simvastatin , effective previously. - Renew simvastatin  20mg  prescription, send to [facility] in [place].  General Health  Maintenance Discussed immunizations and screenings. No COVID booster needed. Shingles vaccine discussed; concerns due to upcoming newborn infant contact with family members expecting children in June and Sept.  Pneumonia vaccine upgrade available here or at pharmacy - Check with pharmacy for updated pneumonia vaccine (Prevnar 20). - Proceed with scheduled colonoscopy on Nov 13, 2023. - Continue routine mammogram screenings. 05/2024       No orders of the defined types were placed in this encounter.   Meds ordered this encounter  Medications   simvastatin  (ZOCOR ) 20 MG tablet    Sig: Take 1 tablet (20 mg total) by mouth at bedtime.    Dispense:  90 tablet    Refill:  3   traZODone  (DESYREL ) 50 MG tablet    Sig: Take 0.5 tablets (25 mg total) by mouth at bedtime.    Dispense:  45 tablet    Refill:  3   metoprolol  tartrate (LOPRESSOR ) 50 MG tablet    Sig: Take 1 tablet (50 mg total) by mouth 2 (two) times daily.    Dispense:  180 tablet    Refill:  3     Follow up plan: Return for 6 month PreDM A1c, refills, weight.  Domingo Friend, DO [facility] 11/05/2023, 9:26 AM

## 2023-11-05 NOTE — Patient Instructions (Addendum)
 Thank you for coming to the office today.  Prevnar-20 updated pneumonia vaccine, ready whenever you. Every 5 years. Last 2019.  Consider Shingrix vaccine, 2-6 months apart. Okay to be around infants with this shot but NOT okay to be around infants WITH ACTUAL Shingles.  Restart cholesterol medication Simvastatin .  Continue current meds, refills sent.  Recent Labs    05/01/23 1604 10/29/23 0818  HGBA1C 5.7* 5.5   Improved blood sugar.  Slightly higher LDL cholesterol.  Please schedule a Follow-up Appointment to: Return for 6 month PreDM A1c, refills, weight.  If you have any other questions or concerns, please feel free to call the office or send a message through MyChart. You may also schedule an earlier appointment if necessary.  Additionally, you may be receiving a survey about your experience at our office within a few days to 1 week by e-mail or mail. We value your feedback.  Domingo Friend, DO [facility], [place]

## 2023-11-06 ENCOUNTER — Encounter: Payer: Self-pay | Admitting: Internal Medicine

## 2023-11-13 ENCOUNTER — Ambulatory Visit: Admitting: Anesthesiology

## 2023-11-13 ENCOUNTER — Ambulatory Visit
Admission: RE | Admit: 2023-11-13 | Discharge: 2023-11-13 | Disposition: A | Discharge status: Home / Self Care | Attending: Internal Medicine | Admitting: Internal Medicine

## 2023-11-13 ENCOUNTER — Encounter
Admission: RE | Disposition: A | Discharge status: Home / Self Care | Payer: Self-pay | Source: Home / Self Care | Attending: Internal Medicine

## 2023-11-13 DIAGNOSIS — D123 Benign neoplasm of transverse colon: Secondary | ICD-10-CM | POA: Insufficient documentation

## 2023-11-13 DIAGNOSIS — K635 Polyp of colon: Secondary | ICD-10-CM | POA: Diagnosis not present

## 2023-11-13 DIAGNOSIS — Z860101 Personal history of adenomatous and serrated colon polyps: Secondary | ICD-10-CM | POA: Diagnosis not present

## 2023-11-13 DIAGNOSIS — K648 Other hemorrhoids: Secondary | ICD-10-CM | POA: Diagnosis not present

## 2023-11-13 DIAGNOSIS — I4891 Unspecified atrial fibrillation: Secondary | ICD-10-CM | POA: Insufficient documentation

## 2023-11-13 DIAGNOSIS — K640 First degree hemorrhoids: Secondary | ICD-10-CM | POA: Diagnosis not present

## 2023-11-13 DIAGNOSIS — Z8673 Personal history of transient ischemic attack (TIA), and cerebral infarction without residual deficits: Secondary | ICD-10-CM | POA: Insufficient documentation

## 2023-11-13 DIAGNOSIS — Z1211 Encounter for screening for malignant neoplasm of colon: Secondary | ICD-10-CM | POA: Diagnosis not present

## 2023-11-13 DIAGNOSIS — Z7902 Long term (current) use of antithrombotics/antiplatelets: Secondary | ICD-10-CM | POA: Insufficient documentation

## 2023-11-13 DIAGNOSIS — I10 Essential (primary) hypertension: Secondary | ICD-10-CM | POA: Insufficient documentation

## 2023-11-13 DIAGNOSIS — I4819 Other persistent atrial fibrillation: Secondary | ICD-10-CM | POA: Diagnosis not present

## 2023-11-13 DIAGNOSIS — K219 Gastro-esophageal reflux disease without esophagitis: Secondary | ICD-10-CM | POA: Diagnosis not present

## 2023-11-13 DIAGNOSIS — K5730 Diverticulosis of large intestine without perforation or abscess without bleeding: Secondary | ICD-10-CM | POA: Diagnosis not present

## 2023-11-13 HISTORY — DX: Occlusion and stenosis of unspecified carotid artery: I65.29

## 2023-11-13 HISTORY — DX: Gastro-esophageal reflux disease without esophagitis: K21.9

## 2023-11-13 HISTORY — PX: COLONOSCOPY: SHX5424

## 2023-11-13 HISTORY — DX: Unilateral primary osteoarthritis, right knee: M17.11

## 2023-11-13 HISTORY — DX: Cerebral infarction, unspecified: I63.9

## 2023-11-13 HISTORY — DX: Anemia, unspecified: D64.9

## 2023-11-13 HISTORY — DX: Deficiency of other specified B group vitamins: E53.8

## 2023-11-13 SURGERY — COLONOSCOPY
Anesthesia: General

## 2023-11-13 MED ORDER — EPHEDRINE SULFATE-NACL 50-0.9 MG/10ML-% IV SOSY
PREFILLED_SYRINGE | INTRAVENOUS | Status: DC | PRN
  Administered 2023-11-13: 10 mg via INTRAVENOUS
  Administered 2023-11-13: 15 mg via INTRAVENOUS

## 2023-11-13 MED ORDER — SODIUM CHLORIDE 0.9 % IV SOLN
INTRAVENOUS | Status: DC
  Administered 2023-11-13: 20 mL/h via INTRAVENOUS

## 2023-11-13 MED ORDER — PROPOFOL 500 MG/50ML IV EMUL
INTRAVENOUS | Status: DC | PRN
  Administered 2023-11-13: 50 ug/kg/min via INTRAVENOUS

## 2023-11-13 MED ORDER — DEXMEDETOMIDINE HCL IN NACL 80 MCG/20ML IV SOLN
INTRAVENOUS | Status: DC | PRN
  Administered 2023-11-13: 20 ug via INTRAVENOUS

## 2023-11-13 MED ORDER — LIDOCAINE HCL (CARDIAC) PF 100 MG/5ML IV SOSY
PREFILLED_SYRINGE | INTRAVENOUS | Status: DC | PRN
  Administered 2023-11-13: 60 mg via INTRAVENOUS

## 2023-11-13 MED ORDER — PROPOFOL 10 MG/ML IV BOLUS
INTRAVENOUS | Status: DC | PRN
  Administered 2023-11-13: 50 mg via INTRAVENOUS
  Administered 2023-11-13: 20 mg via INTRAVENOUS

## 2023-11-13 NOTE — Interval H&P Note (Signed)
 History and Physical Interval Note:  11/13/2023 9:21 AM  Ellen Baker  has presented today for surgery, with the diagnosis of Z86.0101 (ICD-10-CM) - Hx of adenomatous colonic polyps.  The various methods of treatment have been discussed with the patient and family. After consideration of risks, benefits and other options for treatment, the patient has consented to  Procedure(s): COLONOSCOPY (N/A) as a surgical intervention.  The patient's history has been reviewed, patient examined, no change in status, stable for surgery.  I have reviewed the patient's chart and labs.  Questions were answered to the patient's satisfaction.     [place], Ellen Baker

## 2023-11-13 NOTE — Transfer of Care (Signed)
 Immediate Anesthesia Transfer of Care Note  Patient: Ellen Baker  Procedure(s) Performed: COLONOSCOPY  Patient Location: PACU  Anesthesia Type:General  Level of Consciousness: sedated  Airway & Oxygen Therapy: Patient Spontanous Breathing  Post-op Assessment: Report given to RN and Post -op Vital signs reviewed and stable  Post vital signs: Reviewed and stable  Last Vitals:  Vitals Value Taken Time  BP 83/59 11/13/23 0954  Temp 35.6 C 11/13/23 0950  Pulse 66 11/13/23 0954  Resp 20 11/13/23 0955  SpO2 100 % 11/13/23 0954  Vitals shown include unfiled device data.  Last Pain:  Vitals:   11/13/23 0950  TempSrc: Temporal  PainSc: Asleep         Complications: No notable events documented.

## 2023-11-13 NOTE — Anesthesia Preprocedure Evaluation (Signed)
 Anesthesia Evaluation  Patient identified by MRN, date of birth, ID band Patient awake    Reviewed: Allergy & Precautions, NPO status , Patient's Chart, lab work & pertinent test results  History of Anesthesia Complications Negative for: history of anesthetic complications  Airway Mallampati: III  TM Distance: <3 FB Neck ROM: full    Dental  (+) Chipped, Poor Dentition, Missing   Pulmonary neg pulmonary ROS, neg shortness of breath   Pulmonary exam normal        Cardiovascular Exercise Tolerance: Good hypertension, (-) Past MI + dysrhythmias Atrial Fibrillation      Neuro/Psych CVA  negative psych ROS   GI/Hepatic Neg liver ROS,GERD  Controlled,,  Endo/Other  negative endocrine ROS    Renal/GU negative Renal ROS  negative genitourinary   Musculoskeletal   Abdominal   Peds  Hematology negative hematology ROS (+)   Anesthesia Other Findings Past Medical History: No date: Arthritis     Comment:  right knee No date: Atrial fibrillation (HCC) No date: B12 deficiency No date: Carotid artery occlusion No date: Cerebral infarction (HCC) No date: Dizziness No date: Esophageal reflux No date: GERD (gastroesophageal reflux disease) No date: Heart murmur No date: History of colon polyps No date: Hyperlipidemia No date: Hypertension No date: Iron  deficiency No date: Normocytic anemia No date: Occlusion and stenosis of unspecified carotid artery No date: Primary osteoarthritis of right knee 12/31/2012: Stroke (HCC)     Comment:  no residual effects  Past Surgical History: No date: CAROTID ENDARTERECTOMY     Comment:  RIGHT  01/19/13 04/08/2023: CATARACT EXTRACTION W/PHACO; Right     Comment:  Procedure: CATARACT EXTRACTION PHACO AND INTRAOCULAR               LENS PLACEMENT (IOC) RIGHT 7.70 00:52.9;  Surgeon: Rosa College, MD;  Location: [facility];                Service:  Ophthalmology;  Laterality: Right; 04/29/2023: CATARACT EXTRACTION W/PHACO; Left     Comment:  Procedure: CATARACT EXTRACTION PHACO AND INTRAOCULAR               LENS PLACEMENT (IOC) LEFT 7.08 00:43.2;  Surgeon: Rosa College, MD;  Location: [facility];                Service: Ophthalmology;  Laterality: Left; 08/18/2012: COLONOSCOPY 03/10/2018: COLONOSCOPY WITH PROPOFOL ; N/A     Comment:  Procedure: COLONOSCOPY WITH PROPOFOL ;  Surgeon:               Deveron Fly, MD;  Location: [facility] ENDOSCOPY;                Service: Endoscopy;  Laterality: N/A; No date: DILATION AND CURETTAGE OF UTERUS 01/19/2013: ENDARTERECTOMY; Right     Comment:  Procedure: ENDARTERECTOMY CAROTID with patch               angioplasty;  Surgeon: Arvil Lauber, MD;  Location: [facility]               OR;  Service: Vascular;  Laterality: Right; No date: ESOPHAGEAL DILATION 04/15/2017: JOINT REPLACEMENT; Left     Comment:  Knee 2021: taotal knee arthorplasty; Right 12/01/2019: TOTAL KNEE ARTHROPLASTY; Right     Comment:  Procedure: TOTAL KNEE ARTHROPLASTY - RNFA;  Surgeon:  Poggi, Kaylene Pascal, MD;  Location: [facility] ORS;  Service:               Orthopedics;  Laterality: Right; No date: TUBAL LIGATION 08/18/2012: UPPER GI ENDOSCOPY  BMI    Body Mass Index: 32.65 kg/m      Reproductive/Obstetrics negative OB ROS                             Anesthesia Physical Anesthesia Plan  ASA: 3  Anesthesia Plan: General   Post-op Pain Management:    Induction: Intravenous  PONV Risk Score and Plan: Propofol  infusion and TIVA  Airway Management Planned: Natural Airway and Nasal Cannula  Additional Equipment:   Intra-op Plan:   Post-operative Plan:   Informed Consent: I have reviewed the patients History and Physical, chart, labs and discussed the procedure including the risks, benefits and alternatives for the proposed anesthesia with the patient or  authorized representative who has indicated his/her understanding and acceptance.     Dental Advisory Given  Plan Discussed with: Anesthesiologist, CRNA and Surgeon  Anesthesia Plan Comments: (Patient consented for risks of anesthesia including but not limited to:  - adverse reactions to medications - risk of airway placement if required - damage to eyes, teeth, lips or other oral mucosa - nerve damage due to positioning  - sore throat or hoarseness - Damage to heart, brain, nerves, lungs, other parts of body or loss of life  Patient voiced understanding and assent.)       Anesthesia Quick Evaluation

## 2023-11-13 NOTE — Anesthesia Postprocedure Evaluation (Signed)
 Anesthesia Post Note  Patient: Ellen Baker  Procedure(s) Performed: COLONOSCOPY  Patient location during evaluation: Endoscopy Anesthesia Type: General Level of consciousness: awake and alert Pain management: pain level controlled Vital Signs Assessment: post-procedure vital signs reviewed and stable Respiratory status: spontaneous breathing, nonlabored ventilation, respiratory function stable and patient connected to nasal cannula oxygen Cardiovascular status: blood pressure returned to baseline and stable Postop Assessment: no apparent nausea or vomiting Anesthetic complications: no   No notable events documented.   Last Vitals:  Vitals:   11/13/23 1003 11/13/23 1010  BP: (!) 100/39 (!) 122/53  Pulse: 74 62  Resp: (!) 24 16  Temp:    SpO2: 100% 100%    Last Pain:  Vitals:   11/13/23 1010  TempSrc:   PainSc: 0-No pain                 Portia Brittle Zubair Lofton

## 2023-11-13 NOTE — Op Note (Signed)
 [facility] Patient Name: Ellen Baker Procedure Date: 11/13/2023 9:20 AM MRN: [id number] Account #: [id number] Date of Birth: 06-01-1951 Admit Type: Outpatient Age: 73 Room: [facility] ENDO ROOM 3 Gender: Female Note Status: Finalized Instrument Name: Charlyn Cooley [id number] Procedure:             Colonoscopy Indications:           High risk colon cancer surveillance: Personal history                         of non-advanced adenoma Providers:             Ronee Ranganathan K. Corky Diener MD, MD Referring MD:          Raina Bunting (Referring MD) Medicines:             Propofol  per Anesthesia Complications:         No immediate complications. Estimated blood loss:                         Minimal. Procedure:             Pre-Anesthesia Assessment:                        - The risks and benefits of the procedure and the                         sedation options and risks were discussed with the                         patient. All questions were answered and informed                         consent was obtained.                        - Patient identification and proposed procedure were                         verified prior to the procedure by the nurse. The                         procedure was verified in the procedure room.                        - ASA Grade Assessment: III - A patient with severe                         systemic disease.                        - After reviewing the risks and benefits, the patient                         was deemed in satisfactory condition to undergo the                         procedure.                        After obtaining informed consent, the colonoscope  was                         passed under direct vision. Throughout the procedure,                         the patient's blood pressure, pulse, and oxygen                         saturations were monitored continuously. The                         Colonoscope was  introduced through the anus and                         advanced to the the cecum, identified by appendiceal                         orifice and ileocecal valve. The colonoscopy was                         performed without difficulty. The patient tolerated                         the procedure well. The quality of the bowel                         preparation was adequate. The ileocecal valve,                         appendiceal orifice, and rectum were photographed. Findings:      The perianal and digital rectal examinations were normal. Pertinent       negatives include normal sphincter tone and no palpable rectal lesions.      Non-bleeding internal hemorrhoids were found during retroflexion. The       hemorrhoids were Grade I (internal hemorrhoids that do not prolapse).      Two sessile polyps were found in the transverse colon. The polyps were 3       to 4 mm in size. These polyps were removed with a jumbo cold forceps.       Resection and retrieval were complete. Estimated blood loss was minimal.      The exam was otherwise without abnormality. Impression:            - Non-bleeding internal hemorrhoids.                        - Two 3 to 4 mm polyps in the transverse colon,                         removed with a jumbo cold forceps. Resected and                         retrieved.                        - The examination was otherwise normal. Recommendation:        - Patient has a contact number available for  emergencies. The signs and symptoms of potential                         delayed complications were discussed with the patient.                         Return to normal activities tomorrow. Written                         discharge instructions were provided to the patient.                        - Resume previous diet.                        - Continue present medications.                        - Repeat colonoscopy is recommended for surveillance.                          The colonoscopy date will be determined after                         pathology results from today's exam become available                         for review.                        - Return to GI office PRN.                        - The findings and recommendations were discussed with                         the patient. Procedure Code(s):     --- Professional ---                        [phone], Colonoscopy, flexible; with biopsy, single or                         multiple Diagnosis Code(s):     --- Professional ---                        K64.0, First degree hemorrhoids                        D12.3, Benign neoplasm of transverse colon (hepatic                         flexure or splenic flexure)                        Z86.010, Personal history of colonic polyps CPT copyright 2022 [employer]. All rights reserved. The codes documented in this report are preliminary and upon coder review may  be revised to meet current compliance requirements. Cassie Click MD, MD 11/13/2023 9:50:14 AM This report has been signed electronically. Number of Addenda: 0 Note Initiated On: 11/13/2023 9:20 AM Scope  Withdrawal Time: 0 hours 7 minutes 7 seconds  Total Procedure Duration: 0 hours 10 minutes 20 seconds  Estimated Blood Loss:  Estimated blood loss was minimal.      [facility]

## 2023-11-13 NOTE — H&P (Signed)
 Outpatient short stay form Pre-procedure 11/13/2023 9:19 AM Ellen Baker K. Corky Diener, M.D.  Primary Physician: Domingo Friend, M.D.  Reason for visit:  Personal history of adenomatous colon polyps  History of present illness:  Ellen Baker presents to the [facility] for chief complaint of high-risk colon cancer surveillance 2/2 personal hx of adenomatous colon polyps. She received colon letter in the mail. Last colonoscopy performed 02/2018 by Dr. Peg Bouton showed pandiverticulosis, one 3 mm TA removed from hepatic flexure, and two subcentimeter hyperplastic polyps removed from sigmoid colon. She denies any known family history of colorectal cancer, advanced adenomas, or IBD. She denies any acute GI complaints or concerns at this time. She reports her bowels are moving well without any issues. She denies any issues with hematochezia, melena, fecal urgency, or fecal incontinence. No unexplained abdominal pain or abdominal cramping. She no longer has any issue with GERD symptoms. No longer requiring daily acid suppression medication. She denies any UGI symptoms such as nausea, vomiting, esophageal dysphagia, odynophagia, early satiety, hoarseness, or epigastric abdominal pain. She reports she is no longer taking Eliquis because it was too expensive. She is supposed to be taking it, but cannot afford it at this time. She has appointment next month with Dr. Beau Bound in Cardiology. She does remain on long-term antiplatelet therapy with Plavix  due to hx of carotid endarterectomy 2014. She follows closely with Dr. Vonna Guardian in Vascular Surgery and gets annual ultrasounds of her carotids. No history of GI bleeding. She offers no other specific complaints or concerns at this time.      Current Facility-Administered Medications:    0.9 %  sodium chloride  infusion, , Intravenous, Continuous, Peterson, Jourdain Guay K, MD, Last Rate: 20 mL/hr at 11/13/23 1610, Continued from Pre-op at 11/13/23 0917  Medications Prior to  Admission  Medication Sig Dispense Refill Last Dose/Taking   alendronate  (FOSAMAX ) 70 MG tablet TAKE 1 TABLET(70 MG) BY MOUTH 1 TIME A WEEK WITH A FULL GLASS OF WATER AND ON AN EMPTY STOMACH 12 tablet 1 11/12/2023   Calcium Carbonate-Vitamin D3 600-400 MG-UNIT TABS Take 2 tablets by mouth daily.   Past Week   clopidogrel  (PLAVIX ) 75 MG tablet Take 75 mg by mouth daily.   Past Week   eplerenone  (INSPRA ) 25 MG tablet Take 1 tablet (25 mg total) by mouth daily. 30 tablet 11 Past Week   famotidine  (PEPCID ) 20 MG tablet TAKE 1 TABLET(20 MG) BY MOUTH TWICE DAILY 60 tablet 11 Past Week   ibuprofen (ADVIL) 200 MG tablet Take by mouth.   Past Month   Iron -Vitamin C  65-125 MG TABS Take 1 tablet 3 days a week and 2 tablets 4 days a week for iron  deficiency. 180 tablet 1 Past Week   loratadine  (CLARITIN ) 10 MG tablet Take 1 tablet (10 mg total) by mouth continuous as needed for allergies. 30 tablet 6 Past Week   losartan  (COZAAR ) 25 MG tablet Take 1 tablet (25 mg total) by mouth daily. 90 tablet 3 11/13/2023 at  5:30 AM   meclizine  (ANTIVERT ) 25 MG tablet Take 1 tablet (25 mg total) by mouth 3 (three) times daily as needed for dizziness. 30 tablet 0 Past Week   memantine  (NAMENDA ) 5 MG tablet Take 1 tablet (5 mg total) by mouth 2 (two) times daily. 60 tablet 11 Past Week   metoprolol  tartrate (LOPRESSOR ) 50 MG tablet Take 1 tablet (50 mg total) by mouth 2 (two) times daily. 180 tablet 3 11/13/2023 at  5:30 AM   Multiple Vitamins-Minerals (CENTRUM SILVER  ULTRA WOMENS) TABS Take 1 tablet by mouth daily.   Past Week   simvastatin  (ZOCOR ) 20 MG tablet Take 1 tablet (20 mg total) by mouth at bedtime. 90 tablet 3 11/12/2023   traZODone  (DESYREL ) 50 MG tablet Take 0.5 tablets (25 mg total) by mouth at bedtime. 45 tablet 3 11/12/2023   vitamin B-12 (CYANOCOBALAMIN ) 1000 MCG tablet Take 1,000 mcg by mouth once a week.   Past Week   amiodarone (PACERONE) 200 MG tablet Take by mouth.      dabigatran (PRADAXA) 150 MG CAPS  capsule Take 150 mg by mouth 2 (two) times daily. (Patient not taking: Reported on 10/25/2023)   Not Taking   furosemide  (LASIX ) 20 MG tablet Take 1 tablet (20 mg total) by mouth daily. 30 tablet 0    mupirocin  ointment (BACTROBAN ) 2 % Apply 1 Application topically 2 (two) times daily. For 1-2 weeks on topical for skin infection (Patient not taking: Reported on 11/05/2023) 22 g 0      Allergies  Allergen Reactions   Adhesive [Tape] Rash    Skin became RAW     Past Medical History:  Diagnosis Date   Arthritis    right knee   Atrial fibrillation (HCC)    B12 deficiency    Carotid artery occlusion    Cerebral infarction (HCC)    Dizziness    Esophageal reflux    GERD (gastroesophageal reflux disease)    Heart murmur    History of colon polyps    Hyperlipidemia    Hypertension    Iron  deficiency    Normocytic anemia    Occlusion and stenosis of unspecified carotid artery    Primary osteoarthritis of right knee    Stroke (HCC) 12/31/2012   no residual effects    Review of systems:  Otherwise negative.    Physical Exam  Gen: Alert, oriented. Appears stated age.  HEENT: [place]/AT. PERRLA. Lungs: CTA, no wheezes. CV: RR nl S1, S2. Abd: soft, benign, no masses. BS+ Ext: No edema. Pulses 2+    Planned procedures: Proceed with colonoscopy. The patient understands the nature of the planned procedure, indications, risks, alternatives and potential complications including but not limited to bleeding, infection, perforation, damage to internal organs and possible oversedation/side effects from anesthesia. The patient agrees and gives consent to proceed.  Please refer to procedure notes for findings, recommendations and patient disposition/instructions.     Dayyan Krist K. Corky Diener, M.D. Gastroenterology 11/13/2023  9:19 AM

## 2023-11-14 ENCOUNTER — Encounter: Payer: Self-pay | Admitting: Internal Medicine

## 2023-11-14 LAB — SURGICAL PATHOLOGY

## 2023-12-12 ENCOUNTER — Ambulatory Visit
Admission: RE | Admit: 2023-12-12 | Discharge: 2023-12-12 | Disposition: A | Discharge status: Home / Self Care | Source: Ambulatory Visit | Attending: Physician Assistant | Admitting: Physician Assistant

## 2023-12-12 ENCOUNTER — Other Ambulatory Visit: Payer: Self-pay | Admitting: Physician Assistant

## 2023-12-12 DIAGNOSIS — R410 Disorientation, unspecified: Secondary | ICD-10-CM | POA: Diagnosis not present

## 2023-12-12 DIAGNOSIS — R413 Other amnesia: Secondary | ICD-10-CM

## 2023-12-12 DIAGNOSIS — R441 Visual hallucinations: Secondary | ICD-10-CM | POA: Diagnosis not present

## 2023-12-12 DIAGNOSIS — R0683 Snoring: Secondary | ICD-10-CM | POA: Diagnosis not present

## 2023-12-12 DIAGNOSIS — G479 Sleep disorder, unspecified: Secondary | ICD-10-CM | POA: Diagnosis not present

## 2023-12-12 DIAGNOSIS — R296 Repeated falls: Secondary | ICD-10-CM | POA: Diagnosis not present

## 2023-12-12 DIAGNOSIS — W19XXXS Unspecified fall, sequela: Secondary | ICD-10-CM | POA: Diagnosis not present

## 2023-12-12 NOTE — Progress Notes (Signed)
 Today the history is gathered from: 100% - patient  0% - alone  RECORDS SUMMARY: I have reviewed the note dated 09/19/2022 from Dr. Edman who has indicated:  Memory loss and confusion  Given these abnormal neurologic findings, a referral to neurology has been recommended.  REFERRING PHYSICIAN: Self PRIMARY CARE PHYSICIAN:  Edman Marsa PARAS, DO   IMPRESSION/PLAN  Ms. Tokar is a 73 y.o. female presenting for evaluation of  MEMORY CHANGE/ CONFUSION/ FALLS Assessment & Plan Mild to moderate dementia Decrease in memory score from 25/30 to 16/30, with short-term memory issues affecting driving, finances, and medication management. No significant side effects from Namenda . - Increase Namenda  to 10 mg twice daily. - Advised against over-the-counter memory supplements due to lack of regulation and evidence. - NO driving at this time.  Patient and daughter verbalized understanding.  Will also submit DMV forms. - Consider pharmacy pill packing for easier medication management.  Recommend oversight of medication ministration - Strongly recommend establishing  financial and medical power of attorney  Fall with bruising Recent fall with head injury and rib pain, no loss of consciousness or significant head injury symptoms. On Clopidogrel . - Order head CT without contrast stat to rule out intracranial injury.  Sleep apnea Difficulty with home sleep study equipment. Potential increased risk of stroke, heart attack, and memory problems if untreated. - Arrange for a sleep study at a sleep center.  Atrial fibrillation Uncertainty about current use of amiodarone (Pacerone).  Eliquis has been discontinued, now on Pradaxa. - Contact cardiology to determine if you should still be taking amiodarone - Discussed importance of managing atrial fibrillation to reduce risk of stroke and heart attack. - Continue Plavix  and Pradaxa as directed by cardiology  Follow-up in 3 to 4  months   Medications previously tried: melatonin  CHIEF COMPLAINT & HPI  Ms. Willig is a 73 y.o. female presenting for evaluation of: Chief Complaint  Patient presents with  . MEMORY CHANGE/ CONFUSION/ FALLS    MEMORY CHANGE/ CONFUSION/ FALLS History of Present Illness Drinda Brokaw is a 73 year old female with memory loss who presents for follow-up. She is accompanied by her daughter.  She was last seen one year ago when Namenda  5 mg BID was started, and trazodone  25 mg once daily was continued. Her daughter has expressed concerns about her driving, noting several instances of getting lost, being pulled over for driving the wrong way, and having trouble keeping up with appointments. She has also experienced confusion with basic tasks such as writing checks and has reported that her recollection of past events is becoming fuzzy, with short-term memory being even more affected.  She experienced a fall on Tuesday after attempting to wrap garlic bread in foil. She did not lose consciousness and reported bruising and rib pain but no significant injuries. She lives alone with her dog, and her family checks in regularly. She manages her day-to-day activities but avoids putting pressure on her knees due to replacements. She experiences shortness of breath when bending over or in the heat, attributed to her atrial fibrillation.  She is currently on a blood thinner, Plavix , and manages her medications herself, using a system to ensure she takes them correctly. She has had financial management issues, with her daughter-in-law assisting her. She has not completed the process for power of attorney or financial management documentation.  She reports some difficulty with short-term memory, often confusing names and having trouble with recent events. She has had issues with her MyChart account,  unable to access it despite attempts to reset the password. Her long-term memory is also affected, sometimes confusing  people she knows well.  She has not been using a CPAP machine due to issues with the equipment. She has a history of atrial fibrillation and has been on amiodarone in the past, though she is unsure if she is currently taking it.  Memory score from April 2024 was 25/30.   DATA SUMMARY: 06/04/2023 VAS US  CAROTID   Summary:  Right Carotid: Non-hemodynamically significant plaque <50% noted in the CCA. The                 ECA appears <50% stenosed. The extracranial vessels were                 near-normal with only minimal wall thickening or plaque. Widely                 patent ICA s/p CEA.   Left Carotid: Velocities in the left ICA are consistent with a 1-39% stenosis.                Non-hemodynamically significant plaque <50% noted in the CCA. The                ECA appears <50% stenosed.   Vertebrals:  Bilateral vertebral arteries demonstrate antegrade flow.  Subclavians: Normal flow hemodynamics were seen in bilateral subclavian               arteries.     09/09/2022 CT BRAIN WO CONTRAST IMPRESSION:  No acute intracranial abnormalities.   CT CERVICAL SPINE WO CONTRAST IMPRESSION:  No acute fracture or traumatic listhesis of the cervical spine.  CT LUMBAR SPINE WO CONTRAST CT THORACIC SPINE WO CONTRAST IMPRESSION:  1. No acute fracture or listhesis of the thoracic or lumbar spine. 2. Large hiatal hernia.   08/20/2022 US  VENOUS IMG LOWER BILATERAL IMPRESSION:  Directed duplex of the bilateral lower extremities negative for DVT   VISIT SUMMARIES:   MEDICATIONS Current Outpatient Medications  Medication Sig Dispense Refill  . CALCIUM CARBONATE/VITAMIN D3 (CALCIUM 500 + D, D3, ORAL) Take 1 tablet by mouth once daily       . clopidogreL  (PLAVIX ) 75 mg tablet Take 1 tablet (75 mg total) by mouth once daily 90 tablet 3  . cyanocobalamin  (VITAMIN B12) 1000 MCG tablet Take 1,000 mcg by mouth every other day PATIENT STATES THAT SHE ONLY TAKEs THIS MEDICATION  Monday,wednesday,friday.      . famotidine  (PEPCID ) 20 MG tablet Take 1 tablet (20 mg total) by mouth 2 (two) times daily 30 tablet 3  . ferrous fumarate-vitamin C  (VITRON-C) 65 mg iron - 125 mg tablet Take 1 tablet by mouth    . fluticasone propionate (FLONASE) 50 mcg/actuation nasal spray Place 2 sprays into both nostrils 2 (two) times daily as needed for Rhinitis    . loratadine  (CLARITIN ) 10 mg tablet Take 10 mg by mouth as needed    . losartan  (COZAAR ) 50 MG tablet Take 1 tablet (50 mg total) by mouth once daily 90 tablet 3  . meclizine  (ANTIVERT ) 25 mg tablet Take 1 tablet (25 mg total) by mouth 3 (three) times daily as needed 90 tablet 3  . memantine  (NAMENDA ) 5 MG tablet Take 5 mg once daily for 1 week, then increase to 5 mg twice daily continue 180 tablet 1  . metoprolol  tartrate (LOPRESSOR ) 50 MG tablet Take 1 tablet (50 mg total) by mouth 2 (two) times daily 180  tablet 3  . simvastatin  (ZOCOR ) 20 MG tablet Take 20 mg by mouth nightly       . traZODone  (DESYREL ) 50 MG tablet Take 0.5 tablets (25 mg total) by mouth at bedtime for 30 days 15 tablet 1  . alendronate  (FOSAMAX ) 70 MG tablet TAKE 1 TABLET(70 MG) BY MOUTH 1 TIME A WEEK WITH A FULL GLASS OF WATER AND ON AN EMPTY STOMACH (Patient not taking: Reported on 12/12/2023)    . AMIOdarone (PACERONE) 100 MG tablet Take 1 tablet (100 mg total) by mouth once daily 90 tablet 3  . apixaban (ELIQUIS) 5 mg tablet Take 1 tablet (5 mg total) by mouth every 12 (twelve) hours (Patient not taking: Reported on 09/11/2023) 60 tablet 11  . dabigatran (PRADAXA) 150 mg capsule Take 1 capsule (150 mg total) by mouth 2 (two) times daily (Patient not taking: Reported on 12/12/2023) 180 capsule 3  . eplerenone  (INSPRA ) 25 MG tablet Take 1 tablet (25 mg total) by mouth once daily 30 tablet 11  . peg-electrolyte (NULYTELY) solution MIX AND DRINK AS DIRECTED 4000 mL 0  . rosuvastatin (CRESTOR) 20 MG tablet Take 1 tablet (20 mg total) by mouth once daily 90 tablet 3    No current facility-administered medications for this visit.    ALLERGIES Allergies  Allergen Reactions  . Adhesive Rash    Skin became RAW     EXAM   Vitals:   12/12/23 1301  BP: (!) 142/78  Weight: 88 kg (194 lb)  Height: 162.6 cm (5' 4)  PainSc: 0-No pain    Body mass index is 33.3 kg/m.  Memory score: 10/03/2022 - 25/30. 12/12/23 - 16/30 (SLUMS)   GENERAL: Very pleasant female, no distress. Normocephalic and atraumatic.  Baseline neurological exam below was obtained at prior office visit. Changes from today's visit appear in bold.   EYES: PERRLA EOM's intact  MUSCULOSKELETAL: Bulk - Normal Tone - Normal Pronator Drift - Absent bilaterally. Ambulation - Gait and station is mildly unsteady  Romberg - mildly positive  R/L 5/5    Shoulder abduction (deltoid/supraspinatus, axillary/suprascapular n, C5) 5/5    Elbow flexion (biceps brachii, musculoskeletal n, C5-6) 5/5    Elbow extension (triceps, radial n, C7) 5/5    Finger adduction (interossei, ulnar n, T1)  5/5    Hip flexion (iliopsoas, L1/L2) 5/5    Knee flexion (hamstrings, sciatic n, L5/S1)  5/5    Knee extension (quadriceps, femoral n, L3/4) 5/5    Ankle dorsiflexion (tibialis anterior, deep fibular n, L4/5) 5/5    Ankle plantarflexion (gastroc, tibial n, S1)   NEUROLOGICAL: MENTAL STATUS: Patient is oriented to person, place and time.   Short-term memory is diminished Long-term memory is intact.   Attention span and concentration are intact.   Naming and repetition are intact. Comprehension is intact.   Expressive speech is intact.   Patient's fund of knowledge is within normal limits for educational level.  CRANIAL NERVES: Visual acuity and visual fields are intact         Extraocular muscles are intact                        Facial sensation is intact bilaterally                Facial strength is intact bilaterally                   Hearing is intact bilaterally  Palate elevates midline, normal phonation     Shoulder shrug strength is intact                    Tongue protrudes midline                       SENSATION: Pain and temperature (spinothalamic tracts) is normal. Position and vibration (dorsal columns) is normal.  COORDINATION/CEREBELLAR: Finger to nose testing is intact.  REFLEXES: Negative Hoffman's sign bilaterally.      PAST MEDICAL HISTORY Past Medical History:  Diagnosis Date  . Cerebral artery occlusion with cerebral infarction (CMS/HHS-HCC) 12/31/2012  . Colon polyp   . CVA (cerebral vascular accident) (CMS/HHS-HCC) 2014  . Dizziness   . Esophageal reflux 12/31/2012  . Essential hypertension 12/31/2012  . GERD (gastroesophageal reflux disease)   . Hyperlipidemia   . Occlusion and stenosis of unspecified carotid artery 01/02/2013  . Osteoarthritis   . Stroke (CMS/HHS-HCC)     PAST SURGICAL HISTORY Past Surgical History:  Procedure Laterality Date  . COLONOSCOPY  08/18/2012   Dr. EMERSON Mariner @ [facility] - Tubular Adenoma, rpt 5 yrs per [facility]  . JOINT REPLACEMENT Left 04/15/2017   total mako  . COLONOSCOPY  03/10/2018   Tubular adenoma/Hyperplastic colon polyp/Repeat 33yrs/MUS  . Right TKA using all-cemented [employer] system with a 65 mm PCR femur, a 71 mm tibial tray with a 12 mm anterior stabilized E-poly insert, and a 34 x 8.5 mm all-poly 3-pegged domed patella. Right 12/01/2019   Dr.Poggi   . CAROTID ENDARTERECTOMY    . DILATION & CURRETTAGE    . TUBAL LIGATION    . tubaligation    . UPPER GASTROINTESTINAL ENDOSCOPY      FAMILY HISTORY Family History  Problem Relation Name Age of Onset  . Alzheimer's disease Mother    . Ulcers Father    . Colon cancer Neg Hx    . Colon polyps Neg Hx    . Liver disease Neg Hx    . Rectal cancer Neg Hx      SOCIAL HISTORY  Social History   Tobacco Use  . Smoking status: Never  . Smokeless tobacco: Never  Vaping Use  . Vaping status: Never Used  Substance  Use Topics  . Alcohol use: No  . Drug use: No    REVIEW OF SYSTEMS:  13 system ROS was verbally reviewed with patient. Pertinent positives and negatives are mentioned above in the HPI and all other systems are negative.   DATA    No visits with results within 6 Month(s) from this visit.  Latest known visit with results is:  Appointment on 02/25/2023  Component Date Value Ref Range Status  . Thyroid  Stimulating Hormone (TSH) 02/25/2023 2.712  0.450-5.330 uIU/ml uIU/mL Final  . Glucose 02/25/2023 89  70 - 110 mg/dL Final  . Sodium 90/90/7975 141  136 - 145 mmol/L Final  . Potassium 02/25/2023 4.6  3.6 - 5.1 mmol/L Final  . Chloride 02/25/2023 101  97 - 109 mmol/L Final  . Carbon Dioxide (CO2) 02/25/2023 33.9 (H)  22.0 - 32.0 mmol/L Final  . Calcium 02/25/2023 9.6  8.7 - 10.3 mg/dL Final  . Urea Nitrogen (BUN) 02/25/2023 12  7 - 25 mg/dL Final  . Creatinine 90/90/7975 0.9  0.6 - 1.1 mg/dL Final  . Glomerular Filtration Rate (eGFR) 02/25/2023 68  >60 mL/min/1.73sq m Final  . BUN/Crea Ratio 02/25/2023 13.3  6.0 - 20.0 Final  .  Anion Gap w/K 02/25/2023 10.7  6.0 - 16.0 Final      No follow-ups on file.  Payor: [employer] / Plan: [employer] / Product Type: PPO /    MEGAN SIMPSON, CMA   I agree that the scribe documentation is complete and accurate.  This note was generated in part with voice recognition software and I apologize for any typographical errors that were not detected and corrected.     Attestation Statement:   I personally performed the service, non-incident to. [employer])   SARAH ELIZABETH MASON, PA       This note has been created using automated tools and reviewed for accuracy by [facility].   I have spent a total of 50 minutes in both face-to-face and non face-to-face activities for this visit on the date of this encounter.

## 2024-02-10 ENCOUNTER — Ambulatory Visit: Attending: Internal Medicine

## 2024-02-10 DIAGNOSIS — G4761 Periodic limb movement disorder: Secondary | ICD-10-CM | POA: Diagnosis not present

## 2024-02-10 DIAGNOSIS — G4733 Obstructive sleep apnea (adult) (pediatric): Secondary | ICD-10-CM | POA: Insufficient documentation

## 2024-02-17 DIAGNOSIS — G4730 Sleep apnea, unspecified: Secondary | ICD-10-CM | POA: Diagnosis not present

## 2024-03-12 DIAGNOSIS — I10 Essential (primary) hypertension: Secondary | ICD-10-CM | POA: Diagnosis not present

## 2024-03-12 DIAGNOSIS — R0602 Shortness of breath: Secondary | ICD-10-CM | POA: Diagnosis not present

## 2024-03-12 DIAGNOSIS — E785 Hyperlipidemia, unspecified: Secondary | ICD-10-CM | POA: Diagnosis not present

## 2024-03-12 DIAGNOSIS — I480 Paroxysmal atrial fibrillation: Secondary | ICD-10-CM | POA: Diagnosis not present

## 2024-03-12 DIAGNOSIS — E66811 Obesity, class 1: Secondary | ICD-10-CM | POA: Diagnosis not present

## 2024-03-12 DIAGNOSIS — I5022 Chronic systolic (congestive) heart failure: Secondary | ICD-10-CM | POA: Diagnosis not present

## 2024-03-12 DIAGNOSIS — I429 Cardiomyopathy, unspecified: Secondary | ICD-10-CM | POA: Diagnosis not present

## 2024-03-12 DIAGNOSIS — Z01818 Encounter for other preprocedural examination: Secondary | ICD-10-CM | POA: Diagnosis not present

## 2024-03-12 DIAGNOSIS — I6350 Cerebral infarction due to unspecified occlusion or stenosis of unspecified cerebral artery: Secondary | ICD-10-CM | POA: Diagnosis not present

## 2024-03-12 DIAGNOSIS — K219 Gastro-esophageal reflux disease without esophagitis: Secondary | ICD-10-CM | POA: Diagnosis not present

## 2024-03-12 DIAGNOSIS — I679 Cerebrovascular disease, unspecified: Secondary | ICD-10-CM | POA: Diagnosis not present

## 2024-04-01 ENCOUNTER — Ambulatory Visit: Attending: Internal Medicine

## 2024-04-01 DIAGNOSIS — G4733 Obstructive sleep apnea (adult) (pediatric): Secondary | ICD-10-CM | POA: Insufficient documentation

## 2024-04-01 DIAGNOSIS — R0683 Snoring: Secondary | ICD-10-CM | POA: Insufficient documentation

## 2024-04-08 DIAGNOSIS — R410 Disorientation, unspecified: Secondary | ICD-10-CM | POA: Diagnosis not present

## 2024-04-08 DIAGNOSIS — R0683 Snoring: Secondary | ICD-10-CM | POA: Diagnosis not present

## 2024-04-08 DIAGNOSIS — G479 Sleep disorder, unspecified: Secondary | ICD-10-CM | POA: Diagnosis not present

## 2024-04-08 DIAGNOSIS — R441 Visual hallucinations: Secondary | ICD-10-CM | POA: Diagnosis not present

## 2024-04-08 DIAGNOSIS — R413 Other amnesia: Secondary | ICD-10-CM | POA: Diagnosis not present

## 2024-04-08 DIAGNOSIS — W19XXXS Unspecified fall, sequela: Secondary | ICD-10-CM | POA: Diagnosis not present

## 2024-04-14 DIAGNOSIS — I10 Essential (primary) hypertension: Secondary | ICD-10-CM | POA: Diagnosis not present

## 2024-04-14 DIAGNOSIS — Z7902 Long term (current) use of antithrombotics/antiplatelets: Secondary | ICD-10-CM | POA: Diagnosis not present

## 2024-04-14 DIAGNOSIS — F03B2 Unspecified dementia, moderate, with psychotic disturbance: Secondary | ICD-10-CM | POA: Diagnosis not present

## 2024-04-14 DIAGNOSIS — Z860100 Personal history of colon polyps, unspecified: Secondary | ICD-10-CM | POA: Diagnosis not present

## 2024-04-14 DIAGNOSIS — Z7983 Long term (current) use of bisphosphonates: Secondary | ICD-10-CM | POA: Diagnosis not present

## 2024-04-14 DIAGNOSIS — E785 Hyperlipidemia, unspecified: Secondary | ICD-10-CM | POA: Diagnosis not present

## 2024-04-14 DIAGNOSIS — K219 Gastro-esophageal reflux disease without esophagitis: Secondary | ICD-10-CM | POA: Diagnosis not present

## 2024-04-14 DIAGNOSIS — Z9181 History of falling: Secondary | ICD-10-CM | POA: Diagnosis not present

## 2024-04-14 DIAGNOSIS — F03B18 Unspecified dementia, moderate, with other behavioral disturbance: Secondary | ICD-10-CM | POA: Diagnosis not present

## 2024-04-14 DIAGNOSIS — K449 Diaphragmatic hernia without obstruction or gangrene: Secondary | ICD-10-CM | POA: Diagnosis not present

## 2024-04-14 DIAGNOSIS — Z96653 Presence of artificial knee joint, bilateral: Secondary | ICD-10-CM | POA: Diagnosis not present

## 2024-04-14 DIAGNOSIS — Z79899 Other long term (current) drug therapy: Secondary | ICD-10-CM | POA: Diagnosis not present

## 2024-04-14 DIAGNOSIS — Z8673 Personal history of transient ischemic attack (TIA), and cerebral infarction without residual deficits: Secondary | ICD-10-CM | POA: Diagnosis not present

## 2024-04-14 DIAGNOSIS — Z7901 Long term (current) use of anticoagulants: Secondary | ICD-10-CM | POA: Diagnosis not present

## 2024-04-14 DIAGNOSIS — G4733 Obstructive sleep apnea (adult) (pediatric): Secondary | ICD-10-CM | POA: Diagnosis not present

## 2024-04-24 DIAGNOSIS — E785 Hyperlipidemia, unspecified: Secondary | ICD-10-CM | POA: Diagnosis not present

## 2024-04-24 DIAGNOSIS — G4733 Obstructive sleep apnea (adult) (pediatric): Secondary | ICD-10-CM | POA: Diagnosis not present

## 2024-04-24 DIAGNOSIS — I10 Essential (primary) hypertension: Secondary | ICD-10-CM | POA: Diagnosis not present

## 2024-04-24 DIAGNOSIS — F03B2 Unspecified dementia, moderate, with psychotic disturbance: Secondary | ICD-10-CM | POA: Diagnosis not present

## 2024-04-24 DIAGNOSIS — F03B18 Unspecified dementia, moderate, with other behavioral disturbance: Secondary | ICD-10-CM | POA: Diagnosis not present

## 2024-04-24 DIAGNOSIS — K219 Gastro-esophageal reflux disease without esophagitis: Secondary | ICD-10-CM | POA: Diagnosis not present

## 2024-04-24 DIAGNOSIS — K449 Diaphragmatic hernia without obstruction or gangrene: Secondary | ICD-10-CM | POA: Diagnosis not present

## 2024-05-07 ENCOUNTER — Ambulatory Visit: Admitting: Family Medicine

## 2024-05-07 ENCOUNTER — Encounter: Payer: Self-pay | Admitting: Family Medicine

## 2024-05-07 ENCOUNTER — Other Ambulatory Visit: Payer: Self-pay | Admitting: Family Medicine

## 2024-05-07 VITALS — BP 132/84 | HR 56 | Ht 64.0 in | Wt 181.4 lb

## 2024-05-07 DIAGNOSIS — R413 Other amnesia: Secondary | ICD-10-CM

## 2024-05-07 DIAGNOSIS — I5020 Unspecified systolic (congestive) heart failure: Secondary | ICD-10-CM | POA: Diagnosis not present

## 2024-05-07 DIAGNOSIS — H8113 Benign paroxysmal vertigo, bilateral: Secondary | ICD-10-CM | POA: Diagnosis not present

## 2024-05-07 DIAGNOSIS — R7309 Other abnormal glucose: Secondary | ICD-10-CM

## 2024-05-07 DIAGNOSIS — I10 Essential (primary) hypertension: Secondary | ICD-10-CM

## 2024-05-07 DIAGNOSIS — I6930 Unspecified sequelae of cerebral infarction: Secondary | ICD-10-CM | POA: Diagnosis not present

## 2024-05-07 DIAGNOSIS — Z0000 Encounter for general adult medical examination without abnormal findings: Secondary | ICD-10-CM

## 2024-05-07 DIAGNOSIS — E669 Obesity, unspecified: Secondary | ICD-10-CM

## 2024-05-07 DIAGNOSIS — I4819 Other persistent atrial fibrillation: Secondary | ICD-10-CM

## 2024-05-07 DIAGNOSIS — F01A0 Vascular dementia, mild, without behavioral disturbance, psychotic disturbance, mood disturbance, and anxiety: Secondary | ICD-10-CM | POA: Diagnosis not present

## 2024-05-07 DIAGNOSIS — D649 Anemia, unspecified: Secondary | ICD-10-CM

## 2024-05-07 DIAGNOSIS — E559 Vitamin D deficiency, unspecified: Secondary | ICD-10-CM

## 2024-05-07 DIAGNOSIS — F5104 Psychophysiologic insomnia: Secondary | ICD-10-CM

## 2024-05-07 DIAGNOSIS — E785 Hyperlipidemia, unspecified: Secondary | ICD-10-CM

## 2024-05-07 LAB — POCT GLYCOSYLATED HEMOGLOBIN (HGB A1C): Hemoglobin A1C: 4.9 % (ref 4.0–5.6)

## 2024-05-07 NOTE — Patient Instructions (Addendum)
 Thank you for coming to the office today.  Contact if / when you need labs.  1. You have symptoms of Vertigo (Benign Paroxysmal Positional Vertigo) - This is commonly caused by inner ear fluid imbalance, sometimes can be worsened by allergies and sinus symptoms, otherwise it can occur randomly sometimes and we may never discover the exact cause. - To treat this, try the Epley Manuever (see diagrams/instructions below) at home up to 3 times a day for 1-2 weeks or until symptoms resolve   If you develop significant worsening episode with vertigo that does not improve and you get severe headache, loss of vision, arm or leg weakness, slurred speech, or other concerning symptoms please seek immediate medical attention at Emergency Department.   See the next page for images describing the Epley Manuever.     ----------------------------------------------------------------------------------------------------------------------       DUE for FASTING BLOOD WORK (no food or drink after midnight before the lab appointment, only water or coffee without cream/sugar on the morning of)  SCHEDULE Lab Only visit in the morning at the clinic for lab draw in 6 MONTHS   - Make sure Lab Only appointment is at about 1 week before your next appointment, so that results will be available  For Lab Results, once available within 2-3 days of blood draw, you can can log in to MyChart online to view your results and a brief explanation. Also, we can discuss results at next follow-up visit.   Please schedule a Follow-up Appointment to: Return in about 6 months (around 11/04/2024) for 6 month fasting lab > 1 week later Annual Physical.  If you have any other questions or concerns, please feel free to call the office or send a message through MyChart. You may also schedule an earlier appointment if necessary.  Additionally, you may be receiving a survey about your experience at our office within a few days to 1  week by e-mail or mail. We value your feedback.  Marsa Officer, DO [facility], [place]

## 2024-05-07 NOTE — Progress Notes (Signed)
 Subjective:    Patient ID: Ellen Baker, female    DOB: 1950/08/02, 73 y.o.   MRN: [id number]  Ellen Baker is a 73 y.o. female presenting on 05/07/2024 for Medical Management of Chronic Issues   HPI  Discussed the use of AI scribe software for clinical note transcription with the patient, who gave verbal consent to proceed.  History of Present Illness   Ellen Baker is a 73 year old female with hypertension and systolic heart failure who presents for a six-month follow-up.  Hypertension - Home blood pressure readings consistently around 120/80 mmHg - Blood pressure during today's visit was elevated - Blood pressure at neurologist's office: 142/78 mmHg - Blood pressure at cardiologist's office: 120/70 mmHg  Positional vertigo - Room-spinning vertigo when waking up and sitting on the edge of the bed - Vertigo is intermittent and associated with positional changes - Onset of vertigo after a trip to the beach  Obstructive sleep apnea - Currently using CPAP machine CPAP not working with equipment, [facility] - CPAP mask slips and does not stay in place, causing it to move across the bed  Elevated A1c - Hemoglobin A1c improved to 4.9% from previous 5.7% (previously in prediabetic range) Improving diet   HYPERLIPIDEMIA: Her total cholesterol is 220 and LDL is 134. She has been out of simvastatin , which she takes at night, and plans to resume it.  - Currently taking Simvastatin  20mg , tolerating well without side effects or myalgias   CHF, HFrEF Atrial Fibrillation persistent Followed by [facility]iology Dr Florencio On Amiodarone and Metoprolol  Last [facility]ardiogram 04/16/22 On Eliquis 5mg  TWICE A DAY On Plavix  On Eplerenone  25mg  daily, request re order today On BB Metoprolol , Simvastatin     Anemia Last lab shows Hgb 13.4, stable from prior   S/p bilateral cataract surgeries .   Health Maintenance:   Colonoscopy 11/13/23 with Dr  Aundria [facility] [place] Polyps removed TBD repeat screening per GI rec   Mammogram last done 05/2023, negative. Repeat yearly   Due Prevnar-20 vaccine, at pharmacy if interested   Considering Shingrix Vaccine.        05/07/2024   10:52 AM 11/05/2023    9:21 AM 07/19/2023    2:10 PM  Depression screen PHQ 2/9  Decreased Interest 0 1 0  Down, Depressed, Hopeless 0 0 0  PHQ - 2 Score 0 1 0  Altered sleeping 1 0 0  Tired, decreased energy 0 0 0  Change in appetite 0 0 0  Feeling bad or failure about yourself  0 0 0  Trouble concentrating 0 0 0  Moving slowly or fidgety/restless 0 0 0  Suicidal thoughts 0 0 0  PHQ-9 Score 1 1  0   Difficult doing work/chores  Somewhat difficult Not difficult at all     Data saved with a previous flowsheet row definition       05/07/2024   10:52 AM 11/05/2023    9:21 AM 10/29/2022    3:21 PM 07/31/2022   10:03 AM  GAD 7 : Generalized Anxiety Score  Nervous, Anxious, on Edge 0 0 0 0  Control/stop worrying 1 0 0 0  Worry too much - different things 0 0 0 0  Trouble relaxing 0 0 0 0  Restless 0 0 0 0  Easily annoyed or irritable 0 0 0 0  Afraid - awful might happen 0 0 0 0  Total GAD 7 Score 1 0 0 0  Anxiety Difficulty  Somewhat difficult Somewhat difficult  Not difficult at all    Social History   Tobacco Use   Smoking status: Never   Smokeless tobacco: Never   Tobacco comments:    Smoking cessation materials not required  Vaping Use   Vaping status: Never Used  Substance Use Topics   Alcohol use: Not Currently    Alcohol/week: 1.0 standard drink of alcohol    Types: 1 Standard drinks or equivalent per week    Comment: Yearly   Drug use: No    Review of Systems  Constitutional:  Negative for activity change, appetite change, chills, diaphoresis, fatigue and fever.  HENT:  Negative for congestion and hearing loss.   Eyes:  Negative for visual disturbance.  Respiratory:  Negative for cough, chest tightness, shortness of  breath and wheezing.   Cardiovascular:  Negative for chest pain, palpitations and leg swelling.  Gastrointestinal:  Negative for abdominal pain, constipation, diarrhea, nausea and vomiting.  Genitourinary:  Negative for dysuria, frequency and hematuria.  Musculoskeletal:  Negative for arthralgias and neck pain.  Skin:  Negative for rash.  Neurological:  Positive for dizziness. Negative for weakness, light-headedness, numbness and headaches.  Hematological:  Negative for adenopathy.  Psychiatric/Behavioral:  Positive for decreased concentration. Negative for behavioral problems, confusion, dysphoric mood and sleep disturbance.        Memory loss   Per HPI unless specifically indicated above     Objective:    BP 132/84 (BP Location: Left Arm, Patient Position: Sitting, Cuff Size: Normal)   Pulse (!) 56   Ht 5' 4 (1.626 m)   Wt 181 lb 6 oz (82.3 kg)   SpO2 98%   BMI 31.13 kg/m   Wt Readings from Last 3 Encounters:  05/07/24 181 lb 6 oz (82.3 kg)  11/13/23 190 lb 3.2 oz (86.3 kg)  11/05/23 192 lb (87.1 kg)    Physical Exam Vitals and nursing note reviewed.  Constitutional:      General: She is not in acute distress.    Appearance: She is well-developed. She is not diaphoretic.     Comments: Well-appearing, comfortable, cooperative  HENT:     Head: Normocephalic and atraumatic.  Eyes:     General:        Right eye: No discharge.        Left eye: No discharge.     Conjunctiva/sclera: Conjunctivae normal.     Pupils: Pupils are equal, round, and reactive to light.  Neck:     Thyroid : No thyromegaly.  Cardiovascular:     Rate and Rhythm: Normal rate. Rhythm irregular.     Pulses: Normal pulses.     Heart sounds: Normal heart sounds. No murmur heard. Pulmonary:     Effort: Pulmonary effort is normal. No respiratory distress.     Breath sounds: Normal breath sounds. No wheezing or rales.  Abdominal:     General: Bowel sounds are normal. There is no distension.      Palpations: Abdomen is soft. There is no mass.     Tenderness: There is no abdominal tenderness.  Musculoskeletal:        General: No tenderness. Normal range of motion.     Cervical back: Normal range of motion and neck supple.     Right lower leg: No edema.     Left lower leg: No edema.     Comments: Upper / Lower Extremities: - Normal muscle tone, strength bilateral upper extremities 5/5, lower extremities 5/5  Lymphadenopathy:  Cervical: No cervical adenopathy.  Skin:    General: Skin is warm and dry.     Findings: No erythema or rash.  Neurological:     Mental Status: She is alert and oriented to person, place, and time.     Comments: Distal sensation intact to light touch all extremities  Psychiatric:        Mood and Affect: Mood normal.        Behavior: Behavior normal.        Thought Content: Thought content normal.     Comments: Well groomed, good eye contact, normal speech and thoughts     Results for orders placed or performed in visit on 05/07/24  POCT HgB A1C   Collection Time: 05/07/24 10:58 AM  Result Value Ref Range   Hemoglobin A1C 4.9 4.0 - 5.6 %   HbA1c POC (<> result, manual entry)     HbA1c, POC (prediabetic range)     HbA1c, POC (controlled diabetic range)        Assessment & Plan:   Problem List Items Addressed This Visit     Essential hypertension   HFrEF (heart failure with reduced ejection fraction) (HCC) - Primary   History of stroke with residual effects   Mild vascular dementia without behavioral disturbance, psychotic disturbance, mood disturbance, or anxiety (HCC)   Relevant Medications   memantine  (NAMENDA ) 10 MG tablet   Persistent atrial fibrillation (HCC)   Other Visit Diagnoses       Abnormal glucose       Relevant Orders   POCT HgB A1C (Completed)     Benign paroxysmal positional vertigo due to bilateral vestibular disorder         Memory loss       Relevant Medications   memantine  (NAMENDA ) 10 MG tablet     Obesity (BMI  30-39.9)            Pre-Diabetes A1c is 4.9, indicating excellent glycemic control. - Continue current management plan. Lifestyle modification  Hypertension Blood pressure readings variable, current reading 132/84 within acceptable range. - Continue monitoring blood pressure at home and during visits.  HFrEF Paroxysmal Atrial Fibrillation Systolic heart failure and atrial fibrillation Followed by [facility] Cardiology No fluid retention issues. Regular cardiology monitoring. No recent medication changes. - Continue current heart failure and atrial fibrillation management plan. On Plavix , BB, ARB, Statin, Amio  Benign paroxysmal positional vertigo Positional vertigo likely due to inner ear crystal imbalance. - Provided home exercises for vertigo management. Epley Maneuever print AVS Possible secondary to CVA history w/ residual deficits  Mild Vascular Dementia Chronic small vessel Cerebrovascular disease Followed by [facility] Neurology Continues on Namenda  for cognitive support. Not driving.  Obstructive sleep apnea Auto CPAP Current CPAP device not well tolerated due to fit issues. - Contact CPAP provider for device adjustment or replacement.  General Health Maintenance Plans to receive flu, pneumonia, and RSV vaccines. Declines shingles vaccine. - Receive flu, pneumonia, and RSV vaccines at pharmacy. - Update records with vaccination status.        Orders Placed This Encounter  Procedures   POCT HgB A1C    No orders of the defined types were placed in this encounter.   Follow up plan: Return in about 6 months (around 11/04/2024) for 6 month fasting lab > 1 week later Annual Physical.  Future labs ordered for 11/04/24   Marsa Officer, DO [facility] 05/07/2024, 11:02 AM

## 2024-05-10 ENCOUNTER — Other Ambulatory Visit: Payer: Self-pay | Admitting: Family Medicine

## 2024-05-10 DIAGNOSIS — K219 Gastro-esophageal reflux disease without esophagitis: Secondary | ICD-10-CM

## 2024-05-12 NOTE — Telephone Encounter (Signed)
 Requested Prescriptions  Pending Prescriptions Disp Refills   famotidine  (PEPCID ) 20 MG tablet [Pharmacy Med Name: FAMOTIDINE  20MG  TABLETS] 180 tablet 0    Sig: TAKE 1 TABLET(20 MG) BY MOUTH TWICE DAILY     Gastroenterology:  H2 Antagonists Passed - 05/12/2024  8:09 AM      Passed - Valid encounter within last 12 months    Recent Outpatient Visits           5 days ago HFrEF (heart failure with reduced ejection fraction) [facility])   [place] [facility] [place], Marsa PARAS, DO   6 months ago Annual physical exam   [facility] [place], Marsa PARAS, [place]

## 2024-05-18 ENCOUNTER — Encounter: Payer: Self-pay | Admitting: Pharmacist

## 2024-05-18 NOTE — Progress Notes (Signed)
   05/18/2024  Patient ID: Ellen Baker Strength, female   DOB: 1950-10-06, 73 y.o.   MRN: [id number]  This patient is appearing on a report for the adherence measure for hypertension (ACEi/ARB) medications this calendar year.   Medication: losartan  50 mg Last fill date: 05/05/2024 for 90 day supply  Insurance report was not up to date. No action needed at this time.   Sharyle Sia, PharmD, [facility] [phone]

## 2024-05-22 DIAGNOSIS — H26493 Other secondary cataract, bilateral: Secondary | ICD-10-CM | POA: Diagnosis not present

## 2024-05-22 DIAGNOSIS — Z961 Presence of intraocular lens: Secondary | ICD-10-CM | POA: Diagnosis not present

## 2024-05-22 DIAGNOSIS — H18593 Other hereditary corneal dystrophies, bilateral: Secondary | ICD-10-CM | POA: Diagnosis not present

## 2024-05-28 ENCOUNTER — Telehealth: Payer: Self-pay | Admitting: Family Medicine

## 2024-05-28 DIAGNOSIS — R413 Other amnesia: Secondary | ICD-10-CM

## 2024-05-29 ENCOUNTER — Encounter: Payer: Self-pay | Admitting: Urgent Care

## 2024-05-29 NOTE — Telephone Encounter (Signed)
 Requested medication (s) are due for refill today: yes  Requested medication (s) are on the active medication list: no  Last refill:    Future visit scheduled: no  Notes to clinic:  historical medication     Requested Prescriptions  Pending Prescriptions Disp Refills   memantine  (NAMENDA ) 5 MG tablet [Pharmacy Med Name: MEMANTINE  5MG  TABLETS] 60 tablet 11    Sig: TAKE 1 TABLET(5 MG) BY MOUTH TWICE DAILY     Neurology:  Alzheimer's Agents 2 Passed - 05/29/2024  4:18 PM      Passed - Cr in normal range and within 360 days    Creat  Date Value Ref Range Status  10/29/2023 0.89 0.60 - 1.00 mg/dL Final         Passed - eGFR is 5 or above and within 360 days    EGFR (African American)  Date Value Ref Range Status  12/31/2012 >60  Final   GFR calc Af Amer  Date Value Ref Range Status  07/19/2020 102 >59 mL/min/1.73 Final    Comment:    **In accordance with recommendations from the NKF-ASN Task force,**   [facility] is in the process of updating its eGFR calculation to the   2021 CKD-EPI creatinine equation that estimates kidney function   without a race variable.    EGFR (Non-African Amer.)  Date Value Ref Range Status  12/31/2012 >60  Final    Comment:    eGFR values <3mL/min/1.73 m2 may be an indication of chronic kidney disease (CKD). Calculated eGFR is useful in patients with stable renal function. The eGFR calculation will not be reliable in acutely ill patients when serum creatinine is changing rapidly. It is not useful in  patients on dialysis. The eGFR calculation may not be applicable to patients at the low and high extremes of body sizes, pregnant women, and vegetarians.    GFR, Estimated  Date Value Ref Range Status  10/25/2023 56 (L) >60 mL/min Final    Comment:    (NOTE) Calculated using the CKD-EPI Creatinine Equation (2021)    eGFR  Date Value Ref Range Status  10/23/2022 76 > OR = 60 mL/min/1.35m2 Final         Passed - Valid encounter within  last 6 months    Recent Outpatient Visits           3 weeks ago HFrEF (heart failure with reduced ejection fraction) [facility])   [facility] [place], Marsa PARAS, DO   6 months ago Annual physical exam    [facility] [place], Marsa PARAS, [place]

## 2024-06-01 ENCOUNTER — Other Ambulatory Visit: Payer: Self-pay | Admitting: Family Medicine

## 2024-06-01 NOTE — Telephone Encounter (Signed)
 It looks like refill request came in for Namenda  5mg  twice a day. Normally this has been ordered by Neurology it seems also her chart says it used to be 5mg  twice a day and then it was increased to 10mg  twice a day.  Can you call patient to find out status of the Namenda ? Does she take 5 or 10mg , and does she need us  to help refill it or is her Neurology team ordering it?  Marsa Officer, DO [facility] 06/01/2024, 5:02 PM

## 2024-06-02 NOTE — Telephone Encounter (Signed)
 Tried calling patient no answer or VM    If she returns call please find out the answer to Dr edman questions

## 2024-06-04 ENCOUNTER — Other Ambulatory Visit (INDEPENDENT_AMBULATORY_CARE_PROVIDER_SITE_OTHER): Payer: Self-pay | Admitting: Vascular Surgery

## 2024-06-04 DIAGNOSIS — I6523 Occlusion and stenosis of bilateral carotid arteries: Secondary | ICD-10-CM

## 2024-06-05 ENCOUNTER — Ambulatory Visit (INDEPENDENT_AMBULATORY_CARE_PROVIDER_SITE_OTHER): Payer: PPO | Admitting: Vascular Surgery

## 2024-06-05 ENCOUNTER — Encounter (INDEPENDENT_AMBULATORY_CARE_PROVIDER_SITE_OTHER): Payer: PPO

## 2024-06-15 ENCOUNTER — Other Ambulatory Visit: Payer: Self-pay | Admitting: Pharmacist

## 2024-06-15 DIAGNOSIS — E785 Hyperlipidemia, unspecified: Secondary | ICD-10-CM

## 2024-06-15 NOTE — Progress Notes (Signed)
" ° °  06/15/2024  Patient ID: Ellen Baker, female   DOB: [birth year], 73 y.o.   MRN: [id number]  This patient is appearing on a report for the adherence measure for cholesterol (statin) medications this calendar year.   Medication: simvastatin  20 mg Last fill date: 03/22/2024 for 90 day supply  Denies currently using weekly pillbox to organize her medications, rather currently takes her medications directly from pill bottles. Denies missed doses  Identify patient has <2 week supply of her simvastatin  remaining. States that her daughter is going to pick up medications for her from pharmacy today and would like for her to pick up a refill of simvastatin  as well.  Contact [facility] on behalf of patient today to request pharmacy refill her simvastatin  prescription  Encourage to consider restart using weekly pillbox  Reports that she was advised by [employer] that [facility] will no longer be in plan's preferred network in 2026 - Patient plans to switch to [facility] (preferred in 2026). Counsel patient on process for changing pharmacies  Sharyle Sia, PharmD, [facility] [phone]  "

## 2024-06-27 ENCOUNTER — Encounter: Payer: Self-pay | Admitting: Urgent Care

## 2024-07-21 ENCOUNTER — Encounter: Payer: Self-pay | Admitting: Urgent Care

## 2024-07-21 ENCOUNTER — Ambulatory Visit (INDEPENDENT_AMBULATORY_CARE_PROVIDER_SITE_OTHER): Admitting: Vascular Surgery

## 2024-07-21 ENCOUNTER — Other Ambulatory Visit (INDEPENDENT_AMBULATORY_CARE_PROVIDER_SITE_OTHER)

## 2024-07-21 VITALS — BP 177/116 | HR 52 | Ht 64.0 in | Wt 180.0 lb

## 2024-07-21 DIAGNOSIS — E785 Hyperlipidemia, unspecified: Secondary | ICD-10-CM

## 2024-07-21 DIAGNOSIS — I6521 Occlusion and stenosis of right carotid artery: Secondary | ICD-10-CM

## 2024-07-21 DIAGNOSIS — I4819 Other persistent atrial fibrillation: Secondary | ICD-10-CM | POA: Diagnosis not present

## 2024-07-21 DIAGNOSIS — I10 Essential (primary) hypertension: Secondary | ICD-10-CM | POA: Diagnosis not present

## 2024-07-21 DIAGNOSIS — I6523 Occlusion and stenosis of bilateral carotid arteries: Secondary | ICD-10-CM

## 2024-07-29 ENCOUNTER — Ambulatory Visit

## 2024-10-20 ENCOUNTER — Other Ambulatory Visit

## 2024-10-23 ENCOUNTER — Ambulatory Visit: Admitting: Internal Medicine

## 2024-10-23 ENCOUNTER — Ambulatory Visit

## 2024-11-04 ENCOUNTER — Other Ambulatory Visit

## 2024-11-11 ENCOUNTER — Encounter: Admitting: Family Medicine

## 2025-07-20 ENCOUNTER — Ambulatory Visit (INDEPENDENT_AMBULATORY_CARE_PROVIDER_SITE_OTHER): Admitting: Vascular Surgery

## 2025-07-20 ENCOUNTER — Encounter (INDEPENDENT_AMBULATORY_CARE_PROVIDER_SITE_OTHER)
